# Patient Record
Sex: Female | Born: 1935 | Race: White | Hispanic: No | Smoking: Never smoker
Health system: Southern US, Community
[De-identification: ages and names within clinical notes are randomized; demographics above are authoritative.]

## PROBLEM LIST (undated history)

## (undated) DIAGNOSIS — E039 Hypothyroidism, unspecified: Secondary | ICD-10-CM

## (undated) DIAGNOSIS — H409 Unspecified glaucoma: Secondary | ICD-10-CM

## (undated) DIAGNOSIS — R112 Nausea with vomiting, unspecified: Secondary | ICD-10-CM

## (undated) DIAGNOSIS — Z923 Personal history of irradiation: Secondary | ICD-10-CM

## (undated) DIAGNOSIS — N3281 Overactive bladder: Secondary | ICD-10-CM

## (undated) DIAGNOSIS — E785 Hyperlipidemia, unspecified: Secondary | ICD-10-CM

## (undated) DIAGNOSIS — I10 Essential (primary) hypertension: Secondary | ICD-10-CM

## (undated) DIAGNOSIS — J189 Pneumonia, unspecified organism: Secondary | ICD-10-CM

## (undated) DIAGNOSIS — C44320 Squamous cell carcinoma of skin of unspecified parts of face: Secondary | ICD-10-CM

## (undated) DIAGNOSIS — N1831 Chronic kidney disease, stage 3a: Secondary | ICD-10-CM

## (undated) DIAGNOSIS — Z9889 Other specified postprocedural states: Secondary | ICD-10-CM

## (undated) DIAGNOSIS — C50919 Malignant neoplasm of unspecified site of unspecified female breast: Secondary | ICD-10-CM

## (undated) HISTORY — PX: CATARACT EXTRACTION W/ INTRAOCULAR LENS  IMPLANT, BILATERAL: SHX1307

## (undated) HISTORY — PX: BREAST SURGERY: SHX581

## (undated) HISTORY — PX: TOTAL ABDOMINAL HYSTERECTOMY: SHX209

## (undated) HISTORY — PX: ABDOMINAL HYSTERECTOMY: SHX81

## (undated) HISTORY — DX: Hyperlipidemia, unspecified: E78.5

## (undated) HISTORY — DX: Unspecified glaucoma: H40.9

## (undated) HISTORY — PX: SURGICAL EXCISION OF EXCESSIVE SKIN: SHX6542

## (undated) HISTORY — DX: Essential (primary) hypertension: I10

## (undated) HISTORY — DX: Overactive bladder: N32.81

## (undated) HISTORY — DX: Hypothyroidism, unspecified: E03.9

## (undated) HISTORY — DX: Squamous cell carcinoma of skin of unspecified parts of face: C44.320

---

## 1980-03-29 HISTORY — PX: PARTIAL HYSTERECTOMY: SHX80

## 1982-03-29 HISTORY — PX: THYROIDECTOMY, PARTIAL: SHX18

## 2010-03-05 ENCOUNTER — Ambulatory Visit: Payer: Self-pay | Admitting: Family Medicine

## 2011-02-05 ENCOUNTER — Ambulatory Visit: Payer: Self-pay | Admitting: Family Medicine

## 2011-03-08 ENCOUNTER — Ambulatory Visit: Payer: Self-pay | Admitting: Family Medicine

## 2012-03-08 ENCOUNTER — Ambulatory Visit: Payer: Self-pay | Admitting: Family Medicine

## 2013-02-08 ENCOUNTER — Emergency Department: Payer: Self-pay | Admitting: Emergency Medicine

## 2013-02-08 LAB — COMPREHENSIVE METABOLIC PANEL
Albumin: 3.7 g/dL (ref 3.4–5.0)
Alkaline Phosphatase: 75 U/L (ref 50–136)
Anion Gap: 7 (ref 7–16)
BUN: 15 mg/dL (ref 7–18)
Bilirubin,Total: 0.7 mg/dL (ref 0.2–1.0)
Chloride: 98 mmol/L (ref 98–107)
Co2: 27 mmol/L (ref 21–32)
Creatinine: 0.84 mg/dL (ref 0.60–1.30)
EGFR (African American): >60
EGFR (Non-African Amer.): >60
Glucose: 129 mg/dL — ABNORMAL HIGH (ref 65–99)
SGOT(AST): 22 U/L (ref 15–37)
SGPT (ALT): 20 U/L (ref 12–78)
Sodium: 132 mmol/L — ABNORMAL LOW (ref 136–145)

## 2013-02-08 LAB — URINALYSIS, COMPLETE
Bacteria: NONE SEEN
Bilirubin,UR: NEGATIVE
Glucose,UR: NEGATIVE mg/dL (ref 0–75)
Nitrite: NEGATIVE
Ph: 7 (ref 4.5–8.0)
Protein: 30
RBC,UR: 29 /HPF (ref 0–5)
Specific Gravity: 1.017 (ref 1.003–1.030)
Squamous Epithelial: 10

## 2013-02-08 LAB — CBC
HCT: 37.1 % (ref 35.0–47.0)
MCH: 30.2 pg (ref 26.0–34.0)
MCHC: 34.7 g/dL (ref 32.0–36.0)
MCV: 87 fL (ref 80–100)
Platelet: 322 10*3/uL (ref 150–440)
RBC: 4.26 10*6/uL (ref 3.80–5.20)
RDW: 13.4 % (ref 11.5–14.5)

## 2013-03-14 ENCOUNTER — Ambulatory Visit: Payer: Self-pay | Admitting: Family Medicine

## 2013-03-19 ENCOUNTER — Ambulatory Visit: Payer: Self-pay | Admitting: Unknown Physician Specialty

## 2013-10-02 ENCOUNTER — Ambulatory Visit: Payer: Self-pay | Admitting: Family Medicine

## 2014-04-05 ENCOUNTER — Ambulatory Visit: Payer: Self-pay | Admitting: Family Medicine

## 2014-09-06 ENCOUNTER — Other Ambulatory Visit: Payer: Self-pay | Admitting: Family Medicine

## 2014-09-10 ENCOUNTER — Encounter: Payer: Self-pay | Admitting: Obstetrics and Gynecology

## 2014-09-10 ENCOUNTER — Ambulatory Visit (INDEPENDENT_AMBULATORY_CARE_PROVIDER_SITE_OTHER): Payer: Commercial Managed Care - HMO | Admitting: Obstetrics and Gynecology

## 2014-09-10 VITALS — BP 184/79 | HR 66 | Ht 64.5 in | Wt 143.6 lb

## 2014-09-10 DIAGNOSIS — N9089 Other specified noninflammatory disorders of vulva and perineum: Secondary | ICD-10-CM

## 2014-09-10 NOTE — Patient Instructions (Signed)
Lichen Sclerosus Lichen sclerosus is a skin problem. It can happen on any part of the body, but it commonly involves the anal or genital areas. Lichen sclerosus is not an infection or a fungus. Girls and women are more commonly affected than boys and men. CAUSES The cause is not known. It could be the result of an overactive immune system or a lack of certain hormones. Lichen sclerosus is not passed from one person to another (not contagious). SYMPTOMS Your skin may have:  Thin, wrinkled, white areas.  Thickened white areas.  Red and swollen patches.  Tears or cracks.  Bruising.  Blood blisters.  Severe itching. You may also have pain, itching, or burning with urination. Constipation is also common in people with lichen sclerosus. DIAGNOSIS Your caregiver will do a physical exam. Sometimes, a tissue sample (biopsy) may be sent for testing. TREATMENT Treatment may involve putting a thin layer of medicated cream (topical steroid) over the areas with lichen sclerosus. Use the cream only as directed by your caregiver.  HOME CARE INSTRUCTIONS  Only take over-the-counter or prescription medicines as directed by your caregiver.  Keep the vaginal area as clean and dry as possible. SEEK MEDICAL CARE IF: You develop increasing pain, swelling, or redness. Document Released: 08/05/2010 Document Revised: 06/07/2011 Document Reviewed: 08/05/2010 Exit[employer] Patient Information 2015 [employer]. This information is not intended to replace advice given to you by your health care provider. Make sure you discuss any questions you have with your health care provider.  

## 2014-09-11 DIAGNOSIS — N9089 Other specified noninflammatory disorders of vulva and perineum: Secondary | ICD-10-CM | POA: Insufficient documentation

## 2014-09-11 NOTE — Progress Notes (Signed)
GYNECOLOGY PROGRESS NOTE   Subjective:    Patient ID: Cassie Brewer, female    DOB: 1929/03/17, 81 y.o.   MRN: [id number]  HPI Patient is an 81 y.o. Female who presents as a referral from [facility] for vulvar irritiation and labial fusion. 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 Had a full physical and noted labial fusion.  Followed up with patient ~ 1-2 months later with no improvement.  Also noted vulvar irritation at that time and was started on Clobetasol for possible lichen sclerosis (per pt).  Notes that irritation has improved some. Currently using Clobetasol 3 x daily. Denies vaginal dryness or irritation.    Past Medical History  Diagnosis Date  . Hypertension   . Thyroid disease    Past Surgical History  Procedure Laterality Date  . Thyroidectomy, partial Right   . Abdominal hysterectomy      Outpatient Encounter Prescriptions as of 09/10/2014  Medication Sig  . benazepril (LOTENSIN) 40 MG tablet Take 40 mg by mouth daily.  . clobetasol cream (TEMOVATE) 2.54 % Apply 1 application topically 2 (two) times daily.  . hydrochlorothiazide (HYDRODIURIL) 25 MG tablet Take 25 mg by mouth daily.  Marland Kitchen levothyroxine (SYNTHROID, LEVOTHROID) 112 MCG tablet Take 112 mcg by mouth daily before breakfast.   No facility-administered encounter medications on file as of 09/10/2014.   Allergies  Allergen Reactions  . Amoxicillin     All other history reviewed and updated as necessary.   Review of Systems  Negative except what's noted in HPI and GU Genito-Urinary ROS: no dysuria, trouble voiding, or hematuria positive for - incontinence and urinary urgency     Objective:   Physical Exam BP 184/79 mmHg  Pulse 66  Ht 5' 4.5" (1.638 m)  Wt 143 lb 9.6 oz (65.137 kg)  BMI 24.28 kg/m2  LMP 05/13/1980 General appearance: alert, appears stated age and no distress Back: symmetric, no curvature.  ROM normal. No CVA tenderness. Abdomen: soft, non-tender; bowel sounds normal; no masses,  no organomegaly Pelvic: Moderate vaginal and severe vulvar atrophy noted. Cystocele present (mild). Areas of mild erythema noted on labia majora.  rectovaginal septum normal, urethra without abnormality or discharge and uterus surgically absent.  No leakage of urine on Valsalva.  Extremities: extremities normal, atraumatic, no cyanosis or edema Skin: Skin color, texture, turgor normal. No rashes or lesions Lymph nodes: No localized lymphadenopathy Neurologic: Grossly normal     Assessment & Plan:  1. Vaginal irritation - Patient has been using Clobetasol for several months, with moderate improvement in symptoms.  To continue x 1 additional month.  Will f/u at that time.  2. Vaginal and vulvar atrophy - patient denies vaginal irritation, dryness.  Does report vulvar irritation.  May need treatment with local estrogen therapy after completion of Clobetasol.   No areas of fusion noted on today's exam.  3. Urinary incontinence - based on symptoms (occurs occasionally with symptoms of urge, as well as when coughing), appears to be mild mixed incontinence.  Denies nocturia.  Brief discussion had on management of symptoms, including Kegel's, pessary, medications, and surgery.  Given handout.  To discuss next visit.  4. RTC in 4-6 weeks.    Rubie Maid, MD [facility]

## 2014-10-02 DIAGNOSIS — N182 Chronic kidney disease, stage 2 (mild): Secondary | ICD-10-CM

## 2014-10-02 DIAGNOSIS — N183 Chronic kidney disease, stage 3 unspecified: Secondary | ICD-10-CM | POA: Insufficient documentation

## 2014-10-02 DIAGNOSIS — E039 Hypothyroidism, unspecified: Secondary | ICD-10-CM | POA: Insufficient documentation

## 2014-10-02 DIAGNOSIS — I10 Essential (primary) hypertension: Secondary | ICD-10-CM | POA: Insufficient documentation

## 2014-10-02 DIAGNOSIS — E785 Hyperlipidemia, unspecified: Secondary | ICD-10-CM | POA: Insufficient documentation

## 2014-10-02 DIAGNOSIS — C44320 Squamous cell carcinoma of skin of unspecified parts of face: Secondary | ICD-10-CM | POA: Insufficient documentation

## 2014-10-02 DIAGNOSIS — I129 Hypertensive chronic kidney disease with stage 1 through stage 4 chronic kidney disease, or unspecified chronic kidney disease: Secondary | ICD-10-CM | POA: Insufficient documentation

## 2014-10-02 HISTORY — DX: Hypothyroidism, unspecified: E03.9

## 2014-10-07 ENCOUNTER — Ambulatory Visit (INDEPENDENT_AMBULATORY_CARE_PROVIDER_SITE_OTHER): Payer: Commercial Managed Care - HMO | Admitting: Family Medicine

## 2014-10-07 ENCOUNTER — Encounter: Payer: Self-pay | Admitting: Family Medicine

## 2014-10-07 VITALS — BP 138/65 | HR 66 | Temp 98.3°F | Ht 63.2 in | Wt 140.0 lb

## 2014-10-07 DIAGNOSIS — E039 Hypothyroidism, unspecified: Secondary | ICD-10-CM

## 2014-10-07 DIAGNOSIS — I10 Essential (primary) hypertension: Secondary | ICD-10-CM | POA: Diagnosis not present

## 2014-10-07 DIAGNOSIS — E785 Hyperlipidemia, unspecified: Secondary | ICD-10-CM | POA: Diagnosis not present

## 2014-10-07 DIAGNOSIS — Z0000 Encounter for general adult medical examination without abnormal findings: Secondary | ICD-10-CM | POA: Diagnosis not present

## 2014-10-07 LAB — MICROSCOPIC EXAMINATION

## 2014-10-07 LAB — URINALYSIS, ROUTINE W REFLEX MICROSCOPIC
BILIRUBIN UA: NEGATIVE
Glucose, UA: NEGATIVE
Ketones, UA: NEGATIVE
Nitrite, UA: NEGATIVE
PH UA: 8.5 — AB (ref 5.0–7.5)
Protein, UA: NEGATIVE
SPEC GRAV UA: 1.015 (ref 1.005–1.030)
UUROB: 0.2 mg/dL (ref 0.2–1.0)

## 2014-10-07 MED ORDER — HYDROCHLOROTHIAZIDE 25 MG PO TABS: 25.0000 mg | ORAL_TABLET | Freq: Every day | ORAL | Status: DC

## 2014-10-07 MED ORDER — LOVASTATIN 20 MG PO TABS: 20.0000 mg | ORAL_TABLET | Freq: Every day | ORAL | Status: DC

## 2014-10-07 MED ORDER — BENAZEPRIL HCL 40 MG PO TABS: 40.0000 mg | ORAL_TABLET | Freq: Every day | ORAL | Status: DC

## 2014-10-07 MED ORDER — LEVOTHYROXINE SODIUM 112 MCG PO TABS: 112.0000 ug | ORAL_TABLET | Freq: Every day | ORAL | Status: DC

## 2014-10-07 NOTE — Assessment & Plan Note (Signed)
The current medical regimen is effective;  continue present plan and medications.  

## 2014-10-07 NOTE — Assessment & Plan Note (Signed)
The current medical regimen is effective;  continue present plan and medications. Discuss cont checking at home BPand if not staying good need recheck

## 2014-10-07 NOTE — Progress Notes (Signed)
BP 138/65 mmHg  Pulse 66  Temp(Src) 98.3 F (36.8 C)  Ht 5' 3.2" (1.605 m)  Wt 140 lb (63.504 kg)  BMI 24.65 kg/m2  SpO2 96%  LMP 05/13/1980   Subjective:    Patient ID: Cassie Brewer, female    DOB: [birth year], 81 y.o.   MRN: [id number]  HPI: Cassie Brewer is a 81 y.o. female  Chief Complaint  Patient presents with  . Annual Exam  HTN, thyroid, and lipids meds stable No side effects Takes every day BP at GYN also up Checking at home 130-120s No dizzyness  Having GYN w/u for atrophic vaginitis Using a steroid cream  No breast exam   Relevant past medical, surgical, family and social history reviewed and updated as indicated. Interim medical history since our last visit reviewed. Allergies and medications reviewed and updated.  Review of Systems  Constitutional: Negative.   HENT: Negative.   Eyes: Negative.   Respiratory: Negative.   Cardiovascular: Negative.   Gastrointestinal: Negative.   Endocrine: Negative.   Genitourinary: Negative.   Musculoskeletal: Negative.   Skin: Negative.   Allergic/Immunologic: Negative.   Neurological: Negative.   Hematological: Negative.   Psychiatric/Behavioral: Negative.     Per HPI unless specifically indicated above     Objective:    BP 138/65 mmHg  Pulse 66  Temp(Src) 98.3 F (36.8 C)  Ht 5' 3.2" (1.605 m)  Wt 140 lb (63.504 kg)  BMI 24.65 kg/m2  SpO2 96%  LMP 05/13/1980  Wt Readings from Last 3 Encounters:  10/07/14 140 lb (63.504 kg)  03/13/14 138 lb (62.596 kg)  09/10/14 143 lb 9.6 oz (65.137 kg)    Physical Exam  Constitutional: She is oriented to person, place, and time. She appears well-developed and well-nourished.  HENT:  Head: Normocephalic and atraumatic.  Right Ear: External ear normal.  Left Ear: External ear normal.  Nose: Nose normal.  Mouth/Throat: Oropharynx is clear and moist.  Eyes: Conjunctivae and EOM are normal. Pupils are equal, round, and reactive to light.  Neck: Normal  range of motion. Neck supple. Carotid bruit is not present.  Cardiovascular: Normal rate, regular rhythm and normal heart sounds.   No murmur heard. Pulmonary/Chest: Effort normal and breath sounds normal. Right breast exhibits no mass and no tenderness. Left breast exhibits no mass and no tenderness. Breasts are symmetrical.  Abdominal: Soft. Bowel sounds are normal. There is no hepatosplenomegaly.  Musculoskeletal: Normal range of motion.  Neurological: She is alert and oriented to person, place, and time.  Skin: No rash noted.  Psychiatric: She has a normal mood and affect. Her behavior is normal. Judgment and thought content normal.        Assessment & Plan:   Problem List Items Addressed This Visit      Cardiovascular and Mediastinum   Hypertension - Primary    The current medical regimen is effective;  continue present plan and medications. Discuss cont checking at home BPand if not staying good need recheck      Relevant Medications   benazepril (LOTENSIN) 40 MG tablet   hydrochlorothiazide (HYDRODIURIL) 25 MG tablet   lovastatin (MEVACOR) 20 MG tablet   Other Relevant Orders   Comprehensive metabolic panel   CBC with Differential/Platelet   Urinalysis, Routine w reflex microscopic (not at [facility])     Endocrine   Hypothyroidism    The current medical regimen is effective;  continue present plan and medications.       Relevant Medications  levothyroxine (SYNTHROID, LEVOTHROID) 112 MCG tablet   Other Relevant Orders   Comprehensive metabolic panel   CBC with Differential/Platelet   Urinalysis, Routine w reflex microscopic (not at [facility])   TSH     Other   Hyperlipidemia    The current medical regimen is effective;  continue present plan and medications.       Relevant Medications   benazepril (LOTENSIN) 40 MG tablet   hydrochlorothiazide (HYDRODIURIL) 25 MG tablet   lovastatin (MEVACOR) 20 MG tablet   Other Relevant Orders   Comprehensive metabolic panel    CBC with Differential/Platelet   Urinalysis, Routine w reflex microscopic (not at [facility])   Lipid panel    Other Visit Diagnoses    PE (physical exam), annual        Relevant Orders    Comprehensive metabolic panel    CBC with Differential/Platelet    Urinalysis, Routine w reflex microscopic (not at [facility])        Follow up plan: Return in about 6 months (around 04/09/2015), or if symptoms worsen or fail to improve, for BP lipids check  BMP, alt, ast, lipids.

## 2014-10-08 LAB — COMPREHENSIVE METABOLIC PANEL
ALBUMIN: 4.4 g/dL (ref 3.5–4.7)
ALK PHOS: 49 IU/L (ref 39–117)
ALT: 13 IU/L (ref 0–32)
AST: 14 IU/L (ref 0–40)
Albumin/Globulin Ratio: 1.8 (ref 1.1–2.5)
BUN/Creatinine Ratio: 16 (ref 11–26)
BUN: 15 mg/dL (ref 8–27)
Bilirubin Total: 0.5 mg/dL (ref 0.0–1.2)
CALCIUM: 9.6 mg/dL (ref 8.7–10.3)
CO2: 24 mmol/L (ref 18–29)
Chloride: 98 mmol/L (ref 97–108)
Creatinine, Ser: 0.95 mg/dL (ref 0.57–1.00)
GFR calc Af Amer: 64 mL/min/{1.73_m2} (ref >59)
GFR calc non Af Amer: 56 mL/min/{1.73_m2} — ABNORMAL LOW (ref >59)
GLOBULIN, TOTAL: 2.4 g/dL (ref 1.5–4.5)
GLUCOSE: 105 mg/dL — AB (ref 65–99)
POTASSIUM: 4.4 mmol/L (ref 3.5–5.2)
Sodium: 140 mmol/L (ref 134–144)
Total Protein: 6.8 g/dL (ref 6.0–8.5)

## 2014-10-08 LAB — CBC WITH DIFFERENTIAL/PLATELET
BASOS ABS: 0.1 10*3/uL (ref 0.0–0.2)
Basos: 1 %
EOS (ABSOLUTE): 0.1 10*3/uL (ref 0.0–0.4)
Eos: 2 %
Hematocrit: 35.7 % (ref 34.0–46.6)
Hemoglobin: 12.7 g/dL (ref 11.1–15.9)
IMMATURE GRANS (ABS): 0 10*3/uL (ref 0.0–0.1)
Immature Granulocytes: 0 %
LYMPHS: 30 %
Lymphocytes Absolute: 1.4 10*3/uL (ref 0.7–3.1)
MCH: 29.7 pg (ref 26.6–33.0)
MCHC: 35.6 g/dL (ref 31.5–35.7)
MCV: 84 fL (ref 79–97)
MONOS ABS: 0.4 10*3/uL (ref 0.1–0.9)
Monocytes: 10 %
NEUTROS ABS: 2.6 10*3/uL (ref 1.4–7.0)
NEUTROS PCT: 57 %
PLATELETS: 296 10*3/uL (ref 150–379)
RBC: 4.27 x10E6/uL (ref 3.77–5.28)
RDW: 13.7 % (ref 12.3–15.4)
WBC: 4.6 10*3/uL (ref 3.4–10.8)

## 2014-10-08 LAB — LIPID PANEL
CHOL/HDL RATIO: 2.7 ratio (ref 0.0–4.4)
Cholesterol, Total: 211 mg/dL — ABNORMAL HIGH (ref 100–199)
HDL: 79 mg/dL (ref >39)
LDL Calculated: 120 mg/dL — ABNORMAL HIGH (ref 0–99)
Triglycerides: 61 mg/dL (ref 0–149)
VLDL CHOLESTEROL CAL: 12 mg/dL (ref 5–40)

## 2014-10-08 LAB — TSH: TSH: 1.18 u[IU]/mL (ref 0.450–4.500)

## 2014-10-17 ENCOUNTER — Telehealth: Payer: Self-pay

## 2014-10-17 NOTE — Telephone Encounter (Signed)
Patient called, she was concerned about her cholesterol results. She was confused with what HDL and LDL was, I explained it to her, she said that she has been told for years the opposite. She states that she will research it and thanked me for my help.

## 2014-10-17 NOTE — Telephone Encounter (Signed)
Received a message from patient concerning some of her labs results. Called and LVM for her to return my call if she still has questions.

## 2014-10-22 ENCOUNTER — Ambulatory Visit (INDEPENDENT_AMBULATORY_CARE_PROVIDER_SITE_OTHER): Payer: Commercial Managed Care - HMO | Admitting: Obstetrics and Gynecology

## 2014-10-22 ENCOUNTER — Encounter: Payer: Self-pay | Admitting: Obstetrics and Gynecology

## 2014-10-22 VITALS — BP 157/75 | HR 71 | Ht 64.0 in | Wt 141.2 lb

## 2014-10-22 DIAGNOSIS — N9089 Other specified noninflammatory disorders of vulva and perineum: Secondary | ICD-10-CM

## 2014-10-22 DIAGNOSIS — N905 Atrophy of vulva: Secondary | ICD-10-CM

## 2014-10-22 MED ORDER — ESTROGENS, CONJUGATED 0.625 MG/GM VA CREA: 1.0000 | TOPICAL_CREAM | Freq: Every day | VAGINAL | Status: DC

## 2014-10-23 NOTE — Progress Notes (Signed)
GYNECOLOGY PROGRESS NOTE   Subjective:    Patient ID: Cassie Brewer, female    DOB: 08/29/1928, 81 y.o.   MRN: [id number]  HPI Patient is an 81 y.o. G23P2002 female who presents for f/u of vulvar irritiation and labial fusion. Notes that she has continued taking the Clobetasol as prescribed.  Denies symptoms currently.     All other history reviewed and updated as necessary.   Review of Systems  Negative except what's noted in HPI and GU Genito-Urinary ROS: no dysuria, trouble voiding, or hematuria positive for - incontinence and urinary urgency     Objective:   Physical Exam BP 157/75 mmHg  Pulse 71  Ht 5\' 4"  (1.626 m)  Wt 141 lb 3.2 oz (64.048 kg)  BMI 24.23 kg/m2  LMP 05/13/1980 General appearance: alert, appears stated age and no distress Pelvic: Moderate vaginal and severe vulvar atrophy noted. Cystocele present (mild). Areas of mild erythema noted on labia majora.  rectovaginal septum normal, urethra without abnormality or discharge and uterus surgically absent.  No leakage of urine on Valsalva.  Extremities: extremities normal, atraumatic, no cyanosis or edema     Assessment & Plan:  1. Vaginal irritation - Patient has been using Clobetasol for several months, with moderate improvement in symptoms. Has completed 3 months of therapy.  2. Vaginal and vulvar atrophy - patient denies vaginal irritation, dryness.  Does report vulvar irritation.  Will treat with estrogen cream now to prevent any worsening labial fusion. Able to digitalize vaginal canal with 2 fingers today.  3. Urinary incontinence - based on symptoms (occurs occasionally with symptoms of urge, as well as when coughing), appears to be mild mixed incontinence.  Denies nocturia.  Brief discussion had on management of symptoms, including Kegel's, pessary, medications, and surgery at last visit.  Still unsure. To discuss next visit.  To f/u in 1 month.     Rubie Maid, MD [facility]

## 2014-11-19 ENCOUNTER — Encounter: Payer: Self-pay | Admitting: Obstetrics and Gynecology

## 2014-11-19 ENCOUNTER — Ambulatory Visit (INDEPENDENT_AMBULATORY_CARE_PROVIDER_SITE_OTHER): Payer: Commercial Managed Care - HMO | Admitting: Obstetrics and Gynecology

## 2014-11-19 VITALS — BP 139/79 | HR 67 | Resp 16 | Ht 64.0 in | Wt 142.1 lb

## 2014-11-19 DIAGNOSIS — N952 Postmenopausal atrophic vaginitis: Secondary | ICD-10-CM | POA: Diagnosis not present

## 2014-11-19 NOTE — Progress Notes (Signed)
GYNECOLOGY PROGRESS NOTE  Subjective:    Patient ID: Cassie Brewer, female    DOB: [birth year], 81 y.o.   MRN: [id number]  HPI  Patient is a 81 y.o. G25P2002 female who presents for f/u of vaginal atrophy.  Has been using Premarin cream as prescribed, twice weekly.  Denies complaints.   The following portions of the patient's history were reviewed and updated as appropriate: allergies, current medications, past family history, past medical history, past social history, past surgical history and problem list.  Review of Systems A comprehensive review of systems was negative.   Objective:   Blood pressure 139/79, pulse 67, resp. rate 16, height 5\' 4"  (1.626 m), weight 142 lb 1.6 oz (64.456 kg), last menstrual period 05/13/1980. General appearance: alert and no distress Pelvic: external genitalia mildly atrophic (improved since last visit), puffy but nontender.  Vaginal mucosa mildly atrophic (improved since last visit).  Rectocele present (2nd degree).  Uterus and cervix surgically absent.  No adnexal tenderness or masses.  Extremities: extremities normal, atraumatic, no cyanosis or edema Neurologic: Grossly normal   Assessment:   Vaginal atrophy  Plan:   Patient concerned about possible labial agglutination and not being able to urinate, reassurance given that labia were well separated, should not cause obstruction of urine.  Continue using Premarin cream twice weekly.  Needs refill on Premarin.  Will order.   RTC in 6 months.   Rubie Maid, MD [facility]

## 2015-01-21 ENCOUNTER — Ambulatory Visit: Payer: Commercial Managed Care - HMO

## 2015-01-21 DIAGNOSIS — Z23 Encounter for immunization: Secondary | ICD-10-CM

## 2015-02-05 ENCOUNTER — Encounter: Payer: Self-pay | Admitting: Family Medicine

## 2015-02-14 ENCOUNTER — Encounter: Payer: Self-pay | Admitting: Family Medicine

## 2015-04-09 ENCOUNTER — Ambulatory Visit: Payer: Commercial Managed Care - HMO | Admitting: Family Medicine

## 2015-04-22 DIAGNOSIS — H40019 Open angle with borderline findings, low risk, unspecified eye: Secondary | ICD-10-CM | POA: Diagnosis not present

## 2015-04-24 ENCOUNTER — Ambulatory Visit (INDEPENDENT_AMBULATORY_CARE_PROVIDER_SITE_OTHER): Payer: PPO | Admitting: Family Medicine

## 2015-04-24 ENCOUNTER — Encounter: Payer: Self-pay | Admitting: Family Medicine

## 2015-04-24 VITALS — BP 110/69 | HR 72 | Temp 97.9°F | Ht 63.4 in | Wt 144.0 lb

## 2015-04-24 DIAGNOSIS — E785 Hyperlipidemia, unspecified: Secondary | ICD-10-CM

## 2015-04-24 DIAGNOSIS — E038 Other specified hypothyroidism: Secondary | ICD-10-CM | POA: Diagnosis not present

## 2015-04-24 DIAGNOSIS — M129 Arthropathy, unspecified: Secondary | ICD-10-CM | POA: Diagnosis not present

## 2015-04-24 DIAGNOSIS — M19079 Primary osteoarthritis, unspecified ankle and foot: Secondary | ICD-10-CM

## 2015-04-24 DIAGNOSIS — I10 Essential (primary) hypertension: Secondary | ICD-10-CM | POA: Diagnosis not present

## 2015-04-24 LAB — LP+ALT+AST PICCOLO, WAIVED
ALT (SGPT) Piccolo, Waived: 18 U/L (ref 10–47)
AST (SGOT) Piccolo, Waived: 33 U/L (ref 11–38)
CHOLESTEROL PICCOLO, WAIVED: 208 mg/dL — AB (ref <200)
Chol/HDL Ratio Piccolo,Waive: 2.6 mg/dL
HDL Chol Piccolo, Waived: 81 mg/dL (ref >59)
LDL Chol Calc Piccolo Waived: 108 mg/dL — ABNORMAL HIGH (ref <100)
Triglycerides Piccolo,Waived: 90 mg/dL (ref <150)
VLDL CHOL CALC PICCOLO,WAIVE: 18 mg/dL (ref <30)

## 2015-04-24 NOTE — Assessment & Plan Note (Signed)
The current medical regimen is effective;  continue present plan and medications.  

## 2015-04-24 NOTE — Assessment & Plan Note (Signed)
Discuss foot care shoes limited use of Advil and Aleve use more Tylenol

## 2015-04-24 NOTE — Progress Notes (Signed)
BP 110/69 mmHg  Pulse 72  Temp(Src) 97.9 F (36.6 C)  Ht 5' 3.4" (1.61 m)  Wt 144 lb (65.318 kg)  BMI 25.20 kg/m2  SpO2 96%  LMP 05/13/1980   Subjective:    Patient ID: Cassie Brewer, female    DOB: May 18, 1928, 82 y.o.   MRN: [id number]  HPI: Cassie Brewer is a 82 y.o. female  Chief Complaint  Patient presents with  . Hyperlipidemia  . Hypertension  . Foot Pain    bilateral  . Alopecia   patient with bilateral foot pain worse after playing badminton especially bad sometimes at night has changed shoes which is helped some No rednessif the tenderness More painful in the bottoms of her foot at the metatarsal area Patient also concerned about some hair loss reviewed labs as noted below with normal thyroid Taking blood pressure cholesterol medicine thyroid without problems or side effects and with good control  Relevant past medical, surgical, family and social history reviewed and updated as indicated. Interim medical history since our last visit reviewed. Allergies and medications reviewed and updated.  Review of Systems  Constitutional: Negative.   Respiratory: Negative.   Cardiovascular: Negative.     Per HPI unless specifically indicated above     Objective:    BP 110/69 mmHg  Pulse 72  Temp(Src) 97.9 F (36.6 C)  Ht 5' 3.4" (1.61 m)  Wt 144 lb (65.318 kg)  BMI 25.20 kg/m2  SpO2 96%  LMP 05/13/1980  Wt Readings from Last 3 Encounters:  04/24/15 144 lb (65.318 kg)  11/19/14 142 lb 1.6 oz (64.456 kg)  10/22/14 141 lb 3.2 oz (64.048 kg)    Physical Exam  Constitutional: She is oriented to person, place, and time. She appears well-developed and well-nourished. No distress.  HENT:  Head: Normocephalic and atraumatic.  Right Ear: Hearing normal.  Left Ear: Hearing normal.  Nose: Nose normal.  Eyes: Conjunctivae and lids are normal. Right eye exhibits no discharge. Left eye exhibits no discharge. No scleral icterus.  Cardiovascular: Normal rate, regular  rhythm and normal heart sounds.   Pulmonary/Chest: Effort normal and breath sounds normal. No respiratory distress.  Musculoskeletal: Normal range of motion.  Right foot with callus on bunion otherwise normal exam of both feet  Neurological: She is alert and oriented to person, place, and time.  Skin: Skin is intact. No rash noted.  Psychiatric: She has a normal mood and affect. Her speech is normal and behavior is normal. Judgment and thought content normal. Cognition and memory are normal.    Results for orders placed or performed in visit on 10/07/14  Microscopic Examination  Result Value Ref Range   WBC, UA 0-5 0 -  5 /hpf   RBC, UA 0-2 0 -  2 /hpf   Epithelial Cells (non renal) 0-10 0 - 10 /hpf   Bacteria, UA Few None seen/Few  Comprehensive metabolic panel  Result Value Ref Range   Glucose 105 (H) 65 - 99 mg/dL   BUN 15 8 - 27 mg/dL   Creatinine, Ser 0.95 0.57 - 1.00 mg/dL   GFR calc non Af Amer 56 (L) >59 mL/min/1.73   GFR calc Af Amer 64 >59 mL/min/1.73   BUN/Creatinine Ratio 16 11 - 26   Sodium 140 134 - 144 mmol/L   Potassium 4.4 3.5 - 5.2 mmol/L   Chloride 98 97 - 108 mmol/L   CO2 24 18 - 29 mmol/L   Calcium 9.6 8.7 - 10.3 mg/dL  Total Protein 6.8 6.0 - 8.5 g/dL   Albumin 4.4 3.5 - 4.7 g/dL   Globulin, Total 2.4 1.5 - 4.5 g/dL   Albumin/Globulin Ratio 1.8 1.1 - 2.5   Bilirubin Total 0.5 0.0 - 1.2 mg/dL   Alkaline Phosphatase 49 39 - 117 IU/L   AST 14 0 - 40 IU/L   ALT 13 0 - 32 IU/L  CBC with Differential/Platelet  Result Value Ref Range   WBC 4.6 3.4 - 10.8 x10E3/uL   RBC 4.27 3.77 - 5.28 x10E6/uL   Hemoglobin 12.7 11.1 - 15.9 g/dL   Hematocrit 35.7 34.0 - 46.6 %   MCV 84 79 - 97 fL   MCH 29.7 26.6 - 33.0 pg   MCHC 35.6 31.5 - 35.7 g/dL   RDW 13.7 12.3 - 15.4 %   Platelets 296 150 - 379 x10E3/uL   Neutrophils 57 %   Lymphs 30 %   Monocytes 10 %   Eos 2 %   Basos 1 %   Neutrophils Absolute 2.6 1.4 - 7.0 x10E3/uL   Lymphocytes Absolute 1.4 0.7 - 3.1  x10E3/uL   Monocytes Absolute 0.4 0.1 - 0.9 x10E3/uL   EOS (ABSOLUTE) 0.1 0.0 - 0.4 x10E3/uL   Basophils Absolute 0.1 0.0 - 0.2 x10E3/uL   Immature Granulocytes 0 %   Immature Grans (Abs) 0.0 0.0 - 0.1 x10E3/uL  Urinalysis, Routine w reflex microscopic (not at [facility])  Result Value Ref Range   Specific Gravity, UA 1.015 1.005 - 1.030   pH, UA 8.5 (H) 5.0 - 7.5   Color, UA Yellow Yellow   Appearance Ur Clear Clear   Leukocytes, UA 1+ (A) Negative   Protein, UA Negative Negative/Trace   Glucose, UA Negative Negative   Ketones, UA Negative Negative   RBC, UA Trace (A) Negative   Bilirubin, UA Negative Negative   Urobilinogen, Ur 0.2 0.2 - 1.0 mg/dL   Nitrite, UA Negative Negative   Microscopic Examination See below:   TSH  Result Value Ref Range   TSH 1.180 0.450 - 4.500 uIU/mL  Lipid panel  Result Value Ref Range   Cholesterol, Total 211 (H) 100 - 199 mg/dL   Triglycerides 61 0 - 149 mg/dL   HDL 79 >39 mg/dL   VLDL Cholesterol Cal 12 5 - 40 mg/dL   LDL Calculated 120 (H) 0 - 99 mg/dL   Chol/HDL Ratio 2.7 0.0 - 4.4 ratio units      Assessment & Plan:   Problem List Items Addressed This Visit      Cardiovascular and Mediastinum   Hypertension    The current medical regimen is effective;  continue present plan and medications.         Endocrine   Hypothyroidism    The current medical regimen is effective;  continue present plan and medications.         Musculoskeletal and Integument   Arthritis of foot    Discuss foot care shoes limited use of Advil and Aleve use more Tylenol        Other   Hyperlipidemia    The current medical regimen is effective;  continue present plan and medications.        Other Visit Diagnoses    Essential hypertension, benign    -  Primary    Relevant Orders    LP+ALT+AST Piccolo, Waived    Basic metabolic panel    Hyperlipemia        Relevant Orders    LP+ALT+AST Hoffman, Waived  Basic metabolic panel        Follow up  plan: Return in about 6 months (around 10/22/2015), or if symptoms worsen or fail to improve, for Physical Exam.

## 2015-04-25 LAB — BASIC METABOLIC PANEL
BUN/Creatinine Ratio: 21 (ref 11–26)
BUN: 22 mg/dL (ref 8–27)
CHLORIDE: 98 mmol/L (ref 96–106)
CO2: 24 mmol/L (ref 18–29)
CREATININE: 1.04 mg/dL — AB (ref 0.57–1.00)
Calcium: 9.7 mg/dL (ref 8.7–10.3)
GFR, EST AFRICAN AMERICAN: 57 mL/min/{1.73_m2} — AB (ref >59)
GFR, EST NON AFRICAN AMERICAN: 49 mL/min/{1.73_m2} — AB (ref >59)
Glucose: 92 mg/dL (ref 65–99)
Potassium: 4.1 mmol/L (ref 3.5–5.2)
SODIUM: 138 mmol/L (ref 134–144)

## 2015-04-28 ENCOUNTER — Telehealth: Payer: Self-pay | Admitting: Family Medicine

## 2015-04-28 ENCOUNTER — Encounter: Payer: Self-pay | Admitting: Family Medicine

## 2015-04-28 NOTE — Telephone Encounter (Signed)
Phone call Discussed with patient CK D slightly worse will stop aspirin recheck kidney function next office visit.

## 2015-04-28 NOTE — Telephone Encounter (Signed)
-----   Message from [place] sent at 04/28/2015  4:52 PM EST ----- lab

## 2015-04-28 NOTE — Telephone Encounter (Signed)
Phone call Patient doing well had switched his medicine was taking gabapentin instead of Nexium ER placed him on Protonix and is doing real well with full resolution of his symptoms continue medications

## 2015-04-28 NOTE — Telephone Encounter (Signed)
-----   Message from [place] sent at 04/28/2015  4:52 PM EST ----- lab

## 2015-05-06 ENCOUNTER — Telehealth: Payer: Self-pay | Admitting: Family Medicine

## 2015-05-06 DIAGNOSIS — E785 Hyperlipidemia, unspecified: Secondary | ICD-10-CM

## 2015-05-06 DIAGNOSIS — I10 Essential (primary) hypertension: Secondary | ICD-10-CM

## 2015-05-06 DIAGNOSIS — E039 Hypothyroidism, unspecified: Secondary | ICD-10-CM

## 2015-05-06 NOTE — Telephone Encounter (Signed)
Pt called stated she has changed insurance companies. All RX's need to be sent to [facility] in [place] for a 90 day supply. Please specify 90 day supply. Thanks.

## 2015-05-07 MED ORDER — LOVASTATIN 20 MG PO TABS: 20.0000 mg | ORAL_TABLET | Freq: Every day | ORAL | Status: DC

## 2015-05-07 MED ORDER — ESTROGENS, CONJUGATED 0.625 MG/GM VA CREA: 1.0000 | TOPICAL_CREAM | Freq: Every day | VAGINAL | Status: DC

## 2015-05-07 MED ORDER — BENAZEPRIL HCL 40 MG PO TABS: 40.0000 mg | ORAL_TABLET | Freq: Every day | ORAL | Status: DC

## 2015-05-07 MED ORDER — HYDROCHLOROTHIAZIDE 25 MG PO TABS: 25.0000 mg | ORAL_TABLET | Freq: Every day | ORAL | Status: DC

## 2015-05-07 MED ORDER — LEVOTHYROXINE SODIUM 112 MCG PO TABS: 112.0000 ug | ORAL_TABLET | Freq: Every day | ORAL | Status: DC

## 2015-05-15 DIAGNOSIS — M722 Plantar fascial fibromatosis: Secondary | ICD-10-CM | POA: Diagnosis not present

## 2015-05-15 DIAGNOSIS — M2011 Hallux valgus (acquired), right foot: Secondary | ICD-10-CM | POA: Diagnosis not present

## 2015-05-15 DIAGNOSIS — M659 Synovitis and tenosynovitis, unspecified: Secondary | ICD-10-CM | POA: Diagnosis not present

## 2015-05-22 ENCOUNTER — Encounter: Payer: Self-pay | Admitting: Obstetrics and Gynecology

## 2015-05-22 ENCOUNTER — Ambulatory Visit (INDEPENDENT_AMBULATORY_CARE_PROVIDER_SITE_OTHER): Payer: PPO | Admitting: Obstetrics and Gynecology

## 2015-05-22 VITALS — BP 131/81 | HR 78 | Ht 64.0 in | Wt 141.9 lb

## 2015-05-22 DIAGNOSIS — L280 Lichen simplex chronicus: Secondary | ICD-10-CM | POA: Diagnosis not present

## 2015-05-22 DIAGNOSIS — N952 Postmenopausal atrophic vaginitis: Secondary | ICD-10-CM

## 2015-05-22 NOTE — Progress Notes (Signed)
GYNECOLOGY PROGRESS NOTE  Subjective:    Patient ID: Cassie Brewer, female    DOB: 06-23-1928, 82 y.o.   MRN: [id number]  HPI  Patient is a 82 y.o. G88P2002 female who presents for f/u of vaginal atrophy.  Has been using Premarin cream as prescribed, twice weekly.  Denies complaints. Notes being seen by her Dermatologist who was concerned regarding some swelling of the labia and possible obstruction of urine flow. Also noted a generalized white area on labia minora. Patient denies any itching, pain associated.   The following portions of the patient's history were reviewed and updated as appropriate: allergies, current medications, past family history, past medical history, past social history, past surgical history and problem list.  Review of Systems Pertinent items noted in HPI and remainder of comprehensive ROS otherwise negative.   Objective:   Blood pressure 131/81, pulse 78, height 5\' 4"  (1.626 m), weight 141 lb 14.4 oz (64.365 kg), last menstrual period 05/13/1980. General appearance: alert and no distress Pelvic: external genitalia mildly atrophic (improved since last visit), puffy but nontender.  Small area of possible lichenifcation over the left labia minora. Vaginal mucosa mildly atrophic (improved since last visit).  Rectocele present (2nd degree).  Uterus and cervix surgically absent.  No adnexal tenderness or masses.  Extremities: extremities normal, atraumatic, no cyanosis or edema Neurologic: Grossly normal   Assessment:   Vaginal atrophy Possible lichenification  Plan:   Patient concerned about possible labial agglutination and not being able to urinate, reassurance given that labia were well still well separated, should not cause obstruction of urine.  Continue using Premarin cream twice weekly.  Continue to monitor site of possible lichenification.  If no resolution with Premarin by next visit, will take biopsy.  Currently asymptomatic.  RTC in 3 months.    Rubie Maid, MD [facility]

## 2015-06-12 DIAGNOSIS — M76822 Posterior tibial tendinitis, left leg: Secondary | ICD-10-CM | POA: Diagnosis not present

## 2015-06-12 DIAGNOSIS — M722 Plantar fascial fibromatosis: Secondary | ICD-10-CM | POA: Diagnosis not present

## 2015-07-28 ENCOUNTER — Ambulatory Visit (INDEPENDENT_AMBULATORY_CARE_PROVIDER_SITE_OTHER): Payer: PPO | Admitting: Family Medicine

## 2015-07-28 ENCOUNTER — Encounter: Payer: Self-pay | Admitting: Family Medicine

## 2015-07-28 VITALS — BP 133/73 | HR 81 | Temp 97.7°F | Ht 63.6 in | Wt 143.0 lb

## 2015-07-28 DIAGNOSIS — N183 Chronic kidney disease, stage 3 unspecified: Secondary | ICD-10-CM

## 2015-07-28 DIAGNOSIS — M19079 Primary osteoarthritis, unspecified ankle and foot: Secondary | ICD-10-CM

## 2015-07-28 DIAGNOSIS — M129 Arthropathy, unspecified: Secondary | ICD-10-CM | POA: Diagnosis not present

## 2015-07-28 NOTE — Assessment & Plan Note (Signed)
Discuss use of nonsteroidals and caution

## 2015-07-28 NOTE — Assessment & Plan Note (Signed)
Discuss arthritis care and treatment patient will go back to Dr. Caryl Comes to review of her treatments consider x-ray. Will consider lidocaine over-the-counter patches for nighttime use may use an occasional rare Advil or Aleve after playing badminton if needed

## 2015-07-28 NOTE — Progress Notes (Signed)
BP 133/73 mmHg  Pulse 81  Temp(Src) 97.7 F (36.5 C)  Ht 5' 3.6" (1.615 m)  Wt 143 lb (64.864 kg)  BMI 24.87 kg/m2  SpO2 98%  LMP 05/13/1980   Subjective:    Patient ID: Cassie Brewer, female    DOB: 1928/04/19, 82 y.o.   MRN: [id number]  HPI: Cassie Brewer is a 82 y.o. female  Chief Complaint  Patient presents with  . Foot Pain    bilateral  pt with continued arthritis foot pains been to Dr. Caryl Comes with his braces which haven't helped much patient's tried different shoes. Has really bothered her twice a week badminton playing pains more at night not with first step Feet stiff when she first moves after sitting a while but no heel pain of plantar fascia.  Not using Advil and Aleve or other nonsteroidal meds due to CK D stage III Takes occasional Tylenol which seems to help some. Braces seem to make feet hurt worse after playing badminton   Relevant past medical, surgical, family and social history reviewed and updated as indicated. Interim medical history since our last visit reviewed. Allergies and medications reviewed and updated.  Review of Systems  Constitutional: Negative.   Respiratory: Negative.   Cardiovascular: Negative.     Per HPI unless specifically indicated above     Objective:    BP 133/73 mmHg  Pulse 81  Temp(Src) 97.7 F (36.5 C)  Ht 5' 3.6" (1.615 m)  Wt 143 lb (64.864 kg)  BMI 24.87 kg/m2  SpO2 98%  LMP 05/13/1980  Wt Readings from Last 3 Encounters:  07/28/15 143 lb (64.864 kg)  05/22/15 141 lb 14.4 oz (64.365 kg)  04/24/15 144 lb (65.318 kg)    Physical Exam  Constitutional: She is oriented to person, place, and time. She appears well-developed and well-nourished. No distress.  HENT:  Head: Normocephalic and atraumatic.  Right Ear: Hearing normal.  Left Ear: Hearing normal.  Nose: Nose normal.  Eyes: Conjunctivae and lids are normal. Right eye exhibits no discharge. Left eye exhibits no discharge. No scleral icterus.   Pulmonary/Chest: Effort normal. No respiratory distress.  Musculoskeletal: Normal range of motion.  Feet with tenderness along the first metatarsal no joint abnormality no redness of gout or tenderness  Neurological: She is alert and oriented to person, place, and time.  Skin: Skin is intact. No rash noted.  Psychiatric: She has a normal mood and affect. Her speech is normal and behavior is normal. Judgment and thought content normal. Cognition and memory are normal.    Results for orders placed or performed in visit on 04/24/15  LP+ALT+AST Piccolo, [employer]  Result Value Ref Range   ALT (SGPT) Piccolo, Waived 18 10 - 47 U/L   AST (SGOT) Piccolo, Waived 33 11 - 38 U/L   Cholesterol Piccolo, Waived 208 (H) <200 mg/dL   HDL Chol Piccolo, Waived 81 >59 mg/dL   Triglycerides Piccolo,Waived 90 <150 mg/dL   Chol/HDL Ratio Piccolo,Waive 2.6 mg/dL   LDL Chol Calc Piccolo Waived 108 (H) <100 mg/dL   VLDL Chol Calc Piccolo,Waive 18 <30 mg/dL  Basic metabolic panel  Result Value Ref Range   Glucose 92 65 - 99 mg/dL   BUN 22 8 - 27 mg/dL   Creatinine, Ser 1.04 (H) 0.57 - 1.00 mg/dL   GFR calc non Af Amer 49 (L) >59 mL/min/1.73   GFR calc Af Amer 57 (L) >59 mL/min/1.73   BUN/Creatinine Ratio 21 11 - 26   Sodium  138 134 - 144 mmol/L   Potassium 4.1 3.5 - 5.2 mmol/L   Chloride 98 96 - 106 mmol/L   CO2 24 18 - 29 mmol/L   Calcium 9.7 8.7 - 10.3 mg/dL      Assessment & Plan:   Problem List Items Addressed This Visit      Musculoskeletal and Integument   Arthritis of foot - Primary    Discuss arthritis care and treatment patient will go back to Dr. Caryl Comes to review of her treatments consider x-ray. Will consider lidocaine over-the-counter patches for nighttime use may use an occasional rare Advil or Aleve after playing badminton if needed        Genitourinary   CKD (chronic kidney disease), stage III    Discuss use of nonsteroidals and caution          Follow up plan: Return for As  scheduled.

## 2015-08-12 ENCOUNTER — Ambulatory Visit (INDEPENDENT_AMBULATORY_CARE_PROVIDER_SITE_OTHER): Payer: PPO | Admitting: Obstetrics and Gynecology

## 2015-08-12 ENCOUNTER — Encounter: Payer: Self-pay | Admitting: Obstetrics and Gynecology

## 2015-08-12 VITALS — BP 140/64 | HR 69 | Ht 64.0 in | Wt 145.1 lb

## 2015-08-12 DIAGNOSIS — N952 Postmenopausal atrophic vaginitis: Secondary | ICD-10-CM

## 2015-08-12 DIAGNOSIS — L280 Lichen simplex chronicus: Secondary | ICD-10-CM

## 2015-08-12 NOTE — Progress Notes (Signed)
GYNECOLOGY PROGRESS NOTE  Subjective:    Patient ID: ROCELYN TURSO, female    DOB: 07/29/1928, 82 y.o.   MRN: [id number]  HPI  Patient is a 82 y.o. G79P2002 female who presents for f/u of vaginal atrophy and suspected lichenification.  Has not been using Premarin cream as prescribed (uses it every now and then).  Has been using steroid cream to vaginal area 3-4 times weekly. .  Denies complaints.  The following portions of the patient's history were reviewed and updated as appropriate: allergies, current medications, past family history, past medical history, past social history, past surgical history and problem list.  Review of Systems Pertinent items noted in HPI and remainder of comprehensive ROS otherwise negative.   Objective:   Blood pressure 140/64, pulse 69, height 5\' 4"  (1.626 m), weight 145 lb 1.6 oz (65.817 kg), last menstrual period 05/13/1980. General appearance: alert and no distress Pelvic: external genitalia mildly atrophic. No lichenification seen. Vaginal mucosa mildly atrophic. Uterus and cervix surgically absent.  No adnexal tenderness or masses.  Extremities: extremities normal, atraumatic, no cyanosis or edema Neurologic: Grossly normal   Assessment:   Vaginal atrophy (improved) Possible lichenification  Plan:   To discontinue use of steroid cream to vagina.  Only to use when symptomatic (i.e. Vaginal itching not relieved by Premarin) To resume using Premarin cream twice weekly.  RTC in 6 months.    Rubie Maid, MD [facility]

## 2015-08-19 ENCOUNTER — Ambulatory Visit: Payer: PPO | Admitting: Obstetrics and Gynecology

## 2015-09-24 DIAGNOSIS — H1045 Other chronic allergic conjunctivitis: Secondary | ICD-10-CM | POA: Diagnosis not present

## 2015-09-24 DIAGNOSIS — H401131 Primary open-angle glaucoma, bilateral, mild stage: Secondary | ICD-10-CM | POA: Diagnosis not present

## 2015-10-29 ENCOUNTER — Ambulatory Visit (INDEPENDENT_AMBULATORY_CARE_PROVIDER_SITE_OTHER): Payer: PPO | Admitting: Family Medicine

## 2015-10-29 ENCOUNTER — Encounter: Payer: Self-pay | Admitting: Family Medicine

## 2015-10-29 VITALS — BP 135/74 | HR 61 | Temp 97.9°F | Ht 63.1 in | Wt 141.0 lb

## 2015-10-29 DIAGNOSIS — E039 Hypothyroidism, unspecified: Secondary | ICD-10-CM

## 2015-10-29 DIAGNOSIS — Z0000 Encounter for general adult medical examination without abnormal findings: Secondary | ICD-10-CM | POA: Diagnosis not present

## 2015-10-29 DIAGNOSIS — N390 Urinary tract infection, site not specified: Secondary | ICD-10-CM | POA: Diagnosis not present

## 2015-10-29 DIAGNOSIS — E785 Hyperlipidemia, unspecified: Secondary | ICD-10-CM | POA: Diagnosis not present

## 2015-10-29 DIAGNOSIS — R8281 Pyuria: Secondary | ICD-10-CM

## 2015-10-29 DIAGNOSIS — I10 Essential (primary) hypertension: Secondary | ICD-10-CM

## 2015-10-29 LAB — URINALYSIS, ROUTINE W REFLEX MICROSCOPIC
Bilirubin, UA: NEGATIVE
GLUCOSE, UA: NEGATIVE
Ketones, UA: NEGATIVE
NITRITE UA: NEGATIVE
Specific Gravity, UA: 1.015 (ref 1.005–1.030)
Urobilinogen, Ur: 1 mg/dL (ref 0.2–1.0)
pH, UA: 8 — ABNORMAL HIGH (ref 5.0–7.5)

## 2015-10-29 LAB — MICROSCOPIC EXAMINATION: Epithelial Cells (non renal): >10 /hpf — AB (ref 0–10)

## 2015-10-29 MED ORDER — HYDROCHLOROTHIAZIDE 25 MG PO TABS: 25.0000 mg | ORAL_TABLET | Freq: Every day | ORAL | 4 refills | Status: DC

## 2015-10-29 MED ORDER — BENAZEPRIL HCL 40 MG PO TABS: 40.0000 mg | ORAL_TABLET | Freq: Every day | ORAL | 4 refills | Status: DC

## 2015-10-29 MED ORDER — LEVOTHYROXINE SODIUM 112 MCG PO TABS: 112.0000 ug | ORAL_TABLET | Freq: Every day | ORAL | 4 refills | Status: DC

## 2015-10-29 NOTE — Assessment & Plan Note (Signed)
The current medical regimen is effective;  continue present plan and medications.  

## 2015-10-29 NOTE — Progress Notes (Addendum)
BP 135/74 (BP Location: Left Arm, Patient Position: Sitting, Cuff Size: Small)   Pulse 61   Temp 97.9 F (36.6 C)   Ht 5' 3.1" (1.603 m)   Wt 141 lb (64 kg)   LMP 05/13/1980   SpO2 98%   BMI 24.90 kg/m    Subjective:    Patient ID: Cassie Brewer, female    DOB: [birth year], 82 y.o.   MRN: [id number]  HPI: Cassie Brewer is a 82 y.o. female  Chief Complaint  Patient presents with  . Annual Exam  Patient follow-up doing well no complaints Annual wellness visit Metrix met for Medicare Blood pressure doing well with good control no issues with medications Thyroid taking medications every day again no issues Takes lovastatin every other day with no complaints in good control Has lesion on her leg adjacent to the area of squamous cell carcinoma Brewer observe patient has appointment with dermatology later this fall.  Patient with no specific UTI symptoms but has had some slight changes that she is attributed to travel.  Relevant past medical, surgical, family and social history reviewed and updated as indicated. Interim medical history since our last visit reviewed. Allergies and medications reviewed and updated.  Review of Systems  Constitutional: Negative.   HENT: Negative.   Eyes: Negative.   Respiratory: Negative.   Cardiovascular: Negative.   Gastrointestinal: Negative.        Occ reflux  Endocrine: Negative.   Genitourinary: Negative.   Musculoskeletal: Negative.   Skin: Negative.   Allergic/Immunologic: Negative.   Neurological: Negative.   Hematological: Negative.   Psychiatric/Behavioral: Negative.     Per HPI unless specifically indicated above     Objective:    BP 135/74 (BP Location: Left Arm, Patient Position: Sitting, Cuff Size: Small)   Pulse 61   Temp 97.9 F (36.6 C)   Ht 5' 3.1" (1.603 m)   Wt 141 lb (64 kg)   LMP 05/13/1980   SpO2 98%   BMI 24.90 kg/m   Wt Readings from Last 3 Encounters:  10/29/15 141 lb (64 kg)  08/12/15 145 lb 1.6 oz  (65.8 kg)  07/28/15 143 lb (64.9 kg)    Physical Exam  Constitutional: She is oriented to person, place, and time. She appears well-developed and well-nourished.  HENT:  Head: Normocephalic and atraumatic.  Right Ear: External ear normal.  Left Ear: External ear normal.  Nose: Nose normal.  Mouth/Throat: Oropharynx is clear and moist.  Eyes: Conjunctivae and EOM are normal. Pupils are equal, round, and reactive to light.  Neck: Normal range of motion. Neck supple. Carotid bruit is not present.  Cardiovascular: Normal rate, regular rhythm and normal heart sounds.   No murmur heard. Pulmonary/Chest: Effort normal and breath sounds normal. She exhibits no mass. Right breast exhibits no mass, no skin change and no tenderness. Left breast exhibits no mass, no skin change and no tenderness. Breasts are symmetrical.  Abdominal: Soft. Bowel sounds are normal. There is no hepatosplenomegaly.  Musculoskeletal: Normal range of motion.  Neurological: She is alert and oriented to person, place, and time.  Skin: No rash noted.  Psychiatric: She has a normal mood and affect. Her behavior is normal. Judgment and thought content normal.    Results for orders placed or performed in visit on 04/24/15  LP+ALT+AST Piccolo, [employer]  Result Value Ref Range   ALT (SGPT) Piccolo, Waived 18 10 - 47 U/L   AST (SGOT) Piccolo, Waived 33 11 - 38 U/L  Cholesterol Piccolo, Waived 208 (H) <200 mg/dL   HDL Chol Piccolo, Waived 81 >59 mg/dL   Triglycerides Piccolo,Waived 90 <150 mg/dL   Chol/HDL Ratio Piccolo,Waive 2.6 mg/dL   LDL Chol Calc Piccolo Waived 108 (H) <100 mg/dL   VLDL Chol Calc Piccolo,Waive 18 <30 mg/dL  Basic metabolic panel  Result Value Ref Range   Glucose 92 65 - 99 mg/dL   BUN 22 8 - 27 mg/dL   Creatinine, Ser 1.04 (H) 0.57 - 1.00 mg/dL   GFR calc non Af Amer 49 (L) >59 mL/min/1.73   GFR calc Af Amer 57 (L) >59 mL/min/1.73   BUN/Creatinine Ratio 21 11 - 26   Sodium 138 134 - 144 mmol/L    Potassium 4.1 3.5 - 5.2 mmol/L   Chloride 98 96 - 106 mmol/L   CO2 24 18 - 29 mmol/L   Calcium 9.7 8.7 - 10.3 mg/dL      Assessment & Plan:   Problem List Items Addressed This Visit      Cardiovascular and Mediastinum   Hypertension    The current medical regimen is effective;  continue present plan and medications.       Relevant Medications   hydrochlorothiazide (HYDRODIURIL) 25 MG tablet   benazepril (LOTENSIN) 40 MG tablet     Endocrine   Hypothyroidism    The current medical regimen is effective;  continue present plan and medications.       Relevant Medications   levothyroxine (SYNTHROID, LEVOTHROID) 112 MCG tablet     Other   Hyperlipidemia    The current medical regimen is effective;  continue present plan and medications.       Relevant Medications   hydrochlorothiazide (HYDRODIURIL) 25 MG tablet   benazepril (LOTENSIN) 40 MG tablet    Other Visit Diagnoses    Annual physical exam    -  Primary   Relevant Orders   Urinalysis, Routine w reflex microscopic (not at [facility])   CBC with Differential/Platelet   Comprehensive metabolic panel   Lipid Panel w/o Chol/HDL Ratio   TSH   Pyuria       Relevant Orders   Urine culture       Follow up plan: Return in about 6 months (around 04/30/2016), or if symptoms worsen or fail to improve, for med check BMP lipids, alt, ast.

## 2015-10-30 ENCOUNTER — Encounter: Payer: Self-pay | Admitting: Family Medicine

## 2015-10-30 LAB — CBC WITH DIFFERENTIAL/PLATELET
BASOS ABS: 0 10*3/uL (ref 0.0–0.2)
Basos: 1 %
EOS (ABSOLUTE): 0.1 10*3/uL (ref 0.0–0.4)
EOS: 2 %
HEMATOCRIT: 36.4 % (ref 34.0–46.6)
Hemoglobin: 12.6 g/dL (ref 11.1–15.9)
IMMATURE GRANULOCYTES: 0 %
Immature Grans (Abs): 0 10*3/uL (ref 0.0–0.1)
LYMPHS ABS: 1.3 10*3/uL (ref 0.7–3.1)
Lymphs: 31 %
MCH: 29.8 pg (ref 26.6–33.0)
MCHC: 34.6 g/dL (ref 31.5–35.7)
MCV: 86 fL (ref 79–97)
MONOCYTES: 11 %
MONOS ABS: 0.4 10*3/uL (ref 0.1–0.9)
NEUTROS PCT: 55 %
Neutrophils Absolute: 2.3 10*3/uL (ref 1.4–7.0)
Platelets: 273 10*3/uL (ref 150–379)
RBC: 4.23 x10E6/uL (ref 3.77–5.28)
RDW: 14.4 % (ref 12.3–15.4)
WBC: 4.1 10*3/uL (ref 3.4–10.8)

## 2015-10-30 LAB — TSH: TSH: 1.21 u[IU]/mL (ref 0.450–4.500)

## 2015-10-30 LAB — LIPID PANEL W/O CHOL/HDL RATIO
CHOLESTEROL TOTAL: 222 mg/dL — AB (ref 100–199)
HDL: 73 mg/dL (ref >39)
LDL CALC: 137 mg/dL — AB (ref 0–99)
Triglycerides: 58 mg/dL (ref 0–149)
VLDL CHOLESTEROL CAL: 12 mg/dL (ref 5–40)

## 2015-10-30 LAB — COMPREHENSIVE METABOLIC PANEL
ALK PHOS: 48 IU/L (ref 39–117)
ALT: 14 IU/L (ref 0–32)
AST: 16 IU/L (ref 0–40)
Albumin/Globulin Ratio: 1.6 (ref 1.2–2.2)
Albumin: 4.3 g/dL (ref 3.5–4.7)
BUN/Creatinine Ratio: 18 (ref 12–28)
BUN: 17 mg/dL (ref 8–27)
Bilirubin Total: 0.6 mg/dL (ref 0.0–1.2)
CALCIUM: 9.5 mg/dL (ref 8.7–10.3)
CO2: 25 mmol/L (ref 18–29)
CREATININE: 0.95 mg/dL (ref 0.57–1.00)
Chloride: 102 mmol/L (ref 96–106)
GFR calc Af Amer: 63 mL/min/{1.73_m2} (ref >59)
GFR, EST NON AFRICAN AMERICAN: 55 mL/min/{1.73_m2} — AB (ref >59)
GLOBULIN, TOTAL: 2.7 g/dL (ref 1.5–4.5)
GLUCOSE: 99 mg/dL (ref 65–99)
Potassium: 4.2 mmol/L (ref 3.5–5.2)
Sodium: 140 mmol/L (ref 134–144)
Total Protein: 7 g/dL (ref 6.0–8.5)

## 2015-10-31 LAB — URINE CULTURE

## 2015-11-03 ENCOUNTER — Telehealth: Payer: Self-pay | Admitting: Family Medicine

## 2015-11-03 MED ORDER — CIPROFLOXACIN HCL 250 MG PO TABS: 250.0000 mg | ORAL_TABLET | Freq: Two times a day (BID) | ORAL | 0 refills | Status: DC

## 2015-11-03 NOTE — Telephone Encounter (Signed)
Phone call Discussed with patient UTI will treat with Cipro as allergic to penicillin and erythromycin.

## 2015-12-31 DIAGNOSIS — Z85828 Personal history of other malignant neoplasm of skin: Secondary | ICD-10-CM | POA: Diagnosis not present

## 2015-12-31 DIAGNOSIS — L900 Lichen sclerosus et atrophicus: Secondary | ICD-10-CM | POA: Diagnosis not present

## 2015-12-31 DIAGNOSIS — C44722 Squamous cell carcinoma of skin of right lower limb, including hip: Secondary | ICD-10-CM | POA: Diagnosis not present

## 2015-12-31 DIAGNOSIS — Z08 Encounter for follow-up examination after completed treatment for malignant neoplasm: Secondary | ICD-10-CM | POA: Diagnosis not present

## 2015-12-31 DIAGNOSIS — D485 Neoplasm of uncertain behavior of skin: Secondary | ICD-10-CM | POA: Diagnosis not present

## 2016-01-30 ENCOUNTER — Ambulatory Visit (INDEPENDENT_AMBULATORY_CARE_PROVIDER_SITE_OTHER): Payer: PPO

## 2016-01-30 DIAGNOSIS — Z23 Encounter for immunization: Secondary | ICD-10-CM | POA: Diagnosis not present

## 2016-02-04 DIAGNOSIS — C44722 Squamous cell carcinoma of skin of right lower limb, including hip: Secondary | ICD-10-CM | POA: Diagnosis not present

## 2016-02-04 DIAGNOSIS — L905 Scar conditions and fibrosis of skin: Secondary | ICD-10-CM | POA: Diagnosis not present

## 2016-02-10 ENCOUNTER — Ambulatory Visit: Payer: PPO | Admitting: Obstetrics and Gynecology

## 2016-03-10 ENCOUNTER — Ambulatory Visit (INDEPENDENT_AMBULATORY_CARE_PROVIDER_SITE_OTHER): Payer: PPO | Admitting: Family Medicine

## 2016-03-10 ENCOUNTER — Encounter: Payer: Self-pay | Admitting: Family Medicine

## 2016-03-10 VITALS — BP 123/71 | HR 73 | Temp 98.4°F | Ht 63.0 in | Wt 143.8 lb

## 2016-03-10 DIAGNOSIS — I10 Essential (primary) hypertension: Secondary | ICD-10-CM

## 2016-03-10 DIAGNOSIS — J019 Acute sinusitis, unspecified: Secondary | ICD-10-CM

## 2016-03-10 MED ORDER — SULFAMETHOXAZOLE-TRIMETHOPRIM 800-160 MG PO TABS: 1.0000 | ORAL_TABLET | Freq: Two times a day (BID) | ORAL | 0 refills | Status: DC

## 2016-03-10 NOTE — Progress Notes (Signed)
BP 123/71 (BP Location: Left Arm, Patient Position: Sitting, Cuff Size: Normal)   Pulse 73   Temp 98.4 F (36.9 C)   Ht 5\' 3"  (1.6 m)   Wt 143 lb 12.8 oz (65.2 kg)   LMP 05/13/1980   SpO2 (!) 84%   BMI 25.47 kg/m    Subjective:    Patient ID: Cassie Brewer, female    DOB: Jul 11, 1928, 82 y.o.   MRN: [id number]  HPI: Cassie Brewer is a 82 y.o. female  Chief Complaint  Patient presents with  . Cough    Productive. Yellow in color.   . Nasal Congestion  . Headache  . Side Pain    Left Side   Patient with over a week of marked head congestion and facial pressure and sinus discomfort going down into her upper teeth. Has tried Coricidin but was unable to take that due to just feeling bad. Blood pressures been doing good taking blood pressure medicines without problems Has also been running some fever and generalized aches  Relevant past medical, surgical, family and social history reviewed and updated as indicated. Interim medical history since our last visit reviewed. Allergies and medications reviewed and updated.  Review of Systems  Constitutional: Positive for chills, diaphoresis, fatigue and fever.  HENT: Positive for congestion, facial swelling, mouth sores, nosebleeds, postnasal drip, rhinorrhea, sinus pressure, sneezing and sore throat.   Respiratory: Positive for cough. Negative for shortness of breath.   Cardiovascular: Negative.     Per HPI unless specifically indicated above     Objective:    BP 123/71 (BP Location: Left Arm, Patient Position: Sitting, Cuff Size: Normal)   Pulse 73   Temp 98.4 F (36.9 C)   Ht 5\' 3"  (1.6 m)   Wt 143 lb 12.8 oz (65.2 kg)   LMP 05/13/1980   SpO2 (!) 84%   BMI 25.47 kg/m   Wt Readings from Last 3 Encounters:  03/10/16 143 lb 12.8 oz (65.2 kg)  10/29/15 141 lb (64 kg)  08/12/15 145 lb 1.6 oz (65.8 kg)    Physical Exam  Constitutional: She is oriented to person, place, and time. She appears well-developed and  well-nourished. No distress.  HENT:  Head: Normocephalic and atraumatic.  Right Ear: Hearing and external ear normal.  Left Ear: Hearing and external ear normal.  Nose: Nose normal.  Mouth/Throat: Oropharyngeal exudate present.  Eyes: Conjunctivae and lids are normal. Right eye exhibits no discharge. Left eye exhibits no discharge. No scleral icterus.  Neck: No thyromegaly present.  Cardiovascular: Normal rate, regular rhythm and normal heart sounds.   Pulmonary/Chest: Effort normal and breath sounds normal. No respiratory distress.  Musculoskeletal: Normal range of motion.  Lymphadenopathy:    She has no cervical adenopathy.  Neurological: She is alert and oriented to person, place, and time.  Skin: Skin is intact. No rash noted.  Psychiatric: She has a normal mood and affect. Her speech is normal and behavior is normal. Judgment and thought content normal. Cognition and memory are normal.    Results for orders placed or performed in visit on 10/29/15  Urine culture  Result Value Ref Range   Urine Culture, Routine Final report (A)    Urine Culture result 1 Comment (A)   Microscopic Examination  Result Value Ref Range   WBC, UA 6-10 (A) 0 - 5 /hpf   RBC, UA 0-2 0 - 2 /hpf   Epithelial Cells (non renal) >10 (A) 0 - 10 /hpf  Bacteria, UA Few None seen/Few  Urinalysis, Routine w reflex microscopic (not at [facility])  Result Value Ref Range   Specific Gravity, UA 1.015 1.005 - 1.030   pH, UA 8.0 (H) 5.0 - 7.5   Color, UA Yellow Yellow   Appearance Ur Cloudy (A) Clear   Leukocytes, UA 1+ (A) Negative   Protein, UA Trace Negative/Trace   Glucose, UA Negative Negative   Ketones, UA Negative Negative   RBC, UA Trace (A) Negative   Bilirubin, UA Negative Negative   Urobilinogen, Ur 1.0 0.2 - 1.0 mg/dL   Nitrite, UA Negative Negative   Microscopic Examination See below:   CBC with Differential/Platelet  Result Value Ref Range   WBC 4.1 3.4 - 10.8 x10E3/uL   RBC 4.23 3.77 - 5.28  x10E6/uL   Hemoglobin 12.6 11.1 - 15.9 g/dL   Hematocrit 36.4 34.0 - 46.6 %   MCV 86 79 - 97 fL   MCH 29.8 26.6 - 33.0 pg   MCHC 34.6 31.5 - 35.7 g/dL   RDW 14.4 12.3 - 15.4 %   Platelets 273 150 - 379 x10E3/uL   Neutrophils 55 %   Lymphs 31 %   Monocytes 11 %   Eos 2 %   Basos 1 %   Neutrophils Absolute 2.3 1.4 - 7.0 x10E3/uL   Lymphocytes Absolute 1.3 0.7 - 3.1 x10E3/uL   Monocytes Absolute 0.4 0.1 - 0.9 x10E3/uL   EOS (ABSOLUTE) 0.1 0.0 - 0.4 x10E3/uL   Basophils Absolute 0.0 0.0 - 0.2 x10E3/uL   Immature Granulocytes 0 %   Immature Grans (Abs) 0.0 0.0 - 0.1 x10E3/uL  Comprehensive metabolic panel  Result Value Ref Range   Glucose 99 65 - 99 mg/dL   BUN 17 8 - 27 mg/dL   Creatinine, Ser 0.95 0.57 - 1.00 mg/dL   GFR calc non Af Amer 55 (L) >59 mL/min/1.73   GFR calc Af Amer 63 >59 mL/min/1.73   BUN/Creatinine Ratio 18 12 - 28   Sodium 140 134 - 144 mmol/L   Potassium 4.2 3.5 - 5.2 mmol/L   Chloride 102 96 - 106 mmol/L   CO2 25 18 - 29 mmol/L   Calcium 9.5 8.7 - 10.3 mg/dL   Total Protein 7.0 6.0 - 8.5 g/dL   Albumin 4.3 3.5 - 4.7 g/dL   Globulin, Total 2.7 1.5 - 4.5 g/dL   Albumin/Globulin Ratio 1.6 1.2 - 2.2   Bilirubin Total 0.6 0.0 - 1.2 mg/dL   Alkaline Phosphatase 48 39 - 117 IU/L   AST 16 0 - 40 IU/L   ALT 14 0 - 32 IU/L  Lipid Panel w/o Chol/HDL Ratio  Result Value Ref Range   Cholesterol, Total 222 (H) 100 - 199 mg/dL   Triglycerides 58 0 - 149 mg/dL   HDL 73 >39 mg/dL   VLDL Cholesterol Cal 12 5 - 40 mg/dL   LDL Calculated 137 (H) 0 - 99 mg/dL  TSH  Result Value Ref Range   TSH 1.210 0.450 - 4.500 uIU/mL      Assessment & Plan:   Problem List Items Addressed This Visit      Cardiovascular and Mediastinum   Hypertension    The current medical regimen is effective;  continue present plan and medications.        Other Visit Diagnoses    Acute sinusitis, recurrence not specified, unspecified location    -  Primary   Discussed sinusitis care and  treatment Mucinex Tylenol nasal rinse will do  Septra because of drug allergies.   Relevant Medications   sulfamethoxazole-trimethoprim (BACTRIM DS,SEPTRA DS) 800-160 MG tablet       Follow up plan: Return for As scheduled.

## 2016-03-10 NOTE — Assessment & Plan Note (Signed)
The current medical regimen is effective;  continue present plan and medications.  

## 2016-03-31 DIAGNOSIS — H1045 Other chronic allergic conjunctivitis: Secondary | ICD-10-CM | POA: Diagnosis not present

## 2016-03-31 DIAGNOSIS — H401131 Primary open-angle glaucoma, bilateral, mild stage: Secondary | ICD-10-CM | POA: Diagnosis not present

## 2016-05-05 ENCOUNTER — Encounter: Payer: Self-pay | Admitting: Family Medicine

## 2016-05-05 ENCOUNTER — Ambulatory Visit (INDEPENDENT_AMBULATORY_CARE_PROVIDER_SITE_OTHER): Payer: PPO | Admitting: Family Medicine

## 2016-05-05 VITALS — BP 132/64 | HR 66 | Ht 63.39 in | Wt 143.0 lb

## 2016-05-05 DIAGNOSIS — I10 Essential (primary) hypertension: Secondary | ICD-10-CM

## 2016-05-05 DIAGNOSIS — E785 Hyperlipidemia, unspecified: Secondary | ICD-10-CM | POA: Diagnosis not present

## 2016-05-05 LAB — LP+ALT+AST PICCOLO, WAIVED
ALT (SGPT) Piccolo, Waived: 15 U/L (ref 10–47)
AST (SGOT) Piccolo, Waived: 25 U/L (ref 11–38)
CHOL/HDL RATIO PICCOLO,WAIVE: 2.6 mg/dL
Cholesterol Piccolo, Waived: 211 mg/dL — ABNORMAL HIGH (ref <200)
HDL Chol Piccolo, Waived: 82 mg/dL (ref >59)
LDL CHOL CALC PICCOLO WAIVED: 113 mg/dL — AB (ref <100)
Triglycerides Piccolo,Waived: 77 mg/dL (ref <150)
VLDL CHOL CALC PICCOLO,WAIVE: 15 mg/dL (ref <30)

## 2016-05-05 NOTE — Assessment & Plan Note (Signed)
The current medical regimen is effective;  continue present plan and medications.  

## 2016-05-05 NOTE — Progress Notes (Signed)
BP 132/64 (BP Location: Left Arm)   Pulse 66   Ht 5' 3.39" (1.61 m)   Wt 143 lb (64.9 kg)   LMP 05/13/1980   SpO2 98%   BMI 25.02 kg/m    Subjective:    Patient ID: Cassie Brewer, female    DOB: 1928/07/22, 83 y.o.   MRN: [id number]  HPI: Cassie Brewer is a 83 y.o. female  Chief Complaint  Patient presents with  . Follow-up  . Hypertension  . Alopecia    Started 2 years ago  . Pruritis    Upper arms  . Skin Discoloration    Right lower leg x 2 years   Patient follow-up hypertension doing well no complaints from medications taken faithfully without problems Patient has some itching of her arms with dry skin she is lotion occasionally which seems to help but not much use of lotion which has helped. Has some red patches on her lower legs has had previous actinic keratosis problems. Relevant past medical, surgical, family and social history reviewed and updated as indicated. Interim medical history since our last visit reviewed. Allergies and medications reviewed and updated.  Review of Systems  Constitutional: Negative.   Respiratory: Negative.   Cardiovascular: Negative.     Per HPI unless specifically indicated above     Objective:    BP 132/64 (BP Location: Left Arm)   Pulse 66   Ht 5' 3.39" (1.61 m)   Wt 143 lb (64.9 kg)   LMP 05/13/1980   SpO2 98%   BMI 25.02 kg/m   Wt Readings from Last 3 Encounters:  05/05/16 143 lb (64.9 kg)  03/10/16 143 lb 12.8 oz (65.2 kg)  10/29/15 141 lb (64 kg)    Physical Exam  Constitutional: She is oriented to person, place, and time. She appears well-developed and well-nourished. No distress.  HENT:  Head: Normocephalic and atraumatic.  Right Ear: Hearing normal.  Left Ear: Hearing normal.  Nose: Nose normal.  Eyes: Conjunctivae and lids are normal. Right eye exhibits no discharge. Left eye exhibits no discharge. No scleral icterus.  Cardiovascular: Normal rate, regular rhythm and normal heart sounds.     Pulmonary/Chest: Effort normal and breath sounds normal. No respiratory distress.  Musculoskeletal: Normal range of motion.  Neurological: She is alert and oriented to person, place, and time.  Skin: Skin is intact. No rash noted.  Psychiatric: She has a normal mood and affect. Her speech is normal and behavior is normal. Judgment and thought content normal. Cognition and memory are normal.    Results for orders placed or performed in visit on 10/29/15  Urine culture  Result Value Ref Range   Urine Culture, Routine Final report (A)    Urine Culture result 1 Comment (A)   Microscopic Examination  Result Value Ref Range   WBC, UA 6-10 (A) 0 - 5 /hpf   RBC, UA 0-2 0 - 2 /hpf   Epithelial Cells (non renal) >10 (A) 0 - 10 /hpf   Bacteria, UA Few None seen/Few  Urinalysis, Routine w reflex microscopic (not at [facility])  Result Value Ref Range   Specific Gravity, UA 1.015 1.005 - 1.030   pH, UA 8.0 (H) 5.0 - 7.5   Color, UA Yellow Yellow   Appearance Ur Cloudy (A) Clear   Leukocytes, UA 1+ (A) Negative   Protein, UA Trace Negative/Trace   Glucose, UA Negative Negative   Ketones, UA Negative Negative   RBC, UA Trace (A) Negative  Bilirubin, UA Negative Negative   Urobilinogen, Ur 1.0 0.2 - 1.0 mg/dL   Nitrite, UA Negative Negative   Microscopic Examination See below:   CBC with Differential/Platelet  Result Value Ref Range   WBC 4.1 3.4 - 10.8 x10E3/uL   RBC 4.23 3.77 - 5.28 x10E6/uL   Hemoglobin 12.6 11.1 - 15.9 g/dL   Hematocrit 36.4 34.0 - 46.6 %   MCV 86 79 - 97 fL   MCH 29.8 26.6 - 33.0 pg   MCHC 34.6 31.5 - 35.7 g/dL   RDW 14.4 12.3 - 15.4 %   Platelets 273 150 - 379 x10E3/uL   Neutrophils 55 %   Lymphs 31 %   Monocytes 11 %   Eos 2 %   Basos 1 %   Neutrophils Absolute 2.3 1.4 - 7.0 x10E3/uL   Lymphocytes Absolute 1.3 0.7 - 3.1 x10E3/uL   Monocytes Absolute 0.4 0.1 - 0.9 x10E3/uL   EOS (ABSOLUTE) 0.1 0.0 - 0.4 x10E3/uL   Basophils Absolute 0.0 0.0 - 0.2 x10E3/uL    Immature Granulocytes 0 %   Immature Grans (Abs) 0.0 0.0 - 0.1 x10E3/uL  Comprehensive metabolic panel  Result Value Ref Range   Glucose 99 65 - 99 mg/dL   BUN 17 8 - 27 mg/dL   Creatinine, Ser 0.95 0.57 - 1.00 mg/dL   GFR calc non Af Amer 55 (L) >59 mL/min/1.73   GFR calc Af Amer 63 >59 mL/min/1.73   BUN/Creatinine Ratio 18 12 - 28   Sodium 140 134 - 144 mmol/L   Potassium 4.2 3.5 - 5.2 mmol/L   Chloride 102 96 - 106 mmol/L   CO2 25 18 - 29 mmol/L   Calcium 9.5 8.7 - 10.3 mg/dL   Total Protein 7.0 6.0 - 8.5 g/dL   Albumin 4.3 3.5 - 4.7 g/dL   Globulin, Total 2.7 1.5 - 4.5 g/dL   Albumin/Globulin Ratio 1.6 1.2 - 2.2   Bilirubin Total 0.6 0.0 - 1.2 mg/dL   Alkaline Phosphatase 48 39 - 117 IU/L   AST 16 0 - 40 IU/L   ALT 14 0 - 32 IU/L  Lipid Panel w/o Chol/HDL Ratio  Result Value Ref Range   Cholesterol, Total 222 (H) 100 - 199 mg/dL   Triglycerides 58 0 - 149 mg/dL   HDL 73 >39 mg/dL   VLDL Cholesterol Cal 12 5 - 40 mg/dL   LDL Calculated 137 (H) 0 - 99 mg/dL  TSH  Result Value Ref Range   TSH 1.210 0.450 - 4.500 uIU/mL      Assessment & Plan:   Problem List Items Addressed This Visit      Cardiovascular and Mediastinum   Hypertension - Primary    The current medical regimen is effective;  continue present plan and medications.       Relevant Orders   Basic metabolic panel   LP+ALT+AST Piccolo, Waived     Other   Hyperlipidemia    The current medical regimen is effective;  continue present plan and medications.       Relevant Orders   Basic metabolic panel   LP+ALT+AST Piccolo, Waived     Legs with some actinic keratosis skin on shoulders and dry recommend lotion.   Follow up plan: Return in about 6 months (around 11/02/2016) for Physical Exam.

## 2016-05-06 ENCOUNTER — Encounter: Payer: Self-pay | Admitting: Family Medicine

## 2016-05-06 DIAGNOSIS — R208 Other disturbances of skin sensation: Secondary | ICD-10-CM | POA: Diagnosis not present

## 2016-05-06 DIAGNOSIS — Z08 Encounter for follow-up examination after completed treatment for malignant neoplasm: Secondary | ICD-10-CM | POA: Diagnosis not present

## 2016-05-06 DIAGNOSIS — L821 Other seborrheic keratosis: Secondary | ICD-10-CM | POA: Diagnosis not present

## 2016-05-06 DIAGNOSIS — D485 Neoplasm of uncertain behavior of skin: Secondary | ICD-10-CM | POA: Diagnosis not present

## 2016-05-06 DIAGNOSIS — Z85828 Personal history of other malignant neoplasm of skin: Secondary | ICD-10-CM | POA: Diagnosis not present

## 2016-05-06 DIAGNOSIS — L820 Inflamed seborrheic keratosis: Secondary | ICD-10-CM | POA: Diagnosis not present

## 2016-05-06 LAB — BASIC METABOLIC PANEL
BUN/Creatinine Ratio: 22 (ref 12–28)
BUN: 19 mg/dL (ref 8–27)
CALCIUM: 9.4 mg/dL (ref 8.7–10.3)
CO2: 27 mmol/L (ref 18–29)
Chloride: 99 mmol/L (ref 96–106)
Creatinine, Ser: 0.88 mg/dL (ref 0.57–1.00)
GFR, EST AFRICAN AMERICAN: 69 mL/min/{1.73_m2} (ref >59)
GFR, EST NON AFRICAN AMERICAN: 60 mL/min/{1.73_m2} (ref >59)
Glucose: 101 mg/dL — ABNORMAL HIGH (ref 65–99)
POTASSIUM: 4.1 mmol/L (ref 3.5–5.2)
SODIUM: 139 mmol/L (ref 134–144)

## 2016-07-14 DIAGNOSIS — H401131 Primary open-angle glaucoma, bilateral, mild stage: Secondary | ICD-10-CM | POA: Diagnosis not present

## 2016-09-10 ENCOUNTER — Telehealth: Payer: Self-pay | Admitting: Family Medicine

## 2016-09-10 NOTE — Telephone Encounter (Signed)
Called pt to schedule Annual Wellness Visit with [facility]  - knb  °

## 2016-09-20 NOTE — Telephone Encounter (Signed)
Pt returned call, I called back and left msg with date and time that may work on July 11th. knb

## 2016-09-21 ENCOUNTER — Ambulatory Visit (INDEPENDENT_AMBULATORY_CARE_PROVIDER_SITE_OTHER): Payer: PPO | Admitting: Family Medicine

## 2016-09-21 ENCOUNTER — Encounter: Payer: Self-pay | Admitting: Family Medicine

## 2016-09-21 VITALS — BP 122/60 | HR 75 | Temp 98.1°F | Wt 145.7 lb

## 2016-09-21 DIAGNOSIS — B351 Tinea unguium: Secondary | ICD-10-CM

## 2016-09-21 DIAGNOSIS — R21 Rash and other nonspecific skin eruption: Secondary | ICD-10-CM

## 2016-09-21 MED ORDER — TRIAMCINOLONE ACETONIDE 0.5 % EX OINT: 1.0000 "application " | TOPICAL_OINTMENT | Freq: Two times a day (BID) | CUTANEOUS | 0 refills | Status: DC

## 2016-09-21 NOTE — Patient Instructions (Signed)
Appointment with Dr. Vickki Muff July 12, 9:30AM [place], [place], [place] [address] [phone]

## 2016-09-21 NOTE — Progress Notes (Signed)
BP 122/60   Pulse 75   Temp 98.1 F (36.7 C)   Wt 145 lb 11.2 oz (66.1 kg)   LMP 05/13/1980   SpO2 96%   BMI 25.50 kg/m    Subjective:    Patient ID: Cassie Brewer, female    DOB: 1928/06/20, 83 y.o.   MRN: [id number]  HPI: Cassie Brewer is a 83 y.o. female  Chief Complaint  Patient presents with  . Nail Problem    Right big toe, smashed it last year and it has not gotten better  . Rash    purple circles on right lower leg   TOE PAIN- hurt her toe about a year ago, and had a black spot on her nail, thought it was going to come off, but it didn't. Seemed to get thick and yellow. She notes that it is occasionally painful Duration: chronic Involved toe: rightbig toe  Mechanism of injury: trauma- about a year ago Onset: gradual Severity: mild  Quality: aching Frequency: occasional Radiation: no Aggravating factors: weight bearing  Alleviating factors:  nothing Status: worse Treatments attempted: rest  Relief with NSAIDs?: No NSAIDs Taken Morning stiffness: no Redness: no  Bruising: no Swelling: no Paresthesias / decreased sensation: no Fevers: no   RASH- spoke to the dermatologist, they were not concerned Duration:  months  Location: legs  Itching: yes Burning: yes Redness: yes Oozing: yes Scaling: no Blisters: yes Painful: yes Fevers: no Change in detergents/soaps/personal care products: no Recent illness: no Recent travel:no History of same: yes Context: stable Alleviating factors: nothing Treatments attempted:nothing Shortness of breath: no  Throat/tongue swelling: no Myalgias/arthralgias: no   Relevant past medical, surgical, family and social history reviewed and updated as indicated. Interim medical history since our last visit reviewed. Allergies and medications reviewed and updated.  Review of Systems  Constitutional: Negative.   Respiratory: Negative.   Cardiovascular: Negative.   Skin: Positive for rash. Negative for color change,  pallor and wound.       Thick, painful nail  Psychiatric/Behavioral: Negative.     Per HPI unless specifically indicated above     Objective:    BP 122/60   Pulse 75   Temp 98.1 F (36.7 C)   Wt 145 lb 11.2 oz (66.1 kg)   LMP 05/13/1980   SpO2 96%   BMI 25.50 kg/m   Wt Readings from Last 3 Encounters:  09/21/16 145 lb 11.2 oz (66.1 kg)  05/05/16 143 lb (64.9 kg)  03/10/16 143 lb 12.8 oz (65.2 kg)    Physical Exam  Constitutional: She is oriented to person, place, and time. She appears well-developed and well-nourished. No distress.  HENT:  Head: Normocephalic and atraumatic.  Right Ear: Hearing normal.  Left Ear: Hearing normal.  Nose: Nose normal.  Eyes: Conjunctivae and lids are normal. Right eye exhibits no discharge. Left eye exhibits no discharge. No scleral icterus.  Cardiovascular: Normal rate, regular rhythm, normal heart sounds and intact distal pulses.  Exam reveals no gallop and no friction rub.   No murmur heard. Pulmonary/Chest: Effort normal and breath sounds normal. No respiratory distress. She has no wheezes. She has no rales. She exhibits no tenderness.  Musculoskeletal: Normal range of motion.  Neurological: She is alert and oriented to person, place, and time.  Skin: Skin is warm, dry and intact. Rash noted. No erythema. No pallor.  Thick, discolored nail on R great toe  Pustular erythematous rash on legs bilaterally  Psychiatric: She has a normal mood  and affect. Her speech is normal and behavior is normal. Judgment and thought content normal. Cognition and memory are normal.  Nursing note and vitals reviewed.   Results for orders placed or performed in visit on 68/12/75  Basic metabolic panel  Result Value Ref Range   Glucose 101 (H) 65 - 99 mg/dL   BUN 19 8 - 27 mg/dL   Creatinine, Ser 0.88 0.57 - 1.00 mg/dL   GFR calc non Af Amer 60 >59 mL/min/1.73   GFR calc Af Amer 69 >59 mL/min/1.73   BUN/Creatinine Ratio 22 12 - 28   Sodium 139 134 -  144 mmol/L   Potassium 4.1 3.5 - 5.2 mmol/L   Chloride 99 96 - 106 mmol/L   CO2 27 18 - 29 mmol/L   Calcium 9.4 8.7 - 10.3 mg/dL  LP+ALT+AST Piccolo, Waived  Result Value Ref Range   ALT (SGPT) Piccolo, Waived 15 10 - 47 U/L   AST (SGOT) Piccolo, Waived 25 11 - 38 U/L   Cholesterol Piccolo, Waived 211 (H) <200 mg/dL   HDL Chol Piccolo, Waived 82 >59 mg/dL   Triglycerides Piccolo,Waived 77 <150 mg/dL   Chol/HDL Ratio Piccolo,Waive 2.6 mg/dL   LDL Chol Calc Piccolo Waived 113 (H) <100 mg/dL   VLDL Chol Calc Piccolo,Waive 15 <30 mg/dL      Assessment & Plan:   Problem List Items Addressed This Visit    None    Visit Diagnoses    Nail fungus    -  Primary   Will get her into see Dr. Cleda Mccreedy for her fungus. Call with any concerns.    Rash       Will treat with triamcinalone. Call if not getting better or getting worse.        Follow up plan: Return As scheduled, for Please change her appointment from [facility] to me- [facility] (does not want to see nurse).

## 2016-09-24 ENCOUNTER — Ambulatory Visit: Payer: PPO | Admitting: Family Medicine

## 2016-10-07 DIAGNOSIS — M79674 Pain in right toe(s): Secondary | ICD-10-CM | POA: Diagnosis not present

## 2016-10-07 DIAGNOSIS — B351 Tinea unguium: Secondary | ICD-10-CM | POA: Diagnosis not present

## 2016-10-08 ENCOUNTER — Encounter: Payer: Self-pay | Admitting: Family Medicine

## 2016-11-03 ENCOUNTER — Ambulatory Visit (INDEPENDENT_AMBULATORY_CARE_PROVIDER_SITE_OTHER): Payer: PPO

## 2016-11-03 ENCOUNTER — Ambulatory Visit: Payer: PPO | Admitting: Family Medicine

## 2016-11-03 VITALS — BP 142/84 | HR 61 | Temp 98.0°F | Resp 16 | Ht 63.0 in | Wt 145.1 lb

## 2016-11-03 DIAGNOSIS — Z0000 Encounter for general adult medical examination without abnormal findings: Secondary | ICD-10-CM

## 2016-11-03 NOTE — Progress Notes (Signed)
Subjective:   Cassie Brewer is a 83 y.o. female who presents for Medicare Annual (Subsequent) preventive examination.  Review of Systems:  Cardiac Risk Factors include: dyslipidemia;hypertension;advanced age (>14men, >66 women)     Objective:     Vitals: BP (!) 142/84 (BP Location: Left Arm, Patient Position: Sitting)   Pulse 61   Temp 98 F (36.7 C)   Resp 16   Ht 5\' 3"  (1.6 m)   Wt 145 lb 1.6 oz (65.8 kg)   LMP 05/13/1980   BMI 25.70 kg/m   Body mass index is 25.7 kg/m.   Tobacco History  Smoking Status  . Never Smoker  Smokeless Tobacco  . Never Used     Counseling given: Not Answered   Past Medical History:  Diagnosis Date  . Glaucoma   . Hyperlipidemia   . Hypertension   . Overactive bladder   . Squamous cell skin cancer, face    Past Surgical History:  Procedure Laterality Date  . ABDOMINAL HYSTERECTOMY    . BREAST SURGERY    . THYROIDECTOMY, PARTIAL Right    Family History  Problem Relation Age of Onset  . Hypertension Mother   . Diabetes Father   . Cancer Father        colon   History  Sexual Activity  . Sexual activity: Not Currently  . Birth control/ protection: Surgical    Outpatient Encounter Prescriptions as of 11/03/2016  Medication Sig  . benazepril (LOTENSIN) 40 MG tablet Take 1 tablet (40 mg total) by mouth daily.  . Calcium Carbonate-Vit D-Min (CALCIUM 600+D PLUS MINERALS) 600-400 MG-UNIT TABS   . conjugated estrogens (PREMARIN) vaginal cream Place 1 Applicatorful vaginally daily. 1/2 gram nightly x 14 days, then decrease to twice weekly.  . hydrochlorothiazide (HYDRODIURIL) 25 MG tablet Take 1 tablet (25 mg total) by mouth daily.  Marland Kitchen latanoprost (XALATAN) 0.005 % ophthalmic solution Place 1 drop into both eyes at bedtime.  Marland Kitchen levothyroxine (SYNTHROID, LEVOTHROID) 112 MCG tablet Take 1 tablet (112 mcg total) by mouth daily before breakfast.  . lovastatin (MEVACOR) 20 MG tablet Take 1 tablet (20 mg total) by mouth at bedtime.  .  Multiple Vitamins-Minerals (CENTRUM SILVER ULTRA WOMENS PO) Take by mouth.  . Omega-3 Fatty Acids (FISH OIL) 1000 MG CAPS Take by mouth daily.  . timolol (TIMOPTIC) 0.5 % ophthalmic solution   . triamcinolone ointment (KENALOG) 0.5 % Apply 1 application topically 2 (two) times daily.  . vitamin C (ASCORBIC ACID) 500 MG tablet Take 500 mg by mouth daily.   No facility-administered encounter medications on file as of 11/03/2016.     Activities of Daily Living In your present state of health, do you have any difficulty performing the following activities: 11/03/2016  Hearing? Y  Vision? N  Difficulty concentrating or making decisions? Y  Walking or climbing stairs? N  Dressing or bathing? N  Doing errands, shopping? N  Preparing Food and eating ? N  Using the Toilet? N  In the past six months, have you accidently leaked urine? N  Do you have problems with loss of bowel control? N  Managing your Medications? N  Managing your Finances? N  Housekeeping or managing your Housekeeping? N  Some recent data might be hidden    Patient Care Team: Valerie Roys, DO as PCP - General (Family Medicine) Oneta Rack, MD (Dermatology) Sharlotte Alamo, DPM (Podiatry)    Assessment:     Exercise Activities and Dietary recommendations Current Exercise Habits:  Home exercise routine, Type of exercise: Other - see comments (plays senior games), Time (Minutes): 30, Frequency (Times/Week): 3, Weekly Exercise (Minutes/Week): 90, Intensity: Mild, Exercise limited by: None identified  Goals    . Increase water intake          Recommend drinking at least 6-7 glasses of water a day     . Prevent Falls      Fall Risk Fall Risk  11/03/2016 05/05/2016 10/29/2015  Falls in the past year? Yes No No  Number falls in past yr: 2 or more - -  Injury with Fall? No - -  Follow up Falls prevention discussed - -   Depression Screen PHQ 2/9 Scores 11/03/2016 10/29/2015  PHQ - 2 Score 0 0     Cognitive Function       6CIT Screen 11/03/2016  What Year? 0 points  What month? 0 points  What time? 0 points  Count back from 20 0 points  Months in reverse 0 points  Repeat phrase 0 points  Total Score 0    Immunization History  Administered Date(s) Administered  . Influenza, High Dose Seasonal PF 01/30/2016  . Influenza,inj,Quad PF,36+ Mos 01/21/2015  . Pneumococcal Conjugate-13 10/01/2013  . Pneumococcal Polysaccharide-23 09/28/2006  . Td 09/28/2006   Screening Tests Health Maintenance  Topic Date Due  . TETANUS/TDAP  09/27/2016  . INFLUENZA VACCINE  10/27/2016  . DEXA SCAN  Completed  . PNA vac Low Risk Adult  Completed      Plan:     I have personally reviewed and addressed the Medicare Annual Wellness questionnaire and have noted the following in the patient's chart:  A. Medical and social history B. Use of alcohol, tobacco or illicit drugs  C. Current medications and supplements D. Functional ability and status E.  Nutritional status F.  Physical activity G. Advance directives H. List of other physicians I.  Hospitalizations, surgeries, and ER visits in previous 12 months J.  [place] such as hearing and vision if needed, cognitive and depression L. Referrals and appointments  In addition, I have reviewed and discussed with patient certain preventive protocols, quality metrics, and best practice recommendations. A written personalized care plan for preventive services as well as general preventive health recommendations were provided to patient.   Signed,  Tyler Aas, LPN Nurse Health Advisor   MD Recommendations: none

## 2016-11-03 NOTE — Patient Instructions (Addendum)
[name] , Thank you for taking time to come for your Medicare Wellness Visit. I appreciate your ongoing commitment to your health goals. Please review the following plan we discussed and let me know if I can assist you in the future.   Screening recommendations/referrals: Colonoscopy: no longer required Mammogram: no longer required Bone Density: up to date Recommended yearly ophthalmology/optometry visit for glaucoma screening and checkup Recommended yearly dental visit for hygiene and checkup  Vaccinations: Influenza vaccine: up to date, due 11/2016 Pneumococcal vaccine: up to date Tdap vaccine: due, check with your insurance company for coverage Shingles vaccine: due, check with your insurance company for coverage  Advanced directives: Advance directive discussed with you today. I have provided a copy for you to complete at home and have notarized. Once this is complete please bring a copy in to our office so we can scan it into your chart.  Conditions/risks identified: Falls preventions discussed  Recommend drinking at least 6-7 glasses of water a day   Next appointment: Follow up on 11/10/2016 at 11:00am with Dr.Johnson. Follow up in one year for your annual wellness exam.    Preventive Care 65 Years and Older, Female Preventive care refers to lifestyle choices and visits with your health care provider that can promote health and wellness. What does preventive care include?  A yearly physical exam. This is also called an annual well check.  Dental exams once or twice a year.  Routine eye exams. Ask your health care provider how often you should have your eyes checked.  Personal lifestyle choices, including:  Daily care of your teeth and gums.  Regular physical activity.  Eating a healthy diet.  Avoiding tobacco and drug use.  Limiting alcohol use.  Practicing safe sex.  Taking low-dose aspirin every day.  Taking vitamin and mineral supplements as recommended  by your health care provider. What happens during an annual well check? The services and screenings done by your health care provider during your annual well check will depend on your age, overall health, lifestyle risk factors, and family history of disease. Counseling  Your health care provider may ask you questions about your:  Alcohol use.  Tobacco use.  Drug use.  Emotional well-being.  Home and relationship well-being.  Sexual activity.  Eating habits.  History of falls.  Memory and ability to understand (cognition).  Work and work Statistician.  Reproductive health. Screening  You may have the following tests or measurements:  Height, weight, and BMI.  Blood pressure.  Lipid and cholesterol levels. These may be checked every 5 years, or more frequently if you are over 28 years old.  Skin check.  Lung cancer screening. You may have this screening every year starting at age 22 if you have a 30-pack-year history of smoking and currently smoke or have quit within the past 15 years.  Fecal occult blood test (FOBT) of the stool. You may have this test every year starting at age 22.  Flexible sigmoidoscopy or colonoscopy. You may have a sigmoidoscopy every 5 years or a colonoscopy every 10 years starting at age 70.  Hepatitis C blood test.  Hepatitis B blood test.  Sexually transmitted disease (STD) testing.  Diabetes screening. This is done by checking your blood sugar (glucose) after you have not eaten for a while (fasting). You may have this done every 1-3 years.  Bone density scan. This is done to screen for osteoporosis. You may have this done starting at age 26.  Mammogram. This  may be done every 1-2 years. Talk to your health care provider about how often you should have regular mammograms. Talk with your health care provider about your test results, treatment options, and if necessary, the need for more tests. Vaccines  Your health care provider may  recommend certain vaccines, such as:  Influenza vaccine. This is recommended every year.  Tetanus, diphtheria, and acellular pertussis (Tdap, Td) vaccine. You may need a Td booster every 10 years.  Zoster vaccine. You may need this after age 34.  Pneumococcal 13-valent conjugate (PCV13) vaccine. One dose is recommended after age 24.  Pneumococcal polysaccharide (PPSV23) vaccine. One dose is recommended after age 61. Talk to your health care provider about which screenings and vaccines you need and how often you need them. This information is not intended to replace advice given to you by your health care provider. Make sure you discuss any questions you have with your health care provider. Document Released: 04/11/2015 Document Revised: 12/03/2015 Document Reviewed: 01/14/2015 Elsevier Interactive Patient Education  2017 [place] Prevention in the Home Falls can cause injuries. They can happen to people of all ages. There are many things you can do to make your home safe and to help prevent falls. What can I do on the outside of my home?  Regularly fix the edges of walkways and driveways and fix any cracks.  Remove anything that might make you trip as you walk through a door, such as a raised step or threshold.  Trim any bushes or trees on the path to your home.  Use bright outdoor lighting.  Clear any walking paths of anything that might make someone trip, such as rocks or tools.  Regularly check to see if handrails are loose or broken. Make sure that both sides of any steps have handrails.  Any raised decks and porches should have guardrails on the edges.  Have any leaves, snow, or ice cleared regularly.  Use sand or salt on walking paths during winter.  Clean up any spills in your garage right away. This includes oil or grease spills. What can I do in the bathroom?  Use night lights.  Install grab bars by the toilet and in the tub and shower. Do not use towel  bars as grab bars.  Use non-skid mats or decals in the tub or shower.  If you need to sit down in the shower, use a plastic, non-slip stool.  Keep the floor dry. Clean up any water that spills on the floor as soon as it happens.  Remove soap buildup in the tub or shower regularly.  Attach bath mats securely with double-sided non-slip rug tape.  Do not have throw rugs and other things on the floor that can make you trip. What can I do in the bedroom?  Use night lights.  Make sure that you have a light by your bed that is easy to reach.  Do not use any sheets or blankets that are too big for your bed. They should not hang down onto the floor.  Have a firm chair that has side arms. You can use this for support while you get dressed.  Do not have throw rugs and other things on the floor that can make you trip. What can I do in the kitchen?  Clean up any spills right away.  Avoid walking on wet floors.  Keep items that you use a lot in easy-to-reach places.  If you need to reach something above  you, use a strong step stool that has a grab bar.  Keep electrical cords out of the way.  Do not use floor polish or wax that makes floors slippery. If you must use wax, use non-skid floor wax.  Do not have throw rugs and other things on the floor that can make you trip. What can I do with my stairs?  Do not leave any items on the stairs.  Make sure that there are handrails on both sides of the stairs and use them. Fix handrails that are broken or loose. Make sure that handrails are as long as the stairways.  Check any carpeting to make sure that it is firmly attached to the stairs. Fix any carpet that is loose or worn.  Avoid having throw rugs at the top or bottom of the stairs. If you do have throw rugs, attach them to the floor with carpet tape.  Make sure that you have a light switch at the top of the stairs and the bottom of the stairs. If you do not have them, ask someone to  add them for you. What else can I do to help prevent falls?  Wear shoes that:  Do not have high heels.  Have rubber bottoms.  Are comfortable and fit you well.  Are closed at the toe. Do not wear sandals.  If you use a stepladder:  Make sure that it is fully opened. Do not climb a closed stepladder.  Make sure that both sides of the stepladder are locked into place.  Ask someone to hold it for you, if possible.  Clearly mark and make sure that you can see:  Any grab bars or handrails.  First and last steps.  Where the edge of each step is.  Use tools that help you move around (mobility aids) if they are needed. These include:  Canes.  Walkers.  Scooters.  Crutches.  Turn on the lights when you go into a dark area. Replace any light bulbs as soon as they burn out.  Set up your furniture so you have a clear path. Avoid moving your furniture around.  If any of your floors are uneven, fix them.  If there are any pets around you, be aware of where they are.  Review your medicines with your doctor. Some medicines can make you feel dizzy. This can increase your chance of falling. Ask your doctor what other things that you can do to help prevent falls. This information is not intended to replace advice given to you by your health care provider. Make sure you discuss any questions you have with your health care provider. Document Released: 01/09/2009 Document Revised: 08/21/2015 Document Reviewed: 04/19/2014 Elsevier Interactive Patient Education  2017 [employer].

## 2016-11-10 ENCOUNTER — Ambulatory Visit (INDEPENDENT_AMBULATORY_CARE_PROVIDER_SITE_OTHER): Payer: PPO | Admitting: Family Medicine

## 2016-11-10 ENCOUNTER — Encounter: Payer: Self-pay | Admitting: Family Medicine

## 2016-11-10 VITALS — BP 137/75 | HR 61 | Temp 97.6°F | Wt 144.1 lb

## 2016-11-10 DIAGNOSIS — H539 Unspecified visual disturbance: Secondary | ICD-10-CM | POA: Diagnosis not present

## 2016-11-10 DIAGNOSIS — E785 Hyperlipidemia, unspecified: Secondary | ICD-10-CM

## 2016-11-10 DIAGNOSIS — S81801A Unspecified open wound, right lower leg, initial encounter: Secondary | ICD-10-CM

## 2016-11-10 DIAGNOSIS — N183 Chronic kidney disease, stage 3 unspecified: Secondary | ICD-10-CM

## 2016-11-10 DIAGNOSIS — E782 Mixed hyperlipidemia: Secondary | ICD-10-CM

## 2016-11-10 DIAGNOSIS — I10 Essential (primary) hypertension: Secondary | ICD-10-CM | POA: Diagnosis not present

## 2016-11-10 DIAGNOSIS — E039 Hypothyroidism, unspecified: Secondary | ICD-10-CM

## 2016-11-10 MED ORDER — BENAZEPRIL HCL 40 MG PO TABS: 40.0000 mg | ORAL_TABLET | Freq: Every day | ORAL | 4 refills | Status: DC

## 2016-11-10 MED ORDER — LOVASTATIN 20 MG PO TABS: 20.0000 mg | ORAL_TABLET | Freq: Every day | ORAL | 4 refills | Status: DC

## 2016-11-10 MED ORDER — HYDROCHLOROTHIAZIDE 25 MG PO TABS: 25.0000 mg | ORAL_TABLET | Freq: Every day | ORAL | 4 refills | Status: DC

## 2016-11-10 MED ORDER — TRIAMCINOLONE ACETONIDE 0.5 % EX OINT: 1.0000 "application " | TOPICAL_OINTMENT | Freq: Two times a day (BID) | CUTANEOUS | 0 refills | Status: DC

## 2016-11-10 NOTE — Assessment & Plan Note (Signed)
Under good control.  Rechecking labs today. Continue current regimen. Continue to monitor.

## 2016-11-10 NOTE — Progress Notes (Signed)
BP 137/75 (BP Location: Left Arm, Patient Position: Sitting, Cuff Size: Normal)   Pulse 61   Temp 97.6 F (36.4 C)   Wt 144 lb 2 oz (65.4 kg)   LMP 05/13/1980   SpO2 97%   BMI 25.53 kg/m    Subjective:    Patient ID: Cassie Brewer, female    DOB: 04-08-1928, 83 y.o.   MRN: [id number]  HPI: Cassie Brewer is a 83 y.o. female  Chief Complaint  Patient presents with  . Hypertension  . Hypothyroidism  . Hyperlipidemia   HYPERTENSION / HYPERLIPIDEMIA Satisfied with current treatment? yes Duration of hypertension: chronic BP monitoring frequency: not checking BP range:  BP medication side effects: no Past BP meds: benazepril, HCTZ Duration of hyperlipidemia: chronic Cholesterol medication side effects: no Cholesterol supplements: none Past cholesterol medications: lovastatin Medication compliance: excellent compliance Aspirin: no Recent stressors: no Recurrent headaches: no Visual changes: no Palpitations: no Dyspnea: no Chest pain: no Lower extremity edema: no Dizzy/lightheaded: no  HYPOTHYROIDISM Thyroid control status:stable Satisfied with current treatment? yes Medication side effects: no Medication compliance: excellent compliance Etiology of hypothyroidism: hashimotos Recent dose adjustment:no Fatigue: no Cold intolerance: no Heat intolerance: no Weight gain: no Weight loss: no Constipation: no Diarrhea/loose stools: no Palpitations: no Lower extremity edema: no Anxiety/depressed mood: no  Relevant past medical, surgical, family and social history reviewed and updated as indicated. Interim medical history since our last visit reviewed. Allergies and medications reviewed and updated.  Review of Systems  Constitutional: Negative.   Respiratory: Negative.   Cardiovascular: Negative.   Skin: Positive for rash and wound (well healing wound of R shin). Negative for color change and pallor.  Psychiatric/Behavioral: Negative.     Per HPI unless  specifically indicated above     Objective:    BP 137/75 (BP Location: Left Arm, Patient Position: Sitting, Cuff Size: Normal)   Pulse 61   Temp 97.6 F (36.4 C)   Wt 144 lb 2 oz (65.4 kg)   LMP 05/13/1980   SpO2 97%   BMI 25.53 kg/m   Wt Readings from Last 3 Encounters:  11/10/16 144 lb 2 oz (65.4 kg)  11/03/16 145 lb 1.6 oz (65.8 kg)  09/21/16 145 lb 11.2 oz (66.1 kg)    Physical Exam  Constitutional: She is oriented to person, place, and time. She appears well-developed and well-nourished. No distress.  HENT:  Head: Normocephalic and atraumatic.  Right Ear: Hearing normal.  Left Ear: Hearing normal.  Nose: Nose normal.  Eyes: Conjunctivae and lids are normal. Right eye exhibits no discharge. Left eye exhibits no discharge. No scleral icterus.  Cardiovascular: Normal rate, regular rhythm, normal heart sounds and intact distal pulses.  Exam reveals no gallop and no friction rub.   No murmur heard. Pulmonary/Chest: Effort normal and breath sounds normal. No respiratory distress. She has no wheezes. She has no rales. She exhibits no tenderness.  Abdominal: Soft. Bowel sounds are normal. She exhibits no distension and no mass. There is no tenderness. There is no rebound and no guarding.  Musculoskeletal: Normal range of motion.  Neurological: She is alert and oriented to person, place, and time.  Skin: Skin is warm, dry and intact. No rash noted. No erythema. No pallor.  Open wound of R shin- healing well  Psychiatric: She has a normal mood and affect. Her speech is normal and behavior is normal. Judgment and thought content normal. Cognition and memory are normal.  Nursing note and vitals reviewed.  Results for orders placed or performed in visit on 94/49/67  Basic metabolic panel  Result Value Ref Range   Glucose 101 (H) 65 - 99 mg/dL   BUN 19 8 - 27 mg/dL   Creatinine, Ser 0.88 0.57 - 1.00 mg/dL   GFR calc non Af Amer 60 >59 mL/min/1.73   GFR calc Af Amer 69 >59  mL/min/1.73   BUN/Creatinine Ratio 22 12 - 28   Sodium 139 134 - 144 mmol/L   Potassium 4.1 3.5 - 5.2 mmol/L   Chloride 99 96 - 106 mmol/L   CO2 27 18 - 29 mmol/L   Calcium 9.4 8.7 - 10.3 mg/dL  LP+ALT+AST Piccolo, Waived  Result Value Ref Range   ALT (SGPT) Piccolo, Waived 15 10 - 47 U/L   AST (SGOT) Piccolo, Waived 25 11 - 38 U/L   Cholesterol Piccolo, Waived 211 (H) <200 mg/dL   HDL Chol Piccolo, Waived 82 >59 mg/dL   Triglycerides Piccolo,Waived 77 <150 mg/dL   Chol/HDL Ratio Piccolo,Waive 2.6 mg/dL   LDL Chol Calc Piccolo Waived 113 (H) <100 mg/dL   VLDL Chol Calc Piccolo,Waive 15 <30 mg/dL      Assessment & Plan:   Problem List Items Addressed This Visit      Cardiovascular and Mediastinum   Hypertension - Primary    Under good control. Continue current regimen. Continue to monitor.       Relevant Medications   lovastatin (MEVACOR) 20 MG tablet   hydrochlorothiazide (HYDRODIURIL) 25 MG tablet   benazepril (LOTENSIN) 40 MG tablet   Other Relevant Orders   CBC with Differential/Platelet   Comprehensive metabolic panel   Microalbumin, Urine Waived   UA/M w/rflx Culture, Routine     Endocrine   Hypothyroidism    Under good control.  Rechecking labs today. Continue current regimen. Continue to monitor.       Relevant Orders   CBC with Differential/Platelet   Comprehensive metabolic panel   TSH   UA/M w/rflx Culture, Routine     Genitourinary   CKD (chronic kidney disease), stage III    Under good control.  Rechecking labs today. Continue current regimen. Continue to monitor.       Relevant Orders   CBC with Differential/Platelet   Comprehensive metabolic panel   Microalbumin, Urine Waived   UA/M w/rflx Culture, Routine     Other   Hyperlipidemia    Under good control.  Rechecking labs today. Continue current regimen. Continue to monitor.       Relevant Medications   lovastatin (MEVACOR) 20 MG tablet   hydrochlorothiazide (HYDRODIURIL) 25 MG tablet     benazepril (LOTENSIN) 40 MG tablet   Other Relevant Orders   CBC with Differential/Platelet   Comprehensive metabolic panel   Lipid Panel w/o Chol/HDL Ratio   UA/M w/rflx Culture, Routine    Other Visit Diagnoses    Open wound of right lower extremity, initial encounter       Due for Td. Given today.   Relevant Orders   Td : Tetanus/diphtheria >7yo Preservative  free (Completed)   Visual changes       ? migraine aura. Advised follow up with her eye doctor. Call with any concerns.        Follow up plan: Return in about 6 months (around 05/13/2017) for Follow up BP/Cholesterol.

## 2016-11-10 NOTE — Patient Instructions (Addendum)
Health Maintenance, Female Adopting a healthy lifestyle and getting preventive care can go a long way to promote health and wellness. Talk with your health care provider about what schedule of regular examinations is right for you. This is a good chance for you to check in with your provider about disease prevention and staying healthy. In between checkups, there are plenty of things you can do on your own. Experts have done a lot of research about which lifestyle changes and preventive measures are most likely to keep you healthy. Ask your health care provider for more information. Weight and diet Eat a healthy diet  Be sure to include plenty of vegetables, fruits, low-fat dairy products, and lean protein.  Do not eat a lot of foods high in solid fats, added sugars, or salt.  Get regular exercise. This is one of the most important things you can do for your health. ? Most adults should exercise for at least 150 minutes each week. The exercise should increase your heart rate and make you sweat (moderate-intensity exercise). ? Most adults should also do strengthening exercises at least twice a week. This is in addition to the moderate-intensity exercise.  Maintain a healthy weight  Body mass index (BMI) is a measurement that can be used to identify possible weight problems. It estimates body fat based on height and weight. Your health care provider can help determine your BMI and help you achieve or maintain a healthy weight.  For females 20 years of age and older: ? A BMI below 18.5 is considered underweight. ? A BMI of 18.5 to 24.9 is normal. ? A BMI of 25 to 29.9 is considered overweight. ? A BMI of 30 and above is considered obese.  Watch levels of cholesterol and blood lipids  You should start having your blood tested for lipids and cholesterol at 81 years of age, then have this test every 5 years.  You may need to have your cholesterol levels checked more often if: ? Your lipid or  cholesterol levels are high. ? You are older than 81 years of age. ? You are at high risk for heart disease.  Cancer screening Lung Cancer  Lung cancer screening is recommended for adults 55-80 years old who are at high risk for lung cancer because of a history of smoking.  A yearly low-dose CT scan of the lungs is recommended for people who: ? Currently smoke. ? Have quit within the past 15 years. ? Have at least a 30-pack-year history of smoking. A pack year is smoking an average of one pack of cigarettes a day for 1 year.  Yearly screening should continue until it has been 15 years since you quit.  Yearly screening should stop if you develop a health problem that would prevent you from having lung cancer treatment.  Breast Cancer  Practice breast self-awareness. This means understanding how your breasts normally appear and feel.  It also means doing regular breast self-exams. Let your health care provider know about any changes, no matter how small.  If you are in your 20s or 30s, you should have a clinical breast exam (CBE) by a health care provider every 1-3 years as part of a regular health exam.  If you are 40 or older, have a CBE every year. Also consider having a breast X-ray (mammogram) every year.  If you have a family history of breast cancer, talk to your health care provider about genetic screening.  If you are at high risk   for breast cancer, talk to your health care provider about having an MRI and a mammogram every year.  Breast cancer gene (BRCA) assessment is recommended for women who have family members with BRCA-related cancers. BRCA-related cancers include: ? Breast. ? Ovarian. ? Tubal. ? Peritoneal cancers.  Results of the assessment will determine the need for genetic counseling and BRCA1 and BRCA2 testing.  Cervical Cancer Your health care provider may recommend that you be screened regularly for cancer of the pelvic organs (ovaries, uterus, and  vagina). This screening involves a pelvic examination, including checking for microscopic changes to the surface of your cervix (Pap test). You may be encouraged to have this screening done every 3 years, beginning at age 22.  For women ages 56-65, health care providers may recommend pelvic exams and Pap testing every 3 years, or they may recommend the Pap and pelvic exam, combined with testing for human papilloma virus (HPV), every 5 years. Some types of HPV increase your risk of cervical cancer. Testing for HPV may also be done on women of any age with unclear Pap test results.  Other health care providers may not recommend any screening for nonpregnant women who are considered low risk for pelvic cancer and who do not have symptoms. Ask your health care provider if a screening pelvic exam is right for you.  If you have had past treatment for cervical cancer or a condition that could lead to cancer, you need Pap tests and screening for cancer for at least 20 years after your treatment. If Pap tests have been discontinued, your risk factors (such as having a new sexual partner) need to be reassessed to determine if screening should resume. Some women have medical problems that increase the chance of getting cervical cancer. In these cases, your health care provider may recommend more frequent screening and Pap tests.  Colorectal Cancer  This type of cancer can be detected and often prevented.  Routine colorectal cancer screening usually begins at 81 years of age and continues through 81 years of age.  Your health care provider may recommend screening at an earlier age if you have risk factors for colon cancer.  Your health care provider may also recommend using home test kits to check for hidden blood in the stool.  A small camera at the end of a tube can be used to examine your colon directly (sigmoidoscopy or colonoscopy). This is done to check for the earliest forms of colorectal  cancer.  Routine screening usually begins at age 33.  Direct examination of the colon should be repeated every 5-10 years through 81 years of age. However, you may need to be screened more often if early forms of precancerous polyps or small growths are found.  Skin Cancer  Check your skin from head to toe regularly.  Tell your health care provider about any new moles or changes in moles, especially if there is a change in a mole's shape or color.  Also tell your health care provider if you have a mole that is larger than the size of a pencil eraser.  Always use sunscreen. Apply sunscreen liberally and repeatedly throughout the day.  Protect yourself by wearing long sleeves, pants, a wide-brimmed hat, and sunglasses whenever you are outside.  Heart disease, diabetes, and high blood pressure  High blood pressure causes heart disease and increases the risk of stroke. High blood pressure is more likely to develop in: ? People who have blood pressure in the high end of  the normal range (130-139/85-89 mm Hg). ? People who are overweight or obese. ? People who are African American.  If you are 21-29 years of age, have your blood pressure checked every 3-5 years. If you are 3 years of age or older, have your blood pressure checked every year. You should have your blood pressure measured twice-once when you are at a hospital or clinic, and once when you are not at a hospital or clinic. Record the average of the two measurements. To check your blood pressure when you are not at a hospital or clinic, you can use: ? An automated blood pressure machine at a pharmacy. ? A home blood pressure monitor.  If you are between 17 years and 37 years old, ask your health care provider if you should take aspirin to prevent strokes.  Have regular diabetes screenings. This involves taking a blood sample to check your fasting blood sugar level. ? If you are at a normal weight and have a low risk for diabetes,  have this test once every three years after 81 years of age. ? If you are overweight and have a high risk for diabetes, consider being tested at a younger age or more often. Preventing infection Hepatitis B  If you have a higher risk for hepatitis B, you should be screened for this virus. You are considered at high risk for hepatitis B if: ? You were born in a country where hepatitis B is common. Ask your health care provider which countries are considered high risk. ? Your parents were born in a high-risk country, and you have not been immunized against hepatitis B (hepatitis B vaccine). ? You have HIV or AIDS. ? You use needles to inject street drugs. ? You live with someone who has hepatitis B. ? You have had sex with someone who has hepatitis B. ? You get hemodialysis treatment. ? You take certain medicines for conditions, including cancer, organ transplantation, and autoimmune conditions.  Hepatitis C  Blood testing is recommended for: ? Everyone born from [birth year] through 1963. ? Anyone with known risk factors for hepatitis C.  Sexually transmitted infections (STIs)  You should be screened for sexually transmitted infections (STIs) including gonorrhea and chlamydia if: ? You are sexually active and are younger than 81 years of age. ? You are older than 81 years of age and your health care provider tells you that you are at risk for this type of infection. ? Your sexual activity has changed since you were last screened and you are at an increased risk for chlamydia or gonorrhea. Ask your health care provider if you are at risk.  If you do not have HIV, but are at risk, it may be recommended that you take a prescription medicine daily to prevent HIV infection. This is called pre-exposure prophylaxis (PrEP). You are considered at risk if: ? You are sexually active and do not regularly use condoms or know the HIV status of your partner(s). ? You take drugs by injection. ? You are  sexually active with a partner who has HIV.  Talk with your health care provider about whether you are at high risk of being infected with HIV. If you choose to begin PrEP, you should first be tested for HIV. You should then be tested every 3 months for as long as you are taking PrEP. Pregnancy  If you are premenopausal and you may become pregnant, ask your health care provider about preconception counseling.  If you may become  pregnant, take 400 to 800 micrograms (mcg) of folic acid every day.  If you want to prevent pregnancy, talk to your health care provider about birth control (contraception). Osteoporosis and menopause  Osteoporosis is a disease in which the bones lose minerals and strength with aging. This can result in serious bone fractures. Your risk for osteoporosis can be identified using a bone density scan.  If you are 28 years of age or older, or if you are at risk for osteoporosis and fractures, ask your health care provider if you should be screened.  Ask your health care provider whether you should take a calcium or vitamin D supplement to lower your risk for osteoporosis.  Menopause may have certain physical symptoms and risks.  Hormone replacement therapy may reduce some of these symptoms and risks. Talk to your health care provider about whether hormone replacement therapy is right for you. Follow these instructions at home:  Schedule regular health, dental, and eye exams.  Stay current with your immunizations.  Do not use any tobacco products including cigarettes, chewing tobacco, or electronic cigarettes.  If you are pregnant, do not drink alcohol.  If you are breastfeeding, limit how much and how often you drink alcohol.  Limit alcohol intake to no more than 1 drink per day for nonpregnant women. One drink equals 12 ounces of beer, 5 ounces of wine, or 1 ounces of hard liquor.  Do not use street drugs.  Do not share needles.  Ask your health care  provider for help if you need support or information about quitting drugs.  Tell your health care provider if you often feel depressed.  Tell your health care provider if you have ever been abused or do not feel safe at home. This information is not intended to replace advice given to you by your health care provider. Make sure you discuss any questions you have with your health care provider. Document Released: 09/28/2010 Document Revised: 08/21/2015 Document Reviewed: 12/17/2014 Elsevier Interactive Patient Education  2018 [place] Maintenance for Postmenopausal Women Menopause is a normal process in which your reproductive ability comes to an end. This process happens gradually over a span of months to years, usually between the ages of 96 and 42. Menopause is complete when you have missed 12 consecutive menstrual periods. It is important to talk with your health care provider about some of the most common conditions that affect postmenopausal women, such as heart disease, cancer, and bone loss (osteoporosis). Adopting a healthy lifestyle and getting preventive care can help to promote your health and wellness. Those actions can also lower your chances of developing some of these common conditions. What should I know about menopause? During menopause, you may experience a number of symptoms, such as:  Moderate-to-severe hot flashes.  Night sweats.  Decrease in sex drive.  Mood swings.  Headaches.  Tiredness.  Irritability.  Memory problems.  Insomnia.  Choosing to treat or not to treat menopausal changes is an individual decision that you make with your health care provider. What should I know about hormone replacement therapy and supplements? Hormone therapy products are effective for treating symptoms that are associated with menopause, such as hot flashes and night sweats. Hormone replacement carries certain risks, especially as you become older. If you are  thinking about using estrogen or estrogen with progestin treatments, discuss the benefits and risks with your health care provider. What should I know about heart disease and stroke? Heart disease, heart attack, and stroke  become more likely as you age. This may be due, in part, to the hormonal changes that your body experiences during menopause. These can affect how your body processes dietary fats, triglycerides, and cholesterol. Heart attack and stroke are both medical emergencies. There are many things that you can do to help prevent heart disease and stroke:  Have your blood pressure checked at least every 1-2 years. High blood pressure causes heart disease and increases the risk of stroke.  If you are 55-79 years old, ask your health care provider if you should take aspirin to prevent a heart attack or a stroke.  Do not use any tobacco products, including cigarettes, chewing tobacco, or electronic cigarettes. If you need help quitting, ask your health care provider.  It is important to eat a healthy diet and maintain a healthy weight. ? Be sure to include plenty of vegetables, fruits, low-fat dairy products, and lean protein. ? Avoid eating foods that are high in solid fats, added sugars, or salt (sodium).  Get regular exercise. This is one of the most important things that you can do for your health. ? Try to exercise for at least 150 minutes each week. The type of exercise that you do should increase your heart rate and make you sweat. This is known as moderate-intensity exercise. ? Try to do strengthening exercises at least twice each week. Do these in addition to the moderate-intensity exercise.  Know your numbers.Ask your health care provider to check your cholesterol and your blood glucose. Continue to have your blood tested as directed by your health care provider.  What should I know about cancer screening? There are several types of cancer. Take the following steps to reduce  your risk and to catch any cancer development as early as possible. Breast Cancer  Practice breast self-awareness. ? This means understanding how your breasts normally appear and feel. ? It also means doing regular breast self-exams. Let your health care provider know about any changes, no matter how small.  If you are 40 or older, have a clinician do a breast exam (clinical breast exam or CBE) every year. Depending on your age, family history, and medical history, it may be recommended that you also have a yearly breast X-ray (mammogram).  If you have a family history of breast cancer, talk with your health care provider about genetic screening.  If you are at high risk for breast cancer, talk with your health care provider about having an MRI and a mammogram every year.  Breast cancer (BRCA) gene test is recommended for women who have family members with BRCA-related cancers. Results of the assessment will determine the need for genetic counseling and BRCA1 and for BRCA2 testing. BRCA-related cancers include these types: ? Breast. This occurs in males or females. ? Ovarian. ? Tubal. This may also be called fallopian tube cancer. ? Cancer of the abdominal or pelvic lining (peritoneal cancer). ? Prostate. ? Pancreatic.  Cervical, Uterine, and Ovarian Cancer Your health care provider may recommend that you be screened regularly for cancer of the pelvic organs. These include your ovaries, uterus, and vagina. This screening involves a pelvic exam, which includes checking for microscopic changes to the surface of your cervix (Pap test).  For women ages 21-65, health care providers may recommend a pelvic exam and a Pap test every three years. For women ages 30-65, they may recommend the Pap test and pelvic exam, combined with testing for human papilloma virus (HPV), every five years. Some types   of HPV increase your risk of cervical cancer. Testing for HPV may also be done on women of any age who  have unclear Pap test results.  Other health care providers may not recommend any screening for nonpregnant women who are considered low risk for pelvic cancer and have no symptoms. Ask your health care provider if a screening pelvic exam is right for you.  If you have had past treatment for cervical cancer or a condition that could lead to cancer, you need Pap tests and screening for cancer for at least 20 years after your treatment. If Pap tests have been discontinued for you, your risk factors (such as having a new sexual partner) need to be reassessed to determine if you should start having screenings again. Some women have medical problems that increase the chance of getting cervical cancer. In these cases, your health care provider may recommend that you have screening and Pap tests more often.  If you have a family history of uterine cancer or ovarian cancer, talk with your health care provider about genetic screening.  If you have vaginal bleeding after reaching menopause, tell your health care provider.  There are currently no reliable tests available to screen for ovarian cancer.  Lung Cancer Lung cancer screening is recommended for adults 20-34 years old who are at high risk for lung cancer because of a history of smoking. A yearly low-dose CT scan of the lungs is recommended if you:  Currently smoke.  Have a history of at least 30 pack-years of smoking and you currently smoke or have quit within the past 15 years. A pack-year is smoking an average of one pack of cigarettes per day for one year.  Yearly screening should:  Continue until it has been 15 years since you quit.  Stop if you develop a health problem that would prevent you from having lung cancer treatment.  Colorectal Cancer  This type of cancer can be detected and can often be prevented.  Routine colorectal cancer screening usually begins at age 91 and continues through age 40.  If you have risk factors for colon  cancer, your health care provider may recommend that you be screened at an earlier age.  If you have a family history of colorectal cancer, talk with your health care provider about genetic screening.  Your health care provider may also recommend using home test kits to check for hidden blood in your stool.  A small camera at the end of a tube can be used to examine your colon directly (sigmoidoscopy or colonoscopy). This is done to check for the earliest forms of colorectal cancer.  Direct examination of the colon should be repeated every 5-10 years until age 59. However, if early forms of precancerous polyps or small growths are found or if you have a family history or genetic risk for colorectal cancer, you may need to be screened more often.  Skin Cancer  Check your skin from head to toe regularly.  Monitor any moles. Be sure to tell your health care provider: ? About any new moles or changes in moles, especially if there is a change in a mole's shape or color. ? If you have a mole that is larger than the size of a pencil eraser.  If any of your family members has a history of skin cancer, especially at a young age, talk with your health care provider about genetic screening.  Always use sunscreen. Apply sunscreen liberally and repeatedly throughout the day.  Whenever you are outside, protect yourself by wearing long sleeves, pants, a wide-brimmed hat, and sunglasses.  What should I know about osteoporosis? Osteoporosis is a condition in which bone destruction happens more quickly than new bone creation. After menopause, you may be at an increased risk for osteoporosis. To help prevent osteoporosis or the bone fractures that can happen because of osteoporosis, the following is recommended:  If you are 59-53 years old, get at least 1,000 mg of calcium and at least 600 mg of vitamin D per day.  If you are older than age 66 but younger than age 4, get at least 1,200 mg of calcium and  at least 600 mg of vitamin D per day.  If you are older than age 57, get at least 1,200 mg of calcium and at least 800 mg of vitamin D per day.  Smoking and excessive alcohol intake increase the risk of osteoporosis. Eat foods that are rich in calcium and vitamin D, and do weight-bearing exercises several times each week as directed by your health care provider. What should I know about how menopause affects my mental health? Depression may occur at any age, but it is more common as you become older. Common symptoms of depression include:  Low or sad mood.  Changes in sleep patterns.  Changes in appetite or eating patterns.  Feeling an overall lack of motivation or enjoyment of activities that you previously enjoyed.  Frequent crying spells.  Talk with your health care provider if you think that you are experiencing depression. What should I know about immunizations? It is important that you get and maintain your immunizations. These include:  Tetanus, diphtheria, and pertussis (Tdap) booster vaccine.  Influenza every year before the flu season begins.  Pneumonia vaccine.  Shingles vaccine.  Your health care provider may also recommend other immunizations. This information is not intended to replace advice given to you by your health care provider. Make sure you discuss any questions you have with your health care provider. Document Released: 05/07/2005 Document Revised: 10/03/2015 Document Reviewed: 12/17/2014 Elsevier Interactive Patient Education  2018 [place] (Tetanus and Diphtheria): What You Need to Know 1. Why get vaccinated? Tetanus  and diphtheria are very serious diseases. They are rare in the [place] today, but people who do become infected often have severe complications. Td vaccine is used to protect adolescents and adults from both of these diseases. Both tetanus and diphtheria are infections caused by bacteria. Diphtheria spreads from person  to person through coughing or sneezing. Tetanus-causing bacteria enter the body through cuts, scratches, or wounds. TETANUS (lockjaw) causes painful muscle tightening and stiffness, usually all over the body.  It can lead to tightening of muscles in the head and neck so you can't open your mouth, swallow, or sometimes even breathe. Tetanus kills about 1 out of every 10 people who are infected even after receiving the best medical care.  DIPHTHERIA can cause a thick coating to form in the back of the throat.  It can lead to breathing problems, paralysis, heart failure, and death.  Before vaccines, as many as 200,000 cases of diphtheria and hundreds of cases of tetanus were reported in the [place] each year. Since vaccination began, reports of cases for both diseases have dropped by about 99%. 2. Td vaccine Td vaccine can protect adolescents and adults from tetanus and diphtheria. Td is usually given as a booster dose every 10 years but it can also be given earlier after  a severe and dirty wound or burn. Another vaccine, called Tdap, which protects against pertussis in addition to tetanus and diphtheria, is sometimes recommended instead of Td vaccine. Your doctor or the person giving you the vaccine can give you more information. Td may safely be given at the same time as other vaccines. 3. Some people should not get this vaccine  A person who has ever had a life-threatening allergic reaction after a previous dose of any tetanus or diphtheria containing vaccine, OR has a severe allergy to any part of this vaccine, should not get Td vaccine. Tell the person giving the vaccine about any severe allergies.  Talk to your doctor if you: ? had severe pain or swelling after any vaccine containing diphtheria or tetanus, ? ever had a condition called Guillain Barre Syndrome (GBS), ? aren't feeling well on the day the shot is scheduled. 4. What are the risks from Td vaccine? With any medicine,  including vaccines, there is a chance of side effects. These are usually mild and go away on their own. Serious reactions are also possible but are rare. Most people who get Td vaccine do not have any problems with it. Mild problems following Td vaccine: (Did not interfere with activities)  Pain where the shot was given (about 8 people in 10)  Redness or swelling where the shot was given (about 1 person in 4)  Mild fever (rare)  Headache (about 1 person in 4)  Tiredness (about 1 person in 4)  Moderate problems following Td vaccine: (Interfered with activities, but did not require medical attention)  Fever over 102F (rare)  Severe problems following Td vaccine: (Unable to perform usual activities; required medical attention)  Swelling, severe pain, bleeding and/or redness in the arm where the shot was given (rare).  Problems that could happen after any vaccine:  People sometimes faint after a medical procedure, including vaccination. Sitting or lying down for about 15 minutes can help prevent fainting, and injuries caused by a fall. Tell your doctor if you feel dizzy, or have vision changes or ringing in the ears.  Some people get severe pain in the shoulder and have difficulty moving the arm where a shot was given. This happens very rarely.  Any medication can cause a severe allergic reaction. Such reactions from a vaccine are very rare, estimated at fewer than 1 in a million doses, and would happen within a few minutes to a few hours after the vaccination. As with any medicine, there is a very remote chance of a vaccine causing a serious injury or death. The safety of vaccines is always being monitored. For more information, visit: http://www.aguilar.org/ 5. What if there is a serious reaction? What should I look for? Look for anything that concerns you, such as signs of a severe allergic reaction, very high fever, or unusual behavior. Signs of a severe allergic reaction can  include hives, swelling of the face and throat, difficulty breathing, a fast heartbeat, dizziness, and weakness. These would usually start a few minutes to a few hours after the vaccination. What should I do?  If you think it is a severe allergic reaction or other emergency that can't wait, call 9-1-1 or get the person to the nearest hospital. Otherwise, call your doctor.  Afterward, the reaction should be reported to the Vaccine Adverse Event Reporting System (VAERS). Your doctor might file this report, or you can do it yourself through the VAERS web site at www.vaers.SamedayNews.es, or by calling 859 696 0713. ? VAERS  does not give medical advice. 6. The National Vaccine Injury Compensation Program The [employer] Vaccine Injury Compensation Program (VICP) is a federal program that was created to compensate people who may have been injured by certain vaccines. Persons who believe they may have been injured by a vaccine can learn about the program and about filing a claim by calling 2068298705 or visiting the [place] website at GoldCloset.com.ee. There is a time limit to file a claim for compensation. 7. How can I learn more?  Ask your doctor. He or she can give you the vaccine package insert or suggest other sources of information.  Call your local or state health department.  Contact the Centers for Disease Control and Prevention (CDC): ? Call (763) 318-0350 (1-800-CDC-INFO) ? Visit CDC's website at http://hunter.com/ CDC Td Vaccine VIS (07/08/15) This information is not intended to replace advice given to you by your health care provider. Make sure you discuss any questions you have with your health care provider. Document Released: 01/10/2006 Document Revised: 12/04/2015 Document Reviewed: 12/04/2015 Elsevier Interactive Patient Education  2017 [employer].

## 2016-11-10 NOTE — Assessment & Plan Note (Signed)
Under good control. Continue current regimen. Continue to monitor.  

## 2016-11-11 ENCOUNTER — Encounter: Payer: Self-pay | Admitting: Family Medicine

## 2016-11-11 LAB — UA/M W/RFLX CULTURE, ROUTINE
Bilirubin, UA: NEGATIVE
GLUCOSE, UA: NEGATIVE
KETONES UA: NEGATIVE
LEUKOCYTES UA: NEGATIVE
Nitrite, UA: NEGATIVE
Protein, UA: NEGATIVE
RBC, UA: NEGATIVE
SPEC GRAV UA: 1.015 (ref 1.005–1.030)
Urobilinogen, Ur: 0.2 mg/dL (ref 0.2–1.0)
pH, UA: 6.5 (ref 5.0–7.5)

## 2016-11-11 LAB — LIPID PANEL W/O CHOL/HDL RATIO
CHOLESTEROL TOTAL: 196 mg/dL (ref 100–199)
HDL: 68 mg/dL (ref >39)
LDL Calculated: 113 mg/dL — ABNORMAL HIGH (ref 0–99)
TRIGLYCERIDES: 74 mg/dL (ref 0–149)
VLDL Cholesterol Cal: 15 mg/dL (ref 5–40)

## 2016-11-11 LAB — COMPREHENSIVE METABOLIC PANEL
A/G RATIO: 1.6 (ref 1.2–2.2)
ALK PHOS: 56 IU/L (ref 39–117)
ALT: 12 IU/L (ref 0–32)
AST: 16 IU/L (ref 0–40)
Albumin: 4.2 g/dL (ref 3.5–4.7)
BUN/Creatinine Ratio: 21 (ref 12–28)
BUN: 20 mg/dL (ref 8–27)
Bilirubin Total: 0.5 mg/dL (ref 0.0–1.2)
CO2: 23 mmol/L (ref 20–29)
CREATININE: 0.97 mg/dL (ref 0.57–1.00)
Calcium: 9.5 mg/dL (ref 8.7–10.3)
Chloride: 102 mmol/L (ref 96–106)
GFR calc Af Amer: 61 mL/min/{1.73_m2} (ref >59)
GFR calc non Af Amer: 53 mL/min/{1.73_m2} — ABNORMAL LOW (ref >59)
GLOBULIN, TOTAL: 2.7 g/dL (ref 1.5–4.5)
Glucose: 91 mg/dL (ref 65–99)
POTASSIUM: 4.3 mmol/L (ref 3.5–5.2)
SODIUM: 140 mmol/L (ref 134–144)
Total Protein: 6.9 g/dL (ref 6.0–8.5)

## 2016-11-11 LAB — CBC WITH DIFFERENTIAL/PLATELET
Basophils Absolute: 0 10*3/uL (ref 0.0–0.2)
Basos: 1 %
EOS (ABSOLUTE): 0.1 10*3/uL (ref 0.0–0.4)
EOS: 2 %
HEMATOCRIT: 35.7 % (ref 34.0–46.6)
Hemoglobin: 12.3 g/dL (ref 11.1–15.9)
Immature Grans (Abs): 0 10*3/uL (ref 0.0–0.1)
Immature Granulocytes: 0 %
LYMPHS ABS: 1.5 10*3/uL (ref 0.7–3.1)
Lymphs: 28 %
MCH: 29 pg (ref 26.6–33.0)
MCHC: 34.5 g/dL (ref 31.5–35.7)
MCV: 84 fL (ref 79–97)
MONOS ABS: 0.5 10*3/uL (ref 0.1–0.9)
Monocytes: 9 %
Neutrophils Absolute: 3.3 10*3/uL (ref 1.4–7.0)
Neutrophils: 60 %
Platelets: 280 10*3/uL (ref 150–379)
RBC: 4.24 x10E6/uL (ref 3.77–5.28)
RDW: 14.4 % (ref 12.3–15.4)
WBC: 5.5 10*3/uL (ref 3.4–10.8)

## 2016-11-11 LAB — MICROSCOPIC EXAMINATION
Bacteria, UA: NONE SEEN
WBC, UA: NONE SEEN /hpf (ref 0–?)

## 2016-11-11 LAB — MICROALBUMIN, URINE WAIVED
CREATININE, URINE WAIVED: 100 mg/dL (ref 10–300)
MICROALB, UR WAIVED: 10 mg/L (ref 0–19)

## 2016-11-11 LAB — TSH: TSH: 1.53 u[IU]/mL (ref 0.450–4.500)

## 2017-01-24 ENCOUNTER — Other Ambulatory Visit: Payer: Self-pay | Admitting: Family Medicine

## 2017-01-24 DIAGNOSIS — E039 Hypothyroidism, unspecified: Secondary | ICD-10-CM

## 2017-01-28 DIAGNOSIS — H401131 Primary open-angle glaucoma, bilateral, mild stage: Secondary | ICD-10-CM | POA: Diagnosis not present

## 2017-05-05 DIAGNOSIS — L821 Other seborrheic keratosis: Secondary | ICD-10-CM | POA: Diagnosis not present

## 2017-05-05 DIAGNOSIS — Z08 Encounter for follow-up examination after completed treatment for malignant neoplasm: Secondary | ICD-10-CM | POA: Diagnosis not present

## 2017-05-05 DIAGNOSIS — L570 Actinic keratosis: Secondary | ICD-10-CM | POA: Diagnosis not present

## 2017-05-05 DIAGNOSIS — Z85828 Personal history of other malignant neoplasm of skin: Secondary | ICD-10-CM | POA: Diagnosis not present

## 2017-05-05 DIAGNOSIS — X32XXXA Exposure to sunlight, initial encounter: Secondary | ICD-10-CM | POA: Diagnosis not present

## 2017-05-05 DIAGNOSIS — L298 Other pruritus: Secondary | ICD-10-CM | POA: Diagnosis not present

## 2017-05-13 ENCOUNTER — Ambulatory Visit (INDEPENDENT_AMBULATORY_CARE_PROVIDER_SITE_OTHER): Payer: PPO | Admitting: Family Medicine

## 2017-05-13 ENCOUNTER — Encounter: Payer: Self-pay | Admitting: Family Medicine

## 2017-05-13 VITALS — BP 126/62 | HR 65 | Temp 98.5°F | Wt 149.1 lb

## 2017-05-13 DIAGNOSIS — N183 Chronic kidney disease, stage 3 unspecified: Secondary | ICD-10-CM

## 2017-05-13 DIAGNOSIS — Z23 Encounter for immunization: Secondary | ICD-10-CM | POA: Diagnosis not present

## 2017-05-13 DIAGNOSIS — E785 Hyperlipidemia, unspecified: Secondary | ICD-10-CM

## 2017-05-13 DIAGNOSIS — E063 Autoimmune thyroiditis: Secondary | ICD-10-CM

## 2017-05-13 DIAGNOSIS — I10 Essential (primary) hypertension: Secondary | ICD-10-CM

## 2017-05-13 DIAGNOSIS — I129 Hypertensive chronic kidney disease with stage 1 through stage 4 chronic kidney disease, or unspecified chronic kidney disease: Secondary | ICD-10-CM

## 2017-05-13 DIAGNOSIS — E038 Other specified hypothyroidism: Secondary | ICD-10-CM | POA: Diagnosis not present

## 2017-05-13 MED ORDER — HYDROCHLOROTHIAZIDE 25 MG PO TABS: 25.0000 mg | ORAL_TABLET | Freq: Every day | ORAL | 1 refills | Status: DC

## 2017-05-13 MED ORDER — BENAZEPRIL HCL 40 MG PO TABS: 40.0000 mg | ORAL_TABLET | Freq: Every day | ORAL | 1 refills | Status: DC

## 2017-05-13 NOTE — Patient Instructions (Addendum)

## 2017-05-13 NOTE — Assessment & Plan Note (Signed)
Rechecking levels today. Stable. Continue to monitor.

## 2017-05-13 NOTE — Assessment & Plan Note (Signed)
Has stopped her lovastatin. Rechecking levels today. Await results. Call with any concerns.

## 2017-05-13 NOTE — Progress Notes (Signed)
BP 126/62 (BP Location: Left Arm, Cuff Size: Normal)   Pulse 65   Temp 98.5 F (36.9 C)   Wt 149 lb 2 oz (67.6 kg)   LMP 05/13/1980   SpO2 99%   BMI 26.42 kg/m    Subjective:    Patient ID: Cassie Brewer, female    DOB: 1929/02/12, 84 y.o.   MRN: [id number]  HPI: Cassie Brewer is a 84 y.o. female  Chief Complaint  Patient presents with  . Hypertension  . Hyperlipidemia   HYPERTENSION / HYPERLIPIDEMIA Satisfied with current treatment? yes Duration of hypertension: chronic BP monitoring frequency: not checking BP medication side effects: no Past BP meds: benazepril-HCTZ Duration of hyperlipidemia: chronic Cholesterol medication side effects: no Cholesterol supplements: fish oil Past cholesterol medications: lovastatin Medication compliance: excellent compliance Aspirin: no Recent stressors: no Recurrent headaches: no Visual changes: no Palpitations: no Dyspnea: no Chest pain: no Lower extremity edema: no Dizzy/lightheaded: no  HYPOTHYROIDISM Thyroid control status:controlled Satisfied with current treatment? yes Medication side effects: no Medication compliance: excellent compliance Etiology of hypothyroidism:  Recent dose adjustment:no Fatigue: no Cold intolerance: no Heat intolerance: no Weight gain: no Weight loss: no Constipation: no Diarrhea/loose stools: no Palpitations: no Lower extremity edema: no Anxiety/depressed mood: no  Relevant past medical, surgical, family and social history reviewed and updated as indicated. Interim medical history since our last visit reviewed. Allergies and medications reviewed and updated.  Review of Systems  Constitutional: Negative.   Respiratory: Negative.   Cardiovascular: Negative.   Skin:       itching  Psychiatric/Behavioral: Negative.     Per HPI unless specifically indicated above     Objective:    BP 126/62 (BP Location: Left Arm, Cuff Size: Normal)   Pulse 65   Temp 98.5 F (36.9 C)    Wt 149 lb 2 oz (67.6 kg)   LMP 05/13/1980   SpO2 99%   BMI 26.42 kg/m   Wt Readings from Last 3 Encounters:  05/13/17 149 lb 2 oz (67.6 kg)  11/10/16 144 lb 2 oz (65.4 kg)  11/03/16 145 lb 1.6 oz (65.8 kg)    Physical Exam  Constitutional: She is oriented to person, place, and time. She appears well-developed and well-nourished. No distress.  HENT:  Head: Normocephalic and atraumatic.  Right Ear: Hearing normal.  Left Ear: Hearing normal.  Nose: Nose normal.  Eyes: Conjunctivae and lids are normal. Right eye exhibits no discharge. Left eye exhibits no discharge. No scleral icterus.  Cardiovascular: Normal rate, regular rhythm, normal heart sounds and intact distal pulses. Exam reveals no gallop and no friction rub.  No murmur heard. Pulmonary/Chest: Effort normal and breath sounds normal. No respiratory distress. She has no wheezes. She has no rales. She exhibits no tenderness.  Musculoskeletal: Normal range of motion.  Neurological: She is alert and oriented to person, place, and time.  Skin: Skin is warm, dry and intact. No rash noted. She is not diaphoretic. No erythema. No pallor.  Psychiatric: She has a normal mood and affect. Her speech is normal and behavior is normal. Judgment and thought content normal. Cognition and memory are normal.  Nursing note and vitals reviewed.   Results for orders placed or performed in visit on 11/10/16  Microscopic Examination  Result Value Ref Range   WBC, UA None seen 0 - 5 /hpf   RBC, UA 0-2 0 - 2 /hpf   Epithelial Cells (non renal) 0-10 0 - 10 /hpf   Bacteria, UA None  seen None seen/Few  CBC with Differential/Platelet  Result Value Ref Range   WBC 5.5 3.4 - 10.8 x10E3/uL   RBC 4.24 3.77 - 5.28 x10E6/uL   Hemoglobin 12.3 11.1 - 15.9 g/dL   Hematocrit 35.7 34.0 - 46.6 %   MCV 84 79 - 97 fL   MCH 29.0 26.6 - 33.0 pg   MCHC 34.5 31.5 - 35.7 g/dL   RDW 14.4 12.3 - 15.4 %   Platelets 280 150 - 379 x10E3/uL   Neutrophils 60 Not Estab. %    Lymphs 28 Not Estab. %   Monocytes 9 Not Estab. %   Eos 2 Not Estab. %   Basos 1 Not Estab. %   Neutrophils Absolute 3.3 1.4 - 7.0 x10E3/uL   Lymphocytes Absolute 1.5 0.7 - 3.1 x10E3/uL   Monocytes Absolute 0.5 0.1 - 0.9 x10E3/uL   EOS (ABSOLUTE) 0.1 0.0 - 0.4 x10E3/uL   Basophils Absolute 0.0 0.0 - 0.2 x10E3/uL   Immature Granulocytes 0 Not Estab. %   Immature Grans (Abs) 0.0 0.0 - 0.1 x10E3/uL  Comprehensive metabolic panel  Result Value Ref Range   Glucose 91 65 - 99 mg/dL   BUN 20 8 - 27 mg/dL   Creatinine, Ser 0.97 0.57 - 1.00 mg/dL   GFR calc non Af Amer 53 (L) >59 mL/min/1.73   GFR calc Af Amer 61 >59 mL/min/1.73   BUN/Creatinine Ratio 21 12 - 28   Sodium 140 134 - 144 mmol/L   Potassium 4.3 3.5 - 5.2 mmol/L   Chloride 102 96 - 106 mmol/L   CO2 23 20 - 29 mmol/L   Calcium 9.5 8.7 - 10.3 mg/dL   Total Protein 6.9 6.0 - 8.5 g/dL   Albumin 4.2 3.5 - 4.7 g/dL   Globulin, Total 2.7 1.5 - 4.5 g/dL   Albumin/Globulin Ratio 1.6 1.2 - 2.2   Bilirubin Total 0.5 0.0 - 1.2 mg/dL   Alkaline Phosphatase 56 39 - 117 IU/L   AST 16 0 - 40 IU/L   ALT 12 0 - 32 IU/L  Lipid Panel w/o Chol/HDL Ratio  Result Value Ref Range   Cholesterol, Total 196 100 - 199 mg/dL   Triglycerides 74 0 - 149 mg/dL   HDL 68 >39 mg/dL   VLDL Cholesterol Cal 15 5 - 40 mg/dL   LDL Calculated 113 (H) 0 - 99 mg/dL  Microalbumin, Urine Waived  Result Value Ref Range   Microalb, Ur Waived 10 0 - 19 mg/L   Creatinine, Urine Waived 100 10 - 300 mg/dL   Microalb/Creat Ratio <30 <30 mg/g  TSH  Result Value Ref Range   TSH 1.530 0.450 - 4.500 uIU/mL  UA/M w/rflx Culture, Routine  Result Value Ref Range   Specific Gravity, UA 1.015 1.005 - 1.030   pH, UA 6.5 5.0 - 7.5   Color, UA Yellow Yellow   Appearance Ur Clear Clear   Leukocytes, UA Negative Negative   Protein, UA Negative Negative/Trace   Glucose, UA Negative Negative   Ketones, UA Negative Negative   RBC, UA Negative Negative   Bilirubin, UA  Negative Negative   Urobilinogen, Ur 0.2 0.2 - 1.0 mg/dL   Nitrite, UA Negative Negative   Microscopic Examination See below:       Assessment & Plan:   Problem List Items Addressed This Visit      Endocrine   Hypothyroidism    Has been feeling really cold. Labs checked today. Continue to monitor. Call with any concerns.  Relevant Orders   TSH     Genitourinary   Benign hypertensive renal disease - Primary    Under good control. Continue current regimen. Continue to monitor. Call with any concerns.       Relevant Medications   benazepril (LOTENSIN) 40 MG tablet   hydrochlorothiazide (HYDRODIURIL) 25 MG tablet   Other Relevant Orders   Comprehensive metabolic panel   CBC with Differential/Platelet   CKD (chronic kidney disease), stage III (HCC)    Rechecking levels today. Stable. Continue to monitor.         Other   Hyperlipidemia    Has stopped her lovastatin. Rechecking levels today. Await results. Call with any concerns.       Relevant Medications   benazepril (LOTENSIN) 40 MG tablet   hydrochlorothiazide (HYDRODIURIL) 25 MG tablet   Other Relevant Orders   Comprehensive metabolic panel   Lipid Panel w/o Chol/HDL Ratio    Other Visit Diagnoses    Immunization due       Flu shot given today.   Relevant Orders   Flu vaccine HIGH DOSE PF (Fluzone High dose) (Completed)   Essential hypertension       Relevant Medications   benazepril (LOTENSIN) 40 MG tablet   hydrochlorothiazide (HYDRODIURIL) 25 MG tablet       Follow up plan: Return in about 6 months (around 11/10/2017) for Wellness/physical.

## 2017-05-13 NOTE — Assessment & Plan Note (Signed)
Has been feeling really cold. Labs checked today. Continue to monitor. Call with any concerns.

## 2017-05-13 NOTE — Assessment & Plan Note (Signed)
Under good control. Continue current regimen. Continue to monitor. Call with any concerns. 

## 2017-05-14 LAB — COMPREHENSIVE METABOLIC PANEL
ALBUMIN: 4.3 g/dL (ref 3.5–4.7)
ALK PHOS: 58 IU/L (ref 39–117)
ALT: 17 IU/L (ref 0–32)
AST: 18 IU/L (ref 0–40)
Albumin/Globulin Ratio: 1.5 (ref 1.2–2.2)
BUN / CREAT RATIO: 18 (ref 12–28)
BUN: 17 mg/dL (ref 8–27)
Bilirubin Total: 0.4 mg/dL (ref 0.0–1.2)
CALCIUM: 9.6 mg/dL (ref 8.7–10.3)
CO2: 24 mmol/L (ref 20–29)
CREATININE: 0.97 mg/dL (ref 0.57–1.00)
Chloride: 103 mmol/L (ref 96–106)
GFR calc Af Amer: 61 mL/min/{1.73_m2} (ref >59)
GFR, EST NON AFRICAN AMERICAN: 53 mL/min/{1.73_m2} — AB (ref >59)
GLOBULIN, TOTAL: 2.9 g/dL (ref 1.5–4.5)
GLUCOSE: 102 mg/dL — AB (ref 65–99)
Potassium: 4.1 mmol/L (ref 3.5–5.2)
SODIUM: 142 mmol/L (ref 134–144)
Total Protein: 7.2 g/dL (ref 6.0–8.5)

## 2017-05-14 LAB — CBC WITH DIFFERENTIAL/PLATELET
BASOS ABS: 0.1 10*3/uL (ref 0.0–0.2)
BASOS: 1 %
EOS (ABSOLUTE): 0.2 10*3/uL (ref 0.0–0.4)
Eos: 4 %
Hematocrit: 38 % (ref 34.0–46.6)
Hemoglobin: 12.5 g/dL (ref 11.1–15.9)
Immature Grans (Abs): 0 10*3/uL (ref 0.0–0.1)
Immature Granulocytes: 0 %
LYMPHS: 26 %
Lymphocytes Absolute: 1.2 10*3/uL (ref 0.7–3.1)
MCH: 29.8 pg (ref 26.6–33.0)
MCHC: 32.9 g/dL (ref 31.5–35.7)
MCV: 91 fL (ref 79–97)
MONOS ABS: 0.4 10*3/uL (ref 0.1–0.9)
Monocytes: 10 %
NEUTROS ABS: 2.7 10*3/uL (ref 1.4–7.0)
NEUTROS PCT: 59 %
PLATELETS: 299 10*3/uL (ref 150–379)
RBC: 4.19 x10E6/uL (ref 3.77–5.28)
RDW: 14.2 % (ref 12.3–15.4)
WBC: 4.5 10*3/uL (ref 3.4–10.8)

## 2017-05-14 LAB — LIPID PANEL W/O CHOL/HDL RATIO
CHOLESTEROL TOTAL: 251 mg/dL — AB (ref 100–199)
HDL: 80 mg/dL (ref >39)
LDL CALC: 156 mg/dL — AB (ref 0–99)
TRIGLYCERIDES: 76 mg/dL (ref 0–149)
VLDL CHOLESTEROL CAL: 15 mg/dL (ref 5–40)

## 2017-05-14 LAB — TSH: TSH: 3.14 u[IU]/mL (ref 0.450–4.500)

## 2017-05-16 ENCOUNTER — Encounter: Payer: Self-pay | Admitting: Family Medicine

## 2017-07-27 DIAGNOSIS — L538 Other specified erythematous conditions: Secondary | ICD-10-CM | POA: Diagnosis not present

## 2017-07-27 DIAGNOSIS — B078 Other viral warts: Secondary | ICD-10-CM | POA: Diagnosis not present

## 2017-07-27 DIAGNOSIS — R208 Other disturbances of skin sensation: Secondary | ICD-10-CM | POA: Diagnosis not present

## 2017-07-27 DIAGNOSIS — L298 Other pruritus: Secondary | ICD-10-CM | POA: Diagnosis not present

## 2017-08-05 DIAGNOSIS — H401131 Primary open-angle glaucoma, bilateral, mild stage: Secondary | ICD-10-CM | POA: Diagnosis not present

## 2017-08-31 ENCOUNTER — Encounter: Payer: Self-pay | Admitting: Family Medicine

## 2017-11-05 NOTE — Telephone Encounter (Signed)
sched for 11/14/17 °

## 2017-11-10 ENCOUNTER — Encounter: Payer: PPO | Admitting: Family Medicine

## 2017-11-14 ENCOUNTER — Ambulatory Visit (INDEPENDENT_AMBULATORY_CARE_PROVIDER_SITE_OTHER): Payer: PPO

## 2017-11-14 VITALS — BP 122/78 | HR 73 | Temp 98.1°F | Resp 17 | Ht 64.0 in | Wt 143.8 lb

## 2017-11-14 DIAGNOSIS — Z0000 Encounter for general adult medical examination without abnormal findings: Secondary | ICD-10-CM | POA: Diagnosis not present

## 2017-11-14 NOTE — Progress Notes (Signed)
Subjective:   Cassie Brewer is a 84 y.o. female who presents for Medicare Annual (Subsequent) preventive examination.  Review of Systems:   Cardiac Risk Factors include: advanced age (>44men, >32 women);hypertension;dyslipidemia     Objective:     Vitals: BP 122/78 (BP Location: Left Arm, Patient Position: Sitting)   Pulse 73   Temp 98.1 F (36.7 C) (Temporal)   Resp 17   Ht 5\' 4"  (1.626 m)   Wt 143 lb 12.8 oz (65.2 kg)   LMP 05/13/1980   BMI 24.68 kg/m   Body mass index is 24.68 kg/m.  Advanced Directives 11/14/2017 11/03/2016  Does Patient Have a Medical Advance Directive? Yes Yes  Type of Paramedic of [place];Living will [place];Living will  Copy of [place] in Chart? No - copy requested No - copy requested    Tobacco Social History   Tobacco Use  Smoking Status Never Smoker  Smokeless Tobacco Never Used     Counseling given: Not Answered   Clinical Intake:  Pre-visit preparation completed: Yes  Pain : No/denies pain     Nutritional Status: BMI of 19-24  Normal Nutritional Risks: None Diabetes: No  How often do you need to have someone help you when you read instructions, pamphlets, or other written materials from your doctor or pharmacy?: 1 - Never What is the last grade level you completed in school?: some college   Interpreter Needed?: No  Information entered by :: Mirza Kidney,LPN   Past Medical History:  Diagnosis Date  . Glaucoma   . Hyperlipidemia   . Hypertension   . Overactive bladder   . Squamous cell skin cancer, face    Past Surgical History:  Procedure Laterality Date  . ABDOMINAL HYSTERECTOMY    . BREAST SURGERY    . THYROIDECTOMY, PARTIAL Right    Family History  Problem Relation Age of Onset  . Hypertension Mother   . Diabetes Father   . Colon cancer Father   . Breast cancer Grandchild    Social History   Socioeconomic History  . Marital status:  Widowed    Spouse name: Not on file  . Number of children: Not on file  . Years of education: Not on file  . Highest education level: Some college, no degree  Occupational History  . Not on file  Social Needs  . Financial resource strain: Not hard at all  . Food insecurity:    Worry: Never true    Inability: Never true  . Transportation needs:    Medical: No    Non-medical: No  Tobacco Use  . Smoking status: Never Smoker  . Smokeless tobacco: Never Used  Substance and Sexual Activity  . Alcohol use: No  . Drug use: No  . Sexual activity: Not Currently    Birth control/protection: Surgical  Lifestyle  . Physical activity:    Days per week: 0 days    Minutes per session: 0 min  . Stress: Not at all  Relationships  . Social connections:    Talks on phone: More than three times a week    Gets together: More than three times a week    Attends religious service: More than 4 times per year    Active member of club or organization: Yes    Attends meetings of clubs or organizations: More than 4 times per year    Relationship status: Widowed  Other Topics Concern  . Not on file  Social History Narrative  . Not on file    Outpatient Encounter Medications as of 11/14/2017  Medication Sig  . benazepril (LOTENSIN) 40 MG tablet Take 1 tablet (40 mg total) by mouth daily.  . Calcium Carbonate-Vit D-Min (CALCIUM 600+D PLUS MINERALS) 600-400 MG-UNIT TABS   . hydrochlorothiazide (HYDRODIURIL) 25 MG tablet Take 1 tablet (25 mg total) by mouth daily.  Marland Kitchen latanoprost (XALATAN) 0.005 % ophthalmic solution Place 1 drop into both eyes at bedtime.  Marland Kitchen levothyroxine (SYNTHROID, LEVOTHROID) 112 MCG tablet TAKE 1 TABLET (112 MCG TOTAL) BY MOUTH DAILY BEFORE BREAKFAST.  . Multiple Vitamins-Minerals (CENTRUM SILVER ULTRA WOMENS PO) Take by mouth.  . Omega-3 Fatty Acids (FISH OIL) 1000 MG CAPS Take by mouth daily.  . timolol (TIMOPTIC) 0.5 % ophthalmic solution   . triamcinolone (KENALOG) 0.025 %  cream Apply 1 application topically 2 (two) times daily.  . vitamin C (ASCORBIC ACID) 500 MG tablet Take 500 mg by mouth daily.  . Cetirizine HCl (ZYRTEC PO) Take by mouth.  . conjugated estrogens (PREMARIN) vaginal cream Place 1 Applicatorful vaginally daily. 1/2 gram nightly x 14 days, then decrease to twice weekly. (Patient not taking: Reported on 11/14/2017)  . lovastatin (MEVACOR) 20 MG tablet Take 1 tablet (20 mg total) by mouth at bedtime. (Patient not taking: Reported on 05/13/2017)  . triamcinolone ointment (KENALOG) 0.5 % Apply 1 application topically 2 (two) times daily.   No facility-administered encounter medications on file as of 11/14/2017.     Activities of Daily Living In your present state of health, do you have any difficulty performing the following activities: 11/14/2017  Hearing? 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 N  Walking or climbing stairs? N  Dressing or bathing? N  Doing errands, shopping? N  Preparing Food and eating ? N  Using the Toilet? N  In the past six months, have you accidently leaked urine? Y  Comment wears protection   Do you have problems with loss of bowel control? N  Managing your Medications? N  Managing your Finances? N  Housekeeping or managing your Housekeeping? N  Some recent data might be hidden    Patient Care Team: Valerie Roys, DO as PCP - General (Family Medicine) Oneta Rack, MD (Dermatology) Sharlotte Al[place], [place] (Podiatry) Samara Deist, DPM as Referring Physician (Podiatry)    Assessment:   This is a routine wellness examination for Cassie Brewer.  Exercise Activities and Dietary recommendations Current Exercise Habits: The patient does not participate in regular exercise at present, Exercise limited by: None identified  Goals    . Increase water intake     Recommend drinking at least 6-7 glasses of water a day     . Prevent Falls        Fall Risk Fall Risk  11/14/2017 11/03/2016 05/05/2016 10/29/2015  Falls in the past year? No Yes No No  Number falls in past yr: - 2 or more - -  Injury with Fall? - No - -  Follow up - Falls prevention discussed - -   Is the patient's home free of loose throw rugs in walkways, pet beds, electrical cords, etc?   yes      Grab bars in the bathroom? yes      Handrails on the stairs?   yes      Adequate lighting?   yes  Timed Get Up and Go performed: Completed in 8 seconds  with no use of assistive devices, steady gait. No intervention needed at this time.   Depression Screen PHQ 2/9 Scores 11/14/2017 11/03/2016 10/29/2015  PHQ - 2 Score 0 0 0     Cognitive Function     6CIT Screen 11/14/2017 11/03/2016  What Year? 0 points 0 points  What month? 0 points 0 points  What time? 0 points 0 points  Count back from 20 0 points 0 points  Months in reverse 0 points 0 points  Repeat phrase 0 points 0 points  Total Score 0 0    Immunization History  Administered Date(s) Administered  . Influenza, High Dose Seasonal PF 01/30/2016, 05/13/2017  . Influenza,inj,Quad PF,6+ Mos 01/21/2015  . Pneumococcal Conjugate-13 10/01/2013  . Pneumococcal Polysaccharide-23 06/06/2006, 09/28/2006  . Td 09/28/2006, 11/10/2016    Qualifies for Shingles Vaccine? Yes, discussed shingrix vaccine   Screening Tests Health Maintenance  Topic Date Due  . INFLUENZA VACCINE  10/27/2017  . TETANUS/TDAP  11/11/2026  . DEXA SCAN  Completed  . PNA vac Low Risk Adult  Completed    Cancer Screenings: Lung: Low Dose CT Chest recommended if Age 58-80 years, 30 pack-year currently smoking OR have quit w/in 15years. Patient does not qualify. Breast:  Up to date on Mammogram? Yes  No longer required Up to date of Bone Density/Dexa? Yes completed 10/02/2013 Colorectal: no longer required  Additional Screenings: Hepatitis C Screening: not indicated      Plan:    I have personally reviewed and addressed the Medicare  Annual Wellness questionnaire and have noted the following in the patient's chart:  A. Medical and social history B. Use of alcohol, tobacco or illicit drugs  C. Current medications and supplements D. Functional ability and status E.  Nutritional status F.  Physical activity G. Advance directives H. List of other physicians I.  Hospitalizations, surgeries, and ER visits in previous 12 months J.  [place] such as hearing and vision if needed, cognitive and depression L. Referrals and appointments   In addition, I have reviewed and discussed with patient certain preventive protocols, quality metrics, and best practice recommendations. A written personalized care plan for preventive services as well as general preventive health recommendations were provided to patient.   Signed,  Tyler Aas, LPN Nurse Health Advisor   Nurse Notes:  Patient complaining of bottom of feet feeling like leather, states that started about 3 years ago. States they now are itching and are cracked. She uses a cream regularly for this issue but feels she might need something stronger for the itching. Has a podiatrist for her toe nails.   Patient also complains of dry mouth upon waking up in the morning. Discussed hydrating during the day but she is concerned with any other underlying issues it could also come from.

## 2017-11-14 NOTE — Patient Instructions (Signed)
Cassie Brewer , Thank you for taking time to come for your Medicare Wellness Visit. I appreciate your ongoing commitment to your health goals. Please review the following plan we discussed and let me know if I can assist you in the future.   Screening recommendations/referrals: Colonoscopy: not indicated   Mammogram: not indicated  Bone Density: completed  Recommended yearly ophthalmology/optometry visit for glaucoma screening and checkup Recommended yearly dental visit for hygiene and checkup  Vaccinations: Influenza vaccine: due 11/2017 Pneumococcal vaccine: completed series Tdap vaccine: up to date Shingles vaccine: shingrix eligible, check with your insurance company  For coverage     Advanced directives: Please bring a copy of your health care power of attorney and living will to the office at your convenience.  Conditions/risks identified: recommend drinking at least 6-8 glasses of water a day   Next appointment: Follow up on 11/23/2017 at 9:00am with Dr.Johnson. Follow up in one year for your annual wellness exam.   Preventive Care 65 Years and Older, Female Preventive care refers to lifestyle choices and visits with your health care provider that can promote health and wellness. What does preventive care include?  A yearly physical exam. This is also called an annual well check.  Dental exams once or twice a year.  Routine eye exams. Ask your health care provider how often you should have your eyes checked.  Personal lifestyle choices, including:  Daily care of your teeth and gums.  Regular physical activity.  Eating a healthy diet.  Avoiding tobacco and drug use.  Limiting alcohol use.  Practicing safe sex.  Taking low-dose aspirin every day.  Taking vitamin and mineral supplements as recommended by your health care provider. What happens during an annual well check? The services and screenings done by your health care provider during your annual well check will  depend on your age, overall health, lifestyle risk factors, and family history of disease. Counseling  Your health care provider may ask you questions about your:  Alcohol use.  Tobacco use.  Drug use.  Emotional well-being.  Home and relationship well-being.  Sexual activity.  Eating habits.  History of falls.  Memory and ability to understand (cognition).  Work and work Statistician.  Reproductive health. Screening  You may have the following tests or measurements:  Height, weight, and BMI.  Blood pressure.  Lipid and cholesterol levels. These may be checked every 5 years, or more frequently if you are over 27 years old.  Skin check.  Lung cancer screening. You may have this screening every year starting at age 61 if you have a 30-pack-year history of smoking and currently smoke or have quit within the past 15 years.  Fecal occult blood test (FOBT) of the stool. You may have this test every year starting at age 82.  Flexible sigmoidoscopy or colonoscopy. You may have a sigmoidoscopy every 5 years or a colonoscopy every 10 years starting at age 82.  Hepatitis C blood test.  Hepatitis B blood test.  Sexually transmitted disease (STD) testing.  Diabetes screening. This is done by checking your blood sugar (glucose) after you have not eaten for a while (fasting). You may have this done every 1-3 years.  Bone density scan. This is done to screen for osteoporosis. You may have this done starting at age 77.  Mammogram. This may be done every 1-2 years. Talk to your health care provider about how often you should have regular mammograms. Talk with your health care provider about your test  results, treatment options, and if necessary, the need for more tests. Vaccines  Your health care provider may recommend certain vaccines, such as:  Influenza vaccine. This is recommended every year.  Tetanus, diphtheria, and acellular pertussis (Tdap, Td) vaccine. You may need a  Td booster every 10 years.  Zoster vaccine. You may need this after age 60.  Pneumococcal 13-valent conjugate (PCV13) vaccine. One dose is recommended after age 90.  Pneumococcal polysaccharide (PPSV23) vaccine. One dose is recommended after age 56. Talk to your health care provider about which screenings and vaccines you need and how often you need them. This information is not intended to replace advice given to you by your health care provider. Make sure you discuss any questions you have with your health care provider. Document Released: 04/11/2015 Document Revised: 12/03/2015 Document Reviewed: 01/14/2015 Elsevier Interactive Patient Education  2017 [place] Prevention in the Home Falls can cause injuries. They can happen to people of all ages. There are many things you can do to make your home safe and to help prevent falls. What can I do on the outside of my home?  Regularly fix the edges of walkways and driveways and fix any cracks.  Remove anything that might make you trip as you walk through a door, such as a raised step or threshold.  Trim any bushes or trees on the path to your home.  Use bright outdoor lighting.  Clear any walking paths of anything that might make someone trip, such as rocks or tools.  Regularly check to see if handrails are loose or broken. Make sure that both sides of any steps have handrails.  Any raised decks and porches should have guardrails on the edges.  Have any leaves, snow, or ice cleared regularly.  Use sand or salt on walking paths during winter.  Clean up any spills in your garage right away. This includes oil or grease spills. What can I do in the bathroom?  Use night lights.  Install grab bars by the toilet and in the tub and shower. Do not use towel bars as grab bars.  Use non-skid mats or decals in the tub or shower.  If you need to sit down in the shower, use a plastic, non-slip stool.  Keep the floor dry. Clean  up any water that spills on the floor as soon as it happens.  Remove soap buildup in the tub or shower regularly.  Attach bath mats securely with double-sided non-slip rug tape.  Do not have throw rugs and other things on the floor that can make you trip. What can I do in the bedroom?  Use night lights.  Make sure that you have a light by your bed that is easy to reach.  Do not use any sheets or blankets that are too big for your bed. They should not hang down onto the floor.  Have a firm chair that has side arms. You can use this for support while you get dressed.  Do not have throw rugs and other things on the floor that can make you trip. What can I do in the kitchen?  Clean up any spills right away.  Avoid walking on wet floors.  Keep items that you use a lot in easy-to-reach places.  If you need to reach something above you, use a strong step stool that has a grab bar.  Keep electrical cords out of the way.  Do not use floor polish or wax that makes  floors slippery. If you must use wax, use non-skid floor wax.  Do not have throw rugs and other things on the floor that can make you trip. What can I do with my stairs?  Do not leave any items on the stairs.  Make sure that there are handrails on both sides of the stairs and use them. Fix handrails that are broken or loose. Make sure that handrails are as long as the stairways.  Check any carpeting to make sure that it is firmly attached to the stairs. Fix any carpet that is loose or worn.  Avoid having throw rugs at the top or bottom of the stairs. If you do have throw rugs, attach them to the floor with carpet tape.  Make sure that you have a light switch at the top of the stairs and the bottom of the stairs. If you do not have them, ask someone to add them for you. What else can I do to help prevent falls?  Wear shoes that:  Do not have high heels.  Have rubber bottoms.  Are comfortable and fit you well.  Are  closed at the toe. Do not wear sandals.  If you use a stepladder:  Make sure that it is fully opened. Do not climb a closed stepladder.  Make sure that both sides of the stepladder are locked into place.  Ask someone to hold it for you, if possible.  Clearly mark and make sure that you can see:  Any grab bars or handrails.  First and last steps.  Where the edge of each step is.  Use tools that help you move around (mobility aids) if they are needed. These include:  Canes.  Walkers.  Scooters.  Crutches.  Turn on the lights when you go into a dark area. Replace any light bulbs as soon as they burn out.  Set up your furniture so you have a clear path. Avoid moving your furniture around.  If any of your floors are uneven, fix them.  If there are any pets around you, be aware of where they are.  Review your medicines with your doctor. Some medicines can make you feel dizzy. This can increase your chance of falling. Ask your doctor what other things that you can do to help prevent falls. This information is not intended to replace advice given to you by your health care provider. Make sure you discuss any questions you have with your health care provider. Document Released: 01/09/2009 Document Revised: 08/21/2015 Document Reviewed: 04/19/2014 Elsevier Interactive Patient Education  2017 [employer].

## 2017-11-23 ENCOUNTER — Ambulatory Visit (INDEPENDENT_AMBULATORY_CARE_PROVIDER_SITE_OTHER): Payer: PPO | Admitting: Family Medicine

## 2017-11-23 ENCOUNTER — Encounter: Payer: Self-pay | Admitting: Family Medicine

## 2017-11-23 VITALS — BP 132/76 | HR 70 | Temp 98.3°F | Wt 142.6 lb

## 2017-11-23 DIAGNOSIS — R21 Rash and other nonspecific skin eruption: Secondary | ICD-10-CM | POA: Diagnosis not present

## 2017-11-23 DIAGNOSIS — N183 Chronic kidney disease, stage 3 unspecified: Secondary | ICD-10-CM

## 2017-11-23 DIAGNOSIS — I10 Essential (primary) hypertension: Secondary | ICD-10-CM

## 2017-11-23 DIAGNOSIS — E038 Other specified hypothyroidism: Secondary | ICD-10-CM

## 2017-11-23 DIAGNOSIS — Z1231 Encounter for screening mammogram for malignant neoplasm of breast: Secondary | ICD-10-CM

## 2017-11-23 DIAGNOSIS — J392 Other diseases of pharynx: Secondary | ICD-10-CM

## 2017-11-23 DIAGNOSIS — Z0000 Encounter for general adult medical examination without abnormal findings: Secondary | ICD-10-CM | POA: Diagnosis not present

## 2017-11-23 DIAGNOSIS — E785 Hyperlipidemia, unspecified: Secondary | ICD-10-CM | POA: Diagnosis not present

## 2017-11-23 DIAGNOSIS — E039 Hypothyroidism, unspecified: Secondary | ICD-10-CM | POA: Diagnosis not present

## 2017-11-23 DIAGNOSIS — Z1239 Encounter for other screening for malignant neoplasm of breast: Secondary | ICD-10-CM

## 2017-11-23 DIAGNOSIS — I129 Hypertensive chronic kidney disease with stage 1 through stage 4 chronic kidney disease, or unspecified chronic kidney disease: Secondary | ICD-10-CM | POA: Diagnosis not present

## 2017-11-23 DIAGNOSIS — E063 Autoimmune thyroiditis: Secondary | ICD-10-CM | POA: Diagnosis not present

## 2017-11-23 LAB — UA/M W/RFLX CULTURE, ROUTINE
BILIRUBIN UA: NEGATIVE
Glucose, UA: NEGATIVE
Ketones, UA: NEGATIVE
Leukocytes, UA: NEGATIVE
NITRITE UA: NEGATIVE
PH UA: 7 (ref 5.0–7.5)
PROTEIN UA: NEGATIVE
RBC UA: NEGATIVE
SPEC GRAV UA: 1.015 (ref 1.005–1.030)
Urobilinogen, Ur: 0.2 mg/dL (ref 0.2–1.0)

## 2017-11-23 LAB — MICROALBUMIN, URINE WAIVED
Creatinine, Urine Waived: 50 mg/dL (ref 10–300)
Microalb, Ur Waived: 10 mg/L (ref 0–19)

## 2017-11-23 MED ORDER — CLOTRIMAZOLE-BETAMETHASONE 1-0.05 % EX CREA: 1.0000 "application " | TOPICAL_CREAM | Freq: Two times a day (BID) | CUTANEOUS | 3 refills | Status: DC

## 2017-11-23 MED ORDER — BENAZEPRIL HCL 40 MG PO TABS: 40.0000 mg | ORAL_TABLET | Freq: Every day | ORAL | 1 refills | Status: DC

## 2017-11-23 MED ORDER — HYDROCHLOROTHIAZIDE 25 MG PO TABS: 25.0000 mg | ORAL_TABLET | Freq: Every day | ORAL | 1 refills | Status: DC

## 2017-11-23 NOTE — Assessment & Plan Note (Signed)
Off medicine. Rechecking levels today. Continue to monitor. Call with any concerns.

## 2017-11-23 NOTE — Progress Notes (Signed)
BP 132/76 (BP Location: Left Arm, Patient Position: Sitting, Cuff Size: Normal)   Pulse 70   Temp 98.3 F (36.8 C)   Wt 142 lb 9 oz (64.7 kg)   LMP 05/13/1980   SpO2 97%   BMI 24.47 kg/m    Subjective:    Patient ID: Cassie Brewer, female    DOB: 03/30/1928, 84 y.o.   MRN: [id number]  HPI: ACELYN Brewer is a 84 y.o. female presenting on 11/23/2017 for comprehensive medical examination. Current medical complaints include:  RASH Duration:  weeks  Location: bottoms of her feet and legs  Itching: yes Burning: yes Redness: yes Oozing: no Scaling: yes Blisters: no Painful: no Fevers: no Change in detergents/soaps/personal care products: no Recent illness: no Recent travel:no History of same: yes Context: stable Alleviating factors: nothing Treatments attempted:lotion/moisturizer Shortness of breath: no  Throat/tongue swelling: no Myalgias/arthralgias: no   HYPERTENSION / HYPERLIPIDEMIA Satisfied with current treatment? yes Duration of hypertension: chronic BP monitoring frequency: not checking BP medication side effects: no Past BP meds: benazepril, hctz Duration of hyperlipidemia: chronic Cholesterol medication side effects: no Cholesterol supplements: fish oil Past cholesterol medications: lovastatin- stopped it Medication compliance: excellent compliance Aspirin: no Recent stressors: no Recurrent headaches: no Visual changes: no Palpitations: no Dyspnea: no Chest pain: no Lower extremity edema: no Dizzy/lightheaded: no  HYPOTHYROIDISM Thyroid control status:controlled Satisfied with current treatment? yes Medication side effects: no Medication compliance: excellent compliance Recent dose adjustment:no Fatigue: no Cold intolerance: no Heat intolerance: no Weight gain: no Weight loss: no Constipation: no Diarrhea/loose stools: no Palpitations: no Lower extremity edema: no Anxiety/depressed mood: no  Menopausal Symptoms: no  Depression  Screen done today and results listed below:  Depression screen [facility] 2/9 11/14/2017 11/03/2016 10/29/2015  Decreased Interest 0 0 0  Down, Depressed, Hopeless 0 0 0  PHQ - 2 Score 0 0 0    Past Medical History:  Past Medical History:  Diagnosis Date  . Glaucoma   . Hyperlipidemia   . Hypertension   . Overactive bladder   . Squamous cell skin cancer, face     Surgical History:  Past Surgical History:  Procedure Laterality Date  . ABDOMINAL HYSTERECTOMY    . BREAST SURGERY    . THYROIDECTOMY, PARTIAL Right     Medications:  Current Outpatient Medications on File Prior to Visit  Medication Sig  . Calcium Carbonate-Vit D-Min (CALCIUM 600+D PLUS MINERALS) 600-400 MG-UNIT TABS   . Cetirizine HCl (ZYRTEC PO) Take by mouth.  . latanoprost (XALATAN) 0.005 % ophthalmic solution Place 1 drop into both eyes at bedtime.  Marland Kitchen levothyroxine (SYNTHROID, LEVOTHROID) 112 MCG tablet TAKE 1 TABLET (112 MCG TOTAL) BY MOUTH DAILY BEFORE BREAKFAST.  . Multiple Vitamins-Minerals (CENTRUM SILVER ULTRA WOMENS PO) Take by mouth.  . Omega-3 Fatty Acids (FISH OIL) 1000 MG CAPS Take by mouth daily.  . timolol (TIMOPTIC) 0.5 % ophthalmic solution   . triamcinolone (KENALOG) 0.025 % cream Apply 1 application topically 2 (two) times daily.  Marland Kitchen triamcinolone ointment (KENALOG) 0.5 % Apply 1 application topically 2 (two) times daily.  . vitamin C (ASCORBIC ACID) 500 MG tablet Take 500 mg by mouth daily.   No current facility-administered medications on file prior to visit.     Allergies:  Allergies  Allergen Reactions  . Amoxicillin     Other reaction(s): Other (See Comments) "blood pressure very high - almost passed out"  . Eryc [Erythromycin] Other (See Comments)  . Sulfa Antibiotics Nausea And Vomiting  Nausea with bactrim in 2017     Social History:  Social History   Socioeconomic History  . Marital status: Widowed    Spouse name: Not on file  . Number of children: Not on file  . Years of  education: Not on file  . Highest education level: Some college, no degree  Occupational History  . Not on file  Social Needs  . Financial resource strain: Not hard at all  . Food insecurity:    Worry: Never true    Inability: Never true  . Transportation needs:    Medical: No    Non-medical: No  Tobacco Use  . Smoking status: Never Smoker  . Smokeless tobacco: Never Used  Substance and Sexual Activity  . Alcohol use: No  . Drug use: No  . Sexual activity: Not Currently    Birth control/protection: Surgical  Lifestyle  . Physical activity:    Days per week: 0 days    Minutes per session: 0 min  . Stress: Not at all  Relationships  . Social connections:    Talks on phone: More than three times a week    Gets together: More than three times a week    Attends religious service: More than 4 times per year    Active member of club or organization: Yes    Attends meetings of clubs or organizations: More than 4 times per year    Relationship status: Widowed  . Intimate partner violence:    Fear of current or ex partner: No    Emotionally abused: No    Physically abused: No    Forced sexual activity: No  Other Topics Concern  . Not on file  Social History Narrative  . Not on file   Social History   Tobacco Use  Smoking Status Never Smoker  Smokeless Tobacco Never Used   Social History   Substance and Sexual Activity  Alcohol Use No    Family History:  Family History  Problem Relation Age of Onset  . Hypertension Mother   . Diabetes Father   . Colon cancer Father   . Breast cancer Grandchild     Past medical history, surgical history, medications, allergies, family history and social history reviewed with patient today and changes made to appropriate areas of the chart.   Review of Systems  Constitutional: Negative.   HENT: Positive for sore throat. Negative for congestion, ear discharge, ear pain, hearing loss, nosebleeds, sinus pain and tinnitus.         Dry mouth  Eyes: Positive for blurred vision. Negative for double vision, photophobia, pain, discharge and redness.  Respiratory: Positive for cough. Negative for hemoptysis, sputum production, shortness of breath, wheezing and stridor.   Cardiovascular: Positive for leg swelling. Negative for chest pain, palpitations, orthopnea, claudication and PND.  Gastrointestinal: Positive for heartburn (resolves with tums). Negative for abdominal pain, blood in stool, constipation, diarrhea, melena, nausea and vomiting.  Genitourinary: Negative.   Musculoskeletal: Positive for back pain. Negative for falls, joint pain, myalgias and neck pain.  Skin: Positive for itching and rash.  Neurological: Negative.   Endo/Heme/Allergies: Negative.   Psychiatric/Behavioral: Negative.     All other ROS negative except what is listed above and in the HPI.      Objective:    BP 132/76 (BP Location: Left Arm, Patient Position: Sitting, Cuff Size: Normal)   Pulse 70   Temp 98.3 F (36.8 C)   Wt 142 lb 9 oz (64.7 kg)  LMP 05/13/1980   SpO2 97%   BMI 24.47 kg/m   Wt Readings from Last 3 Encounters:  11/23/17 142 lb 9 oz (64.7 kg)  11/14/17 143 lb 12.8 oz (65.2 kg)  05/13/17 149 lb 2 oz (67.6 kg)    Physical Exam  Constitutional: She is oriented to person, place, and time. She appears well-developed and well-nourished. No distress.  HENT:  Head: Normocephalic and atraumatic.  Right Ear: Hearing, tympanic membrane, external ear and ear canal normal.  Left Ear: Hearing, tympanic membrane, external ear and ear canal normal.  Nose: Nose normal.  Mouth/Throat: Uvula is midline, oropharynx is clear and moist and mucous membranes are normal. No oropharyngeal exudate.  Small 0.5cm nodule on L posterior pharynx   Eyes: Pupils are equal, round, and reactive to light. Conjunctivae, EOM and lids are normal. Right eye exhibits no discharge. Left eye exhibits no discharge. No scleral icterus.  Neck: Normal range of  motion. Neck supple. No JVD present. No tracheal deviation present. No thyromegaly present.  Cardiovascular: Normal rate, regular rhythm, normal heart sounds and intact distal pulses. Exam reveals no gallop and no friction rub.  No murmur heard. Pulmonary/Chest: Effort normal and breath sounds normal. No stridor. No respiratory distress. She has no wheezes. She has no rales. She exhibits no tenderness.  Abdominal: Soft. Bowel sounds are normal. She exhibits no distension and no mass. There is no tenderness. There is no rebound and no guarding. No hernia.  Genitourinary:  Genitourinary Comments: Breast and pelvic exams deferred with shared decision making  Musculoskeletal: Normal range of motion. She exhibits no edema, tenderness or deformity.  Lymphadenopathy:    She has no cervical adenopathy.  Neurological: She is alert and oriented to person, place, and time. She displays normal reflexes. No cranial nerve deficit or sensory deficit. She exhibits normal muscle tone. Coordination normal.  Skin: Skin is warm, dry and intact. Capillary refill takes less than 2 seconds. No rash noted. She is not diaphoretic. No erythema. No pallor.  Psychiatric: She has a normal mood and affect. Her speech is normal and behavior is normal. Judgment and thought content normal. Cognition and memory are normal.  Nursing note and vitals reviewed.   Results for orders placed or performed in visit on 05/13/17  Comprehensive metabolic panel  Result Value Ref Range   Glucose 102 (H) 65 - 99 mg/dL   BUN 17 8 - 27 mg/dL   Creatinine, Ser 0.97 0.57 - 1.00 mg/dL   GFR calc non Af Amer 53 (L) >59 mL/min/1.73   GFR calc Af Amer 61 >59 mL/min/1.73   BUN/Creatinine Ratio 18 12 - 28   Sodium 142 134 - 144 mmol/L   Potassium 4.1 3.5 - 5.2 mmol/L   Chloride 103 96 - 106 mmol/L   CO2 24 20 - 29 mmol/L   Calcium 9.6 8.7 - 10.3 mg/dL   Total Protein 7.2 6.0 - 8.5 g/dL   Albumin 4.3 3.5 - 4.7 g/dL   Globulin, Total 2.9 1.5 -  4.5 g/dL   Albumin/Globulin Ratio 1.5 1.2 - 2.2   Bilirubin Total 0.4 0.0 - 1.2 mg/dL   Alkaline Phosphatase 58 39 - 117 IU/L   AST 18 0 - 40 IU/L   ALT 17 0 - 32 IU/L  Lipid Panel w/o Chol/HDL Ratio  Result Value Ref Range   Cholesterol, Total 251 (H) 100 - 199 mg/dL   Triglycerides 76 0 - 149 mg/dL   HDL 80 >39 mg/dL   VLDL Cholesterol  Cal 15 5 - 40 mg/dL   LDL Calculated 156 (H) 0 - 99 mg/dL  TSH  Result Value Ref Range   TSH 3.140 0.450 - 4.500 uIU/mL  CBC with Differential/Platelet  Result Value Ref Range   WBC 4.5 3.4 - 10.8 x10E3/uL   RBC 4.19 3.77 - 5.28 x10E6/uL   Hemoglobin 12.5 11.1 - 15.9 g/dL   Hematocrit 38.0 34.0 - 46.6 %   MCV 91 79 - 97 fL   MCH 29.8 26.6 - 33.0 pg   MCHC 32.9 31.5 - 35.7 g/dL   RDW 14.2 12.3 - 15.4 %   Platelets 299 150 - 379 x10E3/uL   Neutrophils 59 Not Estab. %   Lymphs 26 Not Estab. %   Monocytes 10 Not Estab. %   Eos 4 Not Estab. %   Basos 1 Not Estab. %   Neutrophils Absolute 2.7 1.4 - 7.0 x10E3/uL   Lymphocytes Absolute 1.2 0.7 - 3.1 x10E3/uL   Monocytes Absolute 0.4 0.1 - 0.9 x10E3/uL   EOS (ABSOLUTE) 0.2 0.0 - 0.4 x10E3/uL   Basophils Absolute 0.1 0.0 - 0.2 x10E3/uL   Immature Granulocytes 0 Not Estab. %   Immature Grans (Abs) 0.0 0.0 - 0.1 x10E3/uL      Assessment & Plan:   Problem List Items Addressed This Visit      Endocrine   Hypothyroidism    Rechecking levels today. Await results. Will adjust dose as needed. Call with any concerns.       Relevant Orders   CBC with Differential/Platelet   Comprehensive metabolic panel   TSH   UA/M w/rflx Culture, Routine     Genitourinary   Benign hypertensive renal disease    Under good control on current regimen. Continue current regimen. Continue to monitor. Call with any concerns. Refills given.        Relevant Medications   benazepril (LOTENSIN) 40 MG tablet   hydrochlorothiazide (HYDRODIURIL) 25 MG tablet   Other Relevant Orders   CBC with Differential/Platelet     Comprehensive metabolic panel   Microalbumin, Urine Waived   UA/M w/rflx Culture, Routine   CKD (chronic kidney disease), stage III (HCC)    Rechecking levels today. Await results. Call with any concerns.       Relevant Orders   CBC with Differential/Platelet   Comprehensive metabolic panel   UA/M w/rflx Culture, Routine     Other   Hyperlipidemia    Off medicine. Rechecking levels today. Continue to monitor. Call with any concerns.       Relevant Medications   benazepril (LOTENSIN) 40 MG tablet   hydrochlorothiazide (HYDRODIURIL) 25 MG tablet   Other Relevant Orders   CBC with Differential/Platelet   Comprehensive metabolic panel   Lipid Panel w/o Chol/HDL Ratio   UA/M w/rflx Culture, Routine    Other Visit Diagnoses    Routine general medical examination at a health care facility    -  Primary   Vaccines up to date. Screening labs checked today. Pap, Colonoscopy, Mammogram N/A. Continue diet and exercise. Call with any concerns.    Essential hypertension       Relevant Medications   benazepril (LOTENSIN) 40 MG tablet   hydrochlorothiazide (HYDRODIURIL) 25 MG tablet   Screening for breast cancer       Would like to get mammogram. Ordered today.   Relevant Orders   MM DIGITAL SCREENING BILATERAL   Pharyngeal mass       Small nodule on L pharynx. Continues with sore  throats. Will refer to ENT. Referral generated today.   Relevant Orders   Ambulatory referral to ENT   Rash       Will treat with lotrisone. If not getting better with that, consider stopping HCTZ. Follow up with dermatology as scheduled.        Follow up plan: Return in about 6 months (around 05/26/2018).   LABORATORY TESTING:  - Pap smear: not applicable  IMMUNIZATIONS:   - Tdap: Tetanus vaccination status reviewed: last tetanus booster within 10 years. - Influenza: Postponed to flu season - Pneumovax: Up to date - Prevnar: Up to date  SCREENING: -Mammogram: Ordered today  - Colonoscopy: Not  applicable  - Bone Density: Not applicable   PATIENT COUNSELING:   Advised to take 1 mg of folate supplement per day if capable of pregnancy.   Sexuality: Discussed sexually transmitted diseases, partner selection, use of condoms, avoidance of unintended pregnancy  and contraceptive alternatives.   Advised to avoid cigarette smoking.  I discussed with the patient that most people either abstain from alcohol or drink within safe limits (<=14/week and <=4 drinks/occasion for males, <=7/weeks and <= 3 drinks/occasion for females) and that the risk for alcohol disorders and other health effects rises proportionally with the number of drinks per week and how often a drinker exceeds daily limits.  Discussed cessation/primary prevention of drug use and availability of treatment for abuse.   Diet: Encouraged to adjust caloric intake to maintain  or achieve ideal body weight, to reduce intake of dietary saturated fat and total fat, to limit sodium intake by avoiding high sodium foods and not adding table salt, and to maintain adequate dietary potassium and calcium preferably from fresh fruits, vegetables, and low-fat dairy products.    stressed the importance of regular exercise  Injury prevention: Discussed safety belts, safety helmets, smoke detector, smoking near bedding or upholstery.   Dental health: Discussed importance of regular tooth brushing, flossing, and dental visits.    NEXT PREVENTATIVE PHYSICAL DUE IN 1 YEAR. Return in about 6 months (around 05/26/2018).

## 2017-11-23 NOTE — Assessment & Plan Note (Signed)
Under good control on current regimen. Continue current regimen. Continue to monitor. Call with any concerns. Refills given.   

## 2017-11-23 NOTE — Assessment & Plan Note (Signed)
Rechecking levels today. Await results. Call with any concerns.  

## 2017-11-23 NOTE — Patient Instructions (Addendum)
Call In October about the High Dose Flu shot  [facility]  Address: [address], [place], [place] [address]  Phone: [phone]    Health Maintenance for Postmenopausal Women Menopause is a normal process in which your reproductive ability comes to an end. This process happens gradually over a span of months to years, usually between the ages of 31 and 68. Menopause is complete when you have missed 12 consecutive menstrual periods. It is important to talk with your health care provider about some of the most common conditions that affect postmenopausal women, such as heart disease, cancer, and bone loss (osteoporosis). Adopting a healthy lifestyle and getting preventive care can help to promote your health and wellness. Those actions can also lower your chances of developing some of these common conditions. What should I know about menopause? During menopause, you may experience a number of symptoms, such as:  Moderate-to-severe hot flashes.  Night sweats.  Decrease in sex drive.  Mood swings.  Headaches.  Tiredness.  Irritability.  Memory problems.  Insomnia.  Choosing to treat or not to treat menopausal changes is an individual decision that you make with your health care provider. What should I know about hormone replacement therapy and supplements? Hormone therapy products are effective for treating symptoms that are associated with menopause, such as hot flashes and night sweats. Hormone replacement carries certain risks, especially as you become older. If you are thinking about using estrogen or estrogen with progestin treatments, discuss the benefits and risks with your health care provider. What should I know about heart disease and stroke? Heart disease, heart attack, and stroke become more likely as you age. This may be due, in part, to the hormonal changes that your body experiences during menopause. These can affect how your  body processes dietary fats, triglycerides, and cholesterol. Heart attack and stroke are both medical emergencies. There are many things that you can do to help prevent heart disease and stroke:  Have your blood pressure checked at least every 1-2 years. High blood pressure causes heart disease and increases the risk of stroke.  If you are 11-18 years old, ask your health care provider if you should take aspirin to prevent a heart attack or a stroke.  Do not use any tobacco products, including cigarettes, chewing tobacco, or electronic cigarettes. If you need help quitting, ask your health care provider.  It is important to eat a healthy diet and maintain a healthy weight. ? Be sure to include plenty of vegetables, fruits, low-fat dairy products, and lean protein. ? Avoid eating foods that are high in solid fats, added sugars, or salt (sodium).  Get regular exercise. This is one of the most important things that you can do for your health. ? Try to exercise for at least 150 minutes each week. The type of exercise that you do should increase your heart rate and make you sweat. This is known as moderate-intensity exercise. ? Try to do strengthening exercises at least twice each week. Do these in addition to the moderate-intensity exercise.  Know your numbers.Ask your health care provider to check your cholesterol and your blood glucose. Continue to have your blood tested as directed by your health care provider.  What should I know about cancer screening? There are several types of cancer. Take the following steps to reduce your risk and to catch any cancer development as early as possible. Breast Cancer  Practice breast self-awareness. ? This means understanding  your breasts normally appear and feel. ? It also means doing regular breast self-exams. Let your health care provider know about any changes, no matter how small.  If you are 40 or older, have a clinician do a breast exam  (clinical breast exam or CBE) every year. Depending on your age, family history, and medical history, it may be recommended that you also have a yearly breast X-ray (mammogram).  If you have a family history of breast cancer, talk with your health care provider about genetic screening.  If you are at high risk for breast cancer, talk with your health care provider about having an MRI and a mammogram every year.  Breast cancer (BRCA) gene test is recommended for women who have family members with BRCA-related cancers. Results of the assessment will determine the need for genetic counseling and BRCA1 and for BRCA2 testing. BRCA-related cancers include these types: ? Breast. This occurs in males or females. ? Ovarian. ? Tubal. This may also be called fallopian tube cancer. ? Cancer of the abdominal or pelvic lining (peritoneal cancer). ? Prostate. ? Pancreatic.  Cervical, Uterine, and Ovarian Cancer Your health care provider may recommend that you be screened regularly for cancer of the pelvic organs. These include your ovaries, uterus, and vagina. This screening involves a pelvic exam, which includes checking for microscopic changes to the surface of your cervix (Pap test).  For women ages 21-65, health care providers may recommend a pelvic exam and a Pap test every three years. For women ages 30-65, they may recommend the Pap test and pelvic exam, combined with testing for human papilloma virus (HPV), every five years. Some types of HPV increase your risk of cervical cancer. Testing for HPV may also be done on women of any age who have unclear Pap test results.  Other health care providers may not recommend any screening for nonpregnant women who are considered low risk for pelvic cancer and have no symptoms. Ask your health care provider if a screening pelvic exam is right for you.  If you have had past treatment for cervical cancer or a condition that could lead to cancer, you need Pap tests  and screening for cancer for at least 20 years after your treatment. If Pap tests have been discontinued for you, your risk factors (such as having a new sexual partner) need to be reassessed to determine if you should start having screenings again. Some women have medical problems that increase the chance of getting cervical cancer. In these cases, your health care provider may recommend that you have screening and Pap tests more often.  If you have a family history of uterine cancer or ovarian cancer, talk with your health care provider about genetic screening.  If you have vaginal bleeding after reaching menopause, tell your health care provider.  There are currently no reliable tests available to screen for ovarian cancer.  Lung Cancer Lung cancer screening is recommended for adults 55-80 years old who are at high risk for lung cancer because of a history of smoking. A yearly low-dose CT scan of the lungs is recommended if you:  Currently smoke.  Have a history of at least 30 pack-years of smoking and you currently smoke or have quit within the past 15 years. A pack-year is smoking an average of one pack of cigarettes per day for one year.  Yearly screening should:  Continue until it has been 15 years since you quit.  Stop if you develop a health problem that   that would prevent you from having lung cancer treatment.  Colorectal Cancer  This type of cancer can be detected and can often be prevented.  Routine colorectal cancer screening usually begins at age 61 and continues through age 27.  If you have risk factors for colon cancer, your health care provider may recommend that you be screened at an earlier age.  If you have a family history of colorectal cancer, talk with your health care provider about genetic screening.  Your health care provider may also recommend using home test kits to check for hidden blood in your stool.  A small camera at the end of a tube can be used to  examine your colon directly (sigmoidoscopy or colonoscopy). This is done to check for the earliest forms of colorectal cancer.  Direct examination of the colon should be repeated every 5-10 years until age 53. However, if early forms of precancerous polyps or small growths are found or if you have a family history or genetic risk for colorectal cancer, you may need to be screened more often.  Skin Cancer  Check your skin from head to toe regularly.  Monitor any moles. Be sure to tell your health care provider: ? About any new moles or changes in moles, especially if there is a change in a mole's shape or color. ? If you have a mole that is larger than the size of a pencil eraser.  If any of your family members has a history of skin cancer, especially at a young age, talk with your health care provider about genetic screening.  Always use sunscreen. Apply sunscreen liberally and repeatedly throughout the day.  Whenever you are outside, protect yourself by wearing long sleeves, pants, a wide-brimmed hat, and sunglasses.  What should I know about osteoporosis? Osteoporosis is a condition in which bone destruction happens more quickly than new bone creation. After menopause, you may be at an increased risk for osteoporosis. To help prevent osteoporosis or the bone fractures that can happen because of osteoporosis, the following is recommended:  If you are 46-50 years old, get at least 1,000 mg of calcium and at least 600 mg of vitamin D per day.  If you are older than age 68 but younger than age 64, get at least 1,200 mg of calcium and at least 600 mg of vitamin D per day.  If you are older than age 25, get at least 1,200 mg of calcium and at least 800 mg of vitamin D per day.  Smoking and excessive alcohol intake increase the risk of osteoporosis. Eat foods that are rich in calcium and vitamin D, and do weight-bearing exercises several times each week as directed by your health care  provider. What should I know about how menopause affects my mental health? Depression may occur at any age, but it is more common as you become older. Common symptoms of depression include:  Low or sad mood.  Changes in sleep patterns.  Changes in appetite or eating patterns.  Feeling an overall lack of motivation or enjoyment of activities that you previously enjoyed.  Frequent crying spells.  Talk with your health care provider if you think that you are experiencing depression. What should I know about immunizations? It is important that you get and maintain your immunizations. These include:  Tetanus, diphtheria, and pertussis (Tdap) booster vaccine.  Influenza every year before the flu season begins.  Pneumonia vaccine.  Shingles vaccine.  Your health care provider may also recommend other  immunizations. This information is not intended to replace advice given to you by your health care provider. Make sure you discuss any questions you have with your health care provider. Document Released: 05/07/2005 Document Revised: 10/03/2015 Document Reviewed: 12/17/2014 Elsevier Interactive Patient Education  2018 [employer].

## 2017-11-23 NOTE — Assessment & Plan Note (Signed)
Rechecking levels today. Await results. Will adjust dose as needed. Call with any concerns.

## 2017-11-24 LAB — COMPREHENSIVE METABOLIC PANEL
A/G RATIO: 1.6 (ref 1.2–2.2)
ALK PHOS: 56 IU/L (ref 39–117)
ALT: 12 IU/L (ref 0–32)
AST: 16 IU/L (ref 0–40)
Albumin: 4.4 g/dL (ref 3.5–4.7)
BILIRUBIN TOTAL: 0.6 mg/dL (ref 0.0–1.2)
BUN/Creatinine Ratio: 13 (ref 12–28)
BUN: 13 mg/dL (ref 8–27)
CHLORIDE: 101 mmol/L (ref 96–106)
CO2: 24 mmol/L (ref 20–29)
Calcium: 9.9 mg/dL (ref 8.7–10.3)
Creatinine, Ser: 1 mg/dL (ref 0.57–1.00)
GFR calc non Af Amer: 51 mL/min/{1.73_m2} — ABNORMAL LOW (ref >59)
GFR, EST AFRICAN AMERICAN: 59 mL/min/{1.73_m2} — AB (ref >59)
GLUCOSE: 105 mg/dL — AB (ref 65–99)
Globulin, Total: 2.7 g/dL (ref 1.5–4.5)
POTASSIUM: 4.1 mmol/L (ref 3.5–5.2)
Sodium: 141 mmol/L (ref 134–144)
TOTAL PROTEIN: 7.1 g/dL (ref 6.0–8.5)

## 2017-11-24 LAB — CBC WITH DIFFERENTIAL/PLATELET
BASOS ABS: 0.1 10*3/uL (ref 0.0–0.2)
BASOS: 1 %
EOS (ABSOLUTE): 0.2 10*3/uL (ref 0.0–0.4)
Eos: 3 %
Hematocrit: 37.4 % (ref 34.0–46.6)
Hemoglobin: 12.4 g/dL (ref 11.1–15.9)
IMMATURE GRANS (ABS): 0 10*3/uL (ref 0.0–0.1)
Immature Granulocytes: 0 %
LYMPHS ABS: 1.2 10*3/uL (ref 0.7–3.1)
LYMPHS: 25 %
MCH: 29 pg (ref 26.6–33.0)
MCHC: 33.2 g/dL (ref 31.5–35.7)
MCV: 88 fL (ref 79–97)
Monocytes Absolute: 0.5 10*3/uL (ref 0.1–0.9)
Monocytes: 11 %
NEUTROS ABS: 2.8 10*3/uL (ref 1.4–7.0)
Neutrophils: 60 %
PLATELETS: 311 10*3/uL (ref 150–450)
RBC: 4.27 x10E6/uL (ref 3.77–5.28)
RDW: 14.6 % (ref 12.3–15.4)
WBC: 4.7 10*3/uL (ref 3.4–10.8)

## 2017-11-24 LAB — TSH: TSH: 0.703 u[IU]/mL (ref 0.450–4.500)

## 2017-11-24 LAB — LIPID PANEL W/O CHOL/HDL RATIO
Cholesterol, Total: 248 mg/dL — ABNORMAL HIGH (ref 100–199)
HDL: 73 mg/dL (ref >39)
LDL Calculated: 159 mg/dL — ABNORMAL HIGH (ref 0–99)
Triglycerides: 79 mg/dL (ref 0–149)
VLDL Cholesterol Cal: 16 mg/dL (ref 5–40)

## 2017-11-25 ENCOUNTER — Encounter: Payer: Self-pay | Admitting: Family Medicine

## 2017-12-02 DIAGNOSIS — K219 Gastro-esophageal reflux disease without esophagitis: Secondary | ICD-10-CM | POA: Diagnosis not present

## 2017-12-02 DIAGNOSIS — J301 Allergic rhinitis due to pollen: Secondary | ICD-10-CM | POA: Diagnosis not present

## 2017-12-02 DIAGNOSIS — D3705 Neoplasm of uncertain behavior of pharynx: Secondary | ICD-10-CM | POA: Diagnosis not present

## 2017-12-14 ENCOUNTER — Ambulatory Visit: Payer: Self-pay | Admitting: Family Medicine

## 2017-12-14 NOTE — Telephone Encounter (Signed)
"  Pt was given clotrimazole-betamethasone (LOTRISONE) cream for two weeks and was told not to use it after the two weeks she states that her heels are red cracking and scaly painful to walk she's tip toeing she wants to know what she should do no appt available at [facility] pt regarding symptoms; she says that her feet are sore and she is having cracks in both heels; she said prior to using the lotrisone, she had  Been using cerave'; the pt says Dr Wynetta Emery told her that had a fungus; the pt states that Mateo Flow, RN contacted her; and scheduled f with Dr Park Liter, [facility], 9/ 19/19 at 1530

## 2017-12-14 NOTE — Telephone Encounter (Signed)
Outgoing call to patient, relating to complaint of  Scaly cracked painful sole of feet.  States she has been soaking her feet in epsom salt then applying neosporin and the Lotrisone.  Wanted to know if she should be doing anything else.  States her dermatology appointment is not until February.  Dr. Wynetta Emery diagnosed as a fungus.  Provided care advice.  Voiced understanding.    Reason for Disposition . [1] After 2 weeks on treatment AND [2] rash has not disappeared  Answer Assessment - Initial Assessment Questions 1.) CALLER DIAGNOSIS: "What do you think is causing the rash?" (e.g., Chickenpox, Hives, Impetigo, Athlete's Foot, etc.)  2.) LOCATION:  "Is it widespread or localized?"  3.) NEW MEDICATIONS: "Are you taking any new medicine?" Scaly bottom of feet cracked  Sore and sore. , neosporin.  Last nightg dont understand scaling and sorness.  , sore all night. very scaly.  Answer Assessment - Initial Assessment Questions 1. APPEARANCE of RASH: "What does the rash look like?"      States cracked  2. LOCATION: "Which part of the foot is involved?" "Are both feet involved?"       Bottom of feet 3. SIZE: "How large is the infected area?" (Inches or centimeters)      varies 4. ONSET: "When did the rash start?"     *No Answer* 5. OTHER SYMPTOMS: "Do you have any other symptoms?" (e.g., fever)     none 6. PREGNANCY: "Is there any chance you are pregnant?" "When was your last menstrual period?"     na  Protocols used: ATHLETE'S FOOT-A-AH, RASH - GUIDELINE SELECTION-A-AH

## 2017-12-15 ENCOUNTER — Encounter: Payer: Self-pay | Admitting: Family Medicine

## 2017-12-15 ENCOUNTER — Ambulatory Visit (INDEPENDENT_AMBULATORY_CARE_PROVIDER_SITE_OTHER): Payer: PPO | Admitting: Family Medicine

## 2017-12-15 VITALS — BP 146/74 | HR 66 | Wt 146.4 lb

## 2017-12-15 DIAGNOSIS — G6289 Other specified polyneuropathies: Secondary | ICD-10-CM | POA: Diagnosis not present

## 2017-12-15 DIAGNOSIS — B353 Tinea pedis: Secondary | ICD-10-CM

## 2017-12-15 DIAGNOSIS — L602 Onychogryphosis: Secondary | ICD-10-CM

## 2017-12-15 MED ORDER — NORTRIPTYLINE HCL 10 MG PO CAPS: 10.0000 mg | ORAL_CAPSULE | Freq: Every day | ORAL | 3 refills | Status: DC

## 2017-12-15 NOTE — Progress Notes (Signed)
BP (!) 146/74 (BP Location: Left Arm, Patient Position: Sitting, Cuff Size: Normal)   Pulse 66   Wt 146 lb 7 oz (66.4 kg)   LMP 05/13/1980   SpO2 97%   BMI 25.14 kg/m    Subjective:    Patient ID: Cassie Brewer, female    DOB: 08-23-1928, 84 y.o.   MRN: [id number]  HPI: Cassie Brewer is a 84 y.o. female  Chief Complaint  Patient presents with  . Foot Pain   Has been using the lotrisone, but unsure if it's helping or not at this time. Notes that her rash on her feet is a little better, but also has a bit of burning when she goes to bed at night that's quite uncomfortable.   FOOT PAIN Duration: chronic Involved foot: bilateral Mechanism of injury: unknown Location: bottom of her feet Onset: gradual  Severity: moderate  Quality:  burning Frequency: constant- mainly at night Radiation: no Aggravating factors:laying down at night   Alleviating factors: nothing  Status: worse Treatments attempted: none  Relief with NSAIDs?:  no Weakness with weight bearing or walking: no Morning stiffness: no Swelling: no Redness: no Bruising: no Paresthesias / decreased sensation: no  Fevers:no  RASH Duration:  weeks  Location: bottoms of her feet and legs  Itching: yes Burning: yes Redness: yes Oozing: no Scaling: yes Blisters: no Painful: no Fevers: no Change in detergents/soaps/personal care products: no Recent illness: no Recent travel:no History of same: yes Context: stable Alleviating factors: nothing Treatments attempted:lotion/moisturizer Shortness of breath: no  Throat/tongue swelling: no Myalgias/arthralgias: no  Relevant past medical, surgical, family and social history reviewed and updated as indicated. Interim medical history since our last visit reviewed. Allergies and medications reviewed and updated.  Review of Systems  Constitutional: Negative.   Respiratory: Negative.   Cardiovascular: Negative.   Neurological: Positive for numbness. Negative  for dizziness, tremors, seizures, syncope, facial asymmetry, speech difficulty, weakness, light-headedness and headaches.  Psychiatric/Behavioral: Negative.     Per HPI unless specifically indicated above     Objective:    BP (!) 146/74 (BP Location: Left Arm, Patient Position: Sitting, Cuff Size: Normal)   Pulse 66   Wt 146 lb 7 oz (66.4 kg)   LMP 05/13/1980   SpO2 97%   BMI 25.14 kg/m   Wt Readings from Last 3 Encounters:  12/15/17 146 lb 7 oz (66.4 kg)  11/23/17 142 lb 9 oz (64.7 kg)  11/14/17 143 lb 12.8 oz (65.2 kg)    Physical Exam  Constitutional: She is oriented to person, place, and time. She appears well-developed and well-nourished. No distress.  HENT:  Head: Normocephalic and atraumatic.  Right Ear: Hearing normal.  Left Ear: Hearing normal.  Nose: Nose normal.  Eyes: Conjunctivae and lids are normal. Right eye exhibits no discharge. Left eye exhibits no discharge. No scleral icterus.  Cardiovascular: Normal rate, regular rhythm, normal heart sounds and intact distal pulses. Exam reveals no gallop and no friction rub.  No murmur heard. Pulmonary/Chest: Effort normal and breath sounds normal. No stridor. No respiratory distress. She has no wheezes. She has no rales. She exhibits no tenderness.  Musculoskeletal: Normal range of motion.  Neurological: She is alert and oriented to person, place, and time.  Skin: Skin is warm, dry and intact. Capillary refill takes less than 2 seconds. No rash noted. She is not diaphoretic. There is erythema (dry cracked skin on the bottom of her feet- improving). No pallor.  Psychiatric: She has a normal  mood and affect. Her speech is normal and behavior is normal. Judgment and thought content normal. Cognition and memory are normal.    Results for orders placed or performed in visit on 11/23/17  CBC with Differential/Platelet  Result Value Ref Range   WBC 4.7 3.4 - 10.8 x10E3/uL   RBC 4.27 3.77 - 5.28 x10E6/uL   Hemoglobin 12.4  11.1 - 15.9 g/dL   Hematocrit 37.4 34.0 - 46.6 %   MCV 88 79 - 97 fL   MCH 29.0 26.6 - 33.0 pg   MCHC 33.2 31.5 - 35.7 g/dL   RDW 14.6 12.3 - 15.4 %   Platelets 311 150 - 450 x10E3/uL   Neutrophils 60 Not Estab. %   Lymphs 25 Not Estab. %   Monocytes 11 Not Estab. %   Eos 3 Not Estab. %   Basos 1 Not Estab. %   Neutrophils Absolute 2.8 1.4 - 7.0 x10E3/uL   Lymphocytes Absolute 1.2 0.7 - 3.1 x10E3/uL   Monocytes Absolute 0.5 0.1 - 0.9 x10E3/uL   EOS (ABSOLUTE) 0.2 0.0 - 0.4 x10E3/uL   Basophils Absolute 0.1 0.0 - 0.2 x10E3/uL   Immature Granulocytes 0 Not Estab. %   Immature Grans (Abs) 0.0 0.0 - 0.1 x10E3/uL  Comprehensive metabolic panel  Result Value Ref Range   Glucose 105 (H) 65 - 99 mg/dL   BUN 13 8 - 27 mg/dL   Creatinine, Ser 1.00 0.57 - 1.00 mg/dL   GFR calc non Af Amer 51 (L) >59 mL/min/1.73   GFR calc Af Amer 59 (L) >59 mL/min/1.73   BUN/Creatinine Ratio 13 12 - 28   Sodium 141 134 - 144 mmol/L   Potassium 4.1 3.5 - 5.2 mmol/L   Chloride 101 96 - 106 mmol/L   CO2 24 20 - 29 mmol/L   Calcium 9.9 8.7 - 10.3 mg/dL   Total Protein 7.1 6.0 - 8.5 g/dL   Albumin 4.4 3.5 - 4.7 g/dL   Globulin, Total 2.7 1.5 - 4.5 g/dL   Albumin/Globulin Ratio 1.6 1.2 - 2.2   Bilirubin Total 0.6 0.0 - 1.2 mg/dL   Alkaline Phosphatase 56 39 - 117 IU/L   AST 16 0 - 40 IU/L   ALT 12 0 - 32 IU/L  Lipid Panel w/o Chol/HDL Ratio  Result Value Ref Range   Cholesterol, Total 248 (H) 100 - 199 mg/dL   Triglycerides 79 0 - 149 mg/dL   HDL 73 >39 mg/dL   VLDL Cholesterol Cal 16 5 - 40 mg/dL   LDL Calculated 159 (H) 0 - 99 mg/dL  Microalbumin, Urine Waived  Result Value Ref Range   Microalb, Ur Waived 10 0 - 19 mg/L   Creatinine, Urine Waived 50 10 - 300 mg/dL   Microalb/Creat Ratio 30-300 (H) <30 mg/g  TSH  Result Value Ref Range   TSH 0.703 0.450 - 4.500 uIU/mL  UA/M w/rflx Culture, Routine  Result Value Ref Range   Specific Gravity, UA 1.015 1.005 - 1.030   pH, UA 7.0 5.0 - 7.5    Color, UA Yellow Yellow   Appearance Ur Clear Clear   Leukocytes, UA Negative Negative   Protein, UA Negative Negative/Trace   Glucose, UA Negative Negative   Ketones, UA Negative Negative   RBC, UA Negative Negative   Bilirubin, UA Negative Negative   Urobilinogen, Ur 0.2 0.2 - 1.0 mg/dL   Nitrite, UA Negative Negative      Assessment & Plan:   Problem List Items Addressed This Visit  None    Visit Diagnoses    Other polyneuropathy    -  Primary   Will start nortriptyline. Recheck 4-6 weeks. Call with any concerns.    Relevant Medications   nortriptyline (PAMELOR) 10 MG capsule   Other Relevant Orders   Ambulatory referral to Podiatry   Tinea pedis of both feet       Improving. Would like to see podiatry. Referral generated today. Call with any concerns. Continue lotrisone.   Relevant Orders   Ambulatory referral to Podiatry   Overgrown toenails       Would like to see podiatry. Call with any concerns.    Relevant Orders   Ambulatory referral to Podiatry       Follow up plan: Return in about 4 weeks (around 01/12/2018) for follow up foot pain.

## 2017-12-19 ENCOUNTER — Other Ambulatory Visit: Payer: Self-pay | Admitting: Family Medicine

## 2017-12-20 ENCOUNTER — Other Ambulatory Visit: Payer: Self-pay | Admitting: Family Medicine

## 2017-12-20 NOTE — Addendum Note (Signed)
Addended by: Gerda Diss A on: 12/20/2017 03:04 PM   Modules accepted: Orders

## 2017-12-20 NOTE — Telephone Encounter (Signed)
Pt called back and has no idea of what medication is being discussed; contact pt back

## 2017-12-20 NOTE — Telephone Encounter (Signed)
Was just started on this medicine. I will not give her 90 days until I know she tolerates it in 1 month. I've refused this 2x before. Please have them stop sending it to me.

## 2017-12-20 NOTE — Telephone Encounter (Signed)
Left message on machine for pt to return call to the office. CRM created.  

## 2017-12-20 NOTE — Telephone Encounter (Signed)
Called and spoke to patient. She does not want a 90 day supply. She had called the pharmacy to cancel the RX. Patient stated she looked medication up and is afraid of becoming dependant on medication.

## 2018-01-02 ENCOUNTER — Encounter: Payer: Self-pay | Admitting: Podiatry

## 2018-01-02 ENCOUNTER — Ambulatory Visit (INDEPENDENT_AMBULATORY_CARE_PROVIDER_SITE_OTHER): Payer: PPO | Admitting: Podiatry

## 2018-01-02 VITALS — BP 142/79 | HR 72

## 2018-01-02 DIAGNOSIS — M79675 Pain in left toe(s): Secondary | ICD-10-CM | POA: Diagnosis not present

## 2018-01-02 DIAGNOSIS — B351 Tinea unguium: Secondary | ICD-10-CM | POA: Diagnosis not present

## 2018-01-02 DIAGNOSIS — M79674 Pain in right toe(s): Secondary | ICD-10-CM | POA: Diagnosis not present

## 2018-01-02 DIAGNOSIS — L309 Dermatitis, unspecified: Secondary | ICD-10-CM | POA: Diagnosis not present

## 2018-01-02 NOTE — Progress Notes (Signed)
This patient presents to the office with chief complaint of dry scaly skin noted on the bottom of both feet.  She also says that she has dry skin on the outside border of both feet where there are breaks in her skin.  She says she was seen by her medical doctor who prescribed Lotrisone.  She says that she discontinued applying the Lotrisone about 1-1/2 weeks ago.  She believes the Lotrisone caused an increase in the cracking and itching on both feet.  She says he has been applying moisturizer but the problem continues.  She also admits to soaking her foot in  Epson salts.  She says she has developed pain walking on her feet due to these cracks in her skin.  Patient does admit that she has a sweating problem which causes her to change her socks twice daily..  She presents the office today for an evaluation and treatment of her feet.  She also admits that her to big toenails are long thick and painful causing pain when she walks.  She presents the office today for an evaluation of both of these problems.  General Appearance  Alert, conversant and in no acute stress.  Vascular  Dorsalis pedis and posterior tibial  pulses are palpable  bilaterally.  Capillary return is within normal limits  bilaterally. Temperature is within normal limits  bilaterally.  Neurologic  Senn-Weinstein monofilament wire test within normal limits  bilaterally. Muscle power within normal limits bilaterally.  Nails Thick disfigured discolored nails with subungual debris   hallux  bilaterally. No evidence of bacterial infection or drainage bilaterally.  Orthopedic  No limitations of motion  feet .  No crepitus or effusions noted.  No bony pathology or digital deformities noted.  Skin dry scaly skin noted on the plantar aspect of both feet.  Patient has dry scaly fissured skin noted along the lateral aspect of both feet.  No evidence of any redness drainage or infection.    Dermatitis  B/L.  Fissures skin both feet.  Onychomycosis   Hallux  B/L.  IE.  Debridement and grinding of long thick painful hallux toenails.  Discussed her skin condition with this patient.  Told her I am concerned about the fissures that have developed on the outside of both feet.  I am concerned about the reaction that she is developing on the bottom and the outside of both feet.  This makes me think there is an allergic reaction.  She does describe significant sweating in her socks.  She may have hyperhidrosis.  Was given the following instructions.  She is to soak her feet in  tea at home.  She is to apply powders to try to prevent moisture accumulation.  Patient was told to discontinue Epson salt soaks.  She was told to apply moisturizer after soaking her feet into tea.  RTC 3 weeks.   Gardiner Barefoot DPM

## 2018-01-19 ENCOUNTER — Ambulatory Visit: Payer: PPO | Admitting: Family Medicine

## 2018-01-19 DIAGNOSIS — K219 Gastro-esophageal reflux disease without esophagitis: Secondary | ICD-10-CM | POA: Diagnosis not present

## 2018-01-19 DIAGNOSIS — J301 Allergic rhinitis due to pollen: Secondary | ICD-10-CM | POA: Diagnosis not present

## 2018-01-22 ENCOUNTER — Other Ambulatory Visit: Payer: Self-pay | Admitting: Family Medicine

## 2018-01-22 DIAGNOSIS — E039 Hypothyroidism, unspecified: Secondary | ICD-10-CM

## 2018-01-23 DIAGNOSIS — H401131 Primary open-angle glaucoma, bilateral, mild stage: Secondary | ICD-10-CM | POA: Diagnosis not present

## 2018-01-23 NOTE — Telephone Encounter (Signed)
Requested medication (s) are due for refill today: yes  Requested medication (s) are on the active medication list: yes  Last refill:  01/24/17  Future visit scheduled: yes  Notes to clinic:  prescription expires on 01/24/18. LOV  12/15/17  NOV  05/25/18  Requested Prescriptions  Pending Prescriptions Disp Refills   levothyroxine (SYNTHROID, LEVOTHROID) 112 MCG tablet [Pharmacy Med Name: LEVOTHYROXINE 112 MCG TABLET] 90 tablet 3    Sig: TAKE 1 TABLET (112 MCG TOTAL) BY MOUTH DAILY BEFORE BREAKFAST.     Endocrinology:  Hypothyroid Agents Failed - 01/22/2018  1:10 AM      Failed - TSH needs to be rechecked within 3 months after an abnormal result. Refill until TSH is due.      Passed - TSH in normal range and within 360 days    TSH  Date Value Ref Range Status  11/23/2017 0.703 0.450 - 4.500 uIU/mL Final         Passed - Valid encounter within last 12 months    Recent Outpatient Visits          1 month ago Other polyneuropathy   [place], [place], DO   2 months ago Routine general medical examination at a health care facility   [facility] [place], DO   8 months ago Benign hypertensive renal disease   [place], [place], DO   1 year ago Essential hypertension   [place], [place], DO   1 year ago Nail fungus   [place], [place], DO      Future Appointments            In 4 months Wynetta Emery, Barb Merino, DO [place], [place]   In 9 months  [employer], [place]

## 2018-01-26 ENCOUNTER — Encounter: Payer: Self-pay | Admitting: Podiatry

## 2018-01-26 ENCOUNTER — Ambulatory Visit (INDEPENDENT_AMBULATORY_CARE_PROVIDER_SITE_OTHER): Payer: PPO | Admitting: Podiatry

## 2018-01-26 DIAGNOSIS — L309 Dermatitis, unspecified: Secondary | ICD-10-CM | POA: Diagnosis not present

## 2018-01-26 NOTE — Progress Notes (Signed)
This patient presents to the office  with a diagnosis of a dermatitis both feet with fissures noted on feet.  She has been dealing with this condition and has been treated by a dermatologist as well a her medical doctor.  She presented to my office concerned about her , right foot, which was thick and painful.  I chose to attempt to treat her dermatitis and suggested she apply moisturizer after she soaked her foot with tea.  She says that her foot condition is about the same and she has developed red blotches on the top of both feet.  She has been treated by her medical doctors with subtraction of her daily medicine   from her daily usage but this foot problem persists.  She states she is still experiencing pain on walking  both feet.   She returns to the office today for continued evaluation and evaluation of her feet.  General Appearance  Alert, conversant and in no acute stress.  Vascular  Dorsalis pedis and posterior tibial  pulses are palpable  bilaterally.  Capillary return is within normal limits  bilaterally. Temperature is within normal limits  bilaterally.  Neurologic  Senn-Weinstein monofilament wire test within normal limits  bilaterally. Muscle power within normal limits bilaterally.  Nails Thick disfigured discolored nails with subungual debris   hallux  bilaterally. No evidence of bacterial infection or drainage bilaterally.  Orthopedic  No limitations of motion  feet .  No crepitus or effusions noted.  No bony pathology or digital deformities noted.  Skin  normotropic skin with no porokeratosis noted bilaterally.  No signs of infections or ulcers noted.  There is continued dry scaly skin noted on the plantar aspect both heels.  The fissures appear to be filling in and healing with no evidence of infection noted.  Dermatitis  B/L    ROV.  Discussed this condition with this patient.  I feel her condition is continuing and she is having  an allergic reaction from medication.  She is to  continue her soaks and moisturizing. She was told she could use triamcinolone on her feet.  She has a scheduled appointment with her other doctors in the near future.   RTC prn if the condition worsens.   Gardiner Barefoot DPM

## 2018-01-30 DIAGNOSIS — B9689 Other specified bacterial agents as the cause of diseases classified elsewhere: Secondary | ICD-10-CM | POA: Diagnosis not present

## 2018-01-30 DIAGNOSIS — J019 Acute sinusitis, unspecified: Secondary | ICD-10-CM | POA: Diagnosis not present

## 2018-01-30 DIAGNOSIS — K122 Cellulitis and abscess of mouth: Secondary | ICD-10-CM | POA: Diagnosis not present

## 2018-03-02 ENCOUNTER — Ambulatory Visit: Payer: PPO | Admitting: Podiatry

## 2018-03-02 ENCOUNTER — Encounter: Payer: Self-pay | Admitting: Podiatry

## 2018-03-02 DIAGNOSIS — B351 Tinea unguium: Secondary | ICD-10-CM | POA: Diagnosis not present

## 2018-03-02 DIAGNOSIS — L84 Corns and callosities: Secondary | ICD-10-CM

## 2018-03-02 DIAGNOSIS — M79675 Pain in left toe(s): Secondary | ICD-10-CM | POA: Diagnosis not present

## 2018-03-02 DIAGNOSIS — L309 Dermatitis, unspecified: Secondary | ICD-10-CM | POA: Diagnosis not present

## 2018-03-02 DIAGNOSIS — M79674 Pain in right toe(s): Secondary | ICD-10-CM

## 2018-03-02 MED ORDER — AMMONIUM LACTATE 12 % EX CREA: TOPICAL_CREAM | CUTANEOUS | 0 refills | Status: DC | PRN

## 2018-03-02 NOTE — Progress Notes (Signed)
This patient presents the office with chief complaint of a painful third toe right foot.  She says the corner to develop has been painful for the last few weeks on and off.  She says the pain today is very severe and desires an evaluation and treatment.  Patient also has long thick nails which are painful walking and wearing her shoes.  She continues to have dry skin on the bottom of both feet for which she has been soaking and applying medication.  She presents the office today for an evaluation and treatment of her feet  General Appearance  Alert, conversant and in no acute stress.  Vascular  Dorsalis pedis and posterior tibial  pulses are palpable  bilaterally.  Capillary return is within normal limits  bilaterally. Temperature is within normal limits  bilaterally.  Neurologic  Senn-Weinstein monofilament wire test within normal limits  bilaterally. Muscle power within normal limits bilaterally.  Nails Thick disfigured discolored nails with subungual debris   hallux  bilaterally. No evidence of bacterial infection or drainage bilaterally.  Orthopedic  No limitations of motion  feet .  No crepitus or effusions noted.  No bony pathology . Hammer toes  B/l.  Skin  normotropic skin with no porokeratosis noted bilaterally.  No signs of infections or ulcers noted.  Painful corn third toe right foot medially at the DIPJ.  Redness swelling and pain noted.  Inflamed corn third toe right foot.  Onychomycosis Hallux  B/L  ROV.  Discussed her skin and she was given a prescription for las-hydrin.  Debride nails  X 2.  Debride corn followed by injection therapy inflamed corn right foot.  Used 0.5 cc of kenalog/lidocaine and dexamehhazone phosphate.  RTC 3 months   Gardiner Barefoot DPM

## 2018-03-09 DIAGNOSIS — X32XXXA Exposure to sunlight, initial encounter: Secondary | ICD-10-CM | POA: Diagnosis not present

## 2018-03-09 DIAGNOSIS — L570 Actinic keratosis: Secondary | ICD-10-CM | POA: Diagnosis not present

## 2018-03-09 DIAGNOSIS — D485 Neoplasm of uncertain behavior of skin: Secondary | ICD-10-CM | POA: Diagnosis not present

## 2018-03-09 DIAGNOSIS — C44622 Squamous cell carcinoma of skin of right upper limb, including shoulder: Secondary | ICD-10-CM | POA: Diagnosis not present

## 2018-03-17 DIAGNOSIS — K219 Gastro-esophageal reflux disease without esophagitis: Secondary | ICD-10-CM | POA: Diagnosis not present

## 2018-03-17 DIAGNOSIS — R490 Dysphonia: Secondary | ICD-10-CM | POA: Diagnosis not present

## 2018-04-07 DIAGNOSIS — C44622 Squamous cell carcinoma of skin of right upper limb, including shoulder: Secondary | ICD-10-CM | POA: Diagnosis not present

## 2018-05-03 DIAGNOSIS — Z08 Encounter for follow-up examination after completed treatment for malignant neoplasm: Secondary | ICD-10-CM | POA: Diagnosis not present

## 2018-05-03 DIAGNOSIS — C44622 Squamous cell carcinoma of skin of right upper limb, including shoulder: Secondary | ICD-10-CM | POA: Diagnosis not present

## 2018-05-03 DIAGNOSIS — Z85828 Personal history of other malignant neoplasm of skin: Secondary | ICD-10-CM | POA: Diagnosis not present

## 2018-05-03 DIAGNOSIS — L309 Dermatitis, unspecified: Secondary | ICD-10-CM | POA: Diagnosis not present

## 2018-05-03 DIAGNOSIS — L308 Other specified dermatitis: Secondary | ICD-10-CM | POA: Diagnosis not present

## 2018-05-03 DIAGNOSIS — L821 Other seborrheic keratosis: Secondary | ICD-10-CM | POA: Diagnosis not present

## 2018-05-15 DIAGNOSIS — C44622 Squamous cell carcinoma of skin of right upper limb, including shoulder: Secondary | ICD-10-CM | POA: Diagnosis not present

## 2018-05-25 ENCOUNTER — Encounter: Payer: Self-pay | Admitting: Family Medicine

## 2018-05-25 ENCOUNTER — Other Ambulatory Visit: Payer: Self-pay

## 2018-05-25 ENCOUNTER — Ambulatory Visit (INDEPENDENT_AMBULATORY_CARE_PROVIDER_SITE_OTHER): Payer: PPO | Admitting: Family Medicine

## 2018-05-25 VITALS — BP 130/64 | HR 72 | Temp 98.4°F | Ht 64.0 in | Wt 146.5 lb

## 2018-05-25 DIAGNOSIS — N183 Chronic kidney disease, stage 3 unspecified: Secondary | ICD-10-CM

## 2018-05-25 DIAGNOSIS — I129 Hypertensive chronic kidney disease with stage 1 through stage 4 chronic kidney disease, or unspecified chronic kidney disease: Secondary | ICD-10-CM

## 2018-05-25 DIAGNOSIS — E038 Other specified hypothyroidism: Secondary | ICD-10-CM

## 2018-05-25 DIAGNOSIS — E063 Autoimmune thyroiditis: Secondary | ICD-10-CM

## 2018-05-25 DIAGNOSIS — E785 Hyperlipidemia, unspecified: Secondary | ICD-10-CM

## 2018-05-25 DIAGNOSIS — E039 Hypothyroidism, unspecified: Secondary | ICD-10-CM | POA: Diagnosis not present

## 2018-05-25 DIAGNOSIS — I10 Essential (primary) hypertension: Secondary | ICD-10-CM | POA: Diagnosis not present

## 2018-05-25 DIAGNOSIS — B370 Candidal stomatitis: Secondary | ICD-10-CM

## 2018-05-25 MED ORDER — HYDROCHLOROTHIAZIDE 25 MG PO TABS: 25.0000 mg | ORAL_TABLET | Freq: Every day | ORAL | 1 refills | Status: DC

## 2018-05-25 MED ORDER — MAGIC MOUTHWASH W/LIDOCAINE: 5.0000 mL | Freq: Three times a day (TID) | ORAL | 0 refills | Status: DC

## 2018-05-25 MED ORDER — LEVOTHYROXINE SODIUM 112 MCG PO TABS: 112.0000 ug | ORAL_TABLET | Freq: Every day | ORAL | 1 refills | Status: DC

## 2018-05-25 MED ORDER — BENAZEPRIL HCL 40 MG PO TABS: 40.0000 mg | ORAL_TABLET | Freq: Every day | ORAL | 1 refills | Status: DC

## 2018-05-25 NOTE — Assessment & Plan Note (Signed)
Under good control on current regimen. Continue current regimen. Continue to monitor. Call with any concerns. Refills given. Labs checked today.  

## 2018-05-25 NOTE — Progress Notes (Signed)
BP 130/64 (BP Location: Left Arm, Cuff Size: Normal)   Pulse 72   Temp 98.4 F (36.9 C) (Oral)   Ht 5\' 4"  (1.626 m)   Wt 146 lb 8 oz (66.5 kg)   LMP 05/13/1980   SpO2 99%   BMI 25.15 kg/m    Subjective:    Patient ID: Cassie Brewer, female    DOB: 05/10/1928, 85 y.o.   MRN: [id number]  HPI: Cassie Brewer is a 85 y.o. female  Chief Complaint  Patient presents with  . Hyperlipidemia    6 month F/U  . Hypertension  . Hypothyroidism  . Chronic Kidney Disease  . Glossitis    woke up with her tongue swollen this morning   HYPERTENSION / HYPERLIPIDEMIA Satisfied with current treatment? yes Duration of hypertension: chronic BP monitoring frequency: not checking BP range:  BP medication side effects: no Past BP meds: benazepril and HCTZ Duration of hyperlipidemia: chronic Cholesterol medication side effects: Not on anything Cholesterol supplements: fish oil Past cholesterol medications: none Medication compliance: good compliance Aspirin: no Recent stressors: no Recurrent headaches: no Visual changes: no Palpitations: no Dyspnea: no Chest pain: no Lower extremity edema: no Dizzy/lightheaded: no  HYPOTHYROIDISM Thyroid control status:stable Satisfied with current treatment? yes Medication side effects: no Medication compliance: excellent compliance Recent dose adjustment:no Fatigue: no Cold intolerance: no Heat intolerance: no Weight gain: no Weight loss: no Constipation: no Diarrhea/loose stools: no Palpitations: no Lower extremity edema: no Anxiety/depressed mood: no  Woke up this morning with a very sore tongue this morning. Was rinsing with warm salt water  Relevant past medical, surgical, family and social history reviewed and updated as indicated. Interim medical history since our last visit reviewed. Allergies and medications reviewed and updated.  Review of Systems  Constitutional: Negative.   HENT: Positive for mouth sores. Negative for  congestion, dental problem, drooling, ear discharge, ear pain, facial swelling, hearing loss, nosebleeds, postnasal drip, rhinorrhea, sinus pressure, sinus pain, sneezing, sore throat, tinnitus, trouble swallowing and voice change.   Respiratory: Negative.   Cardiovascular: Negative.   Neurological: Negative.   Psychiatric/Behavioral: Negative.     Per HPI unless specifically indicated above     Objective:    BP 130/64 (BP Location: Left Arm, Cuff Size: Normal)   Pulse 72   Temp 98.4 F (36.9 C) (Oral)   Ht 5\' 4"  (1.626 m)   Wt 146 lb 8 oz (66.5 kg)   LMP 05/13/1980   SpO2 99%   BMI 25.15 kg/m   Wt Readings from Last 3 Encounters:  05/25/18 146 lb 8 oz (66.5 kg)  12/15/17 146 lb 7 oz (66.4 kg)  11/23/17 142 lb 9 oz (64.7 kg)    Physical Exam Vitals signs and nursing note reviewed.  Constitutional:      General: She is not in acute distress.    Appearance: Normal appearance. She is not ill-appearing, toxic-appearing or diaphoretic.  HENT:     Head: Normocephalic and atraumatic.     Comments: Cracked red tongue    Right Ear: External ear normal.     Left Ear: External ear normal.     Nose: Nose normal.     Mouth/Throat:     Mouth: Mucous membranes are moist.     Pharynx: Oropharynx is clear.  Eyes:     General: No scleral icterus.       Right eye: No discharge.        Left eye: No discharge.  Extraocular Movements: Extraocular movements intact.     Conjunctiva/sclera: Conjunctivae normal.     Pupils: Pupils are equal, round, and reactive to light.  Neck:     Musculoskeletal: Normal range of motion and neck supple.  Cardiovascular:     Rate and Rhythm: Normal rate and regular rhythm.     Pulses: Normal pulses.     Heart sounds: Normal heart sounds. No murmur. No friction rub. No gallop.   Pulmonary:     Effort: Pulmonary effort is normal. No respiratory distress.     Breath sounds: Normal breath sounds. No stridor. No wheezing, rhonchi or rales.  Chest:      Chest wall: No tenderness.  Musculoskeletal: Normal range of motion.  Skin:    General: Skin is warm and dry.     Capillary Refill: Capillary refill takes less than 2 seconds.     Coloration: Skin is not jaundiced or pale.     Findings: No bruising, erythema, lesion or rash.  Neurological:     General: No focal deficit present.     Mental Status: She is alert and oriented to person, place, and time. Mental status is at baseline.  Psychiatric:        Mood and Affect: Mood normal.        Behavior: Behavior normal.        Thought Content: Thought content normal.        Judgment: Judgment normal.     Results for orders placed or performed in visit on 11/23/17  CBC with Differential/Platelet  Result Value Ref Range   WBC 4.7 3.4 - 10.8 x10E3/uL   RBC 4.27 3.77 - 5.28 x10E6/uL   Hemoglobin 12.4 11.1 - 15.9 g/dL   Hematocrit 37.4 34.0 - 46.6 %   MCV 88 79 - 97 fL   MCH 29.0 26.6 - 33.0 pg   MCHC 33.2 31.5 - 35.7 g/dL   RDW 14.6 12.3 - 15.4 %   Platelets 311 150 - 450 x10E3/uL   Neutrophils 60 Not Estab. %   Lymphs 25 Not Estab. %   Monocytes 11 Not Estab. %   Eos 3 Not Estab. %   Basos 1 Not Estab. %   Neutrophils Absolute 2.8 1.4 - 7.0 x10E3/uL   Lymphocytes Absolute 1.2 0.7 - 3.1 x10E3/uL   Monocytes Absolute 0.5 0.1 - 0.9 x10E3/uL   EOS (ABSOLUTE) 0.2 0.0 - 0.4 x10E3/uL   Basophils Absolute 0.1 0.0 - 0.2 x10E3/uL   Immature Granulocytes 0 Not Estab. %   Immature Grans (Abs) 0.0 0.0 - 0.1 x10E3/uL  Comprehensive metabolic panel  Result Value Ref Range   Glucose 105 (H) 65 - 99 mg/dL   BUN 13 8 - 27 mg/dL   Creatinine, Ser 1.00 0.57 - 1.00 mg/dL   GFR calc non Af Amer 51 (L) >59 mL/min/1.73   GFR calc Af Amer 59 (L) >59 mL/min/1.73   BUN/Creatinine Ratio 13 12 - 28   Sodium 141 134 - 144 mmol/L   Potassium 4.1 3.5 - 5.2 mmol/L   Chloride 101 96 - 106 mmol/L   CO2 24 20 - 29 mmol/L   Calcium 9.9 8.7 - 10.3 mg/dL   Total Protein 7.1 6.0 - 8.5 g/dL   Albumin 4.4 3.5 -  4.7 g/dL   Globulin, Total 2.7 1.5 - 4.5 g/dL   Albumin/Globulin Ratio 1.6 1.2 - 2.2   Bilirubin Total 0.6 0.0 - 1.2 mg/dL   Alkaline Phosphatase 56 39 - 117 IU/L  AST 16 0 - 40 IU/L   ALT 12 0 - 32 IU/L  Lipid Panel w/o Chol/HDL Ratio  Result Value Ref Range   Cholesterol, Total 248 (H) 100 - 199 mg/dL   Triglycerides 79 0 - 149 mg/dL   HDL 73 >39 mg/dL   VLDL Cholesterol Cal 16 5 - 40 mg/dL   LDL Calculated 159 (H) 0 - 99 mg/dL  Microalbumin, Urine Waived  Result Value Ref Range   Microalb, Ur Waived 10 0 - 19 mg/L   Creatinine, Urine Waived 50 10 - 300 mg/dL   Microalb/Creat Ratio 30-300 (H) <30 mg/g  TSH  Result Value Ref Range   TSH 0.703 0.450 - 4.500 uIU/mL  UA/M w/rflx Culture, Routine  Result Value Ref Range   Specific Gravity, UA 1.015 1.005 - 1.030   pH, UA 7.0 5.0 - 7.5   Color, UA Yellow Yellow   Appearance Ur Clear Clear   Leukocytes, UA Negative Negative   Protein, UA Negative Negative/Trace   Glucose, UA Negative Negative   Ketones, UA Negative Negative   RBC, UA Negative Negative   Bilirubin, UA Negative Negative   Urobilinogen, Ur 0.2 0.2 - 1.0 mg/dL   Nitrite, UA Negative Negative      Assessment & Plan:   Problem List Items Addressed This Visit      Endocrine   Hypothyroidism - Primary    Will check labs and treat as needed. Call with any concerns.       Relevant Medications   levothyroxine (SYNTHROID, LEVOTHROID) 112 MCG tablet   Other Relevant Orders   Comprehensive metabolic panel   TSH     Genitourinary   Benign hypertensive renal disease    Under good control on current regimen. Continue current regimen. Continue to monitor. Call with any concerns. Refills given. Labs checked today.       Relevant Medications   benazepril (LOTENSIN) 40 MG tablet   hydrochlorothiazide (HYDRODIURIL) 25 MG tablet   Other Relevant Orders   Comprehensive metabolic panel   CKD (chronic kidney disease), stage III (HCC)    Checking levels today. Await  results. Call with any concerns.       Relevant Orders   Comprehensive metabolic panel     Other   Hyperlipidemia    Under good control on current regimen. Continue current regimen. Continue to monitor. Call with any concerns. Refills given. Labs checked today.      Relevant Medications   benazepril (LOTENSIN) 40 MG tablet   hydrochlorothiazide (HYDRODIURIL) 25 MG tablet   Other Relevant Orders   Comprehensive metabolic panel   Lipid Panel w/o Chol/HDL Ratio    Other Visit Diagnoses    Thrush       Will treat with magic mouth wash. Call with any concerns.    Relevant Medications   magic mouthwash w/lidocaine SOLN   Essential hypertension       Relevant Medications   benazepril (LOTENSIN) 40 MG tablet   hydrochlorothiazide (HYDRODIURIL) 25 MG tablet       Follow up plan: Return in about 6 months (around 11/23/2018) for Physical/Wellness.

## 2018-05-25 NOTE — Assessment & Plan Note (Signed)
Will check labs and treat as needed. Call with any concerns.  

## 2018-05-25 NOTE — Assessment & Plan Note (Signed)
Checking levels today. Await results. Call with any concerns.

## 2018-05-26 LAB — COMPREHENSIVE METABOLIC PANEL
ALBUMIN: 4.5 g/dL (ref 3.6–4.6)
ALK PHOS: 61 IU/L (ref 39–117)
ALT: 14 IU/L (ref 0–32)
AST: 15 IU/L (ref 0–40)
Albumin/Globulin Ratio: 1.7 (ref 1.2–2.2)
BUN / CREAT RATIO: 14 (ref 12–28)
BUN: 14 mg/dL (ref 8–27)
Bilirubin Total: 0.5 mg/dL (ref 0.0–1.2)
CO2: 24 mmol/L (ref 20–29)
CREATININE: 1 mg/dL (ref 0.57–1.00)
Calcium: 9.7 mg/dL (ref 8.7–10.3)
Chloride: 101 mmol/L (ref 96–106)
GFR calc Af Amer: 59 mL/min/{1.73_m2} — ABNORMAL LOW (ref >59)
GFR calc non Af Amer: 51 mL/min/{1.73_m2} — ABNORMAL LOW (ref >59)
GLOBULIN, TOTAL: 2.7 g/dL (ref 1.5–4.5)
Glucose: 97 mg/dL (ref 65–99)
Potassium: 4.2 mmol/L (ref 3.5–5.2)
SODIUM: 138 mmol/L (ref 134–144)
Total Protein: 7.2 g/dL (ref 6.0–8.5)

## 2018-05-26 LAB — LIPID PANEL W/O CHOL/HDL RATIO
CHOLESTEROL TOTAL: 255 mg/dL — AB (ref 100–199)
HDL: 75 mg/dL (ref >39)
LDL CALC: 162 mg/dL — AB (ref 0–99)
Triglycerides: 92 mg/dL (ref 0–149)
VLDL Cholesterol Cal: 18 mg/dL (ref 5–40)

## 2018-05-26 LAB — TSH: TSH: 3.42 u[IU]/mL (ref 0.450–4.500)

## 2018-05-29 ENCOUNTER — Encounter: Payer: Self-pay | Admitting: Family Medicine

## 2018-06-01 ENCOUNTER — Ambulatory Visit: Payer: PPO | Admitting: Podiatry

## 2018-11-13 ENCOUNTER — Other Ambulatory Visit: Payer: Self-pay | Admitting: Family Medicine

## 2018-11-13 DIAGNOSIS — I10 Essential (primary) hypertension: Secondary | ICD-10-CM

## 2018-11-15 NOTE — Progress Notes (Signed)
Subjective:   Cassie Brewer is a 85 y.o. female who presents for Medicare Annual (Subsequent) preventive examination.  This visit is being conducted via phone call  - after an attmept to do on video chat - due to the COVID-19 pandemic. This patient has given me verbal consent via phone to conduct this visit, patient states they are participating from their home address. Some vital signs may be absent or patient reported.   Patient identification: identified by name, DOB, and current address.    Review of Systems:   Cardiac Risk Factors include: advanced age (>80men, >25 women);dyslipidemia;hypertension     Objective:     Vitals: LMP 05/13/1980   There is no height or weight on file to calculate BMI.  Advanced Directives 11/16/2018 11/14/2017 11/03/2016  Does Patient Have a Medical Advance Directive? Yes Yes Yes  Type of Advance Directive Living will;Healthcare Power of [place];Living will [place];Living will  Copy of [place] in Chart? No - copy requested No - copy requested No - copy requested    Tobacco Social History   Tobacco Use  Smoking Status Never Smoker  Smokeless Tobacco Never Used     Counseling given: Not Answered   Clinical Intake:  Pre-visit preparation completed: Yes  Pain : No/denies pain     Nutritional Risks: None Diabetes: No  How often do you need to have someone help you when you read instructions, pamphlets, or other written materials from your doctor or pharmacy?: 1 - Never  Interpreter Needed?: No  Information entered by :: Mana Haberl,LPN  Past Medical History:  Diagnosis Date  . Glaucoma   . Hyperlipidemia   . Hypertension   . Hypothyroidism 10/02/2014  . Overactive bladder   . Squamous cell skin cancer, face    Past Surgical History:  Procedure Laterality Date  . ABDOMINAL HYSTERECTOMY    . BREAST SURGERY    . THYROIDECTOMY, PARTIAL Right    Family  History  Problem Relation Age of Onset  . Hypertension Mother   . Diabetes Father   . Colon cancer Father   . Breast cancer Grandchild    Social History   Socioeconomic History  . Marital status: Widowed    Spouse name: Not on file  . Number of children: Not on file  . Years of education: Not on file  . Highest education level: Some college, no degree  Occupational History  . Not on file  Social Needs  . Financial resource strain: Not hard at all  . Food insecurity    Worry: Never true    Inability: Never true  . Transportation needs    Medical: No    Non-medical: No  Tobacco Use  . Smoking status: Never Smoker  . Smokeless tobacco: Never Used  Substance and Sexual Activity  . Alcohol use: No  . Drug use: No  . Sexual activity: Not Currently    Birth control/protection: Surgical  Lifestyle  . Physical activity    Days per week: 2 days    Minutes per session: 10 min  . Stress: Not at all  Relationships  . Social connections    Talks on phone: More than three times a week    Gets together: More than three times a week    Attends religious service: More than 4 times per year    Active member of club or organization: Yes    Attends meetings of clubs or organizations: More  than 4 times per year    Relationship status: Widowed  Other Topics Concern  . Not on file  Social History Narrative  . Not on file    Outpatient Encounter Medications as of 11/16/2018  Medication Sig  . ammonium lactate (LAC-HYDRIN) 12 % cream Apply topically as needed for dry skin.  Marland Kitchen benazepril (LOTENSIN) 40 MG tablet Take 1 tablet (40 mg total) by mouth daily.  . Calcium Carbonate-Vit D-Min (CALCIUM 600+D PLUS MINERALS) 600-400 MG-UNIT TABS   . hydrochlorothiazide (HYDRODIURIL) 25 MG tablet TAKE 1 TABLET BY MOUTH EVERY DAY  . latanoprost (XALATAN) 0.005 % ophthalmic solution Place 1 drop into both eyes at bedtime.  Marland Kitchen levothyroxine (SYNTHROID, LEVOTHROID) 112 MCG tablet Take 1 tablet (112 mcg  total) by mouth daily before breakfast.  . Multiple Vitamins-Minerals (CENTRUM SILVER ULTRA WOMENS PO) Take by mouth.  . Omega-3 Fatty Acids (FISH OIL) 1000 MG CAPS Take by mouth daily.  Marland Kitchen omeprazole (PRILOSEC) 40 MG capsule TAKE ONE PILL ONCE DAILY, 30 MINUTES PRIOR TO EVENING MEAL.  Marland Kitchen timolol (TIMOPTIC) 0.5 % ophthalmic solution   . vitamin C (ASCORBIC ACID) 500 MG tablet Take 500 mg by mouth daily.  . clotrimazole-betamethasone (LOTRISONE) cream Apply 1 application topically 2 (two) times daily. (Patient not taking: Reported on 11/16/2018)  . fluticasone (FLONASE) 50 MCG/ACT nasal spray SPRAY 2 SPRAYS INTO EACH NOSTRIL EVERY DAY  . triamcinolone ointment (KENALOG) 0.5 % Apply 1 application topically 2 (two) times daily. (Patient not taking: Reported on 11/16/2018)  . [DISCONTINUED] magic mouthwash w/lidocaine SOLN Take 5 mLs by mouth 3 (three) times daily. (Patient not taking: Reported on 11/16/2018)  . [DISCONTINUED] triamcinolone (KENALOG) 0.025 % cream Apply 1 application topically 2 (two) times daily.   No facility-administered encounter medications on file as of 11/16/2018.     Activities of Daily Living In your present state of health, do you have any difficulty performing the following activities: 11/16/2018  Hearing? N  Vision? N  Comment eyeglasses, goes to dr.woodard  Difficulty concentrating or making decisions? N  Walking or climbing stairs? Y  Comment only when carrying stuff  Dressing or bathing? N  Doing errands, shopping? N  Preparing Food and eating ? N  Using the Toilet? N  In the past six months, have you accidently leaked urine? Y  Comment wears poise  Do you have problems with loss of bowel control? N  Managing your Medications? N  Managing your Finances? N  Housekeeping or managing your Housekeeping? N  Some recent data might be hidden    Patient Care Team: Valerie Roys, DO as PCP - General (Family Medicine) Oneta Rack, MD (Dermatology) Sharlotte Alamo, [place] (Podiatry) Samara Deist, DPM as Referring Physician (Podiatry)    Assessment:   This is a routine wellness examination for Cassie Brewer.  Exercise Activities and Dietary recommendations Current Exercise Habits: Home exercise routine, Type of exercise: walking, Time (Minutes): 10, Frequency (Times/Week): 2, Weekly Exercise (Minutes/Week): 20, Intensity: Mild, Exercise limited by: None identified  Goals    . Increase water intake     Recommend drinking at least 6-7 glasses of water a day     . Prevent Falls       Fall Risk: Fall Risk  11/16/2018 11/14/2017 11/03/2016 05/05/2016 10/29/2015  Falls in the past year? 0 No Yes No No  Number falls in past yr: - - 2 or more - -  Injury with Fall? - - No - -  Follow up - -  Falls prevention discussed - -    FALL RISK PREVENTION PERTAINING TO THE HOME:  Any stairs in or around the home? Yes  If so, are there any without handrails? No   Home free of loose throw rugs in walkways, pet beds, electrical cords, etc? Yes  Adequate lighting in your home to reduce risk of falls? Yes   ASSISTIVE DEVICES UTILIZED TO PREVENT FALLS:  Life alert? No  Use of a cane, walker or w/c? Yes  walking stick for long walks outside Grab bars in the bathroom? Yes  Shower chair or bench in shower? No  Elevated toilet seat or a handicapped toilet? No   DME ORDERS:  DME order needed?  No   TIMED UP AND GO:  Unable to perform    Depression Screen PHQ 2/9 Scores 11/16/2018 11/14/2017 11/03/2016 10/29/2015  PHQ - 2 Score 1 0 0 0     Cognitive Function     6CIT Screen 11/14/2017 11/03/2016  What Year? 0 points 0 points  What month? 0 points 0 points  What time? 0 points 0 points  Count back from 20 0 points 0 points  Months in reverse 0 points 0 points  Repeat phrase 0 points 0 points  Total Score 0 0    Immunization History  Administered Date(s) Administered  . Influenza, High Dose Seasonal PF 01/30/2016, 05/13/2017, 11/24/2017  . Influenza,inj,Quad  PF,6+ Mos 01/21/2015  . Influenza-Unspecified 11/24/2017  . Pneumococcal Conjugate-13 10/01/2013  . Pneumococcal Polysaccharide-23 06/06/2006, 09/28/2006  . Td 09/28/2006, 11/10/2016    Qualifies for Shingles Vaccine? Yes  Zostavax completed n/a. Due for Shingrix. Education has been provided regarding the importance of this vaccine. Pt has been advised to call insurance company to determine out of pocket expense. Advised may also receive vaccine at local pharmacy or Health Dept. Verbalized acceptance and understanding.  Tdap: up to date   Flu Vaccine: Due 11/2018  Pneumococcal Vaccine: up to date   Screening Tests Health Maintenance  Topic Date Due  . INFLUENZA VACCINE  10/28/2018  . TETANUS/TDAP  11/11/2026  . DEXA SCAN  Completed  . PNA vac Low Risk Adult  Completed    Cancer Screenings:  Colorectal Screening: no longer required  Mammogram: no longer required  Bone Density: Completed 10/02/2013  Lung Cancer Screening: (Low Dose CT Chest recommended if Age 26-80 years, 30 pack-year currently smoking OR have quit w/in 15years.) does not qualify.     Additional Screening:  Hepatitis C Screening: does not qualify  Vision Screening: Recommended annual ophthalmology exams for early detection of glaucoma and other disorders of the eye. Is the patient up to date with their annual eye exam?  Yes  Who is the provider or what is the name of the office in which the pt attends annual eye exams? Dr.Woodard  Dental Screening: Recommended annual dental exams for proper oral hygiene  Community Resource Referral:  CRR required this visit?  No       Plan:  I have personally reviewed and addressed the Medicare Annual Wellness questionnaire and have noted the following in the patient's chart:  A. Medical and social history B. Use of alcohol, tobacco or illicit drugs  C. Current medications and supplements D. Functional ability and status E.  Nutritional status F.  Physical  activity G. Advance directives H. List of other physicians I.  Hospitalizations, surgeries, and ER visits in previous 12 months J.  [place] such as hearing and vision if needed, cognitive and depression  L. Referrals and appointments   In addition, I have reviewed and discussed with patient certain preventive protocols, quality metrics, and best practice recommendations. A written personalized care plan for preventive services as well as general preventive health recommendations were provided to patient.  Signed,    Bevelyn Ngo, LPN  11/14/2991 Nurse Health Advisor   Nurse Notes: none

## 2018-11-16 ENCOUNTER — Ambulatory Visit (INDEPENDENT_AMBULATORY_CARE_PROVIDER_SITE_OTHER): Payer: PPO

## 2018-11-16 DIAGNOSIS — Z0000 Encounter for general adult medical examination without abnormal findings: Secondary | ICD-10-CM

## 2018-11-16 NOTE — Patient Instructions (Addendum)
Cassie Brewer , Thank you for taking time to come for your Medicare Wellness Visit. I appreciate your ongoing commitment to your health goals. Please review the following plan we discussed and let me know if I can assist you in the future.   Screening recommendations/referrals: Colonoscopy: no longer required Mammogram: Please call [phone] to schedule your mammogram.  Bone Density: no longer required Recommended yearly ophthalmology/optometry visit for glaucoma screening and checkup Recommended yearly dental visit for hygiene and checkup  Vaccinations: Influenza vaccine: due 11/2018 Pneumococcal vaccine: up to date Tdap vaccine: up to date Shingles vaccine: shingrix eligible, check with your insurance for coverage     Advanced directives: Please bring a copy of your health care power of attorney and living will to the office at your convenience.  Conditions/risks identified: fall prevention discussed   Next appointment: Follow up in one year for your annual wellness visit.    Preventive Care 40 Years and Older, Female Preventive care refers to lifestyle choices and visits with your health care provider that can promote health and wellness. What does preventive care include?  A yearly physical exam. This is also called an annual well check.  Dental exams once or twice a year.  Routine eye exams. Ask your health care provider how often you should have your eyes checked.  Personal lifestyle choices, including:  Daily care of your teeth and gums.  Regular physical activity.  Eating a healthy diet.  Avoiding tobacco and drug use.  Limiting alcohol use.  Practicing safe sex.  Taking low-dose aspirin every day.  Taking vitamin and mineral supplements as recommended by your health care provider. What happens during an annual well check? The services and screenings done by your health care provider during your annual well check will depend on your age, overall health,  lifestyle risk factors, and family history of disease. Counseling  Your health care provider may ask you questions about your:  Alcohol use.  Tobacco use.  Drug use.  Emotional well-being.  Home and relationship well-being.  Sexual activity.  Eating habits.  History of falls.  Memory and ability to understand (cognition).  Work and work Statistician.  Reproductive health. Screening  You may have the following tests or measurements:  Height, weight, and BMI.  Blood pressure.  Lipid and cholesterol levels. These may be checked every 5 years, or more frequently if you are over 87 years old.  Skin check.  Lung cancer screening. You may have this screening every year starting at age 84 if you have a 30-pack-year history of smoking and currently smoke or have quit within the past 15 years.  Fecal occult blood test (FOBT) of the stool. You may have this test every year starting at age 69.  Flexible sigmoidoscopy or colonoscopy. You may have a sigmoidoscopy every 5 years or a colonoscopy every 10 years starting at age 11.  Hepatitis C blood test.  Hepatitis B blood test.  Sexually transmitted disease (STD) testing.  Diabetes screening. This is done by checking your blood sugar (glucose) after you have not eaten for a while (fasting). You may have this done every 1-3 years.  Bone density scan. This is done to screen for osteoporosis. You may have this done starting at age 36.  Mammogram. This may be done every 1-2 years. Talk to your health care provider about how often you should have regular mammograms. Talk with your health care provider about your test results, treatment options, and if necessary, the need for more  tests. Vaccines  Your health care provider may recommend certain vaccines, such as:  Influenza vaccine. This is recommended every year.  Tetanus, diphtheria, and acellular pertussis (Tdap, Td) vaccine. You may need a Td booster every 10 years.  Zoster  vaccine. You may need this after age 82.  Pneumococcal 13-valent conjugate (PCV13) vaccine. One dose is recommended after age 26.  Pneumococcal polysaccharide (PPSV23) vaccine. One dose is recommended after age 35. Talk to your health care provider about which screenings and vaccines you need and how often you need them. This information is not intended to replace advice given to you by your health care provider. Make sure you discuss any questions you have with your health care provider. Document Released: 04/11/2015 Document Revised: 12/03/2015 Document Reviewed: 01/14/2015 Elsevier Interactive Patient Education  2017 [place] Prevention in the Home Falls can cause injuries. They can happen to people of all ages. There are many things you can do to make your home safe and to help prevent falls. What can I do on the outside of my home?  Regularly fix the edges of walkways and driveways and fix any cracks.  Remove anything that might make you trip as you walk through a door, such as a raised step or threshold.  Trim any bushes or trees on the path to your home.  Use bright outdoor lighting.  Clear any walking paths of anything that might make someone trip, such as rocks or tools.  Regularly check to see if handrails are loose or broken. Make sure that both sides of any steps have handrails.  Any raised decks and porches should have guardrails on the edges.  Have any leaves, snow, or ice cleared regularly.  Use sand or salt on walking paths during winter.  Clean up any spills in your garage right away. This includes oil or grease spills. What can I do in the bathroom?  Use night lights.  Install grab bars by the toilet and in the tub and shower. Do not use towel bars as grab bars.  Use non-skid mats or decals in the tub or shower.  If you need to sit down in the shower, use a plastic, non-slip stool.  Keep the floor dry. Clean up any water that spills on the  floor as soon as it happens.  Remove soap buildup in the tub or shower regularly.  Attach bath mats securely with double-sided non-slip rug tape.  Do not have throw rugs and other things on the floor that can make you trip. What can I do in the bedroom?  Use night lights.  Make sure that you have a light by your bed that is easy to reach.  Do not use any sheets or blankets that are too big for your bed. They should not hang down onto the floor.  Have a firm chair that has side arms. You can use this for support while you get dressed.  Do not have throw rugs and other things on the floor that can make you trip. What can I do in the kitchen?  Clean up any spills right away.  Avoid walking on wet floors.  Keep items that you use a lot in easy-to-reach places.  If you need to reach something above you, use a strong step stool that has a grab bar.  Keep electrical cords out of the way.  Do not use floor polish or wax that makes floors slippery. If you must use wax, use non-skid floor  wax.  Do not have throw rugs and other things on the floor that can make you trip. What can I do with my stairs?  Do not leave any items on the stairs.  Make sure that there are handrails on both sides of the stairs and use them. Fix handrails that are broken or loose. Make sure that handrails are as long as the stairways.  Check any carpeting to make sure that it is firmly attached to the stairs. Fix any carpet that is loose or worn.  Avoid having throw rugs at the top or bottom of the stairs. If you do have throw rugs, attach them to the floor with carpet tape.  Make sure that you have a light switch at the top of the stairs and the bottom of the stairs. If you do not have them, ask someone to add them for you. What else can I do to help prevent falls?  Wear shoes that:  Do not have high heels.  Have rubber bottoms.  Are comfortable and fit you well.  Are closed at the toe. Do not wear  sandals.  If you use a stepladder:  Make sure that it is fully opened. Do not climb a closed stepladder.  Make sure that both sides of the stepladder are locked into place.  Ask someone to hold it for you, if possible.  Clearly mark and make sure that you can see:  Any grab bars or handrails.  First and last steps.  Where the edge of each step is.  Use tools that help you move around (mobility aids) if they are needed. These include:  Canes.  Walkers.  Scooters.  Crutches.  Turn on the lights when you go into a dark area. Replace any light bulbs as soon as they burn out.  Set up your furniture so you have a clear path. Avoid moving your furniture around.  If any of your floors are uneven, fix them.  If there are any pets around you, be aware of where they are.  Review your medicines with your doctor. Some medicines can make you feel dizzy. This can increase your chance of falling. Ask your doctor what other things that you can do to help prevent falls. This information is not intended to replace advice given to you by your health care provider. Make sure you discuss any questions you have with your health care provider. Document Released: 01/09/2009 Document Revised: 08/21/2015 Document Reviewed: 04/19/2014 Elsevier Interactive Patient Education  2017 [employer].

## 2018-11-25 NOTE — Patient Instructions (Addendum)
Call for your mammogram: [facility]  Address: [address], [place], [place] [address]  Phone: [phone]  Lemon drops for dry mouth   Health Maintenance After Age 83 After age 70, you are at a higher risk for certain long-term diseases and infections as well as injuries from falls. Falls are a major cause of broken bones and head injuries in people who are older than age 88. Getting regular preventive care can help to keep you healthy and well. Preventive care includes getting regular testing and making lifestyle changes as recommended by your health care provider. Talk with your health care provider about:  Which screenings and tests you should have. A screening is a test that checks for a disease when you have no symptoms.  A diet and exercise plan that is right for you. What should I know about screenings and tests to prevent falls? Screening and testing are the best ways to find a health problem early. Early diagnosis and treatment give you the best chance of managing medical conditions that are common after age 36. Certain conditions and lifestyle choices may make you more likely to have a fall. Your health care provider may recommend:  Regular vision checks. Poor vision and conditions such as cataracts can make you more likely to have a fall. If you wear glasses, make sure to get your prescription updated if your vision changes.  Medicine review. Work with your health care provider to regularly review all of the medicines you are taking, including over-the-counter medicines. Ask your health care provider about any side effects that may make you more likely to have a fall. Tell your health care provider if any medicines that you take make you feel dizzy or sleepy.  Osteoporosis screening. Osteoporosis is a condition that causes the bones to get weaker. This can make the bones weak and cause them to break more easily.  Blood pressure screening.  Blood pressure changes and medicines to control blood pressure can make you feel dizzy.  Strength and balance checks. Your health care provider may recommend certain tests to check your strength and balance while standing, walking, or changing positions.  Foot health exam. Foot pain and numbness, as well as not wearing proper footwear, can make you more likely to have a fall.  Depression screening. You may be more likely to have a fall if you have a fear of falling, feel emotionally low, or feel unable to do activities that you used to do.  Alcohol use screening. Using too much alcohol can affect your balance and may make you more likely to have a fall. What actions can I take to lower my risk of falls? General instructions  Talk with your health care provider about your risks for falling. Tell your health care provider if: ? You fall. Be sure to tell your health care provider about all falls, even ones that seem minor. ? You feel dizzy, sleepy, or off-balance.  Take over-the-counter and prescription medicines only as told by your health care provider. These include any supplements.  Eat a healthy diet and maintain a healthy weight. A healthy diet includes low-fat dairy products, low-fat (lean) meats, and fiber from whole grains, beans, and lots of fruits and vegetables. Home safety  Remove any tripping hazards, such as rugs, cords, and clutter.  Install safety equipment such as grab bars in bathrooms and safety rails on stairs.  Keep rooms and walkways well-lit. Activity   Follow a regular  exercise program to stay fit. This will help you maintain your balance. Ask your health care provider what types of exercise are appropriate for you.  If you need a cane or walker, use it as recommended by your health care provider.  Wear supportive shoes that have nonskid soles. Lifestyle  Do not drink alcohol if your health care provider tells you not to drink.  If you drink alcohol, limit  how much you have: ? 0-1 drink a day for women. ? 0-2 drinks a day for men.  Be aware of how much alcohol is in your drink. In the [place] one drink equals one typical bottle of beer (12 oz), one-half glass of wine (5 oz), or one shot of hard liquor (1 oz).  Do not use any products that contain nicotine or tobacco, such as cigarettes and e-cigarettes. If you need help quitting, ask your health care provider. Summary  Having a healthy lifestyle and getting preventive care can help to protect your health and wellness after age 72.  Screening and testing are the best way to find a health problem early and help you avoid having a fall. Early diagnosis and treatment give you the best chance for managing medical conditions that are more common for people who are older than age 30.  Falls are a major cause of broken bones and head injuries in people who are older than age 45. Take precautions to prevent a fall at home.  Work with your health care provider to learn what changes you can make to improve your health and wellness and to prevent falls. This information is not intended to replace advice given to you by your health care provider. Make sure you discuss any questions you have with your health care provider. Document Released: 01/26/2017 Document Revised: 07/06/2018 Document Reviewed: 01/26/2017 Elsevier Patient Education  2020 [employer].

## 2018-11-25 NOTE — Progress Notes (Signed)
BP 131/79   Pulse 66   Temp 98.4 F (36.9 C) (Oral)   Ht 5' 2.5" (1.588 m)   Wt 146 lb (66.2 kg)   LMP 05/13/1980   SpO2 97%   BMI 26.28 kg/m    Subjective:    Patient ID: Cassie Brewer, female    DOB: 03-Feb-1929, 85 y.o.   MRN: [id number]  HPI: CARLESHA BOGGIO is a 85 y.o. female presenting on 11/27/2018 for comprehensive medical examination. Current medical complaints include:  HYPERTENSION / HYPERLIPIDEMIA Satisfied with current treatment? yes Duration of hypertension: chronic BP monitoring frequency: not checking BP medication side effects: no Past BP meds: benazepril, HCTZ Duration of hyperlipidemia: chronic Cholesterol medication side effects: no Cholesterol supplements: fish oil Past cholesterol medications: none Medication compliance: excellent compliance Aspirin: no Recent stressors: no Recurrent headaches: no Visual changes: no Palpitations: no Dyspnea: no Chest pain: no Lower extremity edema: no Dizzy/lightheaded: no  HYPOTHYROIDISM Thyroid control status: feels like it might be off Satisfied with current treatment? no Medication side effects: no Medication compliance: excellent compliance Recent dose adjustment:no Fatigue: yes Cold intolerance: yes Heat intolerance: yes Weight gain: no Weight loss: no Constipation: no Diarrhea/loose stools: no Palpitations: no Lower extremity edema: no Anxiety/depressed mood: no  Menopausal Symptoms: no  Depression Screen done today and results listed below:  Depression screen [facility] 2/9 11/27/2018 11/16/2018 11/14/2017 11/03/2016 10/29/2015  Decreased Interest 1 0 0 0 0  Down, Depressed, Hopeless 0 1 0 0 0  PHQ - 2 Score 1 1 0 0 0  Altered sleeping 0 - - - -  Tired, decreased energy 2 - - - -  Change in appetite 0 - - - -  Feeling bad or failure about yourself  0 - - - -  Trouble concentrating 0 - - - -  Moving slowly or fidgety/restless 0 - - - -  Suicidal thoughts 0 - - - -  PHQ-9 Score 3 - - - -  Difficult  doing work/chores Not difficult at all - - - -    Past Medical History:  Past Medical History:  Diagnosis Date  . Glaucoma   . Hyperlipidemia   . Hypertension   . Hypothyroidism 10/02/2014  . Overactive bladder   . Squamous cell skin cancer, face     Surgical History:  Past Surgical History:  Procedure Laterality Date  . ABDOMINAL HYSTERECTOMY    . BREAST SURGERY    . THYROIDECTOMY, PARTIAL Right     Medications:  Current Outpatient Medications on File Prior to Visit  Medication Sig  . ammonium lactate (LAC-HYDRIN) 12 % cream Apply topically as needed for dry skin.  . Calcium Carbonate-Vit D-Min (CALCIUM 600+D PLUS MINERALS) 600-400 MG-UNIT TABS   . fluticasone (FLONASE) 50 MCG/ACT nasal spray SPRAY 2 SPRAYS INTO EACH NOSTRIL EVERY DAY  . hydrochlorothiazide (HYDRODIURIL) 25 MG tablet TAKE 1 TABLET BY MOUTH EVERY DAY  . latanoprost (XALATAN) 0.005 % ophthalmic solution Place 1 drop into both eyes at bedtime.  Marland Kitchen levothyroxine (SYNTHROID, LEVOTHROID) 112 MCG tablet Take 1 tablet (112 mcg total) by mouth daily before breakfast.  . Multiple Vitamins-Minerals (CENTRUM SILVER ULTRA WOMENS PO) Take by mouth.  . Omega-3 Fatty Acids (FISH OIL) 1000 MG CAPS Take by mouth daily.  Marland Kitchen omeprazole (PRILOSEC) 40 MG capsule TAKE ONE PILL ONCE DAILY, 30 MINUTES PRIOR TO EVENING MEAL.  Marland Kitchen timolol (TIMOPTIC) 0.5 % ophthalmic solution   . vitamin C (ASCORBIC ACID) 500 MG tablet Take 500 mg by mouth  daily.   No current facility-administered medications on file prior to visit.     Allergies:  Allergies  Allergen Reactions  . Amoxicillin     Other reaction(s): Other (See Comments) "blood pressure very high - almost passed out"  . Eryc [Erythromycin] Other (See Comments)  . Sulfa Antibiotics Nausea And Vomiting    Nausea with bactrim in 2017  Nausea with bactrim in 2017     Social History:  Social History   Socioeconomic History  . Marital status: Widowed    Spouse name: Not on file  .  Number of children: Not on file  . Years of education: Not on file  . Highest education level: Some college, no degree  Occupational History  . Not on file  Social Needs  . Financial resource strain: Not hard at all  . Food insecurity    Worry: Never true    Inability: Never true  . Transportation needs    Medical: No    Non-medical: No  Tobacco Use  . Smoking status: Never Smoker  . Smokeless tobacco: Never Used  Substance and Sexual Activity  . Alcohol use: No  . Drug use: No  . Sexual activity: Not Currently    Birth control/protection: Surgical  Lifestyle  . Physical activity    Days per week: 2 days    Minutes per session: 10 min  . Stress: Not at all  Relationships  . Social connections    Talks on phone: More than three times a week    Gets together: More than three times a week    Attends religious service: More than 4 times per year    Active member of club or organization: Yes    Attends meetings of clubs or organizations: More than 4 times per year    Relationship status: Widowed  . Intimate partner violence    Fear of current or ex partner: No    Emotionally abused: No    Physically abused: No    Forced sexual activity: No  Other Topics Concern  . Not on file  Social History Narrative  . Not on file   Social History   Tobacco Use  Smoking Status Never Smoker  Smokeless Tobacco Never Used   Social History   Substance and Sexual Activity  Alcohol Use No    Family History:  Family History  Problem Relation Age of Onset  . Hypertension Mother   . Diabetes Father   . Colon cancer Father   . Breast cancer Grandchild     Past medical history, surgical history, medications, allergies, family history and social history reviewed with patient today and changes made to appropriate areas of the chart.   Review of Systems  Constitutional: Positive for malaise/fatigue. Negative for chills, diaphoresis, fever and weight loss.  HENT: Positive for  hearing loss. Negative for congestion, ear discharge, ear pain, nosebleeds, sinus pain, sore throat and tinnitus.   Eyes: Negative.   Respiratory: Negative.  Negative for stridor.   Cardiovascular: Negative.   Gastrointestinal: Positive for abdominal pain and constipation. Negative for blood in stool, diarrhea, heartburn, melena, nausea and vomiting.  Genitourinary: Negative.   Musculoskeletal: Negative.   Skin: Positive for rash. Negative for itching.  Neurological: Negative.   Endo/Heme/Allergies: Positive for environmental allergies. Negative for polydipsia. Does not bruise/bleed easily.  Psychiatric/Behavioral: Negative.     All other ROS negative except what is listed above and in the HPI.      Objective:    BP  131/79   Pulse 66   Temp 98.4 F (36.9 C) (Oral)   Ht 5' 2.5" (1.588 m)   Wt 146 lb (66.2 kg)   LMP 05/13/1980   SpO2 97%   BMI 26.28 kg/m   Wt Readings from Last 3 Encounters:  11/27/18 146 lb (66.2 kg)  05/25/18 146 lb 8 oz (66.5 kg)  12/15/17 146 lb 7 oz (66.4 kg)    Physical Exam Vitals signs and nursing note reviewed.  Constitutional:      General: She is not in acute distress.    Appearance: Normal appearance. She is not ill-appearing, toxic-appearing or diaphoretic.  HENT:     Head: Normocephalic and atraumatic.     Right Ear: Tympanic membrane, ear canal and external ear normal. There is no impacted cerumen.     Left Ear: Tympanic membrane, ear canal and external ear normal. There is no impacted cerumen.     Nose: Nose normal. No congestion or rhinorrhea.     Mouth/Throat:     Mouth: Mucous membranes are moist.     Pharynx: Oropharynx is clear. No oropharyngeal exudate or posterior oropharyngeal erythema.  Eyes:     General: No scleral icterus.       Right eye: No discharge.        Left eye: No discharge.     Extraocular Movements: Extraocular movements intact.     Conjunctiva/sclera: Conjunctivae normal.     Pupils: Pupils are equal, round, and  reactive to light.  Neck:     Musculoskeletal: Normal range of motion and neck supple. No neck rigidity or muscular tenderness.     Vascular: No carotid bruit.  Cardiovascular:     Rate and Rhythm: Normal rate and regular rhythm.     Pulses: Normal pulses.     Heart sounds: No murmur. No friction rub. No gallop.   Pulmonary:     Effort: Pulmonary effort is normal. No respiratory distress.     Breath sounds: Normal breath sounds. No stridor. No wheezing, rhonchi or rales.  Chest:     Chest wall: No tenderness.  Abdominal:     General: Abdomen is flat. Bowel sounds are normal. There is no distension.     Palpations: Abdomen is soft. There is no mass.     Tenderness: There is no abdominal tenderness. There is no right CVA tenderness, left CVA tenderness, guarding or rebound.     Hernia: No hernia is present.  Genitourinary:    Comments: Breast and pelvic exams deferred with shared decision making Musculoskeletal:        General: No swelling, tenderness, deformity or signs of injury.     Right lower leg: No edema.     Left lower leg: No edema.  Lymphadenopathy:     Cervical: No cervical adenopathy.  Skin:    General: Skin is warm and dry.     Capillary Refill: Capillary refill takes less than 2 seconds.     Coloration: Skin is not jaundiced or pale.     Findings: No bruising, erythema, lesion or rash.  Neurological:     General: No focal deficit present.     Mental Status: She is alert and oriented to person, place, and time. Mental status is at baseline.     Cranial Nerves: No cranial nerve deficit.     Sensory: No sensory deficit.     Motor: No weakness.     Coordination: Coordination normal.     Gait: Gait normal.  Deep Tendon Reflexes: Reflexes normal.  Psychiatric:        Mood and Affect: Mood normal.        Behavior: Behavior normal.        Thought Content: Thought content normal.        Judgment: Judgment normal.     Results for orders placed or performed in  visit on 05/25/18  Comprehensive metabolic panel  Result Value Ref Range   Glucose 97 65 - 99 mg/dL   BUN 14 8 - 27 mg/dL   Creatinine, Ser 1.00 0.57 - 1.00 mg/dL   GFR calc non Af Amer 51 (L) >59 mL/min/1.73   GFR calc Af Amer 59 (L) >59 mL/min/1.73   BUN/Creatinine Ratio 14 12 - 28   Sodium 138 134 - 144 mmol/L   Potassium 4.2 3.5 - 5.2 mmol/L   Chloride 101 96 - 106 mmol/L   CO2 24 20 - 29 mmol/L   Calcium 9.7 8.7 - 10.3 mg/dL   Total Protein 7.2 6.0 - 8.5 g/dL   Albumin 4.5 3.6 - 4.6 g/dL   Globulin, Total 2.7 1.5 - 4.5 g/dL   Albumin/Globulin Ratio 1.7 1.2 - 2.2   Bilirubin Total 0.5 0.0 - 1.2 mg/dL   Alkaline Phosphatase 61 39 - 117 IU/L   AST 15 0 - 40 IU/L   ALT 14 0 - 32 IU/L  Lipid Panel w/o Chol/HDL Ratio  Result Value Ref Range   Cholesterol, Total 255 (H) 100 - 199 mg/dL   Triglycerides 92 0 - 149 mg/dL   HDL 75 >39 mg/dL   VLDL Cholesterol Cal 18 5 - 40 mg/dL   LDL Calculated 162 (H) 0 - 99 mg/dL  TSH  Result Value Ref Range   TSH 3.420 0.450 - 4.500 uIU/mL      Assessment & Plan:   Problem List Items Addressed This Visit      Endocrine   Hypothyroidism    Feels like her thyroid is off and swollen- will check labs and will get thyroid US. Await results.       Relevant Orders   TSH     Genitourinary   Benign hypertensive renal disease    Under good control on current regimen. Continue current regimen. Continue to monitor. Call with any concerns. Refills given. Labs drawn today.       Relevant Medications   benazepril (LOTENSIN) 40 MG tablet   Other Relevant Orders   CBC with Differential/Platelet   Comprehensive metabolic panel   UA/M w/rflx Culture, Routine   CKD (chronic kidney disease), stage III ([place])    Labs drawn today. Await results.         Other   Hyperlipidemia    Under good control on current regimen. Continue current regimen. Continue to monitor. Call with any concerns. Refills given. Labs drawn today.       Relevant  Medications   benazepril (LOTENSIN) 40 MG tablet   Other Relevant Orders   Comprehensive metabolic panel   Lipid Panel w/o Chol/HDL Ratio    Other Visit Diagnoses    Routine general medical examination at a health care facility    -  Primary   Vaccines up to date. Screening labs checked today. Mammogram ordered. DEXA UTD. Pap and colonoscopy N/A. Continue diet and exercise. Call with any concerns.    Flu vaccine need       Given today.   Relevant Orders   Flu Vaccine QUAD High Dose(Fluad) (Completed)   Thyromegaly  Feels like her thyroid is off and swollen- will check labs and will get thyroid US. Await results.    Relevant Orders   US Soft Tissue Head/Neck   Essential hypertension       Relevant Medications   benazepril (LOTENSIN) 40 MG tablet       Follow up plan: Return in about 6 months (around 05/27/2019).   LABORATORY TESTING:  - Pap smear: not applicable  IMMUNIZATIONS:   - Tdap: Tetanus vaccination status reviewed: last tetanus booster within 10 years. - Influenza: Administered today - Pneumovax: Up to date - Prevnar: Up to date  SCREENING: -Mammogram: Order in- encouraged her to get it  - Colonoscopy: Not applicable  - Bone Density: Up to date   PATIENT COUNSELING:   Advised to take 1 mg of folate supplement per day if capable of pregnancy.   Sexuality: Discussed sexually transmitted diseases, partner selection, use of condoms, avoidance of unintended pregnancy  and contraceptive alternatives.   Advised to avoid cigarette smoking.  I discussed with the patient that most people either abstain from alcohol or drink within safe limits (<=14/week and <=4 drinks/occasion for males, <=7/weeks and <= 3 drinks/occasion for females) and that the risk for alcohol disorders and other health effects rises proportionally with the number of drinks per week and how often a drinker exceeds daily limits.  Discussed cessation/primary prevention of drug use and  availability of treatment for abuse.   Diet: Encouraged to adjust caloric intake to maintain  or achieve ideal body weight, to reduce intake of dietary saturated fat and total fat, to limit sodium intake by avoiding high sodium foods and not adding table salt, and to maintain adequate dietary potassium and calcium preferably from fresh fruits, vegetables, and low-fat dairy products.    stressed the importance of regular exercise  Injury prevention: Discussed safety belts, safety helmets, smoke detector, smoking near bedding or upholstery.   Dental health: Discussed importance of regular tooth brushing, flossing, and dental visits.    NEXT PREVENTATIVE PHYSICAL DUE IN 1 YEAR. Return in about 6 months (around 05/27/2019).

## 2018-11-27 ENCOUNTER — Ambulatory Visit (INDEPENDENT_AMBULATORY_CARE_PROVIDER_SITE_OTHER): Payer: PPO | Admitting: Family Medicine

## 2018-11-27 ENCOUNTER — Other Ambulatory Visit: Payer: Self-pay

## 2018-11-27 ENCOUNTER — Encounter: Payer: Self-pay | Admitting: Family Medicine

## 2018-11-27 VITALS — BP 131/79 | HR 66 | Temp 98.4°F | Ht 62.5 in | Wt 146.0 lb

## 2018-11-27 DIAGNOSIS — Z0000 Encounter for general adult medical examination without abnormal findings: Secondary | ICD-10-CM | POA: Diagnosis not present

## 2018-11-27 DIAGNOSIS — E039 Hypothyroidism, unspecified: Secondary | ICD-10-CM | POA: Diagnosis not present

## 2018-11-27 DIAGNOSIS — E010 Iodine-deficiency related diffuse (endemic) goiter: Secondary | ICD-10-CM | POA: Diagnosis not present

## 2018-11-27 DIAGNOSIS — E785 Hyperlipidemia, unspecified: Secondary | ICD-10-CM

## 2018-11-27 DIAGNOSIS — I10 Essential (primary) hypertension: Secondary | ICD-10-CM | POA: Diagnosis not present

## 2018-11-27 DIAGNOSIS — I129 Hypertensive chronic kidney disease with stage 1 through stage 4 chronic kidney disease, or unspecified chronic kidney disease: Secondary | ICD-10-CM

## 2018-11-27 DIAGNOSIS — Z23 Encounter for immunization: Secondary | ICD-10-CM | POA: Diagnosis not present

## 2018-11-27 DIAGNOSIS — N183 Chronic kidney disease, stage 3 unspecified: Secondary | ICD-10-CM

## 2018-11-27 LAB — UA/M W/RFLX CULTURE, ROUTINE
Bilirubin, UA: NEGATIVE
Glucose, UA: NEGATIVE
Ketones, UA: NEGATIVE
Leukocytes,UA: NEGATIVE
Nitrite, UA: NEGATIVE
Protein,UA: NEGATIVE
Specific Gravity, UA: 1.015 (ref 1.005–1.030)
Urobilinogen, Ur: 0.2 mg/dL (ref 0.2–1.0)
pH, UA: 7 (ref 5.0–7.5)

## 2018-11-27 LAB — MICROSCOPIC EXAMINATION
RBC, Urine: NONE SEEN /hpf (ref 0–2)
WBC, UA: NONE SEEN /hpf (ref 0–5)

## 2018-11-27 MED ORDER — CLOTRIMAZOLE-BETAMETHASONE 1-0.05 % EX CREA: 1.0000 "application " | TOPICAL_CREAM | Freq: Two times a day (BID) | CUTANEOUS | 3 refills | Status: DC

## 2018-11-27 MED ORDER — BENAZEPRIL HCL 40 MG PO TABS: 40.0000 mg | ORAL_TABLET | Freq: Every day | ORAL | 1 refills | Status: DC

## 2018-11-27 MED ORDER — TRIAMCINOLONE ACETONIDE 0.5 % EX OINT: 1.0000 "application " | TOPICAL_OINTMENT | Freq: Two times a day (BID) | CUTANEOUS | 0 refills | Status: DC

## 2018-11-27 NOTE — Assessment & Plan Note (Signed)
Labs drawn today. Await results.  

## 2018-11-27 NOTE — Assessment & Plan Note (Signed)
Under good control on current regimen. Continue current regimen. Continue to monitor. Call with any concerns. Refills given. Labs drawn today.   

## 2018-11-27 NOTE — Assessment & Plan Note (Signed)
Feels like her thyroid is off and swollen- will check labs and will get thyroid US. Await results.

## 2018-11-28 LAB — COMPREHENSIVE METABOLIC PANEL
ALT: 11 IU/L (ref 0–32)
AST: 14 IU/L (ref 0–40)
Albumin/Globulin Ratio: 1.9 (ref 1.2–2.2)
Albumin: 4.6 g/dL (ref 3.6–4.6)
Alkaline Phosphatase: 52 IU/L (ref 39–117)
BUN/Creatinine Ratio: 16 (ref 12–28)
BUN: 17 mg/dL (ref 8–27)
Bilirubin Total: 0.6 mg/dL (ref 0.0–1.2)
CO2: 24 mmol/L (ref 20–29)
Calcium: 9.7 mg/dL (ref 8.7–10.3)
Chloride: 100 mmol/L (ref 96–106)
Creatinine, Ser: 1.05 mg/dL — ABNORMAL HIGH (ref 0.57–1.00)
GFR calc Af Amer: 55 mL/min/{1.73_m2} — ABNORMAL LOW (ref >59)
GFR calc non Af Amer: 48 mL/min/{1.73_m2} — ABNORMAL LOW (ref >59)
Globulin, Total: 2.4 g/dL (ref 1.5–4.5)
Glucose: 106 mg/dL — ABNORMAL HIGH (ref 65–99)
Potassium: 4.5 mmol/L (ref 3.5–5.2)
Sodium: 140 mmol/L (ref 134–144)
Total Protein: 7 g/dL (ref 6.0–8.5)

## 2018-11-28 LAB — LIPID PANEL W/O CHOL/HDL RATIO
Cholesterol, Total: 260 mg/dL — ABNORMAL HIGH (ref 100–199)
HDL: 69 mg/dL (ref >39)
LDL Chol Calc (NIH): 176 mg/dL — ABNORMAL HIGH (ref 0–99)
Triglycerides: 86 mg/dL (ref 0–149)
VLDL Cholesterol Cal: 15 mg/dL (ref 5–40)

## 2018-11-28 LAB — CBC WITH DIFFERENTIAL/PLATELET
Basophils Absolute: 0.1 10*3/uL (ref 0.0–0.2)
Basos: 1 %
EOS (ABSOLUTE): 0.1 10*3/uL (ref 0.0–0.4)
Eos: 3 %
Hematocrit: 37.3 % (ref 34.0–46.6)
Hemoglobin: 12.5 g/dL (ref 11.1–15.9)
Immature Grans (Abs): 0 10*3/uL (ref 0.0–0.1)
Immature Granulocytes: 0 %
Lymphocytes Absolute: 1.2 10*3/uL (ref 0.7–3.1)
Lymphs: 24 %
MCH: 29.6 pg (ref 26.6–33.0)
MCHC: 33.5 g/dL (ref 31.5–35.7)
MCV: 88 fL (ref 79–97)
Monocytes Absolute: 0.6 10*3/uL (ref 0.1–0.9)
Monocytes: 12 %
Neutrophils Absolute: 3 10*3/uL (ref 1.4–7.0)
Neutrophils: 60 %
Platelets: 335 10*3/uL (ref 150–450)
RBC: 4.22 x10E6/uL (ref 3.77–5.28)
RDW: 13.5 % (ref 11.7–15.4)
WBC: 4.9 10*3/uL (ref 3.4–10.8)

## 2018-11-28 LAB — TSH: TSH: 1.56 u[IU]/mL (ref 0.450–4.500)

## 2018-12-05 ENCOUNTER — Ambulatory Visit
Admission: RE | Admit: 2018-12-05 | Discharge: 2018-12-05 | Disposition: A | Discharge status: Home / Self Care | Payer: PPO | Source: Ambulatory Visit | Attending: Family Medicine | Admitting: Family Medicine

## 2018-12-05 ENCOUNTER — Other Ambulatory Visit: Payer: Self-pay | Admitting: Family Medicine

## 2018-12-05 ENCOUNTER — Other Ambulatory Visit: Payer: Self-pay

## 2018-12-05 DIAGNOSIS — E010 Iodine-deficiency related diffuse (endemic) goiter: Secondary | ICD-10-CM | POA: Diagnosis not present

## 2018-12-05 DIAGNOSIS — E041 Nontoxic single thyroid nodule: Secondary | ICD-10-CM | POA: Diagnosis not present

## 2018-12-06 ENCOUNTER — Encounter: Payer: Self-pay | Admitting: Family Medicine

## 2018-12-06 DIAGNOSIS — H401131 Primary open-angle glaucoma, bilateral, mild stage: Secondary | ICD-10-CM | POA: Diagnosis not present

## 2018-12-06 DIAGNOSIS — H353131 Nonexudative age-related macular degeneration, bilateral, early dry stage: Secondary | ICD-10-CM | POA: Diagnosis not present

## 2018-12-15 ENCOUNTER — Telehealth: Payer: Self-pay | Admitting: Family Medicine

## 2018-12-15 DIAGNOSIS — R490 Dysphonia: Secondary | ICD-10-CM

## 2018-12-15 DIAGNOSIS — R6889 Other general symptoms and signs: Secondary | ICD-10-CM

## 2018-12-15 DIAGNOSIS — R0989 Other specified symptoms and signs involving the circulatory and respiratory systems: Secondary | ICD-10-CM

## 2018-12-15 NOTE — Telephone Encounter (Signed)
See below

## 2018-12-15 NOTE — Telephone Encounter (Signed)
Please let her know that what's left of her thyroid looks nice and normal. It does not seem large and there are no nodules in it, so we'll just keep an eye on it. Thanks!

## 2018-12-15 NOTE — Telephone Encounter (Signed)
Patient states that her tsh needs to be in the 3 range, when checked in February it was over 3 and now its at 1 something and she is  unsure of why her neck is swollen and tight at times.  Please contact patient to discuss this.

## 2018-12-15 NOTE — Telephone Encounter (Signed)
Patient notified

## 2018-12-15 NOTE — Telephone Encounter (Signed)
Copied from [place] [id number]. Topic: General - Other >> Dec 14, 2018  3:39 PM Leward Quan A wrote: Reason for CRM: Patient request a call back from [facility] with the results of the Thyroid scan that she had about a week ago. Can be reached at Ph#  [phone]

## 2018-12-25 NOTE — Telephone Encounter (Signed)
Called patient and discussed that her thyroid US was normal. She continues with some tightness in her throat and hoarseness. Saw ENT last year who did not not see any cause for this. Will get her over to see GI to see what's going on. Referral generated today.

## 2018-12-29 DIAGNOSIS — L400 Psoriasis vulgaris: Secondary | ICD-10-CM | POA: Diagnosis not present

## 2018-12-29 DIAGNOSIS — B353 Tinea pedis: Secondary | ICD-10-CM | POA: Diagnosis not present

## 2019-02-07 ENCOUNTER — Other Ambulatory Visit: Payer: Self-pay

## 2019-02-07 ENCOUNTER — Ambulatory Visit: Payer: PPO | Admitting: Gastroenterology

## 2019-02-07 ENCOUNTER — Encounter (INDEPENDENT_AMBULATORY_CARE_PROVIDER_SITE_OTHER): Payer: Self-pay

## 2019-02-07 VITALS — BP 154/77 | HR 71 | Temp 98.2°F | Ht 62.5 in | Wt 151.6 lb

## 2019-02-07 DIAGNOSIS — R490 Dysphonia: Secondary | ICD-10-CM | POA: Diagnosis not present

## 2019-02-07 DIAGNOSIS — R221 Localized swelling, mass and lump, neck: Secondary | ICD-10-CM | POA: Diagnosis not present

## 2019-02-07 MED ORDER — FAMOTIDINE 40 MG PO TABS: 40.0000 mg | ORAL_TABLET | Freq: Every day | ORAL | 1 refills | Status: DC

## 2019-02-07 NOTE — Addendum Note (Signed)
Addended by: Dorethea Clan on: 02/07/2019 09:58 AM   Modules accepted: Orders

## 2019-02-07 NOTE — Progress Notes (Signed)
Jonathon Bellows MD, MRCP(U.K) [address]  [place]  [place], [place] [address]  Main: [phone]  Fax: [phone]   Gastroenterology Consultation  Referring Provider:     Valerie Roys, DO Primary Care Physician:  Valerie Roys, DO Primary Gastroenterologist:  Dr. Jonathon Bellows  Reason for Consultation:     Hoarseness and throat tightness.        HPI:   SHADEJA REEL is a 85 y.o. y/o female referred for consultation & management  by Dr. Wynetta Emery, Megan P, DO.    She states that she is here to see me for hoarseness of her voice, some dysphagia, and a fullness in the left side of her neck.  She states that she had her right thyroid resected many years back for Hashimoto's disease.  And she has had hoarseness and some issues with swallowing solids and liquids worse for cold liquids for a long time for years which has not progressed.  He has been prescribed PPIs in the past but has not taken on a regular basis.  Denies any heartburn, weight loss.  Her main concern has been a fullness on the left side of her neck which she underwent a ultrasound recently on 12/05/2018 which was normal.  She recalls that she has had laryngoscopy by Dr. Richardson Landry for the hoarseness in the past and it was normal.  Past Medical History:  Diagnosis Date  . Glaucoma   . Hyperlipidemia   . Hypertension   . Hypothyroidism 10/02/2014  . Overactive bladder   . Squamous cell skin cancer, face     Past Surgical History:  Procedure Laterality Date  . ABDOMINAL HYSTERECTOMY    . BREAST SURGERY    . THYROIDECTOMY, PARTIAL Right     Prior to Admission medications   Medication Sig Start Date End Date Taking? Authorizing Provider  ammonium lactate (LAC-HYDRIN) 12 % cream Apply topically as needed for dry skin. 03/02/18   Gardiner Barefoot, DPM  benazepril (LOTENSIN) 40 MG tablet Take 1 tablet (40 mg total) by mouth daily. 11/27/18   Johnson, Megan P, DO  Calcium Carbonate-Vit D-Min (CALCIUM 600+D PLUS  MINERALS) 600-400 MG-UNIT TABS  10/02/14   [provider]  clotrimazole-betamethasone (LOTRISONE) cream Apply 1 application topically 2 (two) times daily. 11/27/18   Johnson, Megan P, DO  fluticasone (FLONASE) 50 MCG/ACT nasal spray SPRAY 2 SPRAYS INTO EACH NOSTRIL EVERY DAY 12/02/17   [provider]  hydrochlorothiazide (HYDRODIURIL) 25 MG tablet TAKE 1 TABLET BY MOUTH EVERY DAY 11/13/18   Johnson, Megan P, DO  latanoprost (XALATAN) 0.005 % ophthalmic solution Place 1 drop into both eyes at bedtime. 03/11/15   [provider]  levothyroxine (SYNTHROID, LEVOTHROID) 112 MCG tablet Take 1 tablet (112 mcg total) by mouth daily before breakfast. 05/25/18   Park Liter P, DO  Multiple Vitamins-Minerals (CENTRUM SILVER ULTRA WOMENS PO) Take by mouth.    [provider]  Omega-3 Fatty Acids (FISH OIL) 1000 MG CAPS Take by mouth daily.    [provider]  omeprazole (PRILOSEC) 40 MG capsule TAKE ONE PILL ONCE DAILY, 30 MINUTES PRIOR TO EVENING MEAL. 12/02/17   [provider]  timolol (TIMOPTIC) 0.5 % ophthalmic solution  04/28/16   [provider]  triamcinolone ointment (KENALOG) 0.5 % Apply 1 application topically 2 (two) times daily. 11/27/18   Johnson, Megan P, DO  vitamin C (ASCORBIC ACID) 500 MG tablet Take 500 mg by mouth daily.    [provider]  Family History  Problem Relation Age of Onset  . Hypertension Mother   . Diabetes Father   . Colon cancer Father   . Breast cancer Grandchild      Social History   Tobacco Use  . Smoking status: Never Smoker  . Smokeless tobacco: Never Used  Substance Use Topics  . Alcohol use: No  . Drug use: No    Allergies as of 02/07/2019 - Review Complete 11/27/2018  Allergen Reaction Noted  . Amoxicillin  09/10/2014  . Eryc [erythromycin] Other (See Comments) 10/02/2014  . Sulfa antibiotics Nausea And Vomiting 11/14/2017    Review of Systems:    All systems reviewed and  negative except where noted in HPI.   Physical Exam:  LMP 05/13/1980  Patient's last menstrual period was 05/13/1980. Psych:  Alert and cooperative. Normal mood and affect. General:   Alert,  Well-developed, well-nourished, pleasant and cooperative in NAD Head:  Normocephalic and atraumatic. Eyes:  Sclera clear, no icterus.   Conjunctiva pink. Ears:  Normal auditory acuity. Nose:  No deformity, discharge, or lesions. Mouth:  No deformity or lesions,oropharynx pink & moist. Neck: On inspection there is a fullness on the left side just medial to the insertion of the sternocleidomastoid particularly medial to the insertion over the clavicle.  No mass can be felt.  The skin is lax over this area but there still seems to be a fullness underneath the skin.  No tenderness.  No lymphadenopathy. Lungs:  Respirations even and unlabored.  Clear throughout to auscultation.   No wheezes, crackles, or rhonchi. No acute distress. Heart:  Regular rate and rhythm; no murmurs, clicks, rubs, or gallops. Abdomen:  Normal bowel sounds.  No bruits.  Soft, non-tender and non-distended without masses, hepatosplenomegaly or hernias noted.  No guarding or rebound tenderness.    Neurologic:  Alert and oriented x3;  grossly normal neurologically. Skin:  Intact without significant lesions or rashes. No jaundice. Lymph Nodes:  No significant cervical adenopathy. Psych:  Alert and cooperative. Normal mood and affect.  Imaging Studies: No results found.  Assessment and Plan:   THORA ASLAM is a 85 y.o. y/o female has been referred for hoarseness of the voice and some element of dysphagia ongoing for years.  She has never been given a trial of PPI.  She is concerned about the side effects of PPI.  Hence has not taken it in the past.  Her main concern is a sense of fullness in the left side of her neck which is visible on inspection but no underlying masses seen on palpation.  Plan 1.  CT scan of the neck to rule out  any abnormalities in the left side contributing to the visibility fullness. 2.  Trial of Pepcid taken once daily at night.  Follow-up in 4 weeks telephone visit to discuss further.  If issues still persist will proceed with endoscopy.  Follow up in 4 weeks  Dr Jonathon Bellows MD,MRCP(U.K)

## 2019-02-07 NOTE — Addendum Note (Signed)
Addended by: Dorethea Clan on: 02/07/2019 09:43 AM   Modules accepted: Orders

## 2019-02-16 ENCOUNTER — Ambulatory Visit
Admission: RE | Admit: 2019-02-16 | Discharge: 2019-02-16 | Disposition: A | Discharge status: Home / Self Care | Payer: PPO | Source: Ambulatory Visit | Attending: Gastroenterology | Admitting: Gastroenterology

## 2019-02-16 ENCOUNTER — Other Ambulatory Visit: Payer: Self-pay

## 2019-02-16 DIAGNOSIS — R221 Localized swelling, mass and lump, neck: Secondary | ICD-10-CM | POA: Diagnosis not present

## 2019-02-16 LAB — POCT I-STAT CREATININE: Creatinine, Ser: 1 mg/dL (ref 0.44–1.00)

## 2019-02-16 MED ORDER — IOHEXOL 300 MG/ML  SOLN
75.0000 mL | Freq: Once | INTRAMUSCULAR | Status: AC | PRN
  Administered 2019-02-16: 09:00:00 75 mL via INTRAVENOUS

## 2019-02-19 ENCOUNTER — Encounter: Payer: Self-pay | Admitting: Gastroenterology

## 2019-02-21 ENCOUNTER — Telehealth: Payer: Self-pay

## 2019-02-21 NOTE — Telephone Encounter (Signed)
-----   Message from Jonathon Bellows, MD sent at 02/19/2019 12:11 PM EST ----- Sherald Hess inform no significant mass seen on the CT scan except a mildly enlarged thyroid gland  C.c Valerie Roys, DO   Dr Jonathon Bellows MD,MRCP [facility]) Gastroenterology/Hepatology Pager: [phone]

## 2019-02-21 NOTE — Telephone Encounter (Signed)
Informed pt of CT scan result. Pt plans to contact her pcp to follow up.

## 2019-03-01 ENCOUNTER — Telehealth: Payer: Self-pay

## 2019-03-01 DIAGNOSIS — T458X5A Adverse effect of other primarily systemic and hematological agents, initial encounter: Secondary | ICD-10-CM

## 2019-03-01 DIAGNOSIS — M810 Age-related osteoporosis without current pathological fracture: Secondary | ICD-10-CM

## 2019-03-01 NOTE — Telephone Encounter (Signed)
Called patient, no answer, left a message asking patient to return my call.

## 2019-03-01 NOTE — Telephone Encounter (Signed)
Copied from [place] [phone]. Topic: General - Other >> Feb 27, 2019  2:45 PM Rainey Pines A wrote: Patient would like a callback from [employer] in regards to some automated voicemails she has received about medications should have filled that she isnt familiar with.

## 2019-03-01 NOTE — Telephone Encounter (Signed)
I've never spoken with this patient before, uncertain how she got my name.   Could someone call and confirm that she needs to speak with me, and how she got my name and further clarify her need? Thanks!

## 2019-03-02 NOTE — Addendum Note (Signed)
Addended by: Valerie Roys on: 03/02/2019 04:59 PM   Modules accepted: Orders

## 2019-03-02 NOTE — Telephone Encounter (Signed)
She had the thyroid US done in September. It showed the same thing- nothing to worry about. No nodules. Nothing needs to be done about this right now, unless she would like to see endocrine

## 2019-03-02 NOTE — Telephone Encounter (Signed)
Called patient, no answer, left a message asking patient to return my call.

## 2019-03-02 NOTE — Telephone Encounter (Signed)
Patient is not on these medications.

## 2019-03-02 NOTE — Telephone Encounter (Signed)
Pt is returning tiffany call

## 2019-03-02 NOTE — Telephone Encounter (Signed)
Called patient back and let her know what Dr. Wynetta Emery said. Patient would like to go ahead and see endocrinology. Wants to see someone in the [facility] network.

## 2019-03-02 NOTE — Telephone Encounter (Signed)
Patient states that she had a CT scan through GI and that it showed thickening around thyroid gland and she would like for you to take a look at the report and let her know what she needs to do next.

## 2019-03-07 DIAGNOSIS — H401131 Primary open-angle glaucoma, bilateral, mild stage: Secondary | ICD-10-CM | POA: Diagnosis not present

## 2019-03-07 DIAGNOSIS — H26493 Other secondary cataract, bilateral: Secondary | ICD-10-CM | POA: Diagnosis not present

## 2019-03-07 DIAGNOSIS — H353131 Nonexudative age-related macular degeneration, bilateral, early dry stage: Secondary | ICD-10-CM | POA: Diagnosis not present

## 2019-03-26 ENCOUNTER — Ambulatory Visit (INDEPENDENT_AMBULATORY_CARE_PROVIDER_SITE_OTHER): Payer: PPO | Admitting: Gastroenterology

## 2019-03-26 ENCOUNTER — Other Ambulatory Visit: Payer: Self-pay

## 2019-03-26 DIAGNOSIS — R1310 Dysphagia, unspecified: Secondary | ICD-10-CM

## 2019-03-26 DIAGNOSIS — R490 Dysphonia: Secondary | ICD-10-CM

## 2019-03-26 DIAGNOSIS — R221 Localized swelling, mass and lump, neck: Secondary | ICD-10-CM

## 2019-03-26 NOTE — Progress Notes (Signed)
Jonathon Bellows , MD [address]  [place]  [place], [place] [address]  Main: [phone]  Fax: [phone]   Primary Care Physician: Valerie Roys, DO  Virtual Visit via Telephone Note  I connected with patient on 03/26/19 at  3:00 PM EST by telephone and verified that I am speaking with the correct person using two identifiers.   I discussed the limitations, risks, security and privacy concerns of performing an evaluation and management service by telephone and the availability of in person appointments. I also discussed with the patient that there may be a patient responsible charge related to this service. The patient expressed understanding and agreed to proceed.  Location of Patient: Home Location of Provider: Home Persons involved: Patient and provider only   History of Present Illness:  Follow-up for hoarseness of voice and dysphagia  HPI: Cassie Brewer is a 85 y.o. female    Summary of history :  She was initially referred and seen on 02/07/2019 for hoarseness of voice and dysphagia.  Also had some fullness in the left side of her neck.  She has had a right thyroid resected for Hashimoto's disease many years back.  Hoarseness and issues with swallowing for solids and liquids for many years.  Not progressed.  Liquids worse ,particularly if cold.  Denies any heartburn, weight loss.  Interval history   02/07/2019-03/26/2019  02/16/2019: CT scan of the neck with contrast: Mildly enlarged left thyroid lobe but no other neck mass or adenopathy. Still has a bit of hoarseness in her voice.  Usually in the mornings.  She takes Pepcid daily once a day.  She has seen ENT in the past.  She has been referred to endocrinology and is awaiting an appointment.  She will be contacting Dr. Durenda Age office to schedule it soon.  She still has a bit of dysphagia which has not resolved completely.  Current Outpatient Medications  Medication Sig Dispense Refill  . ammonium  lactate (LAC-HYDRIN) 12 % cream Apply topically as needed for dry skin. 385 g 0  . benazepril (LOTENSIN) 40 MG tablet Take 1 tablet (40 mg total) by mouth daily. 90 tablet 1  . Calcium Carbonate-Vit D-Min (CALCIUM 600+D PLUS MINERALS) 600-400 MG-UNIT TABS     . clotrimazole-betamethasone (LOTRISONE) cream Apply 1 application topically 2 (two) times daily. 45 g 3  . famotidine (PEPCID) 40 MG tablet Take 1 tablet (40 mg total) by mouth at bedtime. 90 tablet 1  . fluticasone (FLONASE) 50 MCG/ACT nasal spray SPRAY 2 SPRAYS INTO EACH NOSTRIL EVERY DAY  12  . hydrochlorothiazide (HYDRODIURIL) 25 MG tablet TAKE 1 TABLET BY MOUTH EVERY DAY 90 tablet 1  . latanoprost (XALATAN) 0.005 % ophthalmic solution Place 1 drop into both eyes at bedtime.  6  . levothyroxine (SYNTHROID, LEVOTHROID) 112 MCG tablet Take 1 tablet (112 mcg total) by mouth daily before breakfast. 90 tablet 1  . Multiple Vitamins-Minerals (CENTRUM SILVER ULTRA WOMENS PO) Take by mouth.    . Omega-3 Fatty Acids (FISH OIL) 1000 MG CAPS Take by mouth daily.    Marland Kitchen omeprazole (PRILOSEC) 40 MG capsule TAKE ONE PILL ONCE DAILY, 30 MINUTES PRIOR TO EVENING MEAL.  12  . timolol (TIMOPTIC) 0.5 % ophthalmic solution     . triamcinolone ointment (KENALOG) 0.5 % Apply 1 application topically 2 (two) times daily. 30 g 0  . vitamin C (ASCORBIC ACID) 500 MG tablet Take 500 mg by mouth daily.     No current  facility-administered medications for this visit.    Allergies as of 03/26/2019 - Review Complete 02/16/2019  Allergen Reaction Noted  . Amoxicillin  09/10/2014  . Eryc [erythromycin] Other (See Comments) 10/02/2014  . Sulfa antibiotics Nausea And Vomiting 11/14/2017    Review of Systems:    All systems reviewed and negative except where noted in HPI.   Observations/Objective:  Labs: CMP     Component Value Date/Time   NA 140 11/27/2018 0945   NA 132 (L) 02/08/2013 1642   K 4.5 11/27/2018 0945   K 3.3 (L) 02/08/2013 1642   CL 100  11/27/2018 0945   CL 98 02/08/2013 1642   CO2 24 11/27/2018 0945   CO2 27 02/08/2013 1642   GLUCOSE 106 (H) 11/27/2018 0945   GLUCOSE 129 (H) 02/08/2013 1642   BUN 17 11/27/2018 0945   BUN 15 02/08/2013 1642   CREATININE 1.00 02/16/2019 0843   CREATININE 0.84 02/08/2013 1642   CALCIUM 9.7 11/27/2018 0945   CALCIUM 9.4 02/08/2013 1642   PROT 7.0 11/27/2018 0945   PROT 7.5 02/08/2013 1642   ALBUMIN 4.6 11/27/2018 0945   ALBUMIN 3.7 02/08/2013 1642   AST 14 11/27/2018 0945   AST 25 05/05/2016 0926   AST 22 02/08/2013 1642   ALT 11 11/27/2018 0945   ALT 15 05/05/2016 0926   ALT 20 02/08/2013 1642   ALKPHOS 52 11/27/2018 0945   ALKPHOS 75 02/08/2013 1642   BILITOT 0.6 11/27/2018 0945   BILITOT 0.7 02/08/2013 1642   GFRNONAA 48 (L) 11/27/2018 0945   GFRNONAA >60 02/08/2013 1642   GFRAA 55 (L) 11/27/2018 0945   GFRAA >60 02/08/2013 1642   Lab Results  Component Value Date   WBC 4.9 11/27/2018   HGB 12.5 11/27/2018   HCT 37.3 11/27/2018   MCV 88 11/27/2018   PLT 335 11/27/2018    Imaging Studies: No results found.  Assessment and Plan:   Cassie Brewer is a 85 y.o. y/o female here to follow-up for hoarseness of the voice and some element of dysphagia ongoing for years.  She has never been given a trial of PPI.  She is concerned about the side effects of PPI.  Hence has not taken it in the past.  Her main concern is a sense of fullness in the left side of her neck which is visible on inspection but no underlying masses seen on palpation.  CT scan of the neck showed no significantly abnormal lesion.  Slightly enlarged thyroid gland for which she is being referred to endocrinology.  Still has a bit of dysphagia that persist.  Plan 1. Pepcid taken once daily at night.  2.  EGD.  I have discussed alternative options, risks & benefits,  which include, but are not limited to, bleeding, infection, perforation,respiratory complication & drug reaction.  The patient agrees with  this plan & written consent will be obtained.      I discussed the assessment and treatment plan with the patient. The patient was provided an opportunity to ask questions and all were answered. The patient agreed with the plan and demonstrated an understanding of the instructions.   The patient was advised to call back or seek an in-person evaluation if the symptoms worsen or if the condition fails to improve as anticipated.  I provided 15 minutes of non-face-to-face time during this encounter.  Dr Jonathon Bellows MD,MRCP [facility]) Gastroenterology/Hepatology Pager: [phone]   Speech recognition software was used to dictate this note.

## 2019-03-28 ENCOUNTER — Telehealth: Payer: Self-pay | Admitting: Gastroenterology

## 2019-03-28 NOTE — Telephone Encounter (Signed)
Patient called & l/m on v/m she wanted  to speak with Jadijah to cancel her endoscopy.

## 2019-03-30 DIAGNOSIS — I639 Cerebral infarction, unspecified: Secondary | ICD-10-CM

## 2019-03-30 HISTORY — DX: Cerebral infarction, unspecified: I63.9

## 2019-04-02 NOTE — Telephone Encounter (Signed)
Spoke with pt regarding her request to cancel her procedure. Pt states she'd like to postpone her procedure due to the rising COVID cases and also her neighbor who planned to accompany during the procedure has just had surgery and needs time to recover. Pt has also requested to cancel her follow up visit. Pt plans to contact our office when she's ready to reschedule her procedure.

## 2019-04-10 ENCOUNTER — Ambulatory Visit: Admit: 2019-04-10 | Payer: PPO | Admitting: Gastroenterology

## 2019-04-10 SURGERY — ESOPHAGOGASTRODUODENOSCOPY (EGD) WITH PROPOFOL
Anesthesia: General

## 2019-04-12 ENCOUNTER — Encounter (INDEPENDENT_AMBULATORY_CARE_PROVIDER_SITE_OTHER): Payer: Self-pay

## 2019-04-12 ENCOUNTER — Other Ambulatory Visit: Payer: Self-pay

## 2019-04-12 ENCOUNTER — Ambulatory Visit: Payer: PPO | Admitting: Podiatry

## 2019-04-12 ENCOUNTER — Encounter: Payer: Self-pay | Admitting: Podiatry

## 2019-04-12 DIAGNOSIS — L853 Xerosis cutis: Secondary | ICD-10-CM | POA: Diagnosis not present

## 2019-04-12 DIAGNOSIS — M79674 Pain in right toe(s): Secondary | ICD-10-CM

## 2019-04-12 DIAGNOSIS — M79675 Pain in left toe(s): Secondary | ICD-10-CM

## 2019-04-12 DIAGNOSIS — B351 Tinea unguium: Secondary | ICD-10-CM | POA: Diagnosis not present

## 2019-04-12 DIAGNOSIS — L409 Psoriasis, unspecified: Secondary | ICD-10-CM

## 2019-04-13 ENCOUNTER — Encounter: Payer: Self-pay | Admitting: Podiatry

## 2019-04-13 NOTE — Progress Notes (Signed)
  Subjective:  Patient ID: Cassie Brewer, female    DOB: 12-01-1928,  MRN: [id number]  Chief Complaint  Patient presents with  . Nail Problem  . Tinea Pedis   86 y.o. female returns for the above complaint.  Patient presents with thickened elongated mycotic toenails x10.  Patient is especially concerned for right hallux onychomycosis.  Patient states that this has been going on for years and has progressively gotten worse.  She states it hurts sometimes.  She also has a secondary complaint of bilateral plantar feet xerosis/dryness.  Patient had recently seen dermatologist who had given her clotrimazole cream.  Patient states that she is doing well with this cream.  She denies any other acute complaints.  Objective:  There were no vitals filed for this visit. Podiatric Exam: Vascular: dorsalis pedis and posterior tibial pulses are palpable bilateral. Capillary return is immediate. Temperature gradient is WNL. Skin turgor WNL  Sensorium: Normal Semmes Weinstein monofilament test. Normal tactile sensation bilaterally. Nail Exam: Pt has thick disfigured discolored nails with subungual debris noted bilateral entire nail hallux through fifth toenails Ulcer Exam: There is no evidence of ulcer or pre-ulcerative changes or infection. Orthopedic Exam: Muscle tone and strength are WNL. No limitations in general ROM. No crepitus or effusions noted. HAV  B/L.  Hammer toes 2-5  B/L. Skin: No Porokeratosis. No infection or ulcers.  Xerosis/psoriasis of the plantar foot.  There is mild cracking noted.  No bleeding noted.  Assessment & Plan:  Patient was evaluated and treated and all questions answered.  Xerosis/psoriasis -I explained to the patient the etiology of dryness with cracking as well as various treatment options associated with it.  Given that she is doing good with clotrimazole cream that was given to her by dermatologist and had diagnosed her with psoriasis, I believe she can continue using the  cream.  Patient agrees with this plan.  Onychomycosis with pain  -Nails palliatively debrided as below. -Educated on self-care  Procedure: Nail Debridement Rationale: pain  Type of Debridement: manual, sharp debridement. Instrumentation: Nail nipper, rotary burr. Number of Nails: 10  Procedures and Treatment: Consent by patient was obtained for treatment procedures. The patient understood the discussion of treatment and procedures well. All questions were answered thoroughly reviewed. Debridement of mycotic and hypertrophic toenails, 1 through 5 bilateral and clearing of subungual debris. No ulceration, no infection noted.  Return Visit-Office Procedure: Patient instructed to return to the office for a follow up visit 3 months for continued evaluation and treatment.  Boneta Lucks, DPM    No follow-ups on file.

## 2019-04-27 ENCOUNTER — Telehealth: Payer: Self-pay | Admitting: Family Medicine

## 2019-04-27 NOTE — Chronic Care Management (AMB) (Signed)
  Chronic Care Management   Note  04/27/2019 Name: Cassie Brewer MRN: [id number] DOB: 03-05-1929  Cassie Brewer is a 86 y.o. year old female who is a primary care patient of Valerie Roys, DO. I reached out to Erling Cruz by phone today in response to a referral sent by Ms. Tonye Pearson Cortinas's health plan.     Ms. Waldvogel was given information about Chronic Care Management services today including:  1. CCM service includes personalized support from designated clinical staff supervised by her physician, including individualized plan of care and coordination with other care providers 2. 24/7 contact phone numbers for assistance for urgent and routine care needs. 3. Service will only be billed when office clinical staff spend 20 minutes or more in a month to coordinate care. 4. Only one practitioner may furnish and bill the service in a calendar month. 5. The patient may stop CCM services at any time (effective at the end of the month) by phone call to the office staff. 6. The patient will be responsible for cost sharing (co-pay) of up to 20% of the service fee (after annual deductible is met).  Patient agreed to services and verbal consent obtained.   Follow up plan: Telephone appointment with care management team member scheduled for:05/25/2019  Noreene Larsson, [place], [place], [place] [id number] Direct Dial: [phone] [email or web address]: [email or web address]

## 2019-05-07 ENCOUNTER — Ambulatory Visit: Payer: PPO | Admitting: Gastroenterology

## 2019-05-09 ENCOUNTER — Ambulatory Visit: Payer: PPO | Admitting: Podiatry

## 2019-05-16 DIAGNOSIS — L400 Psoriasis vulgaris: Secondary | ICD-10-CM | POA: Diagnosis not present

## 2019-05-16 DIAGNOSIS — L814 Other melanin hyperpigmentation: Secondary | ICD-10-CM | POA: Diagnosis not present

## 2019-05-16 DIAGNOSIS — Z85828 Personal history of other malignant neoplasm of skin: Secondary | ICD-10-CM | POA: Diagnosis not present

## 2019-05-16 DIAGNOSIS — L858 Other specified epidermal thickening: Secondary | ICD-10-CM | POA: Diagnosis not present

## 2019-05-25 ENCOUNTER — Ambulatory Visit (INDEPENDENT_AMBULATORY_CARE_PROVIDER_SITE_OTHER): Payer: PPO | Admitting: General Practice

## 2019-05-25 ENCOUNTER — Telehealth: Payer: PPO | Admitting: General Practice

## 2019-05-25 DIAGNOSIS — N183 Chronic kidney disease, stage 3 unspecified: Secondary | ICD-10-CM

## 2019-05-25 DIAGNOSIS — E785 Hyperlipidemia, unspecified: Secondary | ICD-10-CM

## 2019-05-25 DIAGNOSIS — I129 Hypertensive chronic kidney disease with stage 1 through stage 4 chronic kidney disease, or unspecified chronic kidney disease: Secondary | ICD-10-CM | POA: Diagnosis not present

## 2019-05-25 NOTE — Chronic Care Management (AMB) (Signed)
Chronic Care Management   Initial Visit Note  05/25/2019 Name: Cassie Brewer MRN: [id number] DOB: 03/04/1929  Referred by: Valerie Roys, DO Reason for referral : Chronic Care Management (Initial: HTN/HLD/CKD3)   Cassie Brewer is a 86 y.o. year old female who is a primary care patient of Valerie Roys, DO. The CCM team was consulted for assistance with chronic disease management and care coordination needs related to HTN, HLD and CKD Stage 3  Review of patient status, including review of consultants reports, relevant laboratory and other test results, and collaboration with appropriate care team members and the patient's provider was performed as part of comprehensive patient evaluation and provision of chronic care management services.    SDOH (Social Determinants of Health) assessments performed: Yes- no needs identified at this time See Care Plan activities for detailed interventions related to [facility])     Medications: Outpatient Encounter Medications as of 05/25/2019  Medication Sig Note   ammonium lactate (LAC-HYDRIN) 12 % cream Apply topically as needed for dry skin.    benazepril (LOTENSIN) 40 MG tablet Take 1 tablet (40 mg total) by mouth daily.    Calcium Carbonate-Vit D-Min (CALCIUM 600+D PLUS MINERALS) 600-400 MG-UNIT TABS  05/22/2015: Received from: [facility] (LOTRISONE) cream Apply 1 application topically 2 (two) times daily.    famotidine (PEPCID) 40 MG tablet Take 1 tablet (40 mg total) by mouth at bedtime.    fluticasone (FLONASE) 50 MCG/ACT nasal spray SPRAY 2 SPRAYS INTO EACH NOSTRIL EVERY DAY    hydrochlorothiazide (HYDRODIURIL) 25 MG tablet TAKE 1 TABLET BY MOUTH EVERY DAY    latanoprost (XALATAN) 0.005 % ophthalmic solution Place 1 drop into both eyes at bedtime. 04/24/2015: Received from: External Pharmacy   levothyroxine (SYNTHROID, LEVOTHROID) 112 MCG tablet Take 1 tablet (112 mcg total) by mouth daily  before breakfast.    Multiple Vitamins-Minerals (CENTRUM SILVER ULTRA WOMENS PO) Take by mouth.    Omega-3 Fatty Acids (FISH OIL) 1000 MG CAPS Take by mouth daily.    omeprazole (PRILOSEC) 40 MG capsule TAKE ONE PILL ONCE DAILY, 30 MINUTES PRIOR TO EVENING MEAL. 11/16/2018: As needed    timolol (TIMOPTIC) 0.5 % ophthalmic solution     triamcinolone ointment (KENALOG) 0.5 % Apply 1 application topically 2 (two) times daily.    vitamin C (ASCORBIC ACID) 500 MG tablet Take 500 mg by mouth daily.    No facility-administered encounter medications on file as of 05/25/2019.     Objective:  BP Readings from Last 3 Encounters:  02/07/19 (!) 154/77  11/27/18 131/79  05/25/18 130/64    Goals Addressed            This Visit's Progress    RNCM: I take my blood pressure sometimes       CARE PLAN ENTRY (see longtitudinal plan of care for additional care plan information)  Current Barriers:   Chronic Disease Management support, education, and care coordination needs related to HTN, HLD, and CKD Stage 3  Clinical Goal(s) related to HTN, HLD, and CKD Stage 3 :  Over the next 90days, patient will:   Work with the care management team to address educational, disease management, and care coordination needs   Begin or continue self health monitoring activities as directed today Measure and record blood pressure 3 times per week  Call provider office for new or worsened signs and symptoms Blood pressure findings outside established parameters and New or worsened symptom related to  HLD/CKD3 and new concerns with back pain at times and left neck swelling  Call care management team with questions or concerns  Verbalize basic understanding of patient centered plan of care established today  Interventions related to HTN, HLD, and CKD Stage 3 :   Evaluation of current treatment plans and patient's adherence to plan as established by provider  Assessed patient understanding of disease  states  Assessed patient's education and care coordination needs  Provided disease specific education to patient   Collaborated with appropriate clinical care team members regarding patient needs  Instructed the patient to write down concerns and questions she has to talk to pcp about at her appointment on 05-29-2019.  The patient wants to talk about her lower back pain at times and the swelling on the left side of her neck  Provide chocolate Ensure, coupons for Ensure, and log book to record blood pressure readings for the patient. To be given to the patient at her visit on 05-29-2019 at the office  Patient Self Care Activities related to HTN, HLD, and CKD Stage 3 :   Patient is unable to independently self-manage chronic health conditions  Initial goal documentation         Cassie Brewer was given information about Chronic Care Management services today including:  1. CCM service includes personalized support from designated clinical staff supervised by her physician, including individualized plan of care and coordination with other care providers 2. 24/7 contact phone numbers for assistance for urgent and routine care needs. 3. Service will only be billed when office clinical staff spend 20 minutes or more in a month to coordinate care. 4. Only one practitioner may furnish and bill the service in a calendar month. 5. The patient may stop CCM services at any time (effective at the end of the month) by phone call to the office staff. 6. The patient will be responsible for cost sharing (co-pay) of up to 20% of the service fee (after annual deductible is met).  Patient agreed to services and verbal consent obtained.   Plan:   Telephone follow up appointment with care management team member scheduled for: 07-18-2019 at [place], MSN, [facility] Mobile: [phone]

## 2019-05-25 NOTE — Patient Instructions (Signed)
Visit Information  Goals Addressed            This Visit's Progress   . RNCM: I take my blood pressure sometimes       CARE PLAN ENTRY (see longtitudinal plan of care for additional care plan information)  Current Barriers:  . Chronic Disease Management support, education, and care coordination needs related to HTN, HLD, and CKD Stage 3  Clinical Goal(s) related to HTN, HLD, and CKD Stage 3 :  Over the next 90days, patient will:  . Work with the care management team to address educational, disease management, and care coordination needs  . Begin or continue self health monitoring activities as directed today Measure and record blood pressure 3 times per week . Call provider office for new or worsened signs and symptoms Blood pressure findings outside established parameters and New or worsened symptom related to HLD/CKD3 and new concerns with back pain at times and left neck swelling . Call care management team with questions or concerns . Verbalize basic understanding of patient centered plan of care established today  Interventions related to HTN, HLD, and CKD Stage 3 :  . Evaluation of current treatment plans and patient's adherence to plan as established by provider . Assessed patient understanding of disease states . Assessed patient's education and care coordination needs . Provided disease specific education to patient  . Collaborated with appropriate clinical care team members regarding patient needs . Instructed the patient to write down concerns and questions she has to talk to pcp about at her appointment on 05-29-2019.  The patient wants to talk about her lower back pain at times and the swelling on the left side of her neck . Provide chocolate Ensure, coupons for Ensure, and log book to record blood pressure readings for the patient. To be given to the patient at her visit on 05-29-2019 at the office  Patient Self Care Activities related to HTN, HLD, and CKD Stage 3 :   . Patient is unable to independently self-manage chronic health conditions  Initial goal documentation        Cassie Brewer was given information about Chronic Care Management services today including:  1. CCM service includes personalized support from designated clinical staff supervised by her physician, including individualized plan of care and coordination with other care providers 2. 24/7 contact phone numbers for assistance for urgent and routine care needs. 3. Service will only be billed when office clinical staff spend 20 minutes or more in a month to coordinate care. 4. Only one practitioner may furnish and bill the service in a calendar month. 5. The patient may stop CCM services at any time (effective at the end of the month) by phone call to the office staff. 6. The patient will be responsible for cost sharing (co-pay) of up to 20% of the service fee (after annual deductible is met).  Patient agreed to services and verbal consent obtained.   Patient verbalizes understanding of instructions provided today.   Telephone follow up appointment with care management team member scheduled for:07-18-2019 at [place], MSN, [facility] Mobile: [phone]

## 2019-05-29 ENCOUNTER — Other Ambulatory Visit: Payer: Self-pay

## 2019-05-29 ENCOUNTER — Ambulatory Visit (INDEPENDENT_AMBULATORY_CARE_PROVIDER_SITE_OTHER): Payer: PPO | Admitting: Family Medicine

## 2019-05-29 ENCOUNTER — Encounter: Payer: Self-pay | Admitting: Family Medicine

## 2019-05-29 DIAGNOSIS — I129 Hypertensive chronic kidney disease with stage 1 through stage 4 chronic kidney disease, or unspecified chronic kidney disease: Secondary | ICD-10-CM | POA: Diagnosis not present

## 2019-05-29 DIAGNOSIS — N183 Chronic kidney disease, stage 3 unspecified: Secondary | ICD-10-CM | POA: Diagnosis not present

## 2019-05-29 DIAGNOSIS — E039 Hypothyroidism, unspecified: Secondary | ICD-10-CM

## 2019-05-29 DIAGNOSIS — E785 Hyperlipidemia, unspecified: Secondary | ICD-10-CM | POA: Diagnosis not present

## 2019-05-29 DIAGNOSIS — I10 Essential (primary) hypertension: Secondary | ICD-10-CM | POA: Diagnosis not present

## 2019-05-29 MED ORDER — HYDROCHLOROTHIAZIDE 25 MG PO TABS: 25.0000 mg | ORAL_TABLET | Freq: Every day | ORAL | 1 refills | Status: DC

## 2019-05-29 MED ORDER — CLOTRIMAZOLE-BETAMETHASONE 1-0.05 % EX CREA: 1.0000 "application " | TOPICAL_CREAM | Freq: Two times a day (BID) | CUTANEOUS | 3 refills | Status: DC

## 2019-05-29 MED ORDER — BENAZEPRIL HCL 40 MG PO TABS: 40.0000 mg | ORAL_TABLET | Freq: Every day | ORAL | 1 refills | Status: DC

## 2019-05-29 NOTE — Assessment & Plan Note (Signed)
Labs drawn today. Await results. Treat as needed.  

## 2019-05-29 NOTE — Assessment & Plan Note (Signed)
Under good control on current regimen. Continue current regimen. Continue to monitor. Call with any concerns. Refills given. Labs drawn today.   

## 2019-05-29 NOTE — Progress Notes (Signed)
BP 137/70   Pulse 63   Temp 98.1 F (36.7 C) (Oral)   Ht 5' 2.5" (1.588 m)   Wt 152 lb (68.9 kg)   LMP 05/13/1980   SpO2 95%   BMI 27.36 kg/m    Subjective:    Patient ID: Cassie Brewer, female    DOB: [birth year], 86 y.o.   MRN: [id number]  HPI: Cassie Brewer is a 86 y.o. female  Chief Complaint  Patient presents with  . Hypertension  . Hyperlipidemia  . Hypothyroidism   HYPERTENSION / HYPERLIPIDEMIA Satisfied with current treatment? yes Duration of hypertension: chronic BP monitoring frequency: not checking BP medication side effects: no Past BP meds: HCTZ, benazepril Duration of hyperlipidemia: chronic Cholesterol medication side effects: no Cholesterol supplements: fish oil Past cholesterol medications: none Medication compliance: excellent compliance Aspirin: no Recent stressors: yes Recurrent headaches: no Visual changes: no Palpitations: no Dyspnea: no Chest pain: no Lower extremity edema: no Dizzy/lightheaded: no  HYPOTHYROIDISM Thyroid control status:controlled Satisfied with current treatment? yes Medication side effects: yes Medication compliance: excellent compliance Recent dose adjustment:no Fatigue: no Cold intolerance: no Heat intolerance: no Weight gain: no Weight loss: no Constipation: no Diarrhea/loose stools: no Palpitations: no Lower extremity edema: no Anxiety/depressed mood: no   Relevant past medical, surgical, family and social history reviewed and updated as indicated. Interim medical history since our last visit reviewed. Allergies and medications reviewed and updated.  Review of Systems  Constitutional: Negative.   HENT: Negative.   Respiratory: Negative.   Cardiovascular: Negative.   Musculoskeletal: Negative.   Neurological: Negative.   Psychiatric/Behavioral: Negative.     Per HPI unless specifically indicated above     Objective:    BP 137/70   Pulse 63   Temp 98.1 F (36.7 C) (Oral)   Ht 5' 2.5"  (1.588 m)   Wt 152 lb (68.9 kg)   LMP 05/13/1980   SpO2 95%   BMI 27.36 kg/m   Wt Readings from Last 3 Encounters:  05/29/19 152 lb (68.9 kg)  02/07/19 151 lb 9.6 oz (68.8 kg)  11/27/18 146 lb (66.2 kg)    Physical Exam Vitals and nursing note reviewed.  Constitutional:      General: She is not in acute distress.    Appearance: Normal appearance. She is not ill-appearing, toxic-appearing or diaphoretic.  HENT:     Head: Normocephalic and atraumatic.     Right Ear: External ear normal.     Left Ear: External ear normal.     Nose: Nose normal.     Mouth/Throat:     Mouth: Mucous membranes are moist.     Pharynx: Oropharynx is clear.  Eyes:     General: No scleral icterus.       Right eye: No discharge.        Left eye: No discharge.     Extraocular Movements: Extraocular movements intact.     Conjunctiva/sclera: Conjunctivae normal.     Pupils: Pupils are equal, round, and reactive to light.  Cardiovascular:     Rate and Rhythm: Normal rate and regular rhythm.     Pulses: Normal pulses.     Heart sounds: Normal heart sounds. No murmur. No friction rub. No gallop.   Pulmonary:     Effort: Pulmonary effort is normal. No respiratory distress.     Breath sounds: Normal breath sounds. No stridor. No wheezing, rhonchi or rales.  Chest:     Chest wall: No tenderness.  Musculoskeletal:  General: Normal range of motion.     Cervical back: Normal range of motion and neck supple.  Skin:    General: Skin is warm and dry.     Capillary Refill: Capillary refill takes less than 2 seconds.     Coloration: Skin is not jaundiced or pale.     Findings: No bruising, erythema, lesion or rash.  Neurological:     General: No focal deficit present.     Mental Status: She is alert and oriented to person, place, and time. Mental status is at baseline.  Psychiatric:        Mood and Affect: Mood normal.        Behavior: Behavior normal.        Thought Content: Thought content normal.         Judgment: Judgment normal.     Results for orders placed or performed during the hospital encounter of 02/16/19  I-STAT creatinine  Result Value Ref Range   Creatinine, Ser 1.00 0.44 - 1.00 mg/dL      Assessment & Plan:   Problem List Items Addressed This Visit      Endocrine   Hypothyroidism    Labs drawn today. Await results. Treat as needed.       Relevant Orders   Comprehensive metabolic panel   TSH     Genitourinary   Benign hypertensive renal disease    Under good control on current regimen. Continue current regimen. Continue to monitor. Call with any concerns. Refills given. Labs drawn today.       Relevant Medications   hydrochlorothiazide (HYDRODIURIL) 25 MG tablet   benazepril (LOTENSIN) 40 MG tablet   Other Relevant Orders   Comprehensive metabolic panel   CKD (chronic kidney disease), stage III    Labs drawn today. Await results. Treat as needed.       Relevant Orders   Comprehensive metabolic panel     Other   Hyperlipidemia    Under good control on current regimen. Continue current regimen. Continue to monitor. Call with any concerns. Refills given. Labs drawn today.       Relevant Medications   hydrochlorothiazide (HYDRODIURIL) 25 MG tablet   benazepril (LOTENSIN) 40 MG tablet   Other Relevant Orders   Comprehensive metabolic panel   Lipid Panel w/o Chol/HDL Ratio    Other Visit Diagnoses    Essential hypertension       Relevant Medications   hydrochlorothiazide (HYDRODIURIL) 25 MG tablet   benazepril (LOTENSIN) 40 MG tablet       Follow up plan: Return in about 6 months (around 11/29/2019) for Physical.

## 2019-05-30 LAB — COMPREHENSIVE METABOLIC PANEL
ALT: 13 IU/L (ref 0–32)
AST: 18 IU/L (ref 0–40)
Albumin/Globulin Ratio: 1.7 (ref 1.2–2.2)
Albumin: 4.6 g/dL (ref 3.6–4.6)
Alkaline Phosphatase: 59 IU/L (ref 39–117)
BUN/Creatinine Ratio: 19 (ref 12–28)
BUN: 22 mg/dL (ref 8–27)
Bilirubin Total: 0.4 mg/dL (ref 0.0–1.2)
CO2: 24 mmol/L (ref 20–29)
Calcium: 9.8 mg/dL (ref 8.7–10.3)
Chloride: 100 mmol/L (ref 96–106)
Creatinine, Ser: 1.13 mg/dL — ABNORMAL HIGH (ref 0.57–1.00)
GFR calc Af Amer: 50 mL/min/{1.73_m2} — ABNORMAL LOW (ref >59)
GFR calc non Af Amer: 43 mL/min/{1.73_m2} — ABNORMAL LOW (ref >59)
Globulin, Total: 2.7 g/dL (ref 1.5–4.5)
Glucose: 103 mg/dL — ABNORMAL HIGH (ref 65–99)
Potassium: 4.3 mmol/L (ref 3.5–5.2)
Sodium: 139 mmol/L (ref 134–144)
Total Protein: 7.3 g/dL (ref 6.0–8.5)

## 2019-05-30 LAB — LIPID PANEL W/O CHOL/HDL RATIO
Cholesterol, Total: 261 mg/dL — ABNORMAL HIGH (ref 100–199)
HDL: 83 mg/dL (ref >39)
LDL Chol Calc (NIH): 164 mg/dL — ABNORMAL HIGH (ref 0–99)
Triglycerides: 82 mg/dL (ref 0–149)
VLDL Cholesterol Cal: 14 mg/dL (ref 5–40)

## 2019-05-30 LAB — TSH: TSH: 2.21 u[IU]/mL (ref 0.450–4.500)

## 2019-06-04 ENCOUNTER — Other Ambulatory Visit: Payer: Self-pay | Admitting: Family Medicine

## 2019-06-04 DIAGNOSIS — E039 Hypothyroidism, unspecified: Secondary | ICD-10-CM

## 2019-06-04 NOTE — Telephone Encounter (Signed)
Refill for LEVOTHYROXINE 112 MCG TABLET. Prescription expired 02/26/2 LOV 05/29/19

## 2019-07-06 DIAGNOSIS — H353131 Nonexudative age-related macular degeneration, bilateral, early dry stage: Secondary | ICD-10-CM | POA: Diagnosis not present

## 2019-07-06 DIAGNOSIS — H401131 Primary open-angle glaucoma, bilateral, mild stage: Secondary | ICD-10-CM | POA: Diagnosis not present

## 2019-07-06 DIAGNOSIS — H26493 Other secondary cataract, bilateral: Secondary | ICD-10-CM | POA: Diagnosis not present

## 2019-07-12 ENCOUNTER — Other Ambulatory Visit: Payer: Self-pay

## 2019-07-12 ENCOUNTER — Ambulatory Visit: Payer: PPO | Admitting: Podiatry

## 2019-07-12 ENCOUNTER — Encounter: Payer: Self-pay | Admitting: Podiatry

## 2019-07-12 VITALS — Temp 98.0°F

## 2019-07-12 DIAGNOSIS — M79675 Pain in left toe(s): Secondary | ICD-10-CM

## 2019-07-12 DIAGNOSIS — M792 Neuralgia and neuritis, unspecified: Secondary | ICD-10-CM

## 2019-07-12 DIAGNOSIS — M79674 Pain in right toe(s): Secondary | ICD-10-CM

## 2019-07-12 DIAGNOSIS — B351 Tinea unguium: Secondary | ICD-10-CM

## 2019-07-12 DIAGNOSIS — L853 Xerosis cutis: Secondary | ICD-10-CM

## 2019-07-13 ENCOUNTER — Encounter: Payer: Self-pay | Admitting: Podiatry

## 2019-07-13 MED ORDER — NONFORMULARY OR COMPOUNDED ITEM: 1 refills | Status: DC

## 2019-07-13 NOTE — Progress Notes (Signed)
  Subjective:  Patient ID: Cassie Brewer, female    DOB: 1928/12/19,  MRN: [id number]  Chief Complaint  Patient presents with  . Foot Pain  . Tinea Pedis   86 y.o. female returns for the above complaint.  Patient presents with thickened elongated mycotic toenails x10.  Patient is especially concerned for right hallux onychomycosis.  Patient states that this has been going on for years and has progressively gotten worse.  She states it hurts sometimes.  She also has secondary complaint of neuropathic pain.  Patient states that this pain has been going on for quite some time.  She has tried many over-the-counter medication but has not helped.  She has not tried neuropathic cream.  She denies any other acute complaints. Objective:   Vitals:   07/12/19 1427  Temp: 98 F (36.7 C)   Podiatric Exam: Vascular: dorsalis pedis and posterior tibial pulses are palpable bilateral. Capillary return is immediate. Temperature gradient is WNL. Skin turgor WNL  Sensorium: Normal Semmes Weinstein monofilament test. Normal tactile sensation bilaterally.  However she experiences subjective neuropathic pain to the distal tip of the toes.  Intact sensation Nail Exam: Pt has thick disfigured discolored nails with subungual debris noted bilateral entire nail hallux through fifth toenails Ulcer Exam: There is no evidence of ulcer or pre-ulcerative changes or infection. Orthopedic Exam: Muscle tone and strength are WNL. No limitations in general ROM. No crepitus or effusions noted. HAV  B/L.  Hammer toes 2-5  B/L. Skin: No Porokeratosis. No infection or ulcers.  Xerosis/psoriasis of the plantar foot.  There is mild cracking noted.  No bleeding noted.  Assessment & Plan:  Patient was evaluated and treated and all questions answered.  Neuropathic pain -I explained to the patient the etiology of neuropathic pain and various treatment options were extensively discussed.  I believe she will benefit from neuropathic  cream to help decrease the pain associated with this -Prescription for neuropathic cream was sent to [facility] drug pharmacy.   Xerosis/psoriasis -I explained to the patient the etiology of dryness with cracking as well as various treatment options associated with it.  Given that she is doing good with clotrimazole cream that was given to her by dermatologist and had diagnosed her with psoriasis, I believe she can continue using the cream.  Patient agrees with this plan.  Onychomycosis with pain  -Nails palliatively debrided as below. -Educated on self-care  Procedure: Nail Debridement Rationale: pain  Type of Debridement: manual, sharp debridement. Instrumentation: Nail nipper, rotary burr. Number of Nails: 10  Procedures and Treatment: Consent by patient was obtained for treatment procedures. The patient understood the discussion of treatment and procedures well. All questions were answered thoroughly reviewed. Debridement of mycotic and hypertrophic toenails, 1 through 5 bilateral and clearing of subungual debris. No ulceration, no infection noted.  Return Visit-Office Procedure: Patient instructed to return to the office for a follow up visit 3 months for continued evaluation and treatment.  Boneta Lucks, DPM    No follow-ups on file.

## 2019-07-13 NOTE — Addendum Note (Signed)
Addended by: Graceann Congress D on: 07/13/2019 08:52 AM   Modules accepted: Orders

## 2019-07-18 ENCOUNTER — Telehealth: Payer: Self-pay | Admitting: General Practice

## 2019-07-18 ENCOUNTER — Ambulatory Visit (INDEPENDENT_AMBULATORY_CARE_PROVIDER_SITE_OTHER): Payer: PPO | Admitting: General Practice

## 2019-07-18 DIAGNOSIS — E785 Hyperlipidemia, unspecified: Secondary | ICD-10-CM

## 2019-07-18 DIAGNOSIS — I10 Essential (primary) hypertension: Secondary | ICD-10-CM | POA: Diagnosis not present

## 2019-07-18 DIAGNOSIS — N183 Chronic kidney disease, stage 3 unspecified: Secondary | ICD-10-CM

## 2019-07-18 NOTE — Patient Instructions (Signed)
Visit Information  Goals Addressed            This Visit's Progress   . RNCM: I take my blood pressure sometimes       CARE PLAN ENTRY (see longtitudinal plan of care for additional care plan information)  Current Barriers:  . Chronic Disease Management support, education, and care coordination needs related to HTN, HLD, and CKD Stage 3  Clinical Goal(s) related to HTN, HLD, and CKD Stage 3 :  Over the next 90days, patient will:  . Work with the care management team to address educational, disease management, and care coordination needs  . Begin or continue self health monitoring activities as directed today Measure and record blood pressure 3 times per week . Call provider office for new or worsened signs and symptoms Blood pressure findings outside established parameters and New or worsened symptom related to HLD/CKD3 and new concerns with back pain at times and left neck swelling . Call care management team with questions or concerns . Verbalize basic understanding of patient centered plan of care established today  Interventions related to HTN, HLD, and CKD Stage 3 :  . Evaluation of current treatment plans and patient's adherence to plan as established by provider . Assessed patient understanding of disease states . Assessed patient's education and care coordination needs: The patient was locked out of her my chart.  Education on the user name and ask the patient to try as it was listed and if she had issues to let the RNCM know and the RNCM will assist by resetting the password for the patient. The patient verbalized that she would have her daughter in law who was there help her also. The patient verbalized thanks and understanding.  . Printed the patients lab work per patient request.  The patient wanted a copy of her recent lab work in March. She can see it in my chart but wanted to have a physical copy to look at. Will place readings in mail for the patient today.  . Provided  disease specific education to patient  . Collaborated with appropriate clinical care team members regarding patient needs . Instructed the patient to write down concerns and questions she has to talk to pcp about at her appointment on 05-29-2019.  The patient wants to talk about her lower back pain at times and the swelling on the left side of her neck. Completed.  The patient states she thinks she has the back pain when she is doing more in her garden than usual. She is learning to pace her activities.  . Provide chocolate Ensure, coupons for Ensure, and log book to record blood pressure readings for the patient. To be given to the patient at her visit on 05-29-2019 at the office.  Completed  Patient Self Care Activities related to HTN, HLD, and CKD Stage 3 :  . Patient is unable to independently self-manage chronic health conditions  Please see past updates related to this goal by clicking on the "Past Updates" button in the selected goal         Patient verbalizes understanding of instructions provided today.   The care management team will reach out to the patient again over the next 60 days.   Noreene Larsson RN, MSN, [facility] Mobile: [phone]

## 2019-07-18 NOTE — Chronic Care Management (AMB) (Signed)
Chronic Care Management   Follow Up Note   07/18/2019 Name: Cassie Brewer MRN: [id number] DOB: 1928-09-05  Referred by: Valerie Roys, DO Reason for referral : Chronic Care Management (Follow up with HTN/HLD/CKD3 and other concerns)   Cassie Brewer is a 86 y.o. year old female who is a primary care patient of Valerie Roys, DO. The CCM team was consulted for assistance with chronic disease management and care coordination needs.    Review of patient status, including review of consultants reports, relevant laboratory and other test results, and collaboration with appropriate care team members and the patient's provider was performed as part of comprehensive patient evaluation and provision of chronic care management services.    SDOH (Social Determinants of Health) assessments performed: No See Care Plan activities for detailed interventions related to [facility])     Outpatient Encounter Medications as of 07/18/2019  Medication Sig Note  . ammonium lactate (LAC-HYDRIN) 12 % cream Apply topically as needed for dry skin.   Marland Kitchen benazepril (LOTENSIN) 40 MG tablet Take 1 tablet (40 mg total) by mouth daily.   . Calcium Carbonate-Vit D-Min (CALCIUM 600+D PLUS MINERALS) 600-400 MG-UNIT TABS  05/22/2015: Received from: [facility]  . clotrimazole-betamethasone (LOTRISONE) cream Apply 1 application topically 2 (two) times daily.   . famotidine (PEPCID) 40 MG tablet Take 1 tablet (40 mg total) by mouth at bedtime.   . fluticasone (FLONASE) 50 MCG/ACT nasal spray SPRAY 2 SPRAYS INTO EACH NOSTRIL EVERY DAY   . hydrochlorothiazide (HYDRODIURIL) 25 MG tablet Take 1 tablet (25 mg total) by mouth daily.   Marland Kitchen latanoprost (XALATAN) 0.005 % ophthalmic solution Place 1 drop into both eyes at bedtime. 04/24/2015: Received from: External Pharmacy  . levothyroxine (SYNTHROID) 112 MCG tablet TAKE 1 TABLET (112 MCG TOTAL) BY MOUTH DAILY BEFORE BREAKFAST.   . Multiple Vitamins-Minerals (CENTRUM  SILVER ULTRA WOMENS PO) Take by mouth.   . NONFORMULARY OR COMPOUNDED ITEM See pharmacy note   . Omega-3 Fatty Acids (FISH OIL) 1000 MG CAPS Take by mouth daily.   . timolol (TIMOPTIC) 0.5 % ophthalmic solution    . triamcinolone ointment (KENALOG) 0.5 % Apply 1 application topically 2 (two) times daily.   . vitamin C (ASCORBIC ACID) 500 MG tablet Take 500 mg by mouth daily.    No facility-administered encounter medications on file as of 07/18/2019.     Objective:  BP Readings from Last 3 Encounters:  05/29/19 137/70  02/07/19 (!) 154/77  11/27/18 131/79    Goals Addressed            This Visit's Progress   . RNCM: I take my blood pressure sometimes       CARE PLAN ENTRY (see longtitudinal plan of care for additional care plan information)  Current Barriers:  . Chronic Disease Management support, education, and care coordination needs related to HTN, HLD, and CKD Stage 3  Clinical Goal(s) related to HTN, HLD, and CKD Stage 3 :  Over the next 90days, patient will:  . Work with the care management team to address educational, disease management, and care coordination needs  . Begin or continue self health monitoring activities as directed today Measure and record blood pressure 3 times per week . Call provider office for new or worsened signs and symptoms Blood pressure findings outside established parameters and New or worsened symptom related to HLD/CKD3 and new concerns with back pain at times and left neck swelling . Call care management team with  questions or concerns . Verbalize basic understanding of patient centered plan of care established today  Interventions related to HTN, HLD, and CKD Stage 3 :  . Evaluation of current treatment plans and patient's adherence to plan as established by provider . Assessed patient understanding of disease states . Assessed patient's education and care coordination needs: The patient was locked out of her my chart.  Education on the  user name and ask the patient to try as it was listed and if she had issues to let the RNCM know and the RNCM will assist by resetting the password for the patient. The patient verbalized that she would have her daughter in law who was there help her also. The patient verbalized thanks and understanding.  . Printed the patients lab work per patient request.  The patient wanted a copy of her recent lab work in March. She can see it in my chart but wanted to have a physical copy to look at. Will place readings in mail for the patient today.  . Provided disease specific education to patient  . Collaborated with appropriate clinical care team members regarding patient needs . Instructed the patient to write down concerns and questions she has to talk to pcp about at her appointment on 05-29-2019.  The patient wants to talk about her lower back pain at times and the swelling on the left side of her neck. Completed.  The patient states she thinks she has the back pain when she is doing more in her garden than usual. She is learning to pace her activities.  . Provide chocolate Ensure, coupons for Ensure, and log book to record blood pressure readings for the patient. To be given to the patient at her visit on 05-29-2019 at the office.  Completed  Patient Self Care Activities related to HTN, HLD, and CKD Stage 3 :  . Patient is unable to independently self-manage chronic health conditions  Please see past updates related to this goal by clicking on the "Past Updates" button in the selected goal          Plan:   The care management team will reach out to the patient again over the next 60 days.    Noreene Larsson RN, MSN, [facility] Mobile: [phone]

## 2019-08-02 ENCOUNTER — Other Ambulatory Visit: Payer: Self-pay | Admitting: Gastroenterology

## 2019-09-18 ENCOUNTER — Telehealth: Payer: Self-pay

## 2019-09-18 ENCOUNTER — Ambulatory Visit: Payer: Self-pay | Admitting: General Practice

## 2019-09-18 NOTE — Chronic Care Management (AMB) (Signed)
°  Chronic Care Management   Outreach Note  09/18/2019 Name: Cassie Brewer MRN: [id number] DOB: 1928-08-31  Referred by: Valerie Roys, DO Reason for referral : Chronic Care Management (Follow up: RNCM Chronic Disease Management and Care Coordination Needs)   An unsuccessful telephone outreach was attempted today. The patient was referred to the case management team for assistance with care management and care coordination.   Follow Up Plan: A HIPPA compliant phone message was left for the patient providing contact information and requesting a return call.   Noreene Larsson RN, MSN, [facility] Mobile: [phone]

## 2019-10-26 ENCOUNTER — Telehealth: Payer: Self-pay | Admitting: General Practice

## 2019-10-26 ENCOUNTER — Telehealth: Payer: Self-pay

## 2019-10-26 NOTE — Telephone Encounter (Signed)
  Chronic Care Management   Outreach Note  10/26/2019 Name: Cassie Brewer MRN: [id number] DOB: Oct 17, 1928  Referred by: Valerie Roys, DO Reason for referral : Appointment (RNCM Follow up- 2nd attempt for Chronic Disease Managemnt and Care Coordination Needs)   An unsuccessful telephone outreach was attempted today. The patient was referred to the case management team for assistance with care management and care coordination. Spoke to the patient briefly on the phone today. Wished her a happy birthday. She could not talk at the time and ask for a call back next week. Will reach out to the patient on Tuesday.   Follow Up Plan: Telephone follow up appointment with care management team member scheduled for: 10-30-2019 at [place], MSN, [facility] Mobile: [phone]

## 2019-10-30 ENCOUNTER — Telehealth: Payer: Self-pay | Admitting: General Practice

## 2019-10-30 ENCOUNTER — Ambulatory Visit (INDEPENDENT_AMBULATORY_CARE_PROVIDER_SITE_OTHER): Payer: PPO | Admitting: General Practice

## 2019-10-30 DIAGNOSIS — I10 Essential (primary) hypertension: Secondary | ICD-10-CM | POA: Diagnosis not present

## 2019-10-30 DIAGNOSIS — E785 Hyperlipidemia, unspecified: Secondary | ICD-10-CM | POA: Diagnosis not present

## 2019-10-30 DIAGNOSIS — N183 Chronic kidney disease, stage 3 unspecified: Secondary | ICD-10-CM | POA: Diagnosis not present

## 2019-10-30 NOTE — Chronic Care Management (AMB) (Signed)
Chronic Care Management   Follow Up Note   10/30/2019 Name: Cassie Brewer MRN: [id number] DOB: Oct 06, 1928  Referred by: Valerie Roys, DO Reason for referral : Chronic Care Management (RNCM: Chronic Disease Managemnt and Care Coordination Needs)   Cassie Brewer is a 86 y.o. year old female who is a primary care patient of Valerie Roys, DO. The CCM team was consulted for assistance with chronic disease management and care coordination needs.    Review of patient status, including review of consultants reports, relevant laboratory and other test results, and collaboration with appropriate care team members and the patient's provider was performed as part of comprehensive patient evaluation and provision of chronic care management services.    SDOH (Social Determinants of Health) assessments performed: Yes See Care Plan activities for detailed interventions related to [facility])     Outpatient Encounter Medications as of 10/30/2019  Medication Sig Note  . ammonium lactate (LAC-HYDRIN) 12 % cream Apply topically as needed for dry skin.   Marland Kitchen benazepril (LOTENSIN) 40 MG tablet Take 1 tablet (40 mg total) by mouth daily.   . Calcium Carbonate-Vit D-Min (CALCIUM 600+D PLUS MINERALS) 600-400 MG-UNIT TABS  05/22/2015: Received from: [facility]  . clotrimazole-betamethasone (LOTRISONE) cream Apply 1 application topically 2 (two) times daily.   . famotidine (PEPCID) 40 MG tablet TAKE 1 TABLET BY MOUTH EVERYDAY AT BEDTIME   . fluticasone (FLONASE) 50 MCG/ACT nasal spray SPRAY 2 SPRAYS INTO EACH NOSTRIL EVERY DAY   . hydrochlorothiazide (HYDRODIURIL) 25 MG tablet Take 1 tablet (25 mg total) by mouth daily.   Marland Kitchen latanoprost (XALATAN) 0.005 % ophthalmic solution Place 1 drop into both eyes at bedtime. 04/24/2015: Received from: External Pharmacy  . levothyroxine (SYNTHROID) 112 MCG tablet TAKE 1 TABLET (112 MCG TOTAL) BY MOUTH DAILY BEFORE BREAKFAST.   . Multiple Vitamins-Minerals  (CENTRUM SILVER ULTRA WOMENS PO) Take by mouth.   . NONFORMULARY OR COMPOUNDED ITEM See pharmacy note   . Omega-3 Fatty Acids (FISH OIL) 1000 MG CAPS Take by mouth daily.   . timolol (TIMOPTIC) 0.5 % ophthalmic solution    . triamcinolone ointment (KENALOG) 0.5 % Apply 1 application topically 2 (two) times daily.   . vitamin C (ASCORBIC ACID) 500 MG tablet Take 500 mg by mouth daily.    No facility-administered encounter medications on file as of 10/30/2019.     Objective:  BP Readings from Last 3 Encounters:  05/29/19 137/70  02/07/19 (!) 154/77  11/27/18 131/79    Goals Addressed            This Visit's Progress   . RNCM: I take my blood pressure sometimes       CARE PLAN ENTRY (see longtitudinal plan of care for additional care plan information)  Current Barriers:  . Chronic Disease Management support, education, and care coordination needs related to HTN, HLD, and CKD Stage 3  Clinical Goal(s) related to HTN, HLD, and CKD Stage 3 :  Over the next 90days, patient will:  . Work with the care management team to address educational, disease management, and care coordination needs  . Begin or continue self health monitoring activities as directed today Measure and record blood pressure 3 times per week . Call provider office for new or worsened signs and symptoms Blood pressure findings outside established parameters and New or worsened symptom related to HLD/CKD3 and new concerns with back pain at times and left neck swelling . Call care management team with questions  or concerns . Verbalize basic understanding of patient centered plan of care established today  Interventions related to HTN, HLD, and CKD Stage 3 :  . Evaluation of current treatment plans and patient's adherence to plan as established by provider. The patient is compliant with the plan of care.  . Assessed patient understanding of disease states. 10-30-2019: Has good understanding of chronic conditions. Ask  questions today about her thyroid possibly being the reason why she is gain weight around her waist.  Advised the patient to write down questions to ask the pcp at her next appointment on 11-29-2019. . Assessed patient's education and care coordination needs: The patient was locked out of her my chart.  Education on the user name and ask the patient to try as it was listed and if she had issues to let the RNCM know and the RNCM will assist by resetting the password for the patient. The patient verbalized that she would have her daughter in law who was there help her also. The patient verbalized thanks and understanding. 10-30-2019: still has an issue at times getting in my Chart. Forgot to get her daughter to help. Will keep working with it.  . Printed the patients lab work per patient request.  The patient wanted a copy of her recent lab work in March. She can see it in my chart but wanted to have a physical copy to look at. Will place readings in mail for the patient today. 10-30-2019: Did request a copy of her lab work printed out when she has labs drawn. Instructed the patient to ask for a copy of lab work at the office when she is here. The patient thanked the [facility] for sending her labwork in mail from her last visit.  Marland Kitchen Provided disease specific education to patient. 10-30-2019: Education on pacing activities and not going outside when it is hot to work in her yard. The patient enjoys being outside. The patient is eating well and denies any deviation from Heart Healthy Diet. The patient states she is noticing a "pudgy" area at her midline and is concerned this has something to do with her thyroid. Will discuss with the pcp on next visit.  Nash Dimmer with appropriate clinical care team members regarding patient needs.  The patient denies any needs at this time. Knows the LCSW and pharmacist are here to help as needed.  . Evaluation of upcoming appointments. Sees Dr. Posey Pronto- 11-01-2019 about her feet and problems  she is having with her feet. The patient has an appointment with pcp on 11-29-2019 at 9 am.  RNCM will follow up with the patient on 01-08-2020 at 10:30.  The patent will call for changes or needs before the next outreach.   Patient Self Care Activities related to HTN, HLD, and CKD Stage 3 :  . Patient is unable to independently self-manage chronic health conditions  Please see past updates related to this goal by clicking on the "Past Updates" button in the selected goal          Plan:   Telephone follow up appointment with care management team member scheduled for: 01-08-2020 at 10:30   [place], MSN, [facility] Mobile: [phone]

## 2019-10-30 NOTE — Patient Instructions (Signed)
Visit Information  Goals Addressed            This Visit's Progress    RNCM: I take my blood pressure sometimes       CARE PLAN ENTRY (see longtitudinal plan of care for additional care plan information)  Current Barriers:   Chronic Disease Management support, education, and care coordination needs related to HTN, HLD, and CKD Stage 3  Clinical Goal(s) related to HTN, HLD, and CKD Stage 3 :  Over the next 90days, patient will:   Work with the care management team to address educational, disease management, and care coordination needs   Begin or continue self health monitoring activities as directed today Measure and record blood pressure 3 times per week  Call provider office for new or worsened signs and symptoms Blood pressure findings outside established parameters and New or worsened symptom related to HLD/CKD3 and new concerns with back pain at times and left neck swelling  Call care management team with questions or concerns  Verbalize basic understanding of patient centered plan of care established today  Interventions related to HTN, HLD, and CKD Stage 3 :   Evaluation of current treatment plans and patient's adherence to plan as established by provider. The patient is compliant with the plan of care.   Assessed patient understanding of disease states. 10-30-2019: Has good understanding of chronic conditions. Ask questions today about her thyroid possibly being the reason why she is gain weight around her waist.  Advised the patient to write down questions to ask the pcp at her next appointment on 11-29-2019.  Assessed patient's education and care coordination needs: The patient was locked out of her my chart.  Education on the user name and ask the patient to try as it was listed and if she had issues to let the RNCM know and the RNCM will assist by resetting the password for the patient. The patient verbalized that she would have her daughter in law who was there help her  also. The patient verbalized thanks and understanding. 10-30-2019: still has an issue at times getting in my Chart. Forgot to get her daughter to help. Will keep working with it.   Printed the patients lab work per patient request.  The patient wanted a copy of her recent lab work in March. She can see it in my chart but wanted to have a physical copy to look at. Will place readings in mail for the patient today. 10-30-2019: Did request a copy of her lab work printed out when she has labs drawn. Instructed the patient to ask for a copy of lab work at the office when she is here. The patient thanked the [facility] for sending her labwork in mail from her last visit.   Provided disease specific education to patient. 10-30-2019: Education on pacing activities and not going outside when it is hot to work in her yard. The patient enjoys being outside. The patient is eating well and denies any deviation from Heart Healthy Diet. The patient states she is noticing a "pudgy" area at her midline and is concerned this has something to do with her thyroid. Will discuss with the pcp on next visit.   Collaborated with appropriate clinical care team members regarding patient needs.  The patient denies any needs at this time. Knows the LCSW and pharmacist are here to help as needed.   Evaluation of upcoming appointments. Sees Dr. Posey Pronto- 11-01-2019 about her feet and problems she is having with her feet.  The patient has an appointment with pcp on 11-29-2019 at 9 am.  RNCM will follow up with the patient on 01-08-2020 at 10:30.  The patent will call for changes or needs before the next outreach.   Patient Self Care Activities related to HTN, HLD, and CKD Stage 3 :   Patient is unable to independently self-manage chronic health conditions  Please see past updates related to this goal by clicking on the "Past Updates" button in the selected goal         Patient verbalizes understanding of instructions provided today.   Telephone  follow up appointment with care management team member scheduled for: 01-08-2020 at 90 am  Noreene Larsson RN, MSN, [facility] Mobile: [phone]

## 2019-11-01 ENCOUNTER — Ambulatory Visit: Payer: PPO | Admitting: Podiatry

## 2019-11-01 ENCOUNTER — Encounter: Payer: Self-pay | Admitting: Podiatry

## 2019-11-01 ENCOUNTER — Other Ambulatory Visit: Payer: Self-pay

## 2019-11-01 DIAGNOSIS — M79674 Pain in right toe(s): Secondary | ICD-10-CM

## 2019-11-01 DIAGNOSIS — M79675 Pain in left toe(s): Secondary | ICD-10-CM

## 2019-11-01 DIAGNOSIS — B351 Tinea unguium: Secondary | ICD-10-CM | POA: Diagnosis not present

## 2019-11-01 DIAGNOSIS — L600 Ingrowing nail: Secondary | ICD-10-CM | POA: Diagnosis not present

## 2019-11-02 ENCOUNTER — Encounter: Payer: Self-pay | Admitting: Podiatry

## 2019-11-02 NOTE — Progress Notes (Signed)
Subjective:  Patient ID: Cassie Brewer, female    DOB: Mar 08, 1929,  MRN: [id number]  Chief Complaint  Patient presents with  . Nail Problem    nail trim, red itchy burning feet   86 y.o. female returns for the above complaint.  Patient presents with thickened elongated mycotic toenails x10.  Patient is especially concerned for right hallux onychomycosis.  Patient states that this has been going on for years and has progressively gotten worse.  She states it hurts sometimes.  She also has secondary complaint of right hallux ingrown for which she states is painful when she is walking on it and has been hurting with pressure.  She has not had any procedures done to the area.  She just wants to know if there is something that can be done including a slant back procedure.  She denies any other acute complaints.  She would like to discuss treatment options.  Complaints. Objective:   There were no vitals filed for this visit. Podiatric Exam: Vascular: dorsalis pedis and posterior tibial pulses are palpable bilateral. Capillary return is immediate. Temperature gradient is WNL. Skin turgor WNL  Sensorium: Normal Semmes Weinstein monofilament test. Normal tactile sensation bilaterally.  However she experiences subjective neuropathic pain to the distal tip of the toes.  Intact sensation Nail Exam: Pt has thick disfigured discolored nails with subungual debris noted bilateral entire nail hallux through fifth toenails.  Ingrown noted to the right hallux lateral border.  Mild pain on palpation. Ulcer Exam: There is no evidence of ulcer or pre-ulcerative changes or infection. Orthopedic Exam: Muscle tone and strength are WNL. No limitations in general ROM. No crepitus or effusions noted. HAV  B/L.  Hammer toes 2-5  B/L. Skin: No Porokeratosis. No infection or ulcers.  Xerosis/psoriasis of the plantar foot.  There is mild cracking noted.  No bleeding noted.  Assessment & Plan:  Patient was evaluated and  treated and all questions answered.  Right hallux ingrown -I discussed with her various treatment options that were well both to her including performing a slant back procedure.  Patient opted for that procedure as opposed to more aggressive given her age as well as history of circulation issue.  Using nail nipper, the procedure was carried out in standard technique without any issues.  No complications noted.  Neuropathic pain -I explained to the patient the etiology of neuropathic pain and various treatment options were extensively discussed.  I believe she will benefit from neuropathic cream to help decrease the pain associated with this -Prescription for neuropathic cream was sent to [facility] drug pharmacy.   Xerosis/psoriasis -I explained to the patient the etiology of dryness with cracking as well as various treatment options associated with it.  Given that she is doing good with clotrimazole cream that was given to her by dermatologist and had diagnosed her with psoriasis, I believe she can continue using the cream.  Patient agrees with this plan.  Onychomycosis with pain  -Nails palliatively debrided as below. -Educated on self-care  Procedure: Nail Debridement Rationale: pain  Type of Debridement: manual, sharp debridement. Instrumentation: Nail nipper, rotary burr. Number of Nails: 10  Procedures and Treatment: Consent by patient was obtained for treatment procedures. The patient understood the discussion of treatment and procedures well. All questions were answered thoroughly reviewed. Debridement of mycotic and hypertrophic toenails, 1 through 5 bilateral and clearing of subungual debris. No ulceration, no infection noted.  Return Visit-Office Procedure: Patient instructed to return to the office for a  follow up visit 3 months for continued evaluation and treatment.  Boneta Lucks, DPM    No follow-ups on file.

## 2019-11-15 ENCOUNTER — Telehealth: Payer: Self-pay | Admitting: Family Medicine

## 2019-11-15 NOTE — Telephone Encounter (Signed)
Copied from [place] [phone]. Topic: Medicare AWV >> Nov 15, 2019  1:11 PM Cher Nakai R wrote: Reason for CRM:  Left message for patient to call back and schedule the Medicare Annual Wellness Visit (AWV) virtually.  Last AWV 11/16/2018  Please schedule at anytime with CFP-Nurse Health Advisor.  45 minute appointment  Any questions, please call me at [phone]

## 2019-11-29 ENCOUNTER — Encounter: Payer: Self-pay | Admitting: Family Medicine

## 2019-11-29 ENCOUNTER — Ambulatory Visit (INDEPENDENT_AMBULATORY_CARE_PROVIDER_SITE_OTHER): Payer: PPO | Admitting: Family Medicine

## 2019-11-29 ENCOUNTER — Other Ambulatory Visit: Payer: Self-pay

## 2019-11-29 VITALS — BP 164/80 | HR 71 | Temp 97.9°F | Ht 63.0 in | Wt 150.4 lb

## 2019-11-29 DIAGNOSIS — E039 Hypothyroidism, unspecified: Secondary | ICD-10-CM | POA: Diagnosis not present

## 2019-11-29 DIAGNOSIS — N183 Chronic kidney disease, stage 3 unspecified: Secondary | ICD-10-CM

## 2019-11-29 DIAGNOSIS — Z0000 Encounter for general adult medical examination without abnormal findings: Secondary | ICD-10-CM | POA: Diagnosis not present

## 2019-11-29 DIAGNOSIS — I10 Essential (primary) hypertension: Secondary | ICD-10-CM | POA: Diagnosis not present

## 2019-11-29 DIAGNOSIS — R351 Nocturia: Secondary | ICD-10-CM | POA: Diagnosis not present

## 2019-11-29 DIAGNOSIS — E785 Hyperlipidemia, unspecified: Secondary | ICD-10-CM | POA: Diagnosis not present

## 2019-11-29 DIAGNOSIS — Z23 Encounter for immunization: Secondary | ICD-10-CM | POA: Diagnosis not present

## 2019-11-29 DIAGNOSIS — I129 Hypertensive chronic kidney disease with stage 1 through stage 4 chronic kidney disease, or unspecified chronic kidney disease: Secondary | ICD-10-CM | POA: Diagnosis not present

## 2019-11-29 LAB — MICROALBUMIN, URINE WAIVED
Creatinine, Urine Waived: 50 mg/dL (ref 10–300)
Microalb, Ur Waived: 10 mg/L (ref 0–19)
Microalb/Creat Ratio: <30 mg/g (ref <30)

## 2019-11-29 LAB — URINALYSIS, ROUTINE W REFLEX MICROSCOPIC
Bilirubin, UA: NEGATIVE
Glucose, UA: NEGATIVE
Ketones, UA: NEGATIVE
Leukocytes,UA: NEGATIVE
Nitrite, UA: NEGATIVE
Protein,UA: NEGATIVE
Specific Gravity, UA: 1.01 (ref 1.005–1.030)
Urobilinogen, Ur: 0.2 mg/dL (ref 0.2–1.0)
pH, UA: 7 (ref 5.0–7.5)

## 2019-11-29 LAB — MICROSCOPIC EXAMINATION
Bacteria, UA: NONE SEEN
WBC, UA: NONE SEEN /hpf (ref 0–5)

## 2019-11-29 MED ORDER — TRIAMCINOLONE ACETONIDE 0.5 % EX OINT
1.0000 | TOPICAL_OINTMENT | Freq: Two times a day (BID) | CUTANEOUS | 0 refills | Status: DC
Start: 2019-11-29 — End: 2020-03-10

## 2019-11-29 MED ORDER — BENAZEPRIL HCL 40 MG PO TABS: 40.0000 mg | ORAL_TABLET | Freq: Every day | ORAL | 1 refills | Status: DC

## 2019-11-29 MED ORDER — HYDROCHLOROTHIAZIDE 25 MG PO TABS: 25.0000 mg | ORAL_TABLET | Freq: Every day | ORAL | 1 refills | Status: DC

## 2019-11-29 NOTE — Progress Notes (Signed)
BP (!) 164/80 (BP Location: Left Arm, Cuff Size: Normal)   Pulse 71   Temp 97.9 F (36.6 C) (Oral)   Ht 5\' 3"  (1.6 m)   Wt 150 lb 6.4 oz (68.2 kg)   LMP 05/13/1980   SpO2 96%   BMI 26.64 kg/m    Subjective:    Patient ID: Cassie Brewer, female    DOB: [birth year], 86 y.o.   MRN: [id number]  HPI: Cassie Brewer is a 86 y.o. female presenting on 11/29/2019 for comprehensive medical examination. Current medical complaints include:  HYPERTENSION / HYPERLIPIDEMIA Satisfied with current treatment? yes Duration of hypertension: chronic BP monitoring frequency: a few times a week BP range: 140s/70s BP medication side effects: no Past BP meds: benazepril, HCTZ Duration of hyperlipidemia: chronic Cholesterol medication side effects: no Cholesterol supplements: fish oil Past cholesterol medications: none Medication compliance: excellent compliance Aspirin: no Recent stressors: no Recurrent headaches: no Visual changes: no Palpitations: no Dyspnea: no Chest pain: no Lower extremity edema: yes Dizzy/lightheaded: no  HYPOTHYROIDISM Thyroid control status:unknown Satisfied with current treatment? no Medication side effects: no Medication compliance: excellent compliance Etiology of hypothyroidism:  Recent dose adjustment:no Fatigue: yes Cold intolerance: no Heat intolerance: no Weight gain: no Weight loss: no Constipation: no Diarrhea/loose stools: no Palpitations: no Lower extremity edema: yes Anxiety/depressed mood: no  Menopausal Symptoms: no  Functional Status Survey: Is the patient deaf or have difficulty hearing?: Yes Does the patient have difficulty seeing, even when wearing glasses/contacts?: No Does the patient have difficulty concentrating, remembering, or making decisions?: No Does the patient have difficulty walking or climbing stairs?: Yes Does the patient have difficulty dressing or bathing?: No Does the patient have difficulty doing errands alone such  as visiting a doctor's office or shopping?: No  Fall Risk  11/29/2019 11/27/2018 11/16/2018 11/14/2017 11/03/2016  Falls in the past year? 0 0 0 No Yes  Number falls in past yr: 0 0 - - 2 or more  Injury with Fall? 0 0 - - No  Follow up - - - - Falls prevention discussed    Depression Screen Depression screen [facility] 2/9 11/29/2019 11/27/2018 11/16/2018 11/14/2017 11/03/2016  Decreased Interest 1 1 0 0 0  Down, Depressed, Hopeless 0 0 1 0 0  PHQ - 2 Score 1 1 1  0 0  Altered sleeping 0 0 - - -  Tired, decreased energy 1 2 - - -  Change in appetite 0 0 - - -  Feeling bad or failure about yourself  0 0 - - -  Trouble concentrating 1 0 - - -  Moving slowly or fidgety/restless 0 0 - - -  Suicidal thoughts 0 0 - - -  PHQ-9 Score 3 3 - - -  Difficult doing work/chores Not difficult at all Not difficult at all - - -    Past Medical History:  Past Medical History:  Diagnosis Date  . Glaucoma   . Hyperlipidemia   . Hypertension   . Hypothyroidism 10/02/2014  . Overactive bladder   . Squamous cell skin cancer, face     Surgical History:  Past Surgical History:  Procedure Laterality Date  . ABDOMINAL HYSTERECTOMY    . BREAST SURGERY    . THYROIDECTOMY, PARTIAL Right     Medications:  Current Outpatient Medications on File Prior to Visit  Medication Sig  . Calcium Carbonate-Vit D-Min (CALCIUM 600+D PLUS MINERALS) 600-400 MG-UNIT TABS   . famotidine (PEPCID) 40 MG tablet TAKE 1 TABLET BY MOUTH  EVERYDAY AT BEDTIME  . fluticasone (FLONASE) 50 MCG/ACT nasal spray SPRAY 2 SPRAYS INTO EACH NOSTRIL EVERY DAY  . latanoprost (XALATAN) 0.005 % ophthalmic solution Place 1 drop into both eyes at bedtime.  . Multiple Vitamins-Minerals (CENTRUM SILVER ULTRA WOMENS PO) Take by mouth.  . Omega-3 Fatty Acids (FISH OIL) 1000 MG CAPS Take by mouth daily.  . timolol (TIMOPTIC) 0.5 % ophthalmic solution   . vitamin C (ASCORBIC ACID) 500 MG tablet Take 500 mg by mouth daily.  . clotrimazole-betamethasone (LOTRISONE)  cream Apply 1 application topically 2 (two) times daily. (Patient not taking: Reported on 11/29/2019)   No current facility-administered medications on file prior to visit.    Allergies:  Allergies  Allergen Reactions  . Amoxicillin     Other reaction(s): Other (See Comments) "blood pressure very high - almost passed out"  . Eryc [Erythromycin] Other (See Comments)  . Sulfa Antibiotics Nausea And Vomiting    Nausea with bactrim in 2017  Nausea with bactrim in 2017     Social History:  Social History   Socioeconomic History  . Marital status: Widowed    Spouse name: Not on file  . Number of children: Not on file  . Years of education: Not on file  . Highest education level: Some college, no degree  Occupational History  . Not on file  Tobacco Use  . Smoking status: Never Smoker  . Smokeless tobacco: Never Used  Vaping Use  . Vaping Use: Never used  Substance and Sexual Activity  . Alcohol use: No  . Drug use: No  . Sexual activity: Not Currently    Birth control/protection: Surgical  Other Topics Concern  . Not on file  Social History Narrative  . Not on file   Social Determinants of Health   Financial Resource Strain: Low Risk   . Difficulty of Paying Living Expenses: Not hard at all  Food Insecurity: No Food Insecurity  . Worried About Charity fundraiser in the Last Year: Never true  . Ran Out of Food in the Last Year: Never true  Transportation Needs: No Transportation Needs  . Lack of Transportation (Medical): No  . Lack of Transportation (Non-Medical): No  Physical Activity: Insufficiently Active  . Days of Exercise per Week: 2 days  . Minutes of Exercise per Session: 20 min  Stress: No Stress Concern Present  . Feeling of Stress : Not at all  Social Connections: Moderately Integrated  . Frequency of Communication with Friends and Family: More than three times a week  . Frequency of Social Gatherings with Friends and Family: More than three times a week    . Attends Religious Services: More than 4 times per year  . Active Member of Clubs or Organizations: Yes  . Attends Archivist Meetings: More than 4 times per year  . Marital Status: Widowed  Intimate Partner Violence: Not At Risk  . Fear of Current or Ex-Partner: No  . Emotionally Abused: No  . Physically Abused: No  . Sexually Abused: No   Social History   Tobacco Use  Smoking Status Never Smoker  Smokeless Tobacco Never Used   Social History   Substance and Sexual Activity  Alcohol Use No    Family History:  Family History  Problem Relation Age of Onset  . Hypertension Mother   . Diabetes Father   . Colon cancer Father   . Breast cancer Grandchild     Past medical history, surgical history, medications, allergies,  family history and social history reviewed with patient today and changes made to appropriate areas of the chart.   Review of Systems  Constitutional: Negative.   HENT: Positive for sore throat. Negative for congestion, ear discharge, ear pain, hearing loss, nosebleeds, sinus pain and tinnitus.   Eyes: Negative.   Respiratory: Positive for cough and shortness of breath. Negative for hemoptysis, sputum production, wheezing and stridor.   Cardiovascular: Positive for leg swelling. Negative for chest pain, palpitations, orthopnea, claudication and PND.  Gastrointestinal: Positive for heartburn. Negative for abdominal pain, blood in stool, constipation, diarrhea, melena, nausea and vomiting.  Genitourinary: Negative.   Musculoskeletal: Negative.   Skin: Negative.   Neurological: Negative.   Endo/Heme/Allergies: Positive for environmental allergies. Negative for polydipsia. Bruises/bleeds easily.  Psychiatric/Behavioral: Negative.     All other ROS negative except what is listed above and in the HPI.      Objective:    BP (!) 164/80 (BP Location: Left Arm, Cuff Size: Normal)   Pulse 71   Temp 97.9 F (36.6 C) (Oral)   Ht 5\' 3"  (1.6 m)   Wt  150 lb 6.4 oz (68.2 kg)   LMP 05/13/1980   SpO2 96%   BMI 26.64 kg/m   Wt Readings from Last 3 Encounters:  11/29/19 150 lb 6.4 oz (68.2 kg)  05/29/19 152 lb (68.9 kg)  02/07/19 151 lb 9.6 oz (68.8 kg)    Physical Exam Vitals and nursing note reviewed.  Constitutional:      General: She is not in acute distress.    Appearance: Normal appearance. She is not ill-appearing, toxic-appearing or diaphoretic.  HENT:     Head: Normocephalic and atraumatic.     Right Ear: Tympanic membrane, ear canal and external ear normal. There is no impacted cerumen.     Left Ear: Tympanic membrane, ear canal and external ear normal. There is no impacted cerumen.     Nose: Nose normal. No congestion or rhinorrhea.     Mouth/Throat:     Mouth: Mucous membranes are moist.     Pharynx: Oropharynx is clear. No oropharyngeal exudate or posterior oropharyngeal erythema.  Eyes:     General: No scleral icterus.       Right eye: No discharge.        Left eye: No discharge.     Extraocular Movements: Extraocular movements intact.     Conjunctiva/sclera: Conjunctivae normal.     Pupils: Pupils are equal, round, and reactive to light.  Neck:     Vascular: No carotid bruit.  Cardiovascular:     Rate and Rhythm: Normal rate and regular rhythm.     Pulses: Normal pulses.     Heart sounds: No murmur heard.  No friction rub. No gallop.   Pulmonary:     Effort: Pulmonary effort is normal. No respiratory distress.     Breath sounds: Normal breath sounds. No stridor. No wheezing, rhonchi or rales.  Chest:     Chest wall: No tenderness.  Abdominal:     General: Abdomen is flat. Bowel sounds are normal. There is no distension.     Palpations: Abdomen is soft. There is no mass.     Tenderness: There is no abdominal tenderness. There is no right CVA tenderness, left CVA tenderness, guarding or rebound.     Hernia: No hernia is present.  Genitourinary:    Comments: Breast and pelvic exams deferred with shared  decision making Musculoskeletal:        General: No  swelling, tenderness, deformity or signs of injury.     Cervical back: Normal range of motion and neck supple. No rigidity. No muscular tenderness.     Right lower leg: No edema.     Left lower leg: No edema.  Lymphadenopathy:     Cervical: No cervical adenopathy.  Skin:    General: Skin is warm and dry.     Capillary Refill: Capillary refill takes less than 2 seconds.     Coloration: Skin is not jaundiced or pale.     Findings: No bruising, erythema, lesion or rash.  Neurological:     General: No focal deficit present.     Mental Status: She is alert and oriented to person, place, and time. Mental status is at baseline.     Cranial Nerves: No cranial nerve deficit.     Sensory: No sensory deficit.     Motor: No weakness.     Coordination: Coordination normal.     Gait: Gait normal.     Deep Tendon Reflexes: Reflexes normal.  Psychiatric:        Mood and Affect: Mood normal.        Behavior: Behavior normal.        Thought Content: Thought content normal.        Judgment: Judgment normal.      Results for orders placed or performed in visit on 11/29/19  Microscopic Examination   Urine  Result Value Ref Range   WBC, UA None seen 0 - 5 /hpf   RBC 0-2 0 - 2 /hpf   Epithelial Cells (non renal) 0-10 0 - 10 /hpf   Bacteria, UA None seen None seen/Few  CBC with Differential/Platelet  Result Value Ref Range   WBC 5.8 3.4 - 10.8 x10E3/uL   RBC 4.34 3.77 - 5.28 x10E6/uL   Hemoglobin 12.9 11.1 - 15.9 g/dL   Hematocrit 38.2 34.0 - 46.6 %   MCV 88 79 - 97 fL   MCH 29.7 26.6 - 33.0 pg   MCHC 33.8 31 - 35 g/dL   RDW 13.2 11.7 - 15.4 %   Platelets 302 150 - 450 x10E3/uL   Neutrophils 60 Not Estab. %   Lymphs 26 Not Estab. %   Monocytes 10 Not Estab. %   Eos 3 Not Estab. %   Basos 1 Not Estab. %   Neutrophils Absolute 3.5 1 - 7 x10E3/uL   Lymphocytes Absolute 1.5 0 - 3 x10E3/uL   Monocytes Absolute 0.6 0 - 0 x10E3/uL   EOS  (ABSOLUTE) 0.2 0.0 - 0.4 x10E3/uL   Basophils Absolute 0.1 0 - 0 x10E3/uL   Immature Granulocytes 0 Not Estab. %   Immature Grans (Abs) 0.0 0.0 - 0.1 x10E3/uL  Comprehensive metabolic panel  Result Value Ref Range   Glucose 111 (H) 65 - 99 mg/dL   BUN 22 8 - 27 mg/dL   Creatinine, Ser 1.05 (H) 0.57 - 1.00 mg/dL   GFR calc non Af Amer 47 (L) >59 mL/min/1.73   GFR calc Af Amer 54 (L) >59 mL/min/1.73   BUN/Creatinine Ratio 21 12 - 28   Sodium 140 134 - 144 mmol/L   Potassium 4.1 3.5 - 5.2 mmol/L   Chloride 102 96 - 106 mmol/L   CO2 25 20 - 29 mmol/L   Calcium 9.5 8.7 - 10.3 mg/dL   Total Protein 7.5 6.0 - 8.5 g/dL   Albumin 4.7 (H) 3.6 - 4.6 g/dL   Globulin, Total 2.8 1.5 - 4.5 g/dL  Albumin/Globulin Ratio 1.7 1.2 - 2.2   Bilirubin Total 0.5 0.0 - 1.2 mg/dL   Alkaline Phosphatase 57 48 - 121 IU/L   AST 16 0 - 40 IU/L   ALT 11 0 - 32 IU/L  Lipid Panel w/o Chol/HDL Ratio  Result Value Ref Range   Cholesterol, Total 260 (H) 100 - 199 mg/dL   Triglycerides 103 0 - 149 mg/dL   HDL 69 >39 mg/dL   VLDL Cholesterol Cal 18 5 - 40 mg/dL   LDL Chol Calc (NIH) 173 (H) 0 - 99 mg/dL  Microalbumin, Urine Waived  Result Value Ref Range   Microalb, Ur Waived 10 0 - 19 mg/L   Creatinine, Urine Waived 50 10 - 300 mg/dL   Microalb/Creat Ratio <30 <30 mg/g  TSH  Result Value Ref Range   TSH 1.020 0.450 - 4.500 uIU/mL  Urinalysis, Routine w reflex microscopic  Result Value Ref Range   Specific Gravity, UA 1.010 1.005 - 1.030   pH, UA 7.0 5.0 - 7.5   Color, UA Yellow Yellow   Appearance Ur Clear Clear   Leukocytes,UA Negative Negative   Protein,UA Negative Negative/Trace   Glucose, UA Negative Negative   Ketones, UA Negative Negative   RBC, UA Trace (A) Negative   Bilirubin, UA Negative Negative   Urobilinogen, Ur 0.2 0.2 - 1.0 mg/dL   Nitrite, UA Negative Negative   Microscopic Examination See below:       Assessment & Plan:   Problem List Items Addressed This Visit       Endocrine   Hypothyroidism    Rechecking levels today. Await results. Treat as needed.       Relevant Medications   levothyroxine (SYNTHROID) 112 MCG tablet   Other Relevant Orders   Comprehensive metabolic panel (Completed)   TSH (Completed)     Genitourinary   Benign hypertensive renal disease    Under good control on current regimen. Continue current regimen. Continue to monitor. Call with any concerns. Refills given. Labs drawn today.       Relevant Medications   hydrochlorothiazide (HYDRODIURIL) 25 MG tablet   benazepril (LOTENSIN) 40 MG tablet   Other Relevant Orders   CBC with Differential/Platelet (Completed)   Comprehensive metabolic panel (Completed)   Microalbumin, Urine Waived (Completed)   CKD (chronic kidney disease), stage III    Rechecking levels today. Await results. Treat as needed.       Relevant Orders   CBC with Differential/Platelet (Completed)   Comprehensive metabolic panel (Completed)     Other   Hyperlipidemia    Risks outweigh the benefits of treatment. Continue to monitor. Labs drawn today. Await results.       Relevant Medications   hydrochlorothiazide (HYDRODIURIL) 25 MG tablet   benazepril (LOTENSIN) 40 MG tablet   Other Relevant Orders   Comprehensive metabolic panel (Completed)   Lipid Panel w/o Chol/HDL Ratio (Completed)    Other Visit Diagnoses    Routine general medical examination at a health care facility    -  Primary   Vaccines up to date. Screening labs checked today. DEXA up to date. Continue diet and exercise. Call with any concerns.   Essential hypertension       Relevant Medications   hydrochlorothiazide (HYDRODIURIL) 25 MG tablet   benazepril (LOTENSIN) 40 MG tablet   Nocturia       Relevant Orders   Urinalysis, Routine w reflex microscopic (Completed)   Flu vaccine need       Flu  shot given today.    Relevant Orders   Flu Vaccine QUAD High Dose(Fluad) (Completed)       Preventative Services:  Health Risk  Assessment and Personalized Prevention Plan: Done today Bone Mass Measurements: up to date Breast Cancer Screening: N/A CVD Screening: Done today Cervical Cancer Screening: N/A Colon Cancer Screening: N/A Depression Screening: Done today Diabetes Screening: Done today Glaucoma Screening: See your eye doctor Hepatitis B vaccine: N/A Hepatitis C screening: Up to date HIV Screening: Up to date Flu Vaccine: Given today Lung cancer Screening: N/A Obesity Screening: Done today Pneumonia Vaccines (2): Up to date STI Screening: N/A  Follow up plan: Return in about 6 months (around 05/28/2020).   LABORATORY TESTING:  - Pap smear: not applicable  IMMUNIZATIONS:   - Tdap: Tetanus vaccination status reviewed: last tetanus booster within 10 years. - Influenza: Administered today - Pneumovax: Up to date - Prevnar: Up to date - COVID vaccine: Up to date  SCREENING: -Mammogram: Not applicable  - Colonoscopy: Not applicable  - Bone Density: Up to date   PATIENT COUNSELING:   Advised to take 1 mg of folate supplement per day if capable of pregnancy.   Sexuality: Discussed sexually transmitted diseases, partner selection, use of condoms, avoidance of unintended pregnancy  and contraceptive alternatives.   Advised to avoid cigarette smoking.  I discussed with the patient that most people either abstain from alcohol or drink within safe limits (<=14/week and <=4 drinks/occasion for males, <=7/weeks and <= 3 drinks/occasion for females) and that the risk for alcohol disorders and other health effects rises proportionally with the number of drinks per week and how often a drinker exceeds daily limits.  Discussed cessation/primary prevention of drug use and availability of treatment for abuse.   Diet: Encouraged to adjust caloric intake to maintain  or achieve ideal body weight, to reduce intake of dietary saturated fat and total fat, to limit sodium intake by avoiding high sodium foods and not  adding table salt, and to maintain adequate dietary potassium and calcium preferably from fresh fruits, vegetables, and low-fat dairy products.    stressed the importance of regular exercise  Injury prevention: Discussed safety belts, safety helmets, smoke detector, smoking near bedding or upholstery.   Dental health: Discussed importance of regular tooth brushing, flossing, and dental visits.    NEXT PREVENTATIVE PHYSICAL DUE IN 1 YEAR. Return in about 6 months (around 05/28/2020).

## 2019-11-29 NOTE — Patient Instructions (Addendum)
Preventative Services:  Health Risk Assessment and Personalized Prevention Plan: Done today Bone Mass Measurements: up to date Breast Cancer Screening: N/A CVD Screening: Done today Cervical Cancer Screening: N/A Colon Cancer Screening: N/A Depression Screening: Done today Diabetes Screening: Done today Glaucoma Screening: See your eye doctor Hepatitis B vaccine: N/A Hepatitis C screening: Up to date HIV Screening: Up to date Flu Vaccine: Given today Lung cancer Screening: N/A Obesity Screening: Done today Pneumonia Vaccines (2): Up to date STI Screening: N/A   Health Maintenance After Age 60 After age 44, you are at a higher risk for certain long-term diseases and infections as well as injuries from falls. Falls are a major cause of broken bones and head injuries in people who are older than age 68. Getting regular preventive care can help to keep you healthy and well. Preventive care includes getting regular testing and making lifestyle changes as recommended by your health care provider. Talk with your health care provider about:  Which screenings and tests you should have. A screening is a test that checks for a disease when you have no symptoms.  A diet and exercise plan that is right for you. What should I know about screenings and tests to prevent falls? Screening and testing are the best ways to find a health problem early. Early diagnosis and treatment give you the best chance of managing medical conditions that are common after age 10. Certain conditions and lifestyle choices may make you more likely to have a fall. Your health care provider may recommend:  Regular vision checks. Poor vision and conditions such as cataracts can make you more likely to have a fall. If you wear glasses, make sure to get your prescription updated if your vision changes.  Medicine review. Work with your health care provider to regularly review all of the medicines you are taking, including  over-the-counter medicines. Ask your health care provider about any side effects that may make you more likely to have a fall. Tell your health care provider if any medicines that you take make you feel dizzy or sleepy.  Osteoporosis screening. Osteoporosis is a condition that causes the bones to get weaker. This can make the bones weak and cause them to break more easily.  Blood pressure screening. Blood pressure changes and medicines to control blood pressure can make you feel dizzy.  Strength and balance checks. Your health care provider may recommend certain tests to check your strength and balance while standing, walking, or changing positions.  Foot health exam. Foot pain and numbness, as well as not wearing proper footwear, can make you more likely to have a fall.  Depression screening. You may be more likely to have a fall if you have a fear of falling, feel emotionally low, or feel unable to do activities that you used to do.  Alcohol use screening. Using too much alcohol can affect your balance and may make you more likely to have a fall. What actions can I take to lower my risk of falls? General instructions  Talk with your health care provider about your risks for falling. Tell your health care provider if: ? You fall. Be sure to tell your health care provider about all falls, even ones that seem minor. ? You feel dizzy, sleepy, or off-balance.  Take over-the-counter and prescription medicines only as told by your health care provider. These include any supplements.  Eat a healthy diet and maintain a healthy weight. A healthy diet includes low-fat dairy products, low-fat (  lean) meats, and fiber from whole grains, beans, and lots of fruits and vegetables. Home safety  Remove any tripping hazards, such as rugs, cords, and clutter.  Install safety equipment such as grab bars in bathrooms and safety rails on stairs.  Keep rooms and walkways well-lit. Activity   Follow a  regular exercise program to stay fit. This will help you maintain your balance. Ask your health care provider what types of exercise are appropriate for you.  If you need a cane or walker, use it as recommended by your health care provider.  Wear supportive shoes that have nonskid soles. Lifestyle  Do not drink alcohol if your health care provider tells you not to drink.  If you drink alcohol, limit how much you have: ? 0-1 drink a day for women. ? 0-2 drinks a day for men.  Be aware of how much alcohol is in your drink. In the [place] one drink equals one typical bottle of beer (12 oz), one-half glass of wine (5 oz), or one shot of hard liquor (1 oz).  Do not use any products that contain nicotine or tobacco, such as cigarettes and e-cigarettes. If you need help quitting, ask your health care provider. Summary  Having a healthy lifestyle and getting preventive care can help to protect your health and wellness after age 30.  Screening and testing are the best way to find a health problem early and help you avoid having a fall. Early diagnosis and treatment give you the best chance for managing medical conditions that are more common for people who are older than age 70.  Falls are a major cause of broken bones and head injuries in people who are older than age 20. Take precautions to prevent a fall at home.  Work with your health care provider to learn what changes you can make to improve your health and wellness and to prevent falls. This information is not intended to replace advice given to you by your health care provider. Make sure you discuss any questions you have with your health care provider. Document Revised: 07/06/2018 Document Reviewed: 01/26/2017 Elsevier Patient Education  2020 [employer].

## 2019-11-30 LAB — LIPID PANEL W/O CHOL/HDL RATIO
Cholesterol, Total: 260 mg/dL — ABNORMAL HIGH (ref 100–199)
HDL: 69 mg/dL (ref >39)
LDL Chol Calc (NIH): 173 mg/dL — ABNORMAL HIGH (ref 0–99)
Triglycerides: 103 mg/dL (ref 0–149)
VLDL Cholesterol Cal: 18 mg/dL (ref 5–40)

## 2019-11-30 LAB — CBC WITH DIFFERENTIAL/PLATELET
Basophils Absolute: 0.1 10*3/uL (ref 0.0–0.2)
Basos: 1 %
EOS (ABSOLUTE): 0.2 10*3/uL (ref 0.0–0.4)
Eos: 3 %
Hematocrit: 38.2 % (ref 34.0–46.6)
Hemoglobin: 12.9 g/dL (ref 11.1–15.9)
Immature Grans (Abs): 0 10*3/uL (ref 0.0–0.1)
Immature Granulocytes: 0 %
Lymphocytes Absolute: 1.5 10*3/uL (ref 0.7–3.1)
Lymphs: 26 %
MCH: 29.7 pg (ref 26.6–33.0)
MCHC: 33.8 g/dL (ref 31.5–35.7)
MCV: 88 fL (ref 79–97)
Monocytes Absolute: 0.6 10*3/uL (ref 0.1–0.9)
Monocytes: 10 %
Neutrophils Absolute: 3.5 10*3/uL (ref 1.4–7.0)
Neutrophils: 60 %
Platelets: 302 10*3/uL (ref 150–450)
RBC: 4.34 x10E6/uL (ref 3.77–5.28)
RDW: 13.2 % (ref 11.7–15.4)
WBC: 5.8 10*3/uL (ref 3.4–10.8)

## 2019-11-30 LAB — COMPREHENSIVE METABOLIC PANEL
ALT: 11 IU/L (ref 0–32)
AST: 16 IU/L (ref 0–40)
Albumin/Globulin Ratio: 1.7 (ref 1.2–2.2)
Albumin: 4.7 g/dL — ABNORMAL HIGH (ref 3.6–4.6)
Alkaline Phosphatase: 57 IU/L (ref 48–121)
BUN/Creatinine Ratio: 21 (ref 12–28)
BUN: 22 mg/dL (ref 8–27)
Bilirubin Total: 0.5 mg/dL (ref 0.0–1.2)
CO2: 25 mmol/L (ref 20–29)
Calcium: 9.5 mg/dL (ref 8.7–10.3)
Chloride: 102 mmol/L (ref 96–106)
Creatinine, Ser: 1.05 mg/dL — ABNORMAL HIGH (ref 0.57–1.00)
GFR calc Af Amer: 54 mL/min/{1.73_m2} — ABNORMAL LOW (ref >59)
GFR calc non Af Amer: 47 mL/min/{1.73_m2} — ABNORMAL LOW (ref >59)
Globulin, Total: 2.8 g/dL (ref 1.5–4.5)
Glucose: 111 mg/dL — ABNORMAL HIGH (ref 65–99)
Potassium: 4.1 mmol/L (ref 3.5–5.2)
Sodium: 140 mmol/L (ref 134–144)
Total Protein: 7.5 g/dL (ref 6.0–8.5)

## 2019-11-30 LAB — TSH: TSH: 1.02 u[IU]/mL (ref 0.450–4.500)

## 2019-12-04 ENCOUNTER — Encounter: Payer: Self-pay | Admitting: Family Medicine

## 2019-12-04 MED ORDER — LEVOTHYROXINE SODIUM 112 MCG PO TABS: 112.0000 ug | ORAL_TABLET | Freq: Every day | ORAL | 3 refills | Status: DC

## 2019-12-04 NOTE — Assessment & Plan Note (Signed)
Rechecking levels today. Await results. Treat as needed.  

## 2019-12-04 NOTE — Assessment & Plan Note (Signed)
Risks outweigh the benefits of treatment. Continue to monitor. Labs drawn today. Await results.

## 2019-12-04 NOTE — Assessment & Plan Note (Signed)
Under good control on current regimen. Continue current regimen. Continue to monitor. Call with any concerns. Refills given. Labs drawn today.   

## 2020-01-08 ENCOUNTER — Telehealth: Payer: Self-pay | Admitting: General Practice

## 2020-01-08 ENCOUNTER — Telehealth: Payer: Self-pay

## 2020-01-08 NOTE — Telephone Encounter (Signed)
°  Chronic Care Management   Outreach Note  01/08/2020 Name: Cassie Brewer MRN: [id number] DOB: 10-May-1928  Referred by: Valerie Roys, DO Reason for referral : Chronic Care Management (RNCM: Chronic Disease Management and Care Coordination Needs)   An unsuccessful telephone outreach was attempted today. The patient was referred to the case management team for assistance with care management and care coordination.   Follow Up Plan: A HIPAA compliant phone message was left for the patient providing contact information and requesting a return call.   Noreene Larsson RN, MSN, [facility] Mobile: [phone]

## 2020-01-10 IMAGING — CT CT NECK W/ CM
5 series · 16 of 33 positions shown, 18 images · IV contrast (omnipaque)
Comparison: None.

CLINICAL DATA: Neck mass

EXAM:
CT NECK WITH CONTRAST
TECHNIQUE: Multidetector CT imaging of the neck was performed using the
standard protocol following the bolus administration of intravenous
contrast.
CONTRAST:  75mL OMNIPAQUE IOHEXOL 300 MG/ML  SOLN

[Series 2: axial neck neck (person_name) 2.00 · axial · 0.51mm/px · z∈[-674,-550]mm · 3 of 124 slices shown]
[im 31/124  bone]
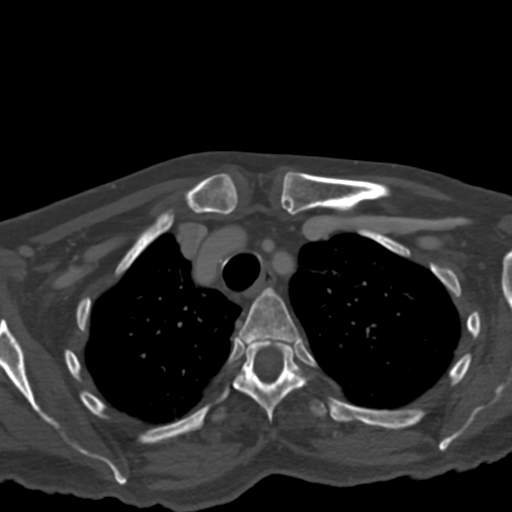
[im 62/124  bone]
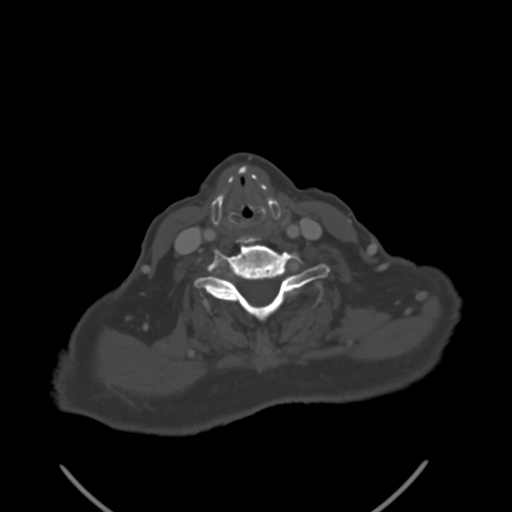
[im 93/124  bone]
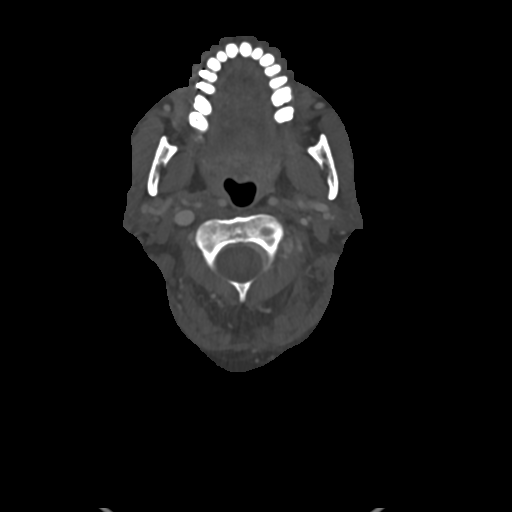

[Series 3: axial bone neck 2.00 · axial · 0.51mm/px · z∈[-652,-570]mm · 2 of 124 slices shown]
[im 42/124  bone]
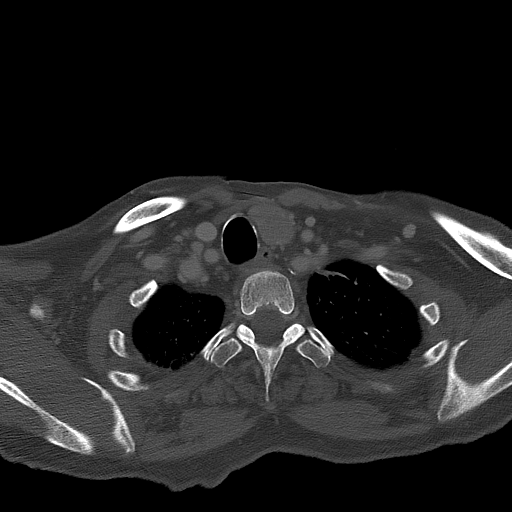
[im 83/124  bone]
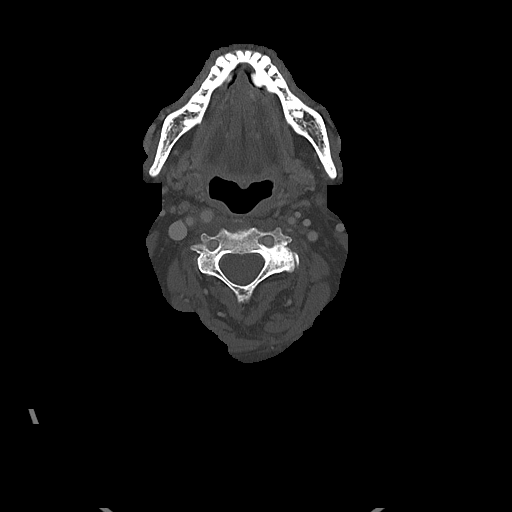

[Series 4: coronal neck neck (person_name) 2.00 cor · coronal · 0.56mm/px · 3 of 131 slices shown]
[im 34/131  bone]
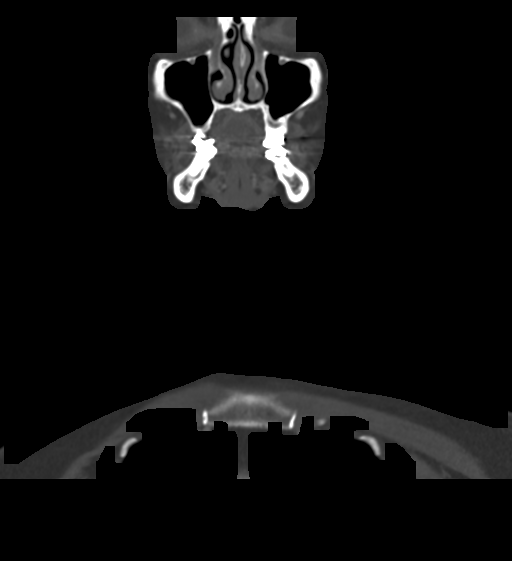
[im 55/131  bone]
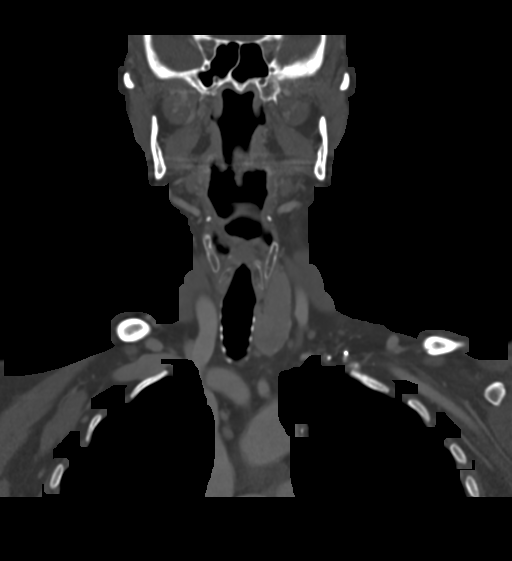
[im 76/131  bone]
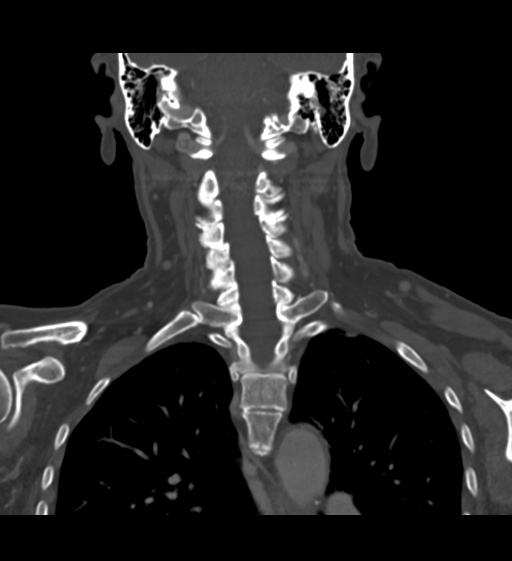

[Series 6: sagittal neck neck (person_name) 2.00 sag · sagittal · 0.51mm/px · 5 of 142 slices shown, 6 images]
[im 48/142  bone]
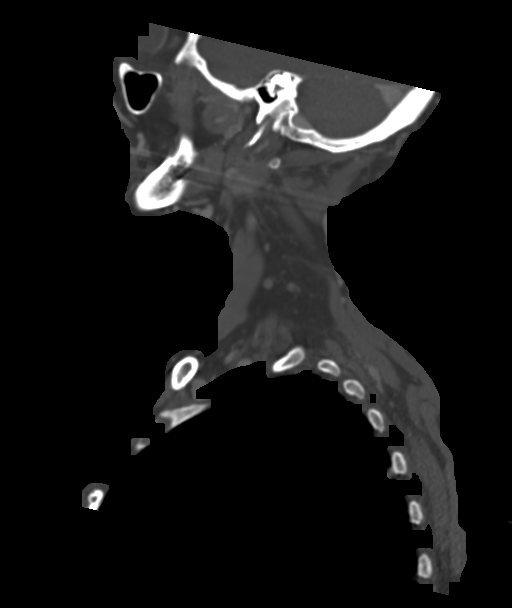
[im 59/142  bone]
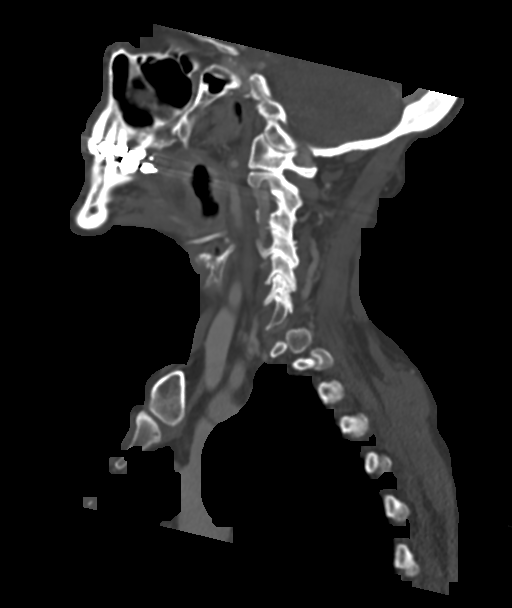
[im 71/142  soft-tissue]
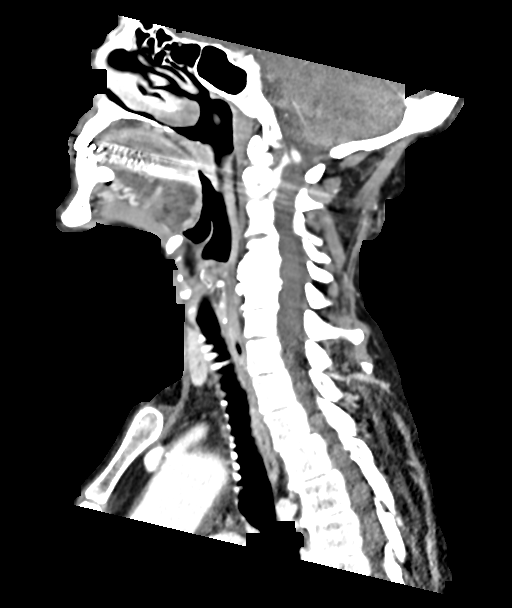
[im 71/142  bone]
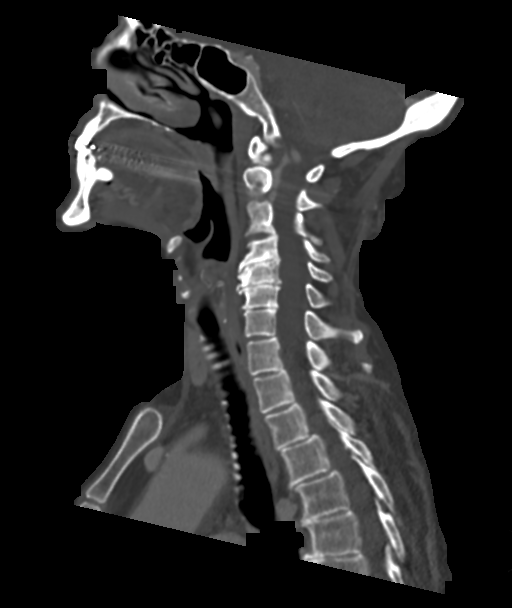
[im 83/142  bone]
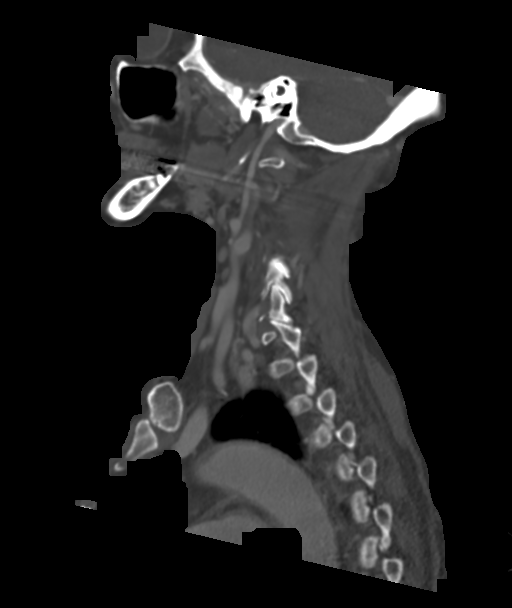
[im 95/142  bone]
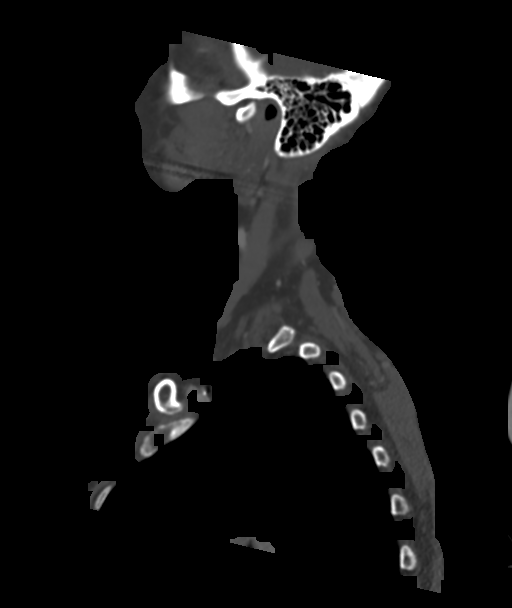

[Series 8: ax oropharynx neck neck (person_name) 2.00 ax · axial · 0.51mm/px · z∈[-722,-570]mm · 3 of 156 slices shown, 4 images]
[im 39/156  soft-tissue]
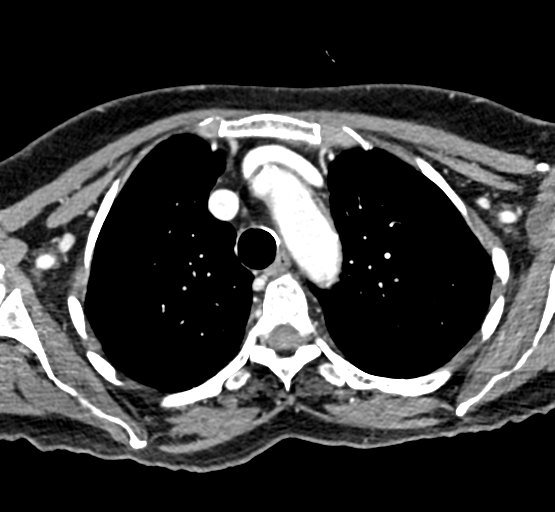
[im 39/156  bone]
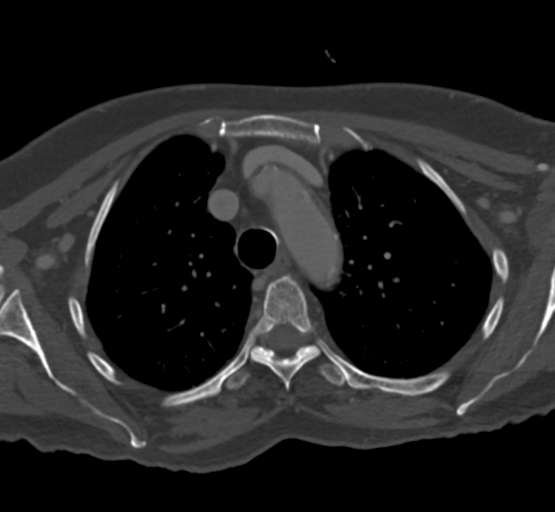
[im 78/156  bone]
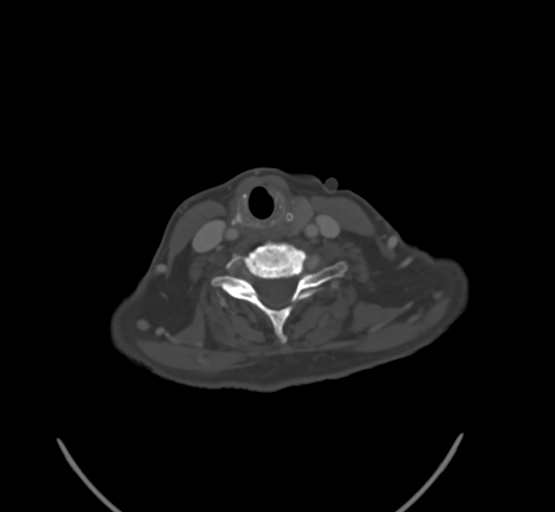
[im 117/156  bone]
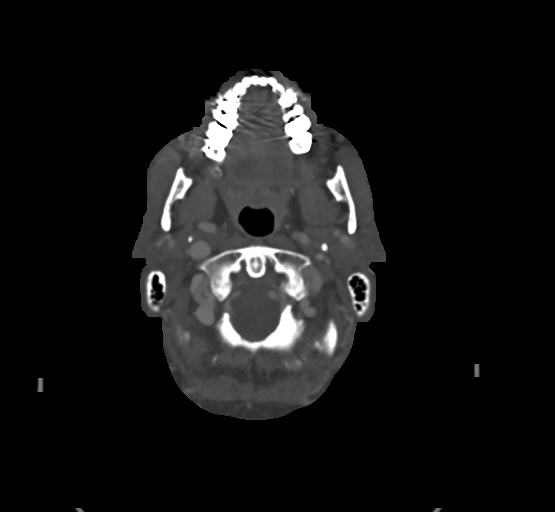

[16 of 33 positions shown; findings below may reference images not displayed]

FINDINGS: Mass: Skin marker in area of concern overlies the anterior left neck
at the level of the thyroid. There is no underlying mass or
adenopathy.

Pharynx and larynx: Unremarkable.  No mass.

Salivary glands: Atrophic submandibular glands. Parotid glands are
unremarkable.

Thyroid: Post right thyroidectomy. Mildly enlarged left thyroid
lobe.

Lymph nodes: There are no enlarged or necrotic lymph nodes.

Vascular: Major neck vessels are patent. Mild calcified plaque is
present at the common carotid bifurcations.

Limited intracranial: No abnormal enhancement.

Visualized orbits: Lens replacements.  Otherwise unremarkable

Mastoids and visualized paranasal sinuses: Trace mucosal thickening.
Mastoid air cells are clear.

Skeleton: Multilevel degenerative changes of the cervical spine.

Upper chest: No apical lung mass.

Other: None.
IMPRESSION: No neck mass or adenopathy. Mildly enlarged left thyroid lobe is in
proximity to the skin marker; unclear if this reflects palpable
abnormality.

## 2020-01-14 ENCOUNTER — Telehealth: Payer: Self-pay

## 2020-01-14 NOTE — Chronic Care Management (AMB) (Signed)
  Care Management   Note  01/14/2020 Name: Cassie Brewer MRN: [id number] DOB: 1928/11/15  Cassie Brewer is a 86 y.o. year old female who is a primary care patient of Valerie Roys, DO and is actively engaged with the care management team. I reached out to Erling Cruz by phone today to assist with re-scheduling a follow up visit with the RN Case Manager  Follow up plan: Unsuccessful telephone outreach attempt made. A HIPAA compliant phone message was left for the patient providing contact information and requesting a return call.  The care management team will reach out to the patient again over the next 7 days.  If patient returns call to provider office, please advise to call [facility]  at [place], [place], [place], [place] [address] Direct Dial: [phone] [email or web address]: [email or web address]

## 2020-01-17 NOTE — Chronic Care Management (AMB) (Signed)
  Care Management   Note  01/17/2020 Name: Cassie Brewer MRN: [id number] DOB: 1928-11-19  Cassie Brewer is a 86 y.o. year old female who is a primary care patient of Valerie Roys, DO and is actively engaged with the care management team. I reached out to Erling Cruz by phone today to assist with re-scheduling a follow up visit with the RN Case Manager  Follow up plan: Telephone appointment with care management team member scheduled for:03/25/2020  Noreene Larsson, [place], [place], [place] [address] Direct Dial: [phone] [email or web address]

## 2020-01-17 NOTE — Telephone Encounter (Signed)
Pt has been r/s 03/25/2020

## 2020-01-21 DIAGNOSIS — H353131 Nonexudative age-related macular degeneration, bilateral, early dry stage: Secondary | ICD-10-CM | POA: Diagnosis not present

## 2020-01-21 DIAGNOSIS — H401131 Primary open-angle glaucoma, bilateral, mild stage: Secondary | ICD-10-CM | POA: Diagnosis not present

## 2020-01-21 DIAGNOSIS — H26493 Other secondary cataract, bilateral: Secondary | ICD-10-CM | POA: Diagnosis not present

## 2020-02-05 ENCOUNTER — Encounter: Payer: Self-pay | Admitting: Podiatry

## 2020-02-05 ENCOUNTER — Other Ambulatory Visit: Payer: Self-pay

## 2020-02-05 ENCOUNTER — Ambulatory Visit: Payer: PPO | Admitting: Podiatry

## 2020-02-05 DIAGNOSIS — M79675 Pain in left toe(s): Secondary | ICD-10-CM

## 2020-02-05 DIAGNOSIS — M79674 Pain in right toe(s): Secondary | ICD-10-CM

## 2020-02-05 DIAGNOSIS — B351 Tinea unguium: Secondary | ICD-10-CM

## 2020-02-06 ENCOUNTER — Other Ambulatory Visit: Payer: Self-pay | Admitting: Gastroenterology

## 2020-02-06 ENCOUNTER — Encounter: Payer: Self-pay | Admitting: Podiatry

## 2020-02-06 NOTE — Progress Notes (Signed)
  Subjective:  Patient ID: Cassie Brewer, female    DOB: [birth year],  MRN: [id number]  Chief Complaint  Patient presents with  . Nail Problem    nail trim RFC, painful corn right 4th toe and callous bottom of left forefoot   86 y.o. female returns for the above complaint.  Patient presents with thickened elongated mycotic toenails x10.  Patient is especially concerned for right hallux onychomycosis.  Patient not able to debride on herself.  She would like for [place] to debride down.  She denies any other acute complaints.  She says there is pain to palpation to the nails Objective:   There were no vitals filed for this visit. Podiatric Exam: Vascular: dorsalis pedis and posterior tibial pulses are palpable bilateral. Capillary return is immediate. Temperature gradient is WNL. Skin turgor WNL  Sensorium: Normal Semmes Weinstein monofilament test. Normal tactile sensation bilaterally.  However she experiences subjective neuropathic pain to the distal tip of the toes.  Intact sensation Nail Exam: Pt has thick disfigured discolored nails with subungual debris noted bilateral entire nail hallux through fifth toenails.  Ingrown noted to the right hallux lateral border.  Mild pain on palpation. Ulcer Exam: There is no evidence of ulcer or pre-ulcerative changes or infection. Orthopedic Exam: Muscle tone and strength are WNL. No limitations in general ROM. No crepitus or effusions noted. HAV  B/L.  Hammer toes 2-5  B/L. Skin: No Porokeratosis. No infection or ulcers.  Xerosis/psoriasis of the plantar foot.  There is mild cracking noted.  No bleeding noted.  Assessment & Plan:  Patient was evaluated and treated and all questions answered.  Right hallux ingrown -I discussed with her various treatment options that were well both to her including performing a slant back procedure.  Patient opted for that procedure as opposed to more aggressive given her age as well as history of circulation issue.  Using  nail nipper, the procedure was carried out in standard technique without any issues.  No complications noted.  Neuropathic pain -I explained to the patient the etiology of neuropathic pain and various treatment options were extensively discussed.  I believe she will benefit from neuropathic cream to help decrease the pain associated with this -Prescription for neuropathic cream was sent to [facility] drug pharmacy.   Xerosis/psoriasis -I explained to the patient the etiology of dryness with cracking as well as various treatment options associated with it.  Given that she is doing good with clotrimazole cream that was given to her by dermatologist and had diagnosed her with psoriasis, I believe she can continue using the cream.  Patient agrees with this plan.  Onychomycosis with pain  -Nails palliatively debrided as below. -Educated on self-care  Procedure: Nail Debridement Rationale: pain  Type of Debridement: manual, sharp debridement. Instrumentation: Nail nipper, rotary burr. Number of Nails: 10  Procedures and Treatment: Consent by patient was obtained for treatment procedures. The patient understood the discussion of treatment and procedures well. All questions were answered thoroughly reviewed. Debridement of mycotic and hypertrophic toenails, 1 through 5 bilateral and clearing of subungual debris. No ulceration, no infection noted.  Return Visit-Office Procedure: Patient instructed to return to the office for a follow up visit 3 months for continued evaluation and treatment.  Boneta Lucks, DPM    No follow-ups on file.

## 2020-03-10 ENCOUNTER — Encounter: Payer: Self-pay | Admitting: Emergency Medicine

## 2020-03-10 ENCOUNTER — Observation Stay: Payer: PPO

## 2020-03-10 ENCOUNTER — Other Ambulatory Visit: Payer: Self-pay

## 2020-03-10 ENCOUNTER — Telehealth: Payer: Self-pay

## 2020-03-10 ENCOUNTER — Emergency Department: Payer: PPO

## 2020-03-10 ENCOUNTER — Observation Stay
Admit: 2020-03-10 | Discharge: 2020-03-10 | Disposition: A | Discharge status: Home / Self Care | Payer: PPO | Attending: Internal Medicine | Admitting: Internal Medicine

## 2020-03-10 ENCOUNTER — Inpatient Hospital Stay
Admission: EM | Admit: 2020-03-10 | Discharge: 2020-03-12 | DRG: 066 | Disposition: A | Discharge status: Home / Self Care | Payer: PPO | Attending: Internal Medicine | Admitting: Internal Medicine

## 2020-03-10 DIAGNOSIS — I6389 Other cerebral infarction: Secondary | ICD-10-CM | POA: Diagnosis not present

## 2020-03-10 DIAGNOSIS — Z90710 Acquired absence of both cervix and uterus: Secondary | ICD-10-CM | POA: Diagnosis not present

## 2020-03-10 DIAGNOSIS — Z881 Allergy status to other antibiotic agents status: Secondary | ICD-10-CM | POA: Diagnosis not present

## 2020-03-10 DIAGNOSIS — I639 Cerebral infarction, unspecified: Secondary | ICD-10-CM | POA: Diagnosis not present

## 2020-03-10 DIAGNOSIS — Z803 Family history of malignant neoplasm of breast: Secondary | ICD-10-CM

## 2020-03-10 DIAGNOSIS — K219 Gastro-esophageal reflux disease without esophagitis: Secondary | ICD-10-CM | POA: Diagnosis present

## 2020-03-10 DIAGNOSIS — E039 Hypothyroidism, unspecified: Secondary | ICD-10-CM | POA: Diagnosis present

## 2020-03-10 DIAGNOSIS — R002 Palpitations: Secondary | ICD-10-CM | POA: Diagnosis not present

## 2020-03-10 DIAGNOSIS — R531 Weakness: Secondary | ICD-10-CM | POA: Diagnosis not present

## 2020-03-10 DIAGNOSIS — H409 Unspecified glaucoma: Secondary | ICD-10-CM | POA: Diagnosis not present

## 2020-03-10 DIAGNOSIS — Z882 Allergy status to sulfonamides status: Secondary | ICD-10-CM

## 2020-03-10 DIAGNOSIS — Z833 Family history of diabetes mellitus: Secondary | ICD-10-CM

## 2020-03-10 DIAGNOSIS — Z8249 Family history of ischemic heart disease and other diseases of the circulatory system: Secondary | ICD-10-CM

## 2020-03-10 DIAGNOSIS — Z79890 Hormone replacement therapy (postmenopausal): Secondary | ICD-10-CM | POA: Diagnosis not present

## 2020-03-10 DIAGNOSIS — I739 Peripheral vascular disease, unspecified: Secondary | ICD-10-CM | POA: Diagnosis not present

## 2020-03-10 DIAGNOSIS — Z888 Allergy status to other drugs, medicaments and biological substances status: Secondary | ICD-10-CM

## 2020-03-10 DIAGNOSIS — G459 Transient cerebral ischemic attack, unspecified: Secondary | ICD-10-CM

## 2020-03-10 DIAGNOSIS — R4701 Aphasia: Secondary | ICD-10-CM | POA: Diagnosis not present

## 2020-03-10 DIAGNOSIS — N1831 Chronic kidney disease, stage 3a: Secondary | ICD-10-CM | POA: Diagnosis not present

## 2020-03-10 DIAGNOSIS — I4510 Unspecified right bundle-branch block: Secondary | ICD-10-CM | POA: Diagnosis present

## 2020-03-10 DIAGNOSIS — N183 Chronic kidney disease, stage 3 unspecified: Secondary | ICD-10-CM | POA: Diagnosis present

## 2020-03-10 DIAGNOSIS — I6523 Occlusion and stenosis of bilateral carotid arteries: Secondary | ICD-10-CM | POA: Diagnosis not present

## 2020-03-10 DIAGNOSIS — Z20822 Contact with and (suspected) exposure to covid-19: Secondary | ICD-10-CM | POA: Diagnosis not present

## 2020-03-10 DIAGNOSIS — I779 Disorder of arteries and arterioles, unspecified: Secondary | ICD-10-CM | POA: Diagnosis not present

## 2020-03-10 DIAGNOSIS — Z800 Family history of malignant neoplasm of digestive organs: Secondary | ICD-10-CM | POA: Diagnosis not present

## 2020-03-10 DIAGNOSIS — Z85828 Personal history of other malignant neoplasm of skin: Secondary | ICD-10-CM

## 2020-03-10 DIAGNOSIS — I129 Hypertensive chronic kidney disease with stage 1 through stage 4 chronic kidney disease, or unspecified chronic kidney disease: Secondary | ICD-10-CM | POA: Diagnosis present

## 2020-03-10 DIAGNOSIS — R29818 Other symptoms and signs involving the nervous system: Secondary | ICD-10-CM | POA: Diagnosis not present

## 2020-03-10 DIAGNOSIS — Z79899 Other long term (current) drug therapy: Secondary | ICD-10-CM | POA: Diagnosis not present

## 2020-03-10 DIAGNOSIS — I69320 Aphasia following cerebral infarction: Secondary | ICD-10-CM | POA: Diagnosis present

## 2020-03-10 DIAGNOSIS — E785 Hyperlipidemia, unspecified: Secondary | ICD-10-CM | POA: Diagnosis present

## 2020-03-10 DIAGNOSIS — I6340 Cerebral infarction due to embolism of unspecified cerebral artery: Principal | ICD-10-CM | POA: Diagnosis present

## 2020-03-10 DIAGNOSIS — R471 Dysarthria and anarthria: Secondary | ICD-10-CM | POA: Diagnosis present

## 2020-03-10 DIAGNOSIS — R4781 Slurred speech: Secondary | ICD-10-CM | POA: Diagnosis not present

## 2020-03-10 HISTORY — DX: Transient cerebral ischemic attack, unspecified: G45.9

## 2020-03-10 LAB — COMPREHENSIVE METABOLIC PANEL
ALT: 14 U/L (ref 0–44)
AST: 17 U/L (ref 15–41)
Albumin: 4 g/dL (ref 3.5–5.0)
Alkaline Phosphatase: 46 U/L (ref 38–126)
Anion gap: 10 (ref 5–15)
BUN: 25 mg/dL — ABNORMAL HIGH (ref 8–23)
CO2: 25 mmol/L (ref 22–32)
Calcium: 9.8 mg/dL (ref 8.9–10.3)
Chloride: 102 mmol/L (ref 98–111)
Creatinine, Ser: 0.98 mg/dL (ref 0.44–1.00)
GFR, Estimated: 55 mL/min — ABNORMAL LOW (ref >60)
Glucose, Bld: 111 mg/dL — ABNORMAL HIGH (ref 70–99)
Potassium: 3.8 mmol/L (ref 3.5–5.1)
Sodium: 137 mmol/L (ref 135–145)
Total Bilirubin: 1 mg/dL (ref 0.3–1.2)
Total Protein: 7.9 g/dL (ref 6.5–8.1)

## 2020-03-10 LAB — DIFFERENTIAL
Abs Immature Granulocytes: 0.02 10*3/uL (ref 0.00–0.07)
Basophils Absolute: 0.1 10*3/uL (ref 0.0–0.1)
Basophils Relative: 1 %
Eosinophils Absolute: 0.1 10*3/uL (ref 0.0–0.5)
Eosinophils Relative: 1 %
Immature Granulocytes: 0 %
Lymphocytes Relative: 21 %
Lymphs Abs: 1.2 10*3/uL (ref 0.7–4.0)
Monocytes Absolute: 0.6 10*3/uL (ref 0.1–1.0)
Monocytes Relative: 10 %
Neutro Abs: 3.9 10*3/uL (ref 1.7–7.7)
Neutrophils Relative %: 67 %

## 2020-03-10 LAB — CBC
HCT: 36.2 % (ref 36.0–46.0)
Hemoglobin: 12.7 g/dL (ref 12.0–15.0)
MCH: 30.5 pg (ref 26.0–34.0)
MCHC: 35.1 g/dL (ref 30.0–36.0)
MCV: 87 fL (ref 80.0–100.0)
Platelets: 318 10*3/uL (ref 150–400)
RBC: 4.16 MIL/uL (ref 3.87–5.11)
RDW: 13.6 % (ref 11.5–15.5)
WBC: 5.8 10*3/uL (ref 4.0–10.5)
nRBC: 0 % (ref 0.0–0.2)

## 2020-03-10 LAB — CBG MONITORING, ED: Glucose-Capillary: 109 mg/dL — ABNORMAL HIGH (ref 70–99)

## 2020-03-10 LAB — PROTIME-INR
INR: 1.1 (ref 0.8–1.2)
Prothrombin Time: 13.4 seconds (ref 11.4–15.2)

## 2020-03-10 LAB — RESP PANEL BY RT-PCR (FLU A&B, COVID) ARPGX2
Influenza A by PCR: NEGATIVE
Influenza B by PCR: NEGATIVE
SARS Coronavirus 2 by RT PCR: NEGATIVE

## 2020-03-10 LAB — APTT: aPTT: 34 seconds (ref 24–36)

## 2020-03-10 MED ORDER — ACETAMINOPHEN 160 MG/5ML PO SOLN
650.0000 mg | ORAL | Status: DC | PRN
  Filled 2020-03-10: qty 20.3

## 2020-03-10 MED ORDER — OMEGA-3-ACID ETHYL ESTERS 1 G PO CAPS
1.0000 g | ORAL_CAPSULE | Freq: Every day | ORAL | Status: DC
  Administered 2020-03-11 – 2020-03-12 (×2): 1 g via ORAL
  Filled 2020-03-10 (×2): qty 1

## 2020-03-10 MED ORDER — ASPIRIN 325 MG PO TABS
325.0000 mg | ORAL_TABLET | Freq: Every day | ORAL | Status: DC
  Administered 2020-03-11: 08:00:00 325 mg via ORAL
  Filled 2020-03-10 (×3): qty 1

## 2020-03-10 MED ORDER — LATANOPROST 0.005 % OP SOLN
1.0000 [drp] | Freq: Every day | OPHTHALMIC | Status: DC
  Administered 2020-03-10 – 2020-03-11 (×2): 1 [drp] via OPHTHALMIC
  Filled 2020-03-10 (×2): qty 2.5

## 2020-03-10 MED ORDER — CENTRUM SILVER ULTRA WOMENS PO TABS: 1.0000 | ORAL_TABLET | Freq: Every day | ORAL | Status: DC

## 2020-03-10 MED ORDER — FLUTICASONE PROPIONATE 50 MCG/ACT NA SUSP: 2.0000 | Freq: Every day | NASAL | Status: DC

## 2020-03-10 MED ORDER — FLUTICASONE PROPIONATE 50 MCG/ACT NA SUSP
2.0000 | Freq: Every day | NASAL | Status: DC
  Administered 2020-03-12: 09:00:00 2 via NASAL
  Filled 2020-03-10: qty 16

## 2020-03-10 MED ORDER — LEVOTHYROXINE SODIUM 112 MCG PO TABS
112.0000 ug | ORAL_TABLET | Freq: Every day | ORAL | Status: DC
  Administered 2020-03-11 – 2020-03-12 (×2): 112 ug via ORAL
  Filled 2020-03-10 (×3): qty 1

## 2020-03-10 MED ORDER — ASPIRIN 300 MG RE SUPP: 300.0000 mg | Freq: Every day | RECTAL | Status: DC

## 2020-03-10 MED ORDER — STROKE: EARLY STAGES OF RECOVERY BOOK: Freq: Once | Status: DC

## 2020-03-10 MED ORDER — ACETAMINOPHEN 325 MG PO TABS: 650.0000 mg | ORAL_TABLET | ORAL | Status: DC | PRN

## 2020-03-10 MED ORDER — TIMOLOL MALEATE 0.5 % OP SOLN: 1.0000 [drp] | Freq: Two times a day (BID) | OPHTHALMIC | Status: DC

## 2020-03-10 MED ORDER — TIMOLOL MALEATE 0.5 % OP SOLN
1.0000 [drp] | Freq: Two times a day (BID) | OPHTHALMIC | Status: DC
  Administered 2020-03-10 – 2020-03-12 (×3): 1 [drp] via OPHTHALMIC
  Filled 2020-03-10: qty 5

## 2020-03-10 MED ORDER — ATORVASTATIN CALCIUM 20 MG PO TABS
40.0000 mg | ORAL_TABLET | Freq: Every day | ORAL | Status: DC
  Administered 2020-03-11 – 2020-03-12 (×2): 40 mg via ORAL
  Filled 2020-03-10 (×2): qty 2

## 2020-03-10 MED ORDER — FAMOTIDINE 20 MG PO TABS
40.0000 mg | ORAL_TABLET | Freq: Every day | ORAL | Status: DC
  Administered 2020-03-12: 09:00:00 40 mg via ORAL
  Filled 2020-03-10 (×2): qty 2

## 2020-03-10 MED ORDER — SODIUM CHLORIDE 0.9% FLUSH: 3.0000 mL | Freq: Once | INTRAVENOUS | Status: DC

## 2020-03-10 MED ORDER — SENNOSIDES-DOCUSATE SODIUM 8.6-50 MG PO TABS: 1.0000 | ORAL_TABLET | Freq: Every evening | ORAL | Status: DC | PRN

## 2020-03-10 MED ORDER — CALCIUM 600+D PLUS MINERALS 600-400 MG-UNIT PO TABS: 1.0000 | ORAL_TABLET | Freq: Two times a day (BID) | ORAL | Status: DC

## 2020-03-10 MED ORDER — ACETAMINOPHEN 650 MG RE SUPP: 650.0000 mg | RECTAL | Status: DC | PRN

## 2020-03-10 NOTE — H&P (Signed)
History and Physical    Cassie Brewer:[id number] DOB: 01-18-1929 DOA: 03/10/2020  PCP: Valerie Roys, DO   Patient coming from: Home  I have personally briefly reviewed patient's old medical records in [facility]  Chief Complaint: Slurred speech  HPI: Cassie Brewer is a 86 y.o. female with medical history significant for hypertension, hypothyroidism, dyslipidemia and glaucoma who was brought into the ER by family for evaluation of concerns of slurred speech.  Patient and her niece report that 3 days prior to her presentation she had an episode of slurred speech.  Speech has not improved over the last 3 days and her niece states that she is very slow and deliberate when she speaks.  Patient has no weakness, numbness or incoordination.  She denies having any difficulty swallowing, no slurred speech or headache. Labs show sodium 137, potassium 3.8, chloride 102, bicarb 25, glucose 111, BUN 25, creatinine 0.98, calcium 9.8, alkaline phosphatase 46, albumin 4.0, AST 17, ALT 14, total protein 7.9, white count 5.8, hemoglobin 12.7, hematocrit 36.2, MCV 87, RDW 13.6, platelet count 318, PT 13.4, INR 1.1 Respiratory viral panel still pending at the time of this H&P CT scan of the head without contrast shows moderate symmetric frontal lobe atrophy bilaterally.  Mild diffuse atrophy elsewhere.  Patchy small vessel disease in the periventricular white matter.  No acute infarct.  No mass or hemorrhage. Twelve-lead EKG shows normal sinus rhythm with an incomplete right bundle branch block. Nonspecific ST-T wave changes    ED Course: Patient is an 86 year old Caucasian female who presents to the emergency room with her niece for evaluation of slurred speech with concerns for an acute stroke.  She will be referred to observation status for further evaluation  Review of Systems: As per HPI otherwise 10 point review of systems negative.    Past Medical History:  Diagnosis Date  . Glaucoma    . Hyperlipidemia   . Hypertension   . Hypothyroidism 10/02/2014  . Overactive bladder   . Squamous cell skin cancer, face     Past Surgical History:  Procedure Laterality Date  . ABDOMINAL HYSTERECTOMY    . BREAST SURGERY    . THYROIDECTOMY, PARTIAL Right      reports that she has never smoked. She has never used smokeless tobacco. She reports that she does not drink alcohol and does not use drugs.  Allergies  Allergen Reactions  . Amoxicillin     Other reaction(s): Other (See Comments) "blood pressure very high - almost passed out"  . Eryc [Erythromycin] Other (See Comments)  . Sulfa Antibiotics Nausea And Vomiting    Nausea with bactrim in 2017  Nausea with bactrim in 2017     Family History  Problem Relation Age of Onset  . Hypertension Mother   . Diabetes Father   . Colon cancer Father   . Breast cancer Grandchild      Prior to Admission medications   Medication Sig Start Date End Date Taking? Authorizing Provider  benazepril (LOTENSIN) 40 MG tablet Take 1 tablet (40 mg total) by mouth daily. 11/29/19   Park Liter P, DO  Calcium Carbonate-Vit D-Min (CALCIUM 600+D PLUS MINERALS) 600-400 MG-UNIT TABS  10/02/14   [provider]  clotrimazole-betamethasone (LOTRISONE) cream Apply 1 application topically 2 (two) times daily. Patient not taking: Reported on 11/29/2019 05/29/19   Park Liter P, DO  famotidine (PEPCID) 40 MG tablet TAKE 1 TABLET BY MOUTH EVERYDAY AT BEDTIME 02/06/20   Jonathon Bellows,  MD  fluticasone (FLONASE) 50 MCG/ACT nasal spray SPRAY 2 SPRAYS INTO EACH NOSTRIL EVERY DAY 12/02/17   [provider]  hydrochlorothiazide (HYDRODIURIL) 25 MG tablet Take 1 tablet (25 mg total) by mouth daily. 11/29/19   Johnson, Megan P, DO  latanoprost (XALATAN) 0.005 % ophthalmic solution Place 1 drop into both eyes at bedtime. 03/11/15   [provider]  levothyroxine (SYNTHROID) 112 MCG tablet Take 1 tablet (112 mcg total) by mouth daily before  breakfast. 12/04/19   Johnson, Megan P, DO  Multiple Vitamins-Minerals (CENTRUM SILVER ULTRA WOMENS PO) Take by mouth.    [provider]  Omega-3 Fatty Acids (FISH OIL) 1000 MG CAPS Take by mouth daily.    [provider]  timolol (TIMOPTIC) 0.5 % ophthalmic solution  04/28/16   [provider]  triamcinolone ointment (KENALOG) 0.5 % Apply 1 application topically 2 (two) times daily. 11/29/19   Johnson, Megan P, DO  vitamin C (ASCORBIC ACID) 500 MG tablet Take 500 mg by mouth daily.    [provider]    Physical Exam: Vitals:   03/10/20 1059 03/10/20 1128 03/10/20 1325  BP:  116/62 (!) 161/64  Pulse:  92 71  Resp:  18 18  Temp:  98 F (36.7 C)   TempSrc:  Oral   SpO2:  99% 99%  Weight: 68.2 kg    Height: 5\' 3"  (1.6 m)       Vitals:   03/10/20 1059 03/10/20 1128 03/10/20 1325  BP:  116/62 (!) 161/64  Pulse:  92 71  Resp:  18 18  Temp:  98 F (36.7 C)   TempSrc:  Oral   SpO2:  99% 99%  Weight: 68.2 kg    Height: 5\' 3"  (1.6 m)      Constitutional: NAD, alert and oriented x 3 Eyes: PERRL, lids and conjunctivae normal ENMT: Mucous membranes are moist.  Neck: normal, supple, no masses, no thyromegaly Respiratory: clear to auscultation bilaterally, no wheezing, no crackles. Normal respiratory effort. No accessory muscle use.  Cardiovascular: Regular rate and rhythm, no murmurs / rubs / gallops. No extremity edema. 2+ pedal pulses. No carotid bruits.  Abdomen: no tenderness, no masses palpated. No hepatosplenomegaly. Bowel sounds positive.  Musculoskeletal: no clubbing / cyanosis. No joint deformity upper and lower extremities.  Skin: no rashes, lesions, ulcers.  Neurologic: No gross focal neurologic deficit. Able to move all extremities Psychiatric: Normal mood and affect.   Labs on Admission: I have personally reviewed following labs and imaging studies  CBC: Recent Labs  Lab 03/10/20 1132  WBC 5.8  NEUTROABS 3.9  HGB 12.7  HCT 36.2   MCV 87.0  PLT 854   Basic Metabolic Panel: Recent Labs  Lab 03/10/20 1132  NA 137  K 3.8  CL 102  CO2 25  GLUCOSE 111*  BUN 25*  CREATININE 0.98  CALCIUM 9.8   GFR: Estimated Creatinine Clearance: 36.1 mL/min (by C-G formula based on SCr of 0.98 mg/dL). Liver Function Tests: Recent Labs  Lab 03/10/20 1132  AST 17  ALT 14  ALKPHOS 46  BILITOT 1.0  PROT 7.9  ALBUMIN 4.0   No results for input(s): LIPASE, AMYLASE in the last 168 hours. No results for input(s): AMMONIA in the last 168 hours. Coagulation Profile: Recent Labs  Lab 03/10/20 1132  INR 1.1   Cardiac Enzymes: No results for input(s): CKTOTAL, CKMB, CKMBINDEX, TROPONINI in the last 168 hours. BNP (last 3 results) No results for input(s): PROBNP in the  last 8760 hours. HbA1C: No results for input(s): HGBA1C in the last 72 hours. CBG: Recent Labs  Lab 03/10/20 1131  GLUCAP 109*   Lipid Profile: No results for input(s): CHOL, HDL, LDLCALC, TRIG, CHOLHDL, LDLDIRECT in the last 72 hours. Thyroid Function Tests: No results for input(s): TSH, T4TOTAL, FREET4, T3FREE, THYROIDAB in the last 72 hours. Anemia Panel: No results for input(s): VITAMINB12, FOLATE, FERRITIN, TIBC, IRON, RETICCTPCT in the last 72 hours. Urine analysis:    Component Value Date/Time   COLORURINE Yellow 02/08/2013 1642   APPEARANCEUR Clear 11/29/2019 1008   LABSPEC 1.017 02/08/2013 1642   PHURINE 7.0 02/08/2013 1642   GLUCOSEU Negative 11/29/2019 1008   GLUCOSEU Negative 02/08/2013 1642   HGBUR Negative 02/08/2013 1642   BILIRUBINUR Negative 11/29/2019 1008   BILIRUBINUR Negative 02/08/2013 1642   KETONESUR Trace 02/08/2013 1642   PROTEINUR Negative 11/29/2019 1008   PROTEINUR 30 mg/dL 02/08/2013 1642   NITRITE Negative 11/29/2019 1008   NITRITE Negative 02/08/2013 1642   LEUKOCYTESUR Negative 11/29/2019 1008   LEUKOCYTESUR 2+ 02/08/2013 1642    Radiological Exams on Admission: CT HEAD WO CONTRAST  Result Date:  03/10/2020 CLINICAL DATA:  Slurred speech and dysarthria EXAM: CT HEAD WITHOUT CONTRAST TECHNIQUE: Contiguous axial images were obtained from the base of the skull through the vertex without intravenous contrast. COMPARISON:  None FINDINGS: Brain: There is mild diffuse atrophy bilaterally with moderate symmetric frontal lobe atrophy bilaterally. There is no intracranial mass, hemorrhage, extra-axial fluid collection, or midline shift. There is mild small vessel disease in the centra semiovale bilaterally. No acute appearing infarct is evident. Vascular: No hyperdense vessel. There is calcification in each carotid siphon region and in the distal left vertebral artery. Skull: Bony calvarium appears intact. Sinuses/Orbits: Visualized paranasal sinuses are clear. Visualized orbits appear symmetric bilaterally. Other: Visualized mastoid air cells are clear. IMPRESSION: Moderate symmetric frontal lobe atrophy bilaterally. Mild diffuse atrophy elsewhere. Patchy small vessel disease in the periventricular white matter. No acute infarct. No mass or hemorrhage. There are foci of arterial vascular calcification at several sites. Electronically Signed   By: Lowella Grip III M.D.   On: 03/10/2020 11:20    EKG: Independently reviewed.  EKG shows normal sinus rhythm with an incomplete right bundle branch block Nonspecific ST-T wave changes Assessment/Plan Principal Problem:   TIA (transient ischemic attack) Active Problems:   Hypothyroidism   CKD (chronic kidney disease), stage III (HCC)     TIA R/O CVA Patient presents for evaluation of slurred speech Risk factors for CVA include hypertension and dyslipidemia Will refer patient to observation status for stroke work-up Obtain MRI of the brain without contrast Obtain carotid Doppler Obtain 2D echocardiogram to assess LVEF and rule out cardiac thrombus Allow for permissive hypertension until an acute stroke is ruled out Place patient on aspirin and high  intensity statins Will request physical therapy/speech therapy/occupational therapy consult    Hypothyroidism Continue Synthroid   Stage III chronic kidney disease Stable     DVT prophylaxis: SCD Code Status: Full code Family Communication: Greater than 50% of time was spent discussing patient's condition and plan of care with her and her niece at the bedside.  All questions and concerns have been addressed.  They verbalized understanding and agree with the plan.  CODE STATUS was discussed that she is a full code.  She lists her daughter is her healthcare power of attorney. Disposition Plan: Back to previous home environment Consults called: None    Marc Sivertsen MD [facility]  03/10/2020, 2:31 PM

## 2020-03-10 NOTE — Progress Notes (Signed)
*  PRELIMINARY RESULTS* Echocardiogram 2D Echocardiogram has been performed.  Cassie Brewer 03/10/2020, 8:14 PM

## 2020-03-10 NOTE — ED Provider Notes (Addendum)
[facility] Emergency Department Provider Note   ____________________________________________   Event Date/Time   First MD Initiated Contact with Patient 03/10/20 1310     (approximate)  I have reviewed the triage vital signs and the nursing notes.   HISTORY  Chief Complaint Weakness and Aphasia    HPI Cassie Brewer is a 86 y.o. female who comes in with her niece.  Patient and her niece report that on Friday patient had an episode where she her fluency of speech is decreased.  It is remained decreased and she is very slow and deliberate in her speaking.  This is new and not the way she usually speaks.  She has not had any weakness or numbness or incoordination.  She has a history of hypertension hyperlipidemia.         Past Medical History:  Diagnosis Date  . Glaucoma   . Hyperlipidemia   . Hypertension   . Hypothyroidism 10/02/2014  . Overactive bladder   . Squamous cell skin cancer, face     Patient Active Problem List   Diagnosis Date Noted  . 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 Arthropathy 04/24/2015  . Benign hypertensive renal disease 10/02/2014  . Hypothyroidism 10/02/2014  . Hyperlipidemia 10/02/2014  . CKD (chronic kidney disease), stage III ([place]) 10/02/2014    Past Surgical History:  Procedure Laterality Date  . ABDOMINAL HYSTERECTOMY    . BREAST SURGERY    . THYROIDECTOMY, PARTIAL Right     Prior to Admission medications   Medication Sig Start Date End Date Taking? Authorizing Provider  benazepril (LOTENSIN) 40 MG tablet Take 1 tablet (40 mg total) by mouth daily. 11/29/19   Park Liter P, DO  Calcium Carbonate-Vit D-Min (CALCIUM 600+D PLUS MINERALS) 600-400 MG-UNIT TABS  10/02/14   [provider]  clotrimazole-betamethasone (LOTRISONE) cream Apply 1 application topically 2 (two) times daily. Patient not taking: Reported on 11/29/2019 05/29/19   Park Liter P, DO  famotidine (PEPCID) 40 MG tablet TAKE 1 TABLET BY MOUTH  EVERYDAY AT BEDTIME 02/06/20   Jonathon Bellows, MD  fluticasone [facility]) 50 MCG/ACT nasal spray SPRAY 2 SPRAYS INTO EACH NOSTRIL EVERY DAY 12/02/17   [provider]  hydrochlorothiazide (HYDRODIURIL) 25 MG tablet Take 1 tablet (25 mg total) by mouth daily. 11/29/19   Johnson, Megan P, DO  latanoprost (XALATAN) 0.005 % ophthalmic solution Place 1 drop into both eyes at bedtime. 03/11/15   [provider]  levothyroxine (SYNTHROID) 112 MCG tablet Take 1 tablet (112 mcg total) by mouth daily before breakfast. 12/04/19   Johnson, Megan P, DO  Multiple Vitamins-Minerals (CENTRUM SILVER ULTRA WOMENS PO) Take by mouth.    [provider]  Omega-3 Fatty Acids (FISH OIL) 1000 MG CAPS Take by mouth daily.    [provider]  timolol (TIMOPTIC) 0.5 % ophthalmic solution  04/28/16   [provider]  triamcinolone ointment (KENALOG) 0.5 % Apply 1 application topically 2 (two) times daily. 11/29/19   Johnson, Megan P, DO  vitamin C (ASCORBIC ACID) 500 MG tablet Take 500 mg by mouth daily.    [provider]    Allergies Amoxicillin, Eryc [erythromycin], and Sulfa antibiotics  Family History  Problem Relation Age of Onset  . Hypertension Mother   . Diabetes Father   . Colon cancer Father   . Breast cancer Grandchild     Social History Social History   Tobacco Use  . Smoking status: Never Smoker  . Smokeless tobacco: Never  Used  Vaping Use  . Vaping Use: Never used  Substance Use Topics  . Alcohol use: No  . Drug use: No    Review of Systems  Constitutional: No fever/chills Eyes: No visual changes. ENT: No sore throat. Cardiovascular: Denies chest pain. Respiratory: Denies shortness of breath. Gastrointestinal: No abdominal pain.  No nausea, no vomiting.  No diarrhea.  No constipation. Genitourinary: Negative for dysuria. Musculoskeletal: Negative for back pain. Skin: Negative for rash. Neurological: Negative for headaches, focal weakness   ____________________________________________   PHYSICAL EXAM:  VITAL SIGNS: ED Triage Vitals  Enc Vitals Group     BP 03/10/20 1128 116/62     Pulse Rate 03/10/20 1128 92     Resp 03/10/20 1128 18     Temp 03/10/20 1128 98 F (36.7 C)     Temp Source 03/10/20 1128 Oral     SpO2 03/10/20 1128 99 %     Weight 03/10/20 1059 150 lb 5.7 oz (68.2 kg)     Height 03/10/20 1059 5\' 3"  (1.6 m)     Head Circumference --      Peak Flow --      Pain Score 03/10/20 1059 0     Pain Loc --      Pain Edu? --      Excl. in [place]? --    Constitutional: Alert and oriented. Well appearing and in no acute distress. Eyes: Conjunctivae are normal. PERRL. EOMI. Head: Atraumatic. Nose: No congestion/rhinnorhea. Mouth/Throat: Mucous membranes are moist.  Oropharynx non-erythematous. Neck: No stridor.  Cardiovascular: Normal rate, regular rhythm. Grossly normal heart sounds.  Good peripheral circulation. Respiratory: Normal respiratory effort.  No retractions. Lungs CTAB. Gastrointestinal: Soft and nontender. No distention. No abdominal bruits. No CVA tenderness. Musculoskeletal: No lower extremity tenderness nor edema.  No joint effusions. Neurologic:   speech and language is clear but slow and very deliberate.. No gross focal neurologic deficits are appreciated.  Cranial nerves II through XII are intact including visual fields.  Cerebellar finger-nose and rapid alternating movements in hands and toe to my hand normal motor strength is 5/5 throughout patient does not report any numbness Skin:  Skin is warm, dry and intact. No rash noted.   ____________________________________________   LABS (all labs ordered are listed, but only abnormal results are displayed)  Labs Reviewed  COMPREHENSIVE METABOLIC PANEL - Abnormal; Notable for the following components:      Result Value   Glucose, Bld 111 (*)    BUN 25 (*)    GFR, Estimated 55 (*)    All other components within normal limits  CBG MONITORING,  ED - Abnormal; Notable for the following components:   Glucose-Capillary 109 (*)    All other components within normal limits  RESP PANEL BY RT-PCR (FLU A&B, COVID) ARPGX2  PROTIME-INR  APTT  CBC  DIFFERENTIAL   ____________________________________________  EKG  EKG read interpreted by me shows normal sinus rhythm rate of 68 slight left axis nonspecific ST-T wave changes ____________________________________________  RADIOLOGY Gertha Calkin, personally viewed and evaluated these images (plain radiographs) as part of my medical decision making, as well as reviewing the written report by the radiologist.  ED MD interpretation: CT of the head read by radiologist and reviewed by me does not show any acute changes  Official radiology report(s): CT HEAD WO CONTRAST  Result Date: 03/10/2020 CLINICAL DATA:  Slurred speech and dysarthria EXAM: CT HEAD WITHOUT CONTRAST TECHNIQUE: Contiguous axial images were obtained from the base  of the skull through the vertex without intravenous contrast. COMPARISON:  None FINDINGS: Brain: There is mild diffuse atrophy bilaterally with moderate symmetric frontal lobe atrophy bilaterally. There is no intracranial mass, hemorrhage, extra-axial fluid collection, or midline shift. There is mild small vessel disease in the centra semiovale bilaterally. No acute appearing infarct is evident. Vascular: No hyperdense vessel. There is calcification in each carotid siphon region and in the distal left vertebral artery. Skull: Bony calvarium appears intact. Sinuses/Orbits: Visualized paranasal sinuses are clear. Visualized orbits appear symmetric bilaterally. Other: Visualized mastoid air cells are clear. IMPRESSION: Moderate symmetric frontal lobe atrophy bilaterally. Mild diffuse atrophy elsewhere. Patchy small vessel disease in the periventricular white matter. No acute infarct. No mass or hemorrhage. There are foci of arterial vascular calcification at several sites.  Electronically Signed   By: Lowella Grip III M.D.   On: 03/10/2020 11:20    ____________________________________________   PROCEDURES  Procedure(s) performed (including Critical Care):  Procedures   ____________________________________________   INITIAL IMPRESSION / ASSESSMENT AND PLAN / ED COURSE  Patient in fairly good health and good physical condition up until now with new onset of what appears to be a small stroke on Friday.  This is over 48 hours ago but I believe admission for this patient would result in much more rapid evaluation and treatment especially since she is still completely functional even after this little stroke.  Believe admission would provide her with a very rapid evaluation and treatment of any thing that might turn up.              ____________________________________________   FINAL CLINICAL IMPRESSION(S) / ED DIAGNOSES  Final diagnoses:  Cerebrovascular accident (CVA), unspecified mechanism [facility])     ED Discharge Orders    None      *Please note:  NAILEA WHITEHORN was evaluated in Emergency Department on 03/10/2020 for the symptoms described in the history of present illness. She was evaluated in the context of the global COVID-19 pandemic, which necessitated consideration that the patient might be at risk for infection with the SARS-CoV-2 virus that causes COVID-19. Institutional protocols and algorithms that pertain to the evaluation of patients at risk for COVID-19 are in a state of rapid change based on information released by regulatory bodies including the CDC and federal and state organizations. These policies and algorithms were followed during the patient's care in the ED.  Some ED evaluations and interventions may be delayed as a result of limited staffing during and the pandemic.*   Note:  This document was prepared using Dragon voice recognition software and may include unintentional dictation errors.    Nena Polio,  MD 03/10/20 1322    Nena Polio, MD 03/10/20 [phone]

## 2020-03-10 NOTE — ED Notes (Signed)
Pt ambulated to the restroom at this time without assistance

## 2020-03-10 NOTE — ED Notes (Signed)
Pt states she has already taken her morning medications and does not take anymore until bedtime which is BP medications. Casey Burkitt informed.

## 2020-03-10 NOTE — ED Notes (Signed)
cbg 109

## 2020-03-10 NOTE — ED Notes (Signed)
Pt given blanket.

## 2020-03-10 NOTE — ED Notes (Signed)
Attempting to give notice to floor that pt will be transferred

## 2020-03-10 NOTE — ED Triage Notes (Signed)
First Nurse NOte:  Arrives with c/o slurred speech and difficulty stringing words together since Friday night at 2100.  Patient states she can sing without difficulty.  MAE equally and strong.  Speech is clear but broken.

## 2020-03-10 NOTE — Telephone Encounter (Signed)
Cephus Richer NP, with [facility], calling to report she is at pt.'s home to see her and Friday night pt.'s speech became garbled. Is up and has dressed herself, ambulating without difficulty. Tongue deviates to the left and speech remains garbled. Pt. Is refusing ED. Nurse practitioner states she will have pt.'s niece come over and try to convince pt. To go to ED. BP 158/84. Please advise.

## 2020-03-10 NOTE — ED Triage Notes (Signed)
Pt via pov from home with slurred speech since Friday evening. Pt has had no other symptoms, even drove to church yesterday. Denies any weakness or favoring to either side. Pt alert & oriented, can speak but is speaking slowly, broken, and deliberately. Her niece states that this is not normal for her.

## 2020-03-10 NOTE — Progress Notes (Signed)
PT Cancellation Note  Patient Details Name: Cassie Brewer MRN: [id number] DOB: [birth year]   Cancelled Treatment:    Reason Eval/Treat Not Completed: Patient at procedure or test/unavailable. Patient having carotid doppler done at bedside in ED. Will re-attempt when patient available.    Katlin Ciszewski 03/10/2020, 3:28 PM

## 2020-03-10 NOTE — Telephone Encounter (Signed)
FYI Pt on the way to ER with niece.

## 2020-03-10 NOTE — Plan of Care (Signed)
  Problem: Education: Goal: Knowledge of General Education information will improve Description Including pain rating scale, medication(s)/side effects and non-pharmacologic comfort measures Outcome: Progressing   

## 2020-03-11 ENCOUNTER — Encounter: Payer: Self-pay | Admitting: Internal Medicine

## 2020-03-11 ENCOUNTER — Observation Stay: Payer: PPO

## 2020-03-11 DIAGNOSIS — Z90710 Acquired absence of both cervix and uterus: Secondary | ICD-10-CM | POA: Diagnosis not present

## 2020-03-11 DIAGNOSIS — E039 Hypothyroidism, unspecified: Secondary | ICD-10-CM | POA: Diagnosis not present

## 2020-03-11 DIAGNOSIS — Z20822 Contact with and (suspected) exposure to covid-19: Secondary | ICD-10-CM | POA: Diagnosis present

## 2020-03-11 DIAGNOSIS — Z79899 Other long term (current) drug therapy: Secondary | ICD-10-CM | POA: Diagnosis not present

## 2020-03-11 DIAGNOSIS — I129 Hypertensive chronic kidney disease with stage 1 through stage 4 chronic kidney disease, or unspecified chronic kidney disease: Secondary | ICD-10-CM | POA: Diagnosis present

## 2020-03-11 DIAGNOSIS — Z882 Allergy status to sulfonamides status: Secondary | ICD-10-CM | POA: Diagnosis not present

## 2020-03-11 DIAGNOSIS — K219 Gastro-esophageal reflux disease without esophagitis: Secondary | ICD-10-CM | POA: Diagnosis present

## 2020-03-11 DIAGNOSIS — I639 Cerebral infarction, unspecified: Secondary | ICD-10-CM | POA: Diagnosis not present

## 2020-03-11 DIAGNOSIS — I69320 Aphasia following cerebral infarction: Secondary | ICD-10-CM | POA: Diagnosis present

## 2020-03-11 DIAGNOSIS — I4510 Unspecified right bundle-branch block: Secondary | ICD-10-CM | POA: Diagnosis present

## 2020-03-11 DIAGNOSIS — Z833 Family history of diabetes mellitus: Secondary | ICD-10-CM | POA: Diagnosis not present

## 2020-03-11 DIAGNOSIS — Z79890 Hormone replacement therapy (postmenopausal): Secondary | ICD-10-CM | POA: Diagnosis not present

## 2020-03-11 DIAGNOSIS — R4701 Aphasia: Secondary | ICD-10-CM | POA: Diagnosis present

## 2020-03-11 DIAGNOSIS — Z800 Family history of malignant neoplasm of digestive organs: Secondary | ICD-10-CM | POA: Diagnosis not present

## 2020-03-11 DIAGNOSIS — N183 Chronic kidney disease, stage 3 unspecified: Secondary | ICD-10-CM

## 2020-03-11 DIAGNOSIS — E785 Hyperlipidemia, unspecified: Secondary | ICD-10-CM | POA: Diagnosis present

## 2020-03-11 DIAGNOSIS — Z803 Family history of malignant neoplasm of breast: Secondary | ICD-10-CM | POA: Diagnosis not present

## 2020-03-11 DIAGNOSIS — Z8249 Family history of ischemic heart disease and other diseases of the circulatory system: Secondary | ICD-10-CM | POA: Diagnosis not present

## 2020-03-11 DIAGNOSIS — Z881 Allergy status to other antibiotic agents status: Secondary | ICD-10-CM | POA: Diagnosis not present

## 2020-03-11 DIAGNOSIS — I6340 Cerebral infarction due to embolism of unspecified cerebral artery: Secondary | ICD-10-CM | POA: Diagnosis present

## 2020-03-11 DIAGNOSIS — R471 Dysarthria and anarthria: Secondary | ICD-10-CM | POA: Diagnosis present

## 2020-03-11 DIAGNOSIS — I779 Disorder of arteries and arterioles, unspecified: Secondary | ICD-10-CM | POA: Diagnosis not present

## 2020-03-11 DIAGNOSIS — N1831 Chronic kidney disease, stage 3a: Secondary | ICD-10-CM | POA: Diagnosis present

## 2020-03-11 DIAGNOSIS — Z85828 Personal history of other malignant neoplasm of skin: Secondary | ICD-10-CM | POA: Diagnosis not present

## 2020-03-11 DIAGNOSIS — Z888 Allergy status to other drugs, medicaments and biological substances status: Secondary | ICD-10-CM | POA: Diagnosis not present

## 2020-03-11 DIAGNOSIS — R531 Weakness: Secondary | ICD-10-CM | POA: Diagnosis present

## 2020-03-11 DIAGNOSIS — H409 Unspecified glaucoma: Secondary | ICD-10-CM | POA: Diagnosis present

## 2020-03-11 LAB — LIPID PANEL
Cholesterol: 225 mg/dL — ABNORMAL HIGH (ref 0–200)
HDL: 69 mg/dL (ref >40)
LDL Cholesterol: 143 mg/dL — ABNORMAL HIGH (ref 0–99)
Total CHOL/HDL Ratio: 3.3 RATIO
Triglycerides: 63 mg/dL (ref <150)
VLDL: 13 mg/dL (ref 0–40)

## 2020-03-11 LAB — ECHOCARDIOGRAM COMPLETE
AR max vel: 1.24 cm2
AV Peak grad: 5.9 mmHg
Ao pk vel: 1.21 m/s
Area-P 1/2: 3.99 cm2
Height: 63 in
S' Lateral: 3.07 cm
Weight: 2405.66 oz

## 2020-03-11 LAB — HEMOGLOBIN A1C
Hgb A1c MFr Bld: 5.7 % — ABNORMAL HIGH (ref 4.8–5.6)
Mean Plasma Glucose: 116.89 mg/dL

## 2020-03-11 MED ORDER — IOHEXOL 350 MG/ML SOLN
75.0000 mL | Freq: Once | INTRAVENOUS | Status: AC | PRN
  Administered 2020-03-11: 15:00:00 75 mL via INTRAVENOUS

## 2020-03-11 MED ORDER — CLOPIDOGREL BISULFATE 75 MG PO TABS
75.0000 mg | ORAL_TABLET | Freq: Every day | ORAL | Status: DC
  Administered 2020-03-11 – 2020-03-12 (×2): 75 mg via ORAL
  Filled 2020-03-11 (×2): qty 1

## 2020-03-11 MED ORDER — ASPIRIN EC 81 MG PO TBEC
81.0000 mg | DELAYED_RELEASE_TABLET | Freq: Every day | ORAL | Status: DC
  Administered 2020-03-12: 09:00:00 81 mg via ORAL
  Filled 2020-03-11: qty 1

## 2020-03-11 NOTE — Progress Notes (Signed)
OT Cancellation Note  Patient Details Name: Cassie Brewer MRN: [id number] DOB: 06/02/1928   Cancelled Treatment:    Reason Eval/Treat Not Completed: Other (comment). Consult received, chart reviewed. Upon attempt, pt working with SLP. Will re-attempt at later time.  Jeni Salles, MPH, MS, OTR/L ascom [phone] 03/11/20, 8:56 AM

## 2020-03-11 NOTE — Evaluation (Signed)
Physical Therapy Evaluation Patient Details Name: Cassie Brewer MRN: [id number] DOB: 1929-01-16 Today's Date: 03/11/2020   History of Present Illness  Pt admitted for TIA now diagnosed with CVA with complaints of slurred speech. History includes HTN and glaucoma.  Clinical Impression  Pt is a pleasant 86 year old female who was admitted for CVA. Pt demonstrates all bed mobility/transfers/ambulation at baseline level. All coordination and sensation intact at this time. Ambulation improved with increased distance, pt attributes to being in the bed too long. Does demonstrate speech impairments. Pt does not require any further PT needs at this time. Pt will be dc in house and does not require follow up. RN aware. Will dc current orders.    Follow Up Recommendations No PT follow up    Equipment Recommendations  None recommended by PT    Recommendations for Other Services       Precautions / Restrictions Precautions Precautions: Fall Restrictions Weight Bearing Restrictions: No      Mobility  Bed Mobility Overal bed mobility: Independent             General bed mobility comments: safe technique    Transfers Overall transfer level: Independent Equipment used: None             General transfer comment: safe technique  Ambulation/Gait Ambulation/Gait assistance: Supervision Gait Distance (Feet): 250 Feet Assistive device: None Gait Pattern/deviations: Step-through pattern     General Gait Details: ambulation performed in hallway, initially with supervision and slight unsteady. As further distance progressed, improved balance and gait sequencing noted.  Stairs            Wheelchair Mobility    Modified Rankin (Stroke Patients Only)       Balance Overall balance assessment: Independent                                           Pertinent Vitals/Pain Pain Assessment: No/denies pain    Home Living Family/patient expects to be  discharged to:: Private residence Living Arrangements: Alone Available Help at Discharge: Family;Available PRN/intermittently Type of Home: House         Home Equipment: None      Prior Function Level of Independence: Independent         Comments: very active and indep at baseline. Has been doing Tai Chi.     Hand Dominance        Extremity/Trunk Assessment   Upper Extremity Assessment Upper Extremity Assessment: Overall WFL for tasks assessed    Lower Extremity Assessment Lower Extremity Assessment: Overall WFL for tasks assessed       Communication   Communication: No difficulties  Cognition Arousal/Alertness: Awake/alert Behavior During Therapy: WFL for tasks assessed/performed Overall Cognitive Status: Within Functional Limits for tasks assessed                                 General Comments: has slurred speech      General Comments      Exercises     Assessment/Plan    PT Assessment Patent does not need any further PT services  PT Problem List         PT Treatment Interventions      PT Goals (Current goals can be found in the Care Plan section)  Acute Rehab PT Goals Patient Stated  Goal: to go home PT Goal Formulation: All assessment and education complete, DC therapy Time For Goal Achievement: 03/11/20 Potential to Achieve Goals: Good    Frequency     Barriers to discharge        Co-evaluation               AM-PAC PT "6 Clicks" Mobility  Outcome Measure Help needed turning from your back to your side while in a flat bed without using bedrails?: None Help needed moving from lying on your back to sitting on the side of a flat bed without using bedrails?: None Help needed moving to and from a bed to a chair (including a wheelchair)?: None Help needed standing up from a chair using your arms (e.g., wheelchair or bedside chair)?: None Help needed to walk in hospital room?: None Help needed climbing 3-5 steps with  a railing? : None 6 Click Score: 24    End of Session Equipment Utilized During Treatment: Gait belt Activity Tolerance: Patient tolerated treatment well Patient left: in bed;with family/visitor present Nurse Communication: Mobility status PT Visit Diagnosis: Difficulty in walking, not elsewhere classified (R26.2)    Time: 8466-5993 PT Time Calculation (min) (ACUTE ONLY): 12 min   Charges:   PT Evaluation $PT Eval Low Complexity: 1 Low          Cassie Brewer, PT, DPT [phone]   Cassie Brewer 03/11/2020, 12:28 PM

## 2020-03-11 NOTE — Progress Notes (Signed)
CTA head and neck completed. Reviewed and d/w neurorads Dr Ronnald Ramp.  IMPRESSION: 1. Acute left frontal cortical infarct, better characterized on recent MRI. No substantial mass effect. 2. Suspected occlusion of a left distal M2/proximal M3 MCA branch in the region of known infarct. 3. Approximately 4 mm tubular outpouching arising from the right carotid terminus. It is unclear if this is an occluded enlarged lenticulostriate perforator vessel or an aneurysm. 4. No hemodynamically significant stenosis in the neck. 5. Irregularity of bilateral internal carotid arteries and vertebral arteries at the skull base with somewhat beaded appearance, suggestive of fibromuscular dysplasia. 6. 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  Aneurysms likely incidental. Occlusion of the distal left M2/M3 too far out, time of LKW beyond window for treatment and NIHSS low preclude thrombectomy. Plan as in initial consult. Will follow in the AM.  -- Amie Portland, MD [facility] Pager: [phone] If 7pm to 7am, please call on call as listed on AMION.

## 2020-03-11 NOTE — Progress Notes (Signed)
OT Screen Note  Patient Details Name: Cassie Brewer MRN: [id number] DOB: [birth year]   Cancelled Treatment:    Reason Eval/Treat Not Completed: OT screened, no needs identified, will sign off. Order received, chart reviewed. Pt independent, only expressive language deficits at this time. No skilled OT needs identified. Pt/son in agreement. Will sign off. Please re-consult if additional needs arise.   Jeni Salles, MPH, MS, OTR/L ascom [phone] 03/11/20, 4:18 PM

## 2020-03-11 NOTE — Progress Notes (Signed)
PROGRESS NOTE  Cassie Brewer GBT:[id number] DOB: Feb 21, 1929 DOA: 03/10/2020 PCP: Valerie Roys, DO  Brief History   Cassie Brewer is a 86 y.o. female with medical history significant for hypertension, hypothyroidism, dyslipidemia and glaucoma who was brought into the ER by family for evaluation of concerns of slurred speech.  Patient and her niece report that 3 days prior to her presentation she had an episode of slurred speech.  Speech has not improved over the last 3 days and her niece states that she is very slow and deliberate when she speaks.  Patient has no weakness, numbness or incoordination.  She denies having any difficulty swallowing, no slurred speech or headache. Labs show sodium 137, potassium 3.8, chloride 102, bicarb 25, glucose 111, BUN 25, creatinine 0.98, calcium 9.8, alkaline phosphatase 46, albumin 4.0, AST 17, ALT 14, total protein 7.9, white count 5.8, hemoglobin 12.7, hematocrit 36.2, MCV 87, RDW 13.6, platelet count 318, PT 13.4, INR 1.1 Respiratory viral panel still pending at the time of this H&P CT scan of the head without contrast shows moderate symmetric frontal lobe atrophy bilaterally.  Mild diffuse atrophy elsewhere.  Patchy small vessel disease in the periventricular white matter.  No acute infarct.  No mass or hemorrhage. Twelve-lead EKG shows normal sinus rhythm with an incomplete right bundle branch block. Nonspecific ST-T wave changes    ED Course: Patient is an 86 year old Caucasian female who presents to the emergency room with her niece for evaluation of slurred speech with concerns for an acute stroke.  She will be referred to observation status for further evaluation.  [facility] have been consulted to admit the patient for further evaluation and treatment. Neurology has been consulted. CT head demonstrated moderate symmetric frontal lobe atrophy bilaterally. There is mild diffuse atrophy elsewhere. There is patchy small vessel disease in the  periventricular white matter. No acute infarct, mass or hemorrhage is seen. MRI brain demonstrated a small acute cortical infarct within the posterior left frontal lobe. No mass effect or hemorrhage. There are also multiple old infarcts and chronic small vessel disease. Neurology has ordered CTA head and neck. They have also recommended ASA and Plavix at discharge to be continued for 3 weeks to be followed with ASA 81 mg daily, PT/OT and Speech eval, Telemetry, and cardiac monitor on discharge if no AF is observed while inpatient. Continue Atorvastatin 80 mg daily. No need for permissive hypertension.   Consultants  . Neurology  Procedures  . None  Antibiotics   Anti-infectives (From admission, onward)   None    .  Subjective  The patient is resting comfortably. No new complaints. Spouse is at bedside.  Objective   Vitals:  Vitals:   03/11/20 0725 03/11/20 1111  BP: (!) 157/68 117/61  Pulse: 79 74  Resp: 16 16  Temp:  98.1 F (36.7 C)  SpO2: 98% 93%    Exam:  Constitutional:  . The patient is awake, alert, and oriented x 3. No acute distress. Speech is slow and slurred. Respiratory:  . No increased work of breathing. . No wheezes, rales, or rhonchi . No tactile fremitus Cardiovascular:  . Regular rate and rhythm . No murmurs, ectopy, or gallups. . No lateral PMI. No thrills. Abdomen:  . Abdomen is soft, non-tender, non-distended . No hernias, masses, or organomegaly . Normoactive bowel sounds.  Musculoskeletal:  . No cyanosis, clubbing, or edema Skin:  . No rashes, lesions, ulcers . palpation of skin: no induration or nodules Neurologic:  .  CN 2-12 intact . Sensation all 4 extremities intact Psychiatric:  . Mental status o Mood, affect appropriate o Orientation to person, place, time  . judgment and insight appear intact  I have personally reviewed the following:   Today's Data  . Vitals, CMP, lipid profile, CBC  Imaging  . CT head . MRI  Brain . CTA head and neck: pending.  Cardiology Data  . Echocardiogram: pending  Scheduled Meds: .  stroke: mapping our early stages of recovery book   Does not apply Once  . aspirin EC  81 mg Oral Daily  . atorvastatin  40 mg Oral Daily  . clopidogrel  75 mg Oral Daily  . famotidine  40 mg Oral Daily  . fluticasone  2 spray Each Nare Daily  . latanoprost  1 drop Both Eyes QHS  . levothyroxine  112 mcg Oral QAC breakfast  . omega-3 acid ethyl esters  1 g Oral Daily  . sodium chloride flush  3 mL Intravenous Once  . timolol  1 drop Both Eyes BID   Continuous Infusions:  Principal Problem:   TIA (transient ischemic attack) Active Problems:   Hypothyroidism   CKD (chronic kidney disease), stage III (HCC)   LOS: 0 days    A & P  Acute CVA: MRI Brain demonstrates a small acute cortical infarct within the posterior left frontal lobe. No mass effect or hemorrhage. There are also multiple old infarcts and chronic small vessel disease. Neurology has ordered CTA head and neck. They have also recommended ASA and Plavix at discharge to be continued for 3 weeks to be followed with ASA 81 mg daily, PT/OT and Speech eval, Telemetry, and cardiac monitor on discharge if no AF is observed while inpatient. Continue Atorvastatin 80 mg daily. No need for permissive hypertension. Echocardiogram and CTA head and neck are pending.  Hypothyroidism: Continue Synthroid as previous  Hyperlipidemia: Continue Atorvastatin as previous. LDL 69.  CKD IIIa: Creatinine 0.98 this morning. Monitor creatinine, electrolytes, and volume status. Avoid nephrotoxins and hypotension.  I have seen and examined this patient myself. I have spent 35 minutes in her evaluation and care.  DVT Prophylaxis: SCD's CODE STATUS: Full Code Family Communication: Spouse at bedside Disposition: Status is: Inpatient  Remains inpatient appropriate because:Inpatient level of care appropriate due to severity of illness   Dispo:  The patient is from: Home              Anticipated d/c is to: Home              Anticipated d/c date is: 1 day              Patient currently is not medically stable to d/c.  Kadan Millstein, DO [facility] Direct contact: see [email or web address]  7PM-7AM contact night coverage as above 03/11/2020, 2:57 PM  LOS: 0 days

## 2020-03-11 NOTE — Consult Note (Signed)
Neurology Consultation  Reason for Consult: Stroke Referring Physician: Dr. Benny Lennert  CC: Slurred speech  History is obtained from: Patient, chart, patient's son at bedside  HPI: Cassie Brewer is a 86 y.o. female who has a past medical history of hypertension, hyperlipidemia, hypothyroidism, presented to the emergency room for evaluation of slurred speech.  Her last known well was sometime on Friday, 03/07/2020 when at around 9 PM she answered the phone and her speech was extremely garbled and words were not making sense.  She did not make much of it and watch for a day or 2 but there was no improvement in her symptoms, which made her come to the emergency room.  An MRI of the brain was done which showed a small acute cortical infarct in the posterior left frontal lobe with no hemorrhage or mass-effect. Neurological consultation was obtained for further work-up. She denies any prior history of stroke. She denies any tingling numbness or weakness. She reports a chronic right-sided headache that has gone on for years.  No jaw claudication.  No vision loss. Denies chest pain palpitations but says that her home blood pressure machine at 1 point did exhibit some irregular heart rhythm.  She has never been diagnosed with atrial fibrillation.   LKW: 9 PM on 03/06/2020 tpa given?: no, outside the window Premorbid modified Rankin scale (mRS): 0 Extremely independent at baseline.  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  Walks independently. Drives a [employer].  ROS: Performed and negative except as noted in HPI.  Past Medical History:  Diagnosis Date  . Glaucoma   . Hyperlipidemia   . Hypertension   . Hypothyroidism 10/02/2014  . Overactive bladder   . Squamous cell skin cancer, face     Family History  Problem Relation Age of Onset  . Hypertension Mother   . Diabetes Father   . Colon cancer Father   . Breast cancer Grandchild      Social History:   reports that she has never smoked. She has  never used smokeless tobacco. She reports that she does not drink alcohol and does not use drugs.  Medications  Current Facility-Administered Medications:  .   stroke: mapping our early stages of recovery book, , Does not apply, Once, Agbata, Tochukwu, MD .  acetaminophen (TYLENOL) tablet 650 mg, 650 mg, Oral, Q4H PRN **OR** acetaminophen (TYLENOL) 160 MG/5ML solution 650 mg, 650 mg, Per Tube, Q4H PRN **OR** acetaminophen (TYLENOL) suppository 650 mg, 650 mg, Rectal, Q4H PRN, Agbata, Tochukwu, MD .  aspirin suppository 300 mg, 300 mg, Rectal, Daily **OR** aspirin tablet 325 mg, 325 mg, Oral, Daily, Agbata, Tochukwu, MD, 325 mg at 03/11/20 0824 .  atorvastatin (LIPITOR) tablet 40 mg, 40 mg, Oral, Daily, Agbata, Tochukwu, MD, 40 mg at 03/11/20 0825 .  famotidine (PEPCID) tablet 40 mg, 40 mg, Oral, Daily, Agbata, Tochukwu, MD .  fluticasone (FLONASE) 50 MCG/ACT nasal spray 2 spray, 2 spray, Each Nare, Daily, Agbata, Tochukwu, MD .  latanoprost (XALATAN) 0.005 % ophthalmic solution 1 drop, 1 drop, Both Eyes, QHS, Agbata, Tochukwu, MD, 1 drop at 03/10/20 2214 .  levothyroxine (SYNTHROID) tablet 112 mcg, 112 mcg, Oral, QAC breakfast, Agbata, Tochukwu, MD, 112 mcg at 03/11/20 0825 .  omega-3 acid ethyl esters (LOVAZA) capsule 1 g, 1 g, Oral, Daily, Agbata, Tochukwu, MD, 1 g at 03/11/20 0825 .  senna-docusate (Senokot-S) tablet 1 tablet, 1 tablet, Oral, QHS PRN, Agbata, Tochukwu, MD .  sodium chloride flush (NS) 0.9 % injection 3 mL,  3 mL, Intravenous, Once, Nena Polio, MD .  timolol (TIMOPTIC) 0.5 % ophthalmic solution 1 drop, 1 drop, Both Eyes, BID, Agbata, Tochukwu, MD, 1 drop at 03/11/20 0828   Exam: Current vital signs: BP 117/61 (BP Location: Right Arm)   Pulse 74   Temp 98.1 F (36.7 C) (Oral)   Resp 16   Ht (P) 5\' 3"  (1.6 m)   Wt (P) 66.9 kg   LMP 05/13/1980   SpO2 93%   BMI (P) 26.13 kg/m  Vital signs in last 24 hours: Temp:  [97.6 F (36.4 C)-98.1 F (36.7 C)] 98.1 F (36.7  C) (12/14 1111) Pulse Rate:  [70-82] 74 (12/14 1111) Resp:  [13-19] 16 (12/14 1111) BP: (108-169)/(55-88) 117/61 (12/14 1111) SpO2:  [93 %-99 %] 93 % (12/14 1111) Weight:  [66.9 kg] (P) 66.9 kg (12/13 2200)  GENERAL: Awake, alert in NAD HEENT: - Normocephalic and atraumatic, dry mm, no LN++, no Thyromegally LUNGS - Clear to auscultation bilaterally with no wheezes CV - S1S2 RRR, no m/r/g, equal pulses bilaterally. ABDOMEN - Soft, nontender, nondistended with normoactive BS Ext: warm, well perfused, intact peripheral pulses, no edema  NEURO:  Mental Status: AA&Ox3  Language: speech is moderately dysarthric.  Naming, repetition, fluency, and comprehension intact. Cranial Nerves: PERRL. EOMI, visual fields full, no facial asymmetry, facial sensation intact, hearing intact, tongue/uvula/soft palate midline, normal sternocleidomastoid and trapezius muscle strength. No evidence of tongue atrophy or fibrillations Motor: 5/5 with no vertical drift in any of the 4 extremities Tone: is normal and bulk is normal Sensation- Intact to light touch bilaterally, no extinction Coordination: FTN intact bilaterally Gait- deferred NIH stroke scale-1 for dysarthria    Labs I have reviewed labs in epic and the results pertinent to this consultation are:   CBC    Component Value Date/Time   WBC 5.8 03/10/2020 1132   RBC 4.16 03/10/2020 1132   HGB 12.7 03/10/2020 1132   HGB 12.9 11/29/2019 1017   HCT 36.2 03/10/2020 1132   HCT 38.2 11/29/2019 1017   PLT 318 03/10/2020 1132   PLT 302 11/29/2019 1017   MCV 87.0 03/10/2020 1132   MCV 88 11/29/2019 1017   MCV 87 02/08/2013 1642   MCH 30.5 03/10/2020 1132   MCHC 35.1 03/10/2020 1132   RDW 13.6 03/10/2020 1132   RDW 13.2 11/29/2019 1017   RDW 13.4 02/08/2013 1642   LYMPHSABS 1.2 03/10/2020 1132   LYMPHSABS 1.5 11/29/2019 1017   MONOABS 0.6 03/10/2020 1132   EOSABS 0.1 03/10/2020 1132   EOSABS 0.2 11/29/2019 1017   BASOSABS 0.1 03/10/2020  1132   BASOSABS 0.1 11/29/2019 1017    CMP     Component Value Date/Time   NA 137 03/10/2020 1132   NA 140 11/29/2019 1017   NA 132 (L) 02/08/2013 1642   K 3.8 03/10/2020 1132   K 3.3 (L) 02/08/2013 1642   CL 102 03/10/2020 1132   CL 98 02/08/2013 1642   CO2 25 03/10/2020 1132   CO2 27 02/08/2013 1642   GLUCOSE 111 (H) 03/10/2020 1132   GLUCOSE 129 (H) 02/08/2013 1642   BUN 25 (H) 03/10/2020 1132   BUN 22 11/29/2019 1017   BUN 15 02/08/2013 1642   CREATININE 0.98 03/10/2020 1132   CREATININE 0.84 02/08/2013 1642   CALCIUM 9.8 03/10/2020 1132   CALCIUM 9.4 02/08/2013 1642   PROT 7.9 03/10/2020 1132   PROT 7.5 11/29/2019 1017   PROT 7.5 02/08/2013 1642   ALBUMIN 4.0 03/10/2020 1132  ALBUMIN 4.7 (H) 11/29/2019 1017   ALBUMIN 3.7 02/08/2013 1642   AST 17 03/10/2020 1132   AST 25 05/05/2016 0926   AST 22 02/08/2013 1642   ALT 14 03/10/2020 1132   ALT 15 05/05/2016 0926   ALT 20 02/08/2013 1642   ALKPHOS 46 03/10/2020 1132   ALKPHOS 75 02/08/2013 1642   BILITOT 1.0 03/10/2020 1132   BILITOT 0.5 11/29/2019 1017   BILITOT 0.7 02/08/2013 1642   GFRNONAA 55 (L) 03/10/2020 1132   GFRNONAA >60 02/08/2013 1642   GFRAA 54 (L) 11/29/2019 1017   GFRAA >60 02/08/2013 1642    Lipid Panel     Component Value Date/Time   CHOL 225 (H) 03/11/2020 0612   CHOL 260 (H) 11/29/2019 1017   CHOL 211 (H) 05/05/2016 0926   TRIG 63 03/11/2020 0612   TRIG 77 05/05/2016 0926   HDL 69 03/11/2020 0612   HDL 69 11/29/2019 1017   CHOLHDL 3.3 03/11/2020 0612   VLDL 13 03/11/2020 0612   VLDL 15 05/05/2016 0926   LDLCALC 143 (H) 03/11/2020 0612   LDLCALC 173 (H) 11/29/2019 1017  A1c 5.7 LDL 143  2D echocardiogram LVEF 55 to 60% with no regional wall motion abnormalities.  Left atrium mildly dilated.  Right atrium mildly dilated.  Aortic valve normal.  Aortic borderline aortic root dilatation. No atrial level shunt detected by color-flow Doppler.  Carotid Dopplers:  Unremarkable  Imaging I have reviewed the images obtained  MRI examination of the brain-acute infarction in the left posterior frontal lobe.  Assessment:  86 year old woman with above past medical history presenting for evaluation of sudden onset of slurred speech. On examination she does have mild to moderate amount of dysarthria. No evidence of aphasia.  No LVO signs. MRI of the brain shows a left posterior frontal lobe acute ischemic infarct-by appearance highly suspicious for a embolic stroke. Does not have a history of atrial fibrillation but does mention having had irregular heart rhythm noted on one of the home machines at one point. 2D echocardiogram shows dilated left atrium-will benefit from outpatient long-term monitoring for detection of paroxysmal atrial fibrillation  Impression: Acute ischemic stroke-etiology under investigation-strong suspicion for being an embolic stroke  Recommendations: -Frequent neurochecks -CTA head and neck -Aspirin 81 and Plavix 75 for 3 weeks followed by aspirin 81 only. -Atorvastatin 80-goal LDL less than 70 -PT OT speech therapy -Continue telemetry -If no atrial fibrillation is found, would recommend outpatient cardiac monitoring. -No need for permissive hypertension.  Goal blood pressure on discharge should be 140/90 or below. -Follow-up with cardiology and neurology as outpatient.  Relayed my plan to Dr. Benny Lennert.  -- Amie Portland, MD [facility] Pager: [phone] If 7pm to 7am, please call on call as listed on AMION.

## 2020-03-11 NOTE — Evaluation (Signed)
Speech Language Pathology Evaluation Patient Details Name: Cassie Brewer MRN: [id number] DOB: 07-Nov-1928 Today's Date: 03/11/2020 Time: 3009-2330 SLP Time Calculation (min) (ACUTE ONLY): 40 min  Problem List:  Patient Active Problem List   Diagnosis Date Noted  . Stroke ([place]) 03/11/2020  . TIA (transient ischemic attack) 03/10/2020  . 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 Arthropathy 04/24/2015  . Benign hypertensive renal disease 10/02/2014  . Hypothyroidism 10/02/2014  . Hyperlipidemia 10/02/2014  . CKD (chronic kidney disease), stage III ([place]) 10/02/2014   Past Medical History:  Past Medical History:  Diagnosis Date  . Glaucoma   . Hyperlipidemia   . Hypertension   . Hypothyroidism 10/02/2014  . Overactive bladder   . Squamous cell skin cancer, face    Past Surgical History:  Past Surgical History:  Procedure Laterality Date  . ABDOMINAL HYSTERECTOMY    . BREAST SURGERY    . THYROIDECTOMY, PARTIAL Right    HPI:  Cassie Brewer is a 86 y.o. female with medical history significant for hypertension, hypothyroidism, dyslipidemia and glaucoma who was brought into the ER by family for evaluation of concerns of slurred speech.  Patient and her niece report that 3 days prior to her presentation she had an episode of slurred speech.  Speech has not improved over the last 3 days and her niece states that she is very slow and deliberate when she speaks.  Patient has no weakness, numbness or incoordination.  She denies having any difficulty swallowing, no slurred speech or headache. MRi revealed Small acute cortical infarct within the posterior left frontal lobe, no mass effect or hemorrhage and multiple old infarcts and findings of chronic small vessel disease.   Assessment / Plan / Recommendation Clinical Impression  Pt presents with expressive aphasia and intact receptive language abilities. Pt has higher level word finding deficits that result in speech that is telegraphic,  labored and  lacks intonation. Pt is able to communicate very basic wants and needs but requires skilled ST intervention to target higher level word finding deficits for return to independent conversation of complex information. Also provided education to pt and her son on s/s of stroke and having a life alert system should pt have an emergency and is unable to verbally communicate.    SLP Assessment  SLP Recommendation/Assessment: Patient needs continued Speech Lanaguage Pathology Services SLP Visit Diagnosis: Aphasia (R47.01)    Follow Up Recommendations  Outpatient SLP    Frequency and Duration min 2x/week  1 week      SLP Evaluation Cognition  Overall Cognitive Status: Within Functional Limits for tasks assessed Arousal/Alertness: Awake/alert Orientation Level: Oriented X4 Attention: Selective;Alternating Selective Attention: Appears intact Alternating Attention: Appears intact Memory: Appears intact Awareness: Appears intact Problem Solving: Appears intact Safety/Judgment: Appears intact       Comprehension  Auditory Comprehension Overall Auditory Comprehension: Appears within functional limits for tasks assessed Yes/No Questions: Within Functional Limits Commands: Within Functional Limits Visual Recognition/Discrimination Discrimination: Within Function Limits Reading Comprehension Reading Status: Within funtional limits    Expression Expression Primary Mode of Expression: Verbal Verbal Expression Overall Verbal Expression: Impaired Initiation: No impairment Automatic Speech: Name;Social Response Level of Generative/Spontaneous Verbalization: Sentence Repetition: No impairment Naming: Impairment Responsive: 76-100% accurate Confrontation: Within functional limits Convergent: 0-24% accurate Divergent: 0-24% accurate Pragmatics: No impairment Non-Verbal Means of Communication: Not applicable Written Expression Dominant Hand: Right Written Expression: Within Functional  Limits   Oral / Motor  Oral Motor/Sensory Function Overall Oral Motor/Sensory Function: Within functional limits Motor Speech  Overall Motor Speech: Appears within functional limits for tasks assessed Respiration: Within functional limits Phonation: Normal Resonance: Within functional limits Articulation: Within functional limitis Intelligibility: Intelligible Motor Planning: Witnin functional limits Motor Speech Errors: Not applicable   GO              Jessyca Sloan B. Rutherford Nail M.S., CCC-SLP, [place] Office [phone]  Stormy Fabian 03/11/2020, 7:28 PM

## 2020-03-11 NOTE — Care Management Obs Status (Signed)
MEDICARE OBSERVATION STATUS NOTIFICATION   Patient Details  Name: Cassie Brewer MRN: [id number] Date of Birth: 09-17-1928   Medicare Observation Status Notification Given:  Yes    Shelbie Hutching, RN 03/11/2020, 11:39 AM

## 2020-03-11 NOTE — TOC Initial Note (Signed)
Transition of Care [facility]) - Initial/Assessment Note    Patient Details  Name: Cassie Brewer MRN: [id number] Date of Birth: 1928-06-04  Transition of Care [facility]) CM/SW Contact:    Shelbie Hutching, RN Phone Number: 03/11/2020, 1:07 PM  Clinical Narrative:                 Patient placed under observation for stroke.  RNCM was able to meet with patient and her son [name] at the bedside.  Patient is from home where she lives alone and is independent.  Patient drives.  Patient is current with PCP Dr. Park Liter in [place] and uses [facility].  Patient does not need any DME she walks with a cane at home.  No PT follow up recommended but speech recommends OP Speech therapy and patient agrees and chooses [facility] OP therapy.  Referral for OP Speech faxed to [phone].  No other discharge needs identified at this time but TOC will continue to follow through discharge.   Expected Discharge Plan: OP Rehab Barriers to Discharge: Continued Medical Work up   Patient Goals and CMS Choice Patient states their goals for this hospitalization and ongoing recovery are:: To speak clearly and live independently CMS Medicare.gov Compare Post Acute Care list provided to:: Patient Choice offered to / list presented to : Patient  Expected Discharge Plan and Services Expected Discharge Plan: OP Rehab   Discharge Planning Services: CM Consult Post Acute Care Choice:  (OP Rehab) Living arrangements for the past 2 months: Single Family Home                 DME Arranged: N/A         HH Arranged: NA          Prior Living Arrangements/Services Living arrangements for the past 2 months: Single Family Home Lives with:: Self Patient language and need for interpreter reviewed:: Yes Do you feel safe going back to the place where you live?: Yes      Need for Family Participation in Patient Care: Yes (Comment) (stroke) Care giver support system in place?: Yes (comment) (son daughter and  niece) Current home services: DME (cane) Criminal Activity/Legal Involvement Pertinent to Current Situation/Hospitalization: No - Comment as needed  Activities of Daily Living Home Assistive Devices/Equipment: Eyeglasses ADL Screening (condition at time of admission) Patient's cognitive ability adequate to safely complete daily activities?: Yes Is the patient deaf or have difficulty hearing?: No Does the patient have difficulty seeing, even when wearing glasses/contacts?: No Does the patient have difficulty concentrating, remembering, or making decisions?: No Patient able to express need for assistance with ADLs?: Yes Does the patient have difficulty dressing or bathing?: No Independently performs ADLs?: Yes (appropriate for developmental age) Does the patient have difficulty walking or climbing stairs?: Yes Weakness of Legs: Both Weakness of Arms/Hands: None  Permission Sought/Granted Permission sought to share information with : Family Nature conservation officer Permission granted to share information with : Yes, Verbal Permission Granted  Share Information with NAME: [name]  Permission granted to share info w AGENCY: [facility] OP Therapy  Permission granted to share info w Relationship: son     Emotional Assessment Appearance:: Appears stated age Attitude/Demeanor/Rapport: Engaged Affect (typically observed): Accepting Orientation: : Oriented to Self,Oriented to Place,Oriented to  Time,Oriented to Situation Alcohol / Substance Use: Not Applicable Psych Involvement: No (comment)  Admission diagnosis:  TIA (transient ischemic attack) [G45.9] Carotid artery disease ([place]) [I77.9] Cerebrovascular accident (CVA), unspecified mechanism ([place]) [I63.9] Patient Active Problem List  Diagnosis Date Noted  . TIA (transient ischemic attack) 03/10/2020  . 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 Arthropathy 04/24/2015  . Benign hypertensive renal disease 10/02/2014  . Hypothyroidism 10/02/2014  .  Hyperlipidemia 10/02/2014  . CKD (chronic kidney disease), stage III ([place]) 10/02/2014   PCP:  Valerie Roys, DO Pharmacy:   [id number] [place], [place] [address] [place] [address] Phone: [phone] Fax: [phone]     Social Determinants of Health (SDOH) Interventions    Readmission Risk Interventions No flowsheet data found.

## 2020-03-12 DIAGNOSIS — I639 Cerebral infarction, unspecified: Secondary | ICD-10-CM | POA: Diagnosis not present

## 2020-03-12 DIAGNOSIS — R002 Palpitations: Secondary | ICD-10-CM | POA: Diagnosis not present

## 2020-03-12 LAB — BASIC METABOLIC PANEL
Anion gap: 10 (ref 5–15)
BUN: 26 mg/dL — ABNORMAL HIGH (ref 8–23)
CO2: 25 mmol/L (ref 22–32)
Calcium: 9 mg/dL (ref 8.9–10.3)
Chloride: 102 mmol/L (ref 98–111)
Creatinine, Ser: 1.09 mg/dL — ABNORMAL HIGH (ref 0.44–1.00)
GFR, Estimated: 49 mL/min — ABNORMAL LOW (ref >60)
Glucose, Bld: 112 mg/dL — ABNORMAL HIGH (ref 70–99)
Potassium: 3.8 mmol/L (ref 3.5–5.1)
Sodium: 137 mmol/L (ref 135–145)

## 2020-03-12 MED ORDER — CLOPIDOGREL BISULFATE 75 MG PO TABS: 75.0000 mg | ORAL_TABLET | Freq: Every day | ORAL | 0 refills | Status: AC

## 2020-03-12 MED ORDER — ATORVASTATIN CALCIUM 20 MG PO TABS: 80.0000 mg | ORAL_TABLET | Freq: Every day | ORAL | Status: DC

## 2020-03-12 MED ORDER — ATORVASTATIN CALCIUM 80 MG PO TABS: 80.0000 mg | ORAL_TABLET | Freq: Every day | ORAL | 1 refills | Status: DC

## 2020-03-12 MED ORDER — ASPIRIN 81 MG PO TBEC: 81.0000 mg | DELAYED_RELEASE_TABLET | Freq: Every day | ORAL | 11 refills | Status: DC

## 2020-03-12 NOTE — Plan of Care (Signed)
  Problem: Education: Goal: Knowledge of General Education information will improve Description: Including pain rating scale, medication(s)/side effects and non-pharmacologic comfort measures Outcome: Adequate for Discharge   

## 2020-03-12 NOTE — Progress Notes (Signed)
Neurology Progress Note   S:// Seen and examined.  No acute changes.   O:// Current vital signs: BP (!) 110/59 (BP Location: Left Arm)   Pulse 82   Temp 98.4 F (36.9 C)   Resp 17   Ht (P) 5\' 3"  (1.6 m)   Wt (P) 66.9 kg   LMP 05/13/1980   SpO2 95%   BMI (P) 26.13 kg/m  Vital signs in last 24 hours: Temp:  [98.1 F (36.7 C)-98.4 F (36.9 C)] 98.4 F (36.9 C) (12/15 0752) Pulse Rate:  [73-82] 82 (12/15 0752) Resp:  [16-17] 17 (12/15 0752) BP: (104-124)/(55-74) 110/59 (12/15 0752) SpO2:  [93 %-97 %] 95 % (12/15 0752) GENERAL: Awake, alert in NAD HEENT: - Normocephalic and atraumatic, dry mm, no LN++, no Thyromegally LUNGS - Clear to auscultation bilaterally with no wheezes CV - S1S2 RRR, no m/r/g, equal pulses bilaterally. ABDOMEN - Soft, nontender, nondistended with normoactive BS Ext: warm, well perfused, intact peripheral pulses, no edema  NEURO:  Mental Status: AA&Ox3  Language: speech is moderately dysarthric.  Naming, repetition, fluency, and comprehension intact. Cranial Nerves: PERRL. EOMI, visual fields full, no facial asymmetry, facial sensation intact, hearing intact, tongue/uvula/soft palate midline, normal sternocleidomastoid and trapezius muscle strength. No evidence of tongue atrophy or fibrillations Motor: 5/5 with no vertical drift in any of the 4 extremities Tone: is normal and bulk is normal Sensation- Intact to light touch bilaterally, no extinction Coordination: FTN intact bilaterally Gait- deferred NIH stroke scale-1 for dysarthria Unchanged exam from yesterday.  Medications  Current Facility-Administered Medications:  .   stroke: mapping our early stages of recovery book, , Does not apply, Once, Agbata, Tochukwu, MD .  acetaminophen (TYLENOL) tablet 650 mg, 650 mg, Oral, Q4H PRN **OR** acetaminophen (TYLENOL) 160 MG/5ML solution 650 mg, 650 mg, Per Tube, Q4H PRN **OR** acetaminophen (TYLENOL) suppository 650 mg, 650 mg, Rectal, Q4H PRN, Agbata,  Tochukwu, MD .  aspirin EC tablet 81 mg, 81 mg, Oral, Daily, Amie Portland, MD, 81 mg at 03/12/20 0909 .  [START ON 03/13/2020] atorvastatin (LIPITOR) tablet 80 mg, 80 mg, Oral, Daily, Amie Portland, MD .  clopidogrel (PLAVIX) tablet 75 mg, 75 mg, Oral, Daily, Amie Portland, MD, 75 mg at 03/12/20 0909 .  famotidine (PEPCID) tablet 40 mg, 40 mg, Oral, Daily, Agbata, Tochukwu, MD, 40 mg at 03/12/20 0909 .  fluticasone (FLONASE) 50 MCG/ACT nasal spray 2 spray, 2 spray, Each Nare, Daily, Agbata, Tochukwu, MD, 2 spray at 03/12/20 0909 .  latanoprost (XALATAN) 0.005 % ophthalmic solution 1 drop, 1 drop, Both Eyes, QHS, Agbata, Tochukwu, MD, 1 drop at 03/11/20 2100 .  levothyroxine (SYNTHROID) tablet 112 mcg, 112 mcg, Oral, QAC breakfast, Agbata, Tochukwu, MD, 112 mcg at 03/12/20 0619 .  omega-3 acid ethyl esters (LOVAZA) capsule 1 g, 1 g, Oral, Daily, Agbata, Tochukwu, MD, 1 g at 03/12/20 0909 .  senna-docusate (Senokot-S) tablet 1 tablet, 1 tablet, Oral, QHS PRN, Agbata, Tochukwu, MD .  sodium chloride flush (NS) 0.9 % injection 3 mL, 3 mL, Intravenous, Once, Nena Polio, MD .  timolol (TIMOPTIC) 0.5 % ophthalmic solution 1 drop, 1 drop, Both Eyes, BID, Agbata, Tochukwu, MD, 1 drop at 03/12/20 0910 Labs CBC    Component Value Date/Time   WBC 5.8 03/10/2020 1132   RBC 4.16 03/10/2020 1132   HGB 12.7 03/10/2020 1132   HGB 12.9 11/29/2019 1017   HCT 36.2 03/10/2020 1132   HCT 38.2 11/29/2019 1017   PLT 318 03/10/2020 1132   PLT  302 11/29/2019 1017   MCV 87.0 03/10/2020 1132   MCV 88 11/29/2019 1017   MCV 87 02/08/2013 1642   MCH 30.5 03/10/2020 1132   MCHC 35.1 03/10/2020 1132   RDW 13.6 03/10/2020 1132   RDW 13.2 11/29/2019 1017   RDW 13.4 02/08/2013 1642   LYMPHSABS 1.2 03/10/2020 1132   LYMPHSABS 1.5 11/29/2019 1017   MONOABS 0.6 03/10/2020 1132   EOSABS 0.1 03/10/2020 1132   EOSABS 0.2 11/29/2019 1017   BASOSABS 0.1 03/10/2020 1132   BASOSABS 0.1 11/29/2019 1017    CMP      Component Value Date/Time   NA 137 03/12/2020 0539   NA 140 11/29/2019 1017   NA 132 (L) 02/08/2013 1642   K 3.8 03/12/2020 0539   K 3.3 (L) 02/08/2013 1642   CL 102 03/12/2020 0539   CL 98 02/08/2013 1642   CO2 25 03/12/2020 0539   CO2 27 02/08/2013 1642   GLUCOSE 112 (H) 03/12/2020 0539   GLUCOSE 129 (H) 02/08/2013 1642   BUN 26 (H) 03/12/2020 0539   BUN 22 11/29/2019 1017   BUN 15 02/08/2013 1642   CREATININE 1.09 (H) 03/12/2020 0539   CREATININE 0.84 02/08/2013 1642   CALCIUM 9.0 03/12/2020 0539   CALCIUM 9.4 02/08/2013 1642   PROT 7.9 03/10/2020 1132   PROT 7.5 11/29/2019 1017   PROT 7.5 02/08/2013 1642   ALBUMIN 4.0 03/10/2020 1132   ALBUMIN 4.7 (H) 11/29/2019 1017   ALBUMIN 3.7 02/08/2013 1642   AST 17 03/10/2020 1132   AST 25 05/05/2016 0926   AST 22 02/08/2013 1642   ALT 14 03/10/2020 1132   ALT 15 05/05/2016 0926   ALT 20 02/08/2013 1642   ALKPHOS 46 03/10/2020 1132   ALKPHOS 75 02/08/2013 1642   BILITOT 1.0 03/10/2020 1132   BILITOT 0.5 11/29/2019 1017   BILITOT 0.7 02/08/2013 1642   GFRNONAA 49 (L) 03/12/2020 0539   GFRNONAA >60 02/08/2013 1642   GFRAA 54 (L) 11/29/2019 1017   GFRAA >60 02/08/2013 1642    glycosylated hemoglobin  Lipid Panel     Component Value Date/Time   CHOL 225 (H) 03/11/2020 0612   CHOL 260 (H) 11/29/2019 1017   CHOL 211 (H) 05/05/2016 0926   TRIG 63 03/11/2020 0612   TRIG 77 05/05/2016 0926   HDL 69 03/11/2020 0612   HDL 69 11/29/2019 1017   CHOLHDL 3.3 03/11/2020 0612   VLDL 13 03/11/2020 0612   VLDL 15 05/05/2016 0926   LDLCALC 143 (H) 03/11/2020 0612   LDLCALC 173 (H) 11/29/2019 1017   A1c 5.7 LDL 143  2D echocardiogram LVEF 55 to 60% with no regional wall motion abnormalities.  Left atrium mildly dilated.  Right atrium mildly dilated.  Aortic valve normal.  Aortic borderline aortic root dilatation. No atrial level shunt detected by color-flow Doppler.  Carotid Dopplers: Unremarkable  Imaging I have  reviewed images in epic and the results pertinent to this consultation are: MRI examination of the brain-acute infarction in the left posterior frontal lobe.  CT head and neck IMPRESSION: 1. Acute left frontal cortical infarct, better characterized on recent MRI. No substantial mass effect. 2. Suspected occlusion of a left distal M2/proximal M3 MCA branch in the region of known infarct. 3. Approximately 4 mm tubular outpouching arising from the right carotid terminus. It is unclear if this is an occluded enlarged lenticulostriate perforator vessel or an aneurysm. 4. No hemodynamically significant stenosis in the neck. 5. Irregularity of bilateral internal carotid arteries  and vertebral arteries at the skull base with somewhat beaded appearance, suggestive of fibromuscular dysplasia. 6. 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  Assessment:  86 year old woman with sudden onset of slurred speech noted to have a left frontal lobe embolic-looking infarct. Continues to have mild dysarthria on examination. No evidence of aphasia.  No LVO signs. MRI brain confirmed the stroke.  CTA head and neck with distal left M2, proximal left M3 MCA branch occlusion in the region of the known infarct-not amenable for intervention due to location and timing. Was not a candidate for TPA due to being outside the window on arrival. CT head and neck has also demonstrated 4 mm outpouching from the right carotid terminus-question occluded/enlarged lenticulostriate versus aneurysm. 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 No acute interventions for the aneurysms needed  Impression: Acute ischemic stroke-suspect cardioembolic stroke. Cardiac monitoring unremarkable thus far-has had an episode of abnormal heart rhythm on the home monitor at 1 point.  Recommendations: High-dose statin atorvastatin 80 for goal LDL less than 70 3  weeks of dual antiplatelets followed by aspirin 81 only. No need for permissive hypertension-blood pressure goal 140/90 or below Follow-up with cardiology and neurology Neurology follow-up with stroke clinic-Dr. Leonie Man at [facility] neurology.  Family provided with information. Please provide referral at the time of discharge Plan discussed w/ Dr Lupita Leash over the phone. Plan also discussed with patient and her daughter at the bedside.  Answered all questions.  Had a discussion about driving.  Have recommended that the patient at least wait a few days before resumption of driving.  The family feels that she is safe for driving.  She has no field deficits or motor or sensory deficits that might preclude her from driving.  I have asked the family to monitor her closely and avoid distractions while driving.  -- Amie Portland, MD [facility] Pager: [phone] If 7pm to 7am, please call on call as listed on AMION.

## 2020-03-12 NOTE — Progress Notes (Signed)
Pt and her daughter were notified to please hit call bell after they receive heart monitor from cardiologist so I could discharge them

## 2020-03-12 NOTE — Progress Notes (Addendum)
  Speech Language Pathology Treatment: Cognitive-Linquistic  Patient Details Name: Cassie Brewer MRN: [id number] DOB: 09/16/1928 Today's Date: 03/12/2020 Time: 7944-4619 SLP Time Calculation (min) (ACUTE ONLY): 25 min  Assessment / Plan / Recommendation Clinical Impression  Skilled treatment session focused on pt's expressive aphasia and continued education with pt and her daughter. SLP facilitated sessoin by providing generative naming acitivites for pt to complete during her day. Pt able to return demonstration of requested information. Education also provided to pt's daughter and all questions answered to their satisfaction.    HPI HPI: Cassie Brewer is a 85 y.o. female with medical history significant for hypertension, hypothyroidism, dyslipidemia and glaucoma who was brought into the ER by family for evaluation of concerns of slurred speech.  Patient and her niece report that 3 days prior to her presentation she had an episode of slurred speech.  Speech has not improved over the last 3 days and her niece states that she is very slow and deliberate when she speaks.  Patient has no weakness, numbness or incoordination.  She denies having any difficulty swallowing, no slurred speech or headache. MRi revealed Small acute cortical infarct within the posterior left frontal lobe, no mass effect or hemorrhage and multiple old infarcts and findings of chronic small vessel disease.                         Oral Care Recommendations: Oral care BID Follow up Recommendations: Outpatient SLP SLP Visit Diagnosis: Aphasia (R47.01)       GO               Kevan Prouty B. Rutherford Nail M.S., CCC-SLP, Center Office [phone]  Stormy Fabian 03/12/2020, 3:35 PM

## 2020-03-12 NOTE — Discharge Summary (Signed)
Physician Discharge Summary  Cassie Brewer XKG:[id number] DOB: 12/26/1928 DOA: 03/10/2020  PCP: Valerie Roys, DO  Admit date: 03/10/2020 Discharge date: 03/12/2020  Admitted From: home Disposition: Home  Recommendations for Outpatient Follow-up:  1. Follow up with PCP in 1-2 weeks 2. Please obtain BMP/CBC in one week 3. Please follow up on the following pending results:  Home Health:no  Equipment/Devices: heart monitor  Discharge Condition: Stable Code Status:   Code Status: Full Code Diet recommendation:  Diet Order            Diet - low sodium heart healthy           Diet 2 gram sodium Room service appropriate? Yes; Fluid consistency: Thin  Diet effective now                 Brief/Interim Summary: 86 year old female with hypertension hypothyroidism, dyslipidemia and glaucoma presents to the ED for evaluation of slurred speech, 3 days prior to her presentation, has not improved over the last 3 days and patient has been very slow and deliberate when she speaks.  She was seen in the ED and was admitted for further management after CT scan of the head was done She was found to have acute ischemic stroke suspect cardioembolic, underwent extensive work-up with A1c LDL echo CT angio neck carotid duplex. Seen by neurology and at this time plan is to discharge home with cardiac monitor, dual antiplatelet for 3 weeks afterwards just aspirin alone and high intensity statin.  Discharge Diagnoses:   Acute Ischemic Stroke-suspect cardioembolic: work up F0Y 5.7, LDL 143.  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  She will go on 3 weeks of dual antiplatelet followed by aspirin 81 mg alone, on Lipitor 80 mg.  Continue blood pressure regimen.  She will get a cardiac monitor prior to discharge.  Discussed with the cardiologist on-call. Hypothyroidism CKD (chronic  kidney disease), stage IIIa-renal function is stable at baseline Hyperlipidemia, well controlled she is started on high-intensity Lipitor Hypothyroidism continue home Synthroid GERD discussed with the patient and her family at the bedside Outpatient speech therapy follow-up Consults:  Neurology  Subjective: Alert awake oriented no acute complaints.  Some difficulty with his speech.  Discharge Exam: Vitals:   03/12/20 0752 03/12/20 1129  BP: (!) 110/59 136/66  Pulse: 82 67  Resp: 17 17  Temp: 98.4 F (36.9 C) 97.8 F (36.6 C)  SpO2: 95% 98%   General: Pt is alert, awake, not in acute distress Cardiovascular: RRR, S1/S2 +, no rubs, no gallops Respiratory: CTA bilaterally, no wheezing, no rhonchi Abdominal: Soft, NT, ND, bowel sounds + Extremities: no edema, no cyanosis  Discharge Instructions  Discharge Instructions    Ambulatory referral to Neurology   Complete by: As directed    An appointment is requested in approximately: call office   Diet - low sodium heart healthy   Complete by: As directed    Discharge instructions   Complete by: As directed    Please follow up with cardiology for heart monitor  Please f/u with pcp and neurology  Please call call MD or return to ER for similar or worsening recurring problem that brought you to hospital or if any fever,nausea/vomiting,abdominal pain, uncontrolled pain, chest pain,  shortness of breath or any other alarming symptoms.  Please follow-up your doctor as instructed in a week time and call the office  for appointment.  Please avoid alcohol, smoking, or any other illicit substance and maintain healthy habits including taking your regular medications as prescribed.  You were cared for by a hospitalist during your hospital stay. If you have any questions about your discharge medications or the care you received while you were in the hospital after you are discharged, you can call the unit and ask to speak with the  hospitalist on call if the hospitalist that took care of you is not available.  Once you are discharged, your primary care physician will handle any further medical issues. Please note that NO REFILLS for any discharge medications will be authorized once you are discharged, as it is imperative that you return to your primary care physician (or establish a relationship with a primary care physician if you do not have one) for your aftercare needs so that they can reassess your need for medications and monitor your lab values   Increase activity slowly   Complete by: As directed      Allergies as of 03/12/2020      Reactions   Amoxicillin    Other reaction(s): Other (See Comments) "blood pressure very high - almost passed out"   Eryc [erythromycin] Other (See Comments)   Sulfa Antibiotics Nausea And Vomiting   Nausea with bactrim in 2017  Nausea with bactrim in 2017       Medication List    TAKE these medications   aspirin 81 MG EC tablet Take 1 tablet (81 mg total) by mouth daily. Swallow whole. Start taking on: March 13, 2020   atorvastatin 80 MG tablet Commonly known as: LIPITOR Take 1 tablet (80 mg total) by mouth daily. Start taking on: March 13, 2020   benazepril 40 MG tablet Commonly known as: LOTENSIN Take 1 tablet (40 mg total) by mouth daily. What changed: when to take this   Calcium 600+D Plus Minerals 600-400 MG-UNIT Tabs Take 1 tablet by mouth as directed.   CENTRUM SILVER ULTRA WOMENS PO Take 1 tablet by mouth daily.   clopidogrel 75 MG tablet Commonly known as: PLAVIX Take 1 tablet (75 mg total) by mouth daily for 21 days. Start taking on: March 13, 2020   famotidine 40 MG tablet Commonly known as: PEPCID TAKE 1 TABLET BY MOUTH EVERYDAY AT BEDTIME What changed: See the new instructions.   Fish Oil 1000 MG Caps Take 1,000 mg by mouth daily.   fluticasone 50 MCG/ACT nasal spray Commonly known as: FLONASE Place 2 sprays into both nostrils  daily as needed for allergies.   hydrochlorothiazide 25 MG tablet Commonly known as: HYDRODIURIL Take 1 tablet (25 mg total) by mouth daily.   latanoprost 0.005 % ophthalmic solution Commonly known as: XALATAN Place 1 drop into both eyes at bedtime.   levothyroxine 112 MCG tablet Commonly known as: SYNTHROID Take 1 tablet (112 mcg total) by mouth daily before breakfast.   timolol 0.5 % ophthalmic solution Commonly known as: TIMOPTIC Place 1 drop into the left eye daily.   vitamin C 500 MG tablet Commonly known as: ASCORBIC ACID Take 500 mg by mouth daily.       Follow-up Information    Park Liter P, DO. Go on 03/20/2020.   Specialty: Family Medicine Why: @ 8am Contact information: [place] [phone]              Allergies  Allergen Reactions  . Amoxicillin     Other reaction(s): Other (See Comments) "blood  pressure very high - almost passed out"  . Eryc [Erythromycin] Other (See Comments)  . Sulfa Antibiotics Nausea And Vomiting    Nausea with bactrim in 2017  Nausea with bactrim in 2017     The results of significant diagnostics from this hospitalization (including imaging, microbiology, ancillary and laboratory) are listed below for reference.    Microbiology: Recent Results (from the past 240 hour(s))  Resp Panel by RT-PCR (Flu A&B, Covid) Nasopharyngeal Swab     Status: None   Collection Time: 03/10/20  1:32 PM   Specimen: Nasopharyngeal Swab; Nasopharyngeal(NP) swabs in vial transport medium  Result Value Ref Range Status   SARS Coronavirus 2 by RT PCR NEGATIVE NEGATIVE Final    Comment: (NOTE) SARS-CoV-2 target nucleic acids are NOT DETECTED.  The SARS-CoV-2 RNA is generally detectable in upper respiratory specimens during the acute phase of infection. The lowest concentration of SARS-CoV-2 viral copies this assay can detect is 138 copies/mL. A negative result does not preclude SARS-Cov-2 infection and should not be  used as the sole basis for treatment or other patient management decisions. A negative result may occur with  improper specimen collection/handling, submission of specimen other than nasopharyngeal swab, presence of viral mutation(s) within the areas targeted by this assay, and inadequate number of viral copies(<138 copies/mL). A negative result must be combined with clinical observations, patient history, and epidemiological information. The expected result is Negative.  Fact Sheet for Patients:  EntrepreneurPulse.com.au  Fact Sheet for Healthcare Providers:  IncredibleEmployment.be  This test is no t yet approved or cleared by the [place] FDA and  has been authorized for detection and/or diagnosis of SARS-CoV-2 by FDA under an Emergency Use Authorization (EUA). This EUA will remain  in effect (meaning this test can be used) for the duration of the COVID-19 declaration under Section 564(b)(1) of the Act, 21 U.S.C.section 360bbb-3(b)(1), unless the authorization is terminated  or revoked sooner.       Influenza A by PCR NEGATIVE NEGATIVE Final   Influenza B by PCR NEGATIVE NEGATIVE Final    Comment: (NOTE) The Xpert Xpress SARS-CoV-2/FLU/RSV plus assay is intended as an aid in the diagnosis of influenza from Nasopharyngeal swab specimens and should not be used as a sole basis for treatment. Nasal washings and aspirates are unacceptable for Xpert Xpress SARS-CoV-2/FLU/RSV testing.  Fact Sheet for Patients: EntrepreneurPulse.com.au  Fact Sheet for Healthcare Providers: IncredibleEmployment.be  This test is not yet approved or cleared by the [place] FDA and has been authorized for detection and/or diagnosis of SARS-CoV-2 by FDA under an Emergency Use Authorization (EUA). This EUA will remain in effect (meaning this test can be used) for the duration of the COVID-19 declaration under Section  564(b)(1) of the Act, 21 U.S.C. section 360bbb-3(b)(1), unless the authorization is terminated or revoked.  Performed at [facility], [address] [place], [place] [address]     Procedures/Studies: CT ANGIO HEAD W OR WO CONTRAST  Result Date: 03/11/2020 CLINICAL DATA:  Neuro deficit, acute stroke suspected. EXAM: CT ANGIOGRAPHY HEAD AND NECK TECHNIQUE: Multidetector CT imaging of the head and neck was performed using the standard protocol during bolus administration of intravenous contrast. Multiplanar CT image reconstructions and MIPs were obtained to evaluate the vascular anatomy. Carotid stenosis measurements (when applicable) are obtained utilizing NASCET criteria, using the distal internal carotid diameter as the denominator. CONTRAST:  82mL OMNIPAQUE IOHEXOL 350 MG/ML SOLN COMPARISON:  MRI head December 13, 21. FINDINGS: CT HEAD FINDINGS Brain: Edema and loss  of gray-white differentiation in the posterior left frontal cortex, compatible with acute infarct was better characterized on recent MRI. No substantial mass effect. No acute hemorrhage. No hydrocephalus. No mass lesion. Vascular: Calcific atherosclerosis. Skull: No acute fracture. Sinuses: Visualized sinuses are clear. Orbits: No acute findings. Review of the MIP images confirms the above findings CTA NECK FINDINGS Aortic arch: Great vessel origins are patent. Right carotid system: No evidence of dissection, stenosis (50% or greater) or occlusion. Mild calcific atherosclerosis at the carotid bifurcation. Left carotid system: No evidence of dissection, stenosis (50% or greater) or occlusion. Mild calcific atherosclerosis at the carotid bifurcation. Irregularity of the internal carotid artery at the skull base with a beaded appearance. Vertebral arteries: Left dominant. No evidence of stenosis (50% or greater) or occlusion. Mildly irregular contour of bilateral vertebral artery at the level of the craniocervical junction Skeleton:  Moderate to severe degenerative disc disease at C4-C5 C5-C6 and C6-C7. No acute findings. Other neck: Post right thyroidectomy. Upper chest: No acute findings. Left upper lobe scarring/atelectasis. Review of the MIP images confirms the above findings CTA HEAD FINDINGS Anterior circulation: No proximal large vessel occlusion. Suspected occlusion of a left distal M2/proximal M3 MCA branch (see series 10, images 285 through 286). Approximately 2 mm wide necked medially directed aneurysm arising from the proximal aspect of the left cavernous carotid artery. There is an approximately 4 mm tubular outpouching arising from the right carotid terminus (see series 10, image 257). Posterior circulation: No significant stenosis, proximal occlusion, aneurysm, or vascular malformation. Mild narrowing of the P2 PCAs. Venous sinuses: As permitted by contrast timing, patent. Review of the MIP images confirms the above findings IMPRESSION: 1. Acute left frontal cortical infarct, better characterized on recent MRI. No substantial mass effect. 2. Suspected occlusion of a left distal M2/proximal M3 MCA branch in the region of known infarct. 3. Approximately 4 mm tubular outpouching arising from the right carotid terminus. It is unclear if this is an occluded enlarged lenticulostriate perforator vessel or an aneurysm. 4. No hemodynamically significant stenosis in the neck. 5. Irregularity of bilateral internal carotid arteries and vertebral arteries at the skull base with somewhat beaded appearance, suggestive of fibromuscular dysplasia. 6. 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 Findings discussed with Dr. Rory Percy at 3:46 PM via telephone. Electronically Signed   By: Margaretha Sheffield MD   On: 03/11/2020 15:52   CT HEAD WO CONTRAST  Result Date: 03/10/2020 CLINICAL DATA:  Slurred speech and dysarthria EXAM: CT HEAD WITHOUT CONTRAST TECHNIQUE: Contiguous axial images were obtained  from the base of the skull through the vertex without intravenous contrast. COMPARISON:  None FINDINGS: Brain: There is mild diffuse atrophy bilaterally with moderate symmetric frontal lobe atrophy bilaterally. There is no intracranial mass, hemorrhage, extra-axial fluid collection, or midline shift. There is mild small vessel disease in the centra semiovale bilaterally. No acute appearing infarct is evident. Vascular: No hyperdense vessel. There is calcification in each carotid siphon region and in the distal left vertebral artery. Skull: Bony calvarium appears intact. Sinuses/Orbits: Visualized paranasal sinuses are clear. Visualized orbits appear symmetric bilaterally. Other: Visualized mastoid air cells are clear. IMPRESSION: Moderate symmetric frontal lobe atrophy bilaterally. Mild diffuse atrophy elsewhere. Patchy small vessel disease in the periventricular white matter. No acute infarct. No mass or hemorrhage. There are foci of arterial vascular calcification at several sites. Electronically Signed   By: Lowella Grip III M.D.   On: 03/10/2020 11:20   CT ANGIO NECK  W OR WO CONTRAST  Result Date: 03/11/2020 CLINICAL DATA:  Neuro deficit, acute stroke suspected. EXAM: CT ANGIOGRAPHY HEAD AND NECK TECHNIQUE: Multidetector CT imaging of the head and neck was performed using the standard protocol during bolus administration of intravenous contrast. Multiplanar CT image reconstructions and MIPs were obtained to evaluate the vascular anatomy. Carotid stenosis measurements (when applicable) are obtained utilizing NASCET criteria, using the distal internal carotid diameter as the denominator. CONTRAST:  71mL OMNIPAQUE IOHEXOL 350 MG/ML SOLN COMPARISON:  MRI head December 13, 21. FINDINGS: CT HEAD FINDINGS Brain: Edema and loss of gray-white differentiation in the posterior left frontal cortex, compatible with acute infarct was better characterized on recent MRI. No substantial mass effect. No acute  hemorrhage. No hydrocephalus. No mass lesion. Vascular: Calcific atherosclerosis. Skull: No acute fracture. Sinuses: Visualized sinuses are clear. Orbits: No acute findings. Review of the MIP images confirms the above findings CTA NECK FINDINGS Aortic arch: Great vessel origins are patent. Right carotid system: No evidence of dissection, stenosis (50% or greater) or occlusion. Mild calcific atherosclerosis at the carotid bifurcation. Left carotid system: No evidence of dissection, stenosis (50% or greater) or occlusion. Mild calcific atherosclerosis at the carotid bifurcation. Irregularity of the internal carotid artery at the skull base with a beaded appearance. Vertebral arteries: Left dominant. No evidence of stenosis (50% or greater) or occlusion. Mildly irregular contour of bilateral vertebral artery at the level of the craniocervical junction Skeleton: Moderate to severe degenerative disc disease at C4-C5 C5-C6 and C6-C7. No acute findings. Other neck: Post right thyroidectomy. Upper chest: No acute findings. Left upper lobe scarring/atelectasis. Review of the MIP images confirms the above findings CTA HEAD FINDINGS Anterior circulation: No proximal large vessel occlusion. Suspected occlusion of a left distal M2/proximal M3 MCA branch (see series 10, images 285 through 286). Approximately 2 mm wide necked medially directed aneurysm arising from the proximal aspect of the left cavernous carotid artery. There is an approximately 4 mm tubular outpouching arising from the right carotid terminus (see series 10, image 257). Posterior circulation: No significant stenosis, proximal occlusion, aneurysm, or vascular malformation. Mild narrowing of the P2 PCAs. Venous sinuses: As permitted by contrast timing, patent. Review of the MIP images confirms the above findings IMPRESSION: 1. Acute left frontal cortical infarct, better characterized on recent MRI. No substantial mass effect. 2. Suspected occlusion of a left  distal M2/proximal M3 MCA branch in the region of known infarct. 3. Approximately 4 mm tubular outpouching arising from the right carotid terminus. It is unclear if this is an occluded enlarged lenticulostriate perforator vessel or an aneurysm. 4. No hemodynamically significant stenosis in the neck. 5. Irregularity of bilateral internal carotid arteries and vertebral arteries at the skull base with somewhat beaded appearance, suggestive of fibromuscular dysplasia. 6. 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 Findings discussed with Dr. Rory Percy at 3:46 PM via telephone. Electronically Signed   By: Margaretha Sheffield MD   On: 03/11/2020 15:52   MR BRAIN WO CONTRAST  Result Date: 03/10/2020 CLINICAL DATA:  Headache and neurologic deficit EXAM: MRI HEAD WITHOUT CONTRAST TECHNIQUE: Multiplanar, multiecho pulse sequences of the brain and surrounding structures were obtained without intravenous contrast. COMPARISON:  Head CT 03/10/2020 FINDINGS: Brain: Small cortical infarct within the posterior left frontal lobe. No mass effect. There are old infarcts of the left cerebellum and left caudate head. No acute or chronic hemorrhage. There is multifocal hyperintense T2-weighted signal within the white matter. Generalized volume loss without  a clear lobar predilection. The midline structures are normal. Vascular: Major flow voids are preserved. Skull and upper cervical spine: Normal calvarium and skull base. Visualized upper cervical spine and soft tissues are normal. Sinuses/Orbits:No paranasal sinus fluid levels or advanced mucosal thickening. No mastoid or middle ear effusion. Normal orbits. IMPRESSION: 1. Small acute cortical infarct within the posterior left frontal lobe. No mass effect or hemorrhage. 2. Multiple old infarcts and findings of chronic small vessel disease. Electronically Signed   By: Ulyses Jarred M.D.   On: 03/10/2020 19:09   US Carotid Bilateral (at  [facility] and [facility] only)  Result Date: 03/10/2020 CLINICAL DATA:  86 year old female with history of carotid artery disease. EXAM: BILATERAL CAROTID DUPLEX ULTRASOUND TECHNIQUE: Pearline Cables scale imaging, color Doppler and duplex ultrasound were performed of bilateral carotid and vertebral arteries in the neck. COMPARISON:  None. FINDINGS: Criteria: Quantification of carotid stenosis is based on velocity parameters that correlate the residual internal carotid diameter with NASCET-based stenosis levels, using the diameter of the distal internal carotid lumen as the denominator for stenosis measurement. The following velocity measurements were obtained: RIGHT ICA: Peak systolic velocity 84 cm/sec, End diastolic velocity 19 cm/sec CCA: Peak systolic velocity 91 cm/sec SYSTOLIC ICA/CCA RATIO:  0.9 ECA: Peak systolic velocity 87 cm/sec LEFT ICA: Peak systolic velocity 80 cm/sec, End diastolic velocity 32 cm/sec CCA: 90 cm/sec SYSTOLIC ICA/CCA RATIO:  0.9 ECA: 92 cm/sec RIGHT CAROTID ARTERY: No atherosclerotic plaque formation. No significant tortuosity. Normal low resistance waveforms. RIGHT VERTEBRAL ARTERY:  Antegrade flow. LEFT CAROTID ARTERY: No atherosclerotic plaque formation. No significant tortuosity. Normal low resistance waveforms. LEFT VERTEBRAL ARTERY:  Antegrade flow. Upper extremity non-invasive blood pressures: Not obtained. IMPRESSION: 1. Right carotid artery system: Patent without significant atherosclerotic plaque formation. 2. Left carotid artery system: Patent without significant atherosclerotic plaque formation. 3.  Vertebral artery system: Patent with antegrade flow bilaterally. Ruthann Cancer, MD [facility] Electronically Signed   By: Ruthann Cancer MD   On: 03/10/2020 16:11   ECHOCARDIOGRAM COMPLETE  Result Date: 03/11/2020    ECHOCARDIOGRAM REPORT   Patient Name:   Cassie Brewer Date of Exam: 03/10/2020 Medical Rec #:  [id number]      Height:        63.0 in Accession #:    [id number]     Weight:       150.4 lb Date of Birth:  Aug 20, 1928      BSA:          1.713 m Patient Age:    43 years       BP:           147/88 mmHg Patient Gender: F              HR:           72 bpm. Exam Location:  [facility] Procedure: 2D Echo, Color Doppler and Cardiac Doppler Indications:     Stroke 434.91 / I163.9  History:         Patient has no prior history of Echocardiogram examinations.                  Risk Factors:Hypertension.  Sonographer:     Alyse Low Roar Referring Phys:  [place] Diagnosing Phys: Yolonda Kida MD IMPRESSIONS  1. Left ventricular ejection fraction, by estimation, is 55 to 60%. The left ventricle has normal function. The left ventricle has no regional wall motion abnormalities. Left ventricular diastolic parameters were normal.  2. Right ventricular systolic function is normal. The right ventricular size is normal.  3. Left atrial size was mildly dilated.  4. Right atrial size was mildly dilated.  5. The mitral valve is normal in structure. Trivial mitral valve regurgitation.  6. The aortic valve is normal in structure. Aortic valve regurgitation is not visualized. Mild aortic valve sclerosis is present, with no evidence of aortic valve stenosis.  7. Aortic Borderline Ao Root dilatation. There is borderline dilatation of the ascending aorta. FINDINGS  Left Ventricle: Left ventricular ejection fraction, by estimation, is 55 to 60%. The left ventricle has normal function. The left ventricle has no regional wall motion abnormalities. The left ventricular internal cavity size was normal in size. There is  no left ventricular hypertrophy. Left ventricular diastolic parameters were normal. Right Ventricle: The right ventricular size is normal. No increase in right ventricular wall thickness. Right ventricular systolic function is normal. Left Atrium: Left atrial size was mildly dilated. Right Atrium: Right atrial size was mildly dilated. Pericardium:  There is no evidence of pericardial effusion. Mitral Valve: The mitral valve is normal in structure. Trivial mitral valve regurgitation. Tricuspid Valve: The tricuspid valve is normal in structure. Tricuspid valve regurgitation is trivial. Aortic Valve: The aortic valve is normal in structure. Aortic valve regurgitation is not visualized. Mild aortic valve sclerosis is present, with no evidence of aortic valve stenosis. Aortic valve peak gradient measures 5.9 mmHg. Pulmonic Valve: The pulmonic valve was normal in structure. Pulmonic valve regurgitation is not visualized. Aorta: Borderline Ao Root dilatation. There is borderline dilatation of the ascending aorta. IAS/Shunts: No atrial level shunt detected by color flow Doppler.  LEFT VENTRICLE PLAX 2D LVIDd:         4.30 cm  Diastology LVIDs:         3.07 cm  LV e' medial:    5.33 cm/s LV PW:         1.14 cm  LV E/e' medial:  15.7 LV IVS:        1.20 cm  LV e' lateral:   5.87 cm/s LVOT diam:     1.70 cm  LV E/e' lateral: 14.2 LVOT Area:     2.27 cm  RIGHT VENTRICLE RV Mid diam:    3.47 cm LEFT ATRIUM             Index       RIGHT ATRIUM           Index LA diam:        4.20 cm 2.45 cm/m  RA Area:     18.10 cm LA Vol (A2C):   73.2 ml 42.74 ml/m RA Volume:   43.90 ml  25.63 ml/m LA Vol (A4C):   58.2 ml 33.98 ml/m LA Biplane Vol: 66.0 ml 38.53 ml/m  AORTIC VALVE                PULMONIC VALVE AV Area (Vmax): 1.24 cm    PV Vmax:        0.81 m/s AV Vmax:        121.00 cm/s PV Peak grad:   2.7 mmHg AV Peak Grad:   5.9 mmHg    RVOT Peak grad: 1 mmHg LVOT Vmax:      66.00 cm/s  AORTA Ao Root diam: 3.40 cm MITRAL VALVE               TRICUSPID VALVE MV Area (PHT): 3.99 cm    TR Peak grad:   32.7  mmHg MV Decel Time: 190 msec    TR Vmax:        286.00 cm/s MV E velocity: 83.60 cm/s MV A velocity: 67.30 cm/s  SHUNTS MV E/A ratio:  1.24        Systemic Diam: 1.70 cm MV A Prime:    5.3 cm/s Dwayne D Callwood MD Electronically signed by Yolonda Kida MD Signature  Date/Time: 03/11/2020/6:54:15 AM    Final     Labs: BNP (last 3 results) No results for input(s): BNP in the last 8760 hours. Basic Metabolic Panel: Recent Labs  Lab 03/10/20 1132 03/12/20 0539  NA 137 137  K 3.8 3.8  CL 102 102  CO2 25 25  GLUCOSE 111* 112*  BUN 25* 26*  CREATININE 0.98 1.09*  CALCIUM 9.8 9.0   Liver Function Tests: Recent Labs  Lab 03/10/20 1132  AST 17  ALT 14  ALKPHOS 46  BILITOT 1.0  PROT 7.9  ALBUMIN 4.0   No results for input(s): LIPASE, AMYLASE in the last 168 hours. No results for input(s): AMMONIA in the last 168 hours. CBC: Recent Labs  Lab 03/10/20 1132  WBC 5.8  NEUTROABS 3.9  HGB 12.7  HCT 36.2  MCV 87.0  PLT 318   Cardiac Enzymes: No results for input(s): CKTOTAL, CKMB, CKMBINDEX, TROPONINI in the last 168 hours. BNP: Invalid input(s): POCBNP CBG: Recent Labs  Lab 03/10/20 1131  GLUCAP 109*   D-Dimer No results for input(s): DDIMER in the last 72 hours. Hgb A1c Recent Labs    03/11/20 0612  HGBA1C 5.7*   Lipid Profile Recent Labs    03/11/20 0612  CHOL 225*  HDL 69  LDLCALC 143*  TRIG 63  CHOLHDL 3.3   Thyroid function studies No results for input(s): TSH, T4TOTAL, T3FREE, THYROIDAB in the last 72 hours.  Invalid input(s): FREET3 Anemia work up No results for input(s): VITAMINB12, FOLATE, FERRITIN, TIBC, IRON, RETICCTPCT in the last 72 hours. Urinalysis    Component Value Date/Time   COLORURINE Yellow 02/08/2013 1642   APPEARANCEUR Clear 11/29/2019 1008   LABSPEC 1.017 02/08/2013 1642   PHURINE 7.0 02/08/2013 1642   GLUCOSEU Negative 11/29/2019 1008   GLUCOSEU Negative 02/08/2013 1642   HGBUR Negative 02/08/2013 1642   BILIRUBINUR Negative 11/29/2019 1008   BILIRUBINUR Negative 02/08/2013 1642   KETONESUR Trace 02/08/2013 1642   PROTEINUR Negative 11/29/2019 1008   PROTEINUR 30 mg/dL 02/08/2013 1642   NITRITE Negative 11/29/2019 1008   NITRITE Negative 02/08/2013 1642   LEUKOCYTESUR Negative  11/29/2019 1008   LEUKOCYTESUR 2+ 02/08/2013 1642   Sepsis Labs Invalid input(s): PROCALCITONIN,  WBC,  LACTICIDVEN Microbiology Recent Results (from the past 240 hour(s))  Resp Panel by RT-PCR (Flu A&B, Covid) Nasopharyngeal Swab     Status: None   Collection Time: 03/10/20  1:32 PM   Specimen: Nasopharyngeal Swab; Nasopharyngeal(NP) swabs in vial transport medium  Result Value Ref Range Status   SARS Coronavirus 2 by RT PCR NEGATIVE NEGATIVE Final    Comment: (NOTE) SARS-CoV-2 target nucleic acids are NOT DETECTED.  The SARS-CoV-2 RNA is generally detectable in upper respiratory specimens during the acute phase of infection. The lowest concentration of SARS-CoV-2 viral copies this assay can detect is 138 copies/mL. A negative result does not preclude SARS-Cov-2 infection and should not be used as the sole basis for treatment or other patient management decisions. A negative result may occur with  improper specimen collection/handling, submission of specimen other than nasopharyngeal swab, presence of viral  mutation(s) within the areas targeted by this assay, and inadequate number of viral copies(<138 copies/mL). A negative result must be combined with clinical observations, patient history, and epidemiological information. The expected result is Negative.  Fact Sheet for Patients:  EntrepreneurPulse.com.au  Fact Sheet for Healthcare Providers:  IncredibleEmployment.be  This test is no t yet approved or cleared by the [place] FDA and  has been authorized for detection and/or diagnosis of SARS-CoV-2 by FDA under an Emergency Use Authorization (EUA). This EUA will remain  in effect (meaning this test can be used) for the duration of the COVID-19 declaration under Section 564(b)(1) of the Act, 21 U.S.C.section 360bbb-3(b)(1), unless the authorization is terminated  or revoked sooner.       Influenza A by PCR NEGATIVE NEGATIVE Final    Influenza B by PCR NEGATIVE NEGATIVE Final    Comment: (NOTE) The Xpert Xpress SARS-CoV-2/FLU/RSV plus assay is intended as an aid in the diagnosis of influenza from Nasopharyngeal swab specimens and should not be used as a sole basis for treatment. Nasal washings and aspirates are unacceptable for Xpert Xpress SARS-CoV-2/FLU/RSV testing.  Fact Sheet for Patients: EntrepreneurPulse.com.au  Fact Sheet for Healthcare Providers: IncredibleEmployment.be  This test is not yet approved or cleared by the [place] FDA and has been authorized for detection and/or diagnosis of SARS-CoV-2 by FDA under an Emergency Use Authorization (EUA). This EUA will remain in effect (meaning this test can be used) for the duration of the COVID-19 declaration under Section 564(b)(1) of the Act, 21 U.S.C. section 360bbb-3(b)(1), unless the authorization is terminated or revoked.  Performed at [facility], [address] [place], [place] [address]      Time coordinating discharge: 35 minutes  SIGNED: Antonieta Pert, MD  [facility] 03/12/2020, 5:21 PM  If 7PM-7AM, please contact night-coverage [email or web address]

## 2020-03-13 ENCOUNTER — Telehealth: Payer: Self-pay

## 2020-03-13 NOTE — Telephone Encounter (Signed)
Transition Care Management Follow-up Telephone Call with daughter Vaughan Basta  Date of discharge and from where: !05/14/2019 [facility]  How have you been since you were released from the hospital? ok  Any questions or concerns? No  Items Reviewed:  Did the pt receive and understand the discharge instructions provided? Yes   Medications obtained and verified? Yes   Other? No   Any new allergies since your discharge? No   Dietary orders reviewed? Yes  Do you have support at home? Yes   Home Care and Equipment/Supplies: Were home health services ordered? not applicable If so, what is the name of the agency? n/a  Has the agency set up a time to come to the patient's home? not applicable Were any new equipment or medical supplies ordered?  Yes: heart monitor What is the name of the medical supply agency?  Were you able to get the supplies/equipment? yes Do you have any questions related to the use of the equipment or supplies? No  Functional Questionnaire: (I = Independent and D = Dependent) ADLs: i  Bathing/Dressing- I  Meal Prep- D  Eating- I  Maintaining continence- I  Transferring/Ambulation- I  Managing Meds- D  Follow up appointments reviewed:   PCP Hospital f/u appt confirmed? Yes  Scheduled to see Dr. Wynetta Emery on 03/20/2020 @ 8:00.  Are transportation arrangements needed? No   If their condition worsens, is the pt aware to call PCP or go to the Emergency Dept.? Yes  Was the patient provided with contact information for the PCP's office or ED? Yes  Was to pt encouraged to call back with questions or concerns? Yes

## 2020-03-13 NOTE — Telephone Encounter (Signed)
Transition Care Management Unsuccessful Follow-up Telephone Call  Date of discharge and from where:  03/12/2020 [facility]  Attempts:  1st Attempt  Reason for unsuccessful TCM follow-up call:  Left voice message

## 2020-03-20 ENCOUNTER — Ambulatory Visit (INDEPENDENT_AMBULATORY_CARE_PROVIDER_SITE_OTHER): Payer: PPO | Admitting: Family Medicine

## 2020-03-20 ENCOUNTER — Encounter: Payer: Self-pay | Admitting: Family Medicine

## 2020-03-20 ENCOUNTER — Other Ambulatory Visit: Payer: Self-pay

## 2020-03-20 VITALS — BP 120/71 | HR 64 | Temp 98.1°F | Wt 148.0 lb

## 2020-03-20 DIAGNOSIS — I69320 Aphasia following cerebral infarction: Secondary | ICD-10-CM

## 2020-03-20 MED ORDER — HYDROCORTISONE (PERIANAL) 2.5 % EX CREA: 1.0000 "application " | TOPICAL_CREAM | Freq: Two times a day (BID) | CUTANEOUS | 12 refills | Status: DC

## 2020-03-20 NOTE — Patient Instructions (Signed)
Drs. Melrose Nakayama and [facility]-  Phone: [phone]

## 2020-03-20 NOTE — Progress Notes (Signed)
BP 120/71   Pulse 64   Temp 98.1 F (36.7 C)   Wt 148 lb (67.1 kg)   LMP 05/13/1980   SpO2 95%   BMI (P) 26.22 kg/m    Subjective:    Patient ID: Cassie Brewer, female    DOB: 1929/03/20, 86 y.o.   MRN: [id number]  HPI: Cassie Brewer is a 86 y.o. female  Chief Complaint  Patient presents with  . Hospitalization Follow-up    Pt was having difficulty speaking on 12/10 was admitted into hospital on 12/13 and discharged on 12/15. Pt was told she had a stroke.   . life alert     Pt daughter is interested in life alert    Transition of [facility] Follow up.   Hospital/Facility: [facility] D/C Physician: Dr. Lupita Leash D/C Date: 03/12/20  Records Requested: 03/20/20 Records Received:  03/20/20 Records Reviewed:  03/20/20  Diagnoses on Discharge: Acute Ischemic Stroke-suspect cardioembolic: work up 123456 5.7, LDL 143.  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  She will go on 3 weeks of dual antiplatelet followed by aspirin 81 mg alone, on Lipitor 80 mg.  Continue blood pressure regimen.  She will get a cardiac monitor prior to discharge.  Discussed with the cardiologist on-call. Hypothyroidism CKD (chronic kidney disease), stage IIIa-renal function is stable at baseline Hyperlipidemia, well controlled she is started on high-intensity Lipitor Hypothyroidism continue home Synthroid GERD discussed with the patient and her family at the bedside Outpatient speech therapy follow-up  Date of interactive Contact within 48 hours of discharge:  03/13/20 Contact was through: phone  Date of 7 day or 14 day face-to-face visit:   03/20/20  within 14 days  Outpatient Encounter Medications as of 03/20/2020  Medication Sig  . aspirin EC 81 MG EC tablet Take 1 tablet (81 mg total) by mouth daily. Swallow whole.  Marland Kitchen atorvastatin (LIPITOR) 80 MG tablet Take 1 tablet (80 mg total)  by mouth daily.  . benazepril (LOTENSIN) 40 MG tablet Take 1 tablet (40 mg total) by mouth daily. (Patient taking differently: Take 40 mg by mouth every evening.)  . Calcium Carbonate-Vit D-Min (CALCIUM 600+D PLUS MINERALS) 600-400 MG-UNIT TABS Take 1 tablet by mouth as directed.  . clopidogrel (PLAVIX) 75 MG tablet Take 1 tablet (75 mg total) by mouth daily for 21 days.  . famotidine (PEPCID) 40 MG tablet TAKE 1 TABLET BY MOUTH EVERYDAY AT BEDTIME (Patient taking differently: Take 40 mg by mouth at bedtime as needed for heartburn.)  . hydrochlorothiazide (HYDRODIURIL) 25 MG tablet Take 1 tablet (25 mg total) by mouth daily.  Marland Kitchen latanoprost (XALATAN) 0.005 % ophthalmic solution Place 1 drop into both eyes at bedtime.  Marland Kitchen levothyroxine (SYNTHROID) 112 MCG tablet Take 1 tablet (112 mcg total) by mouth daily before breakfast.  . Multiple Vitamins-Minerals (CENTRUM SILVER ULTRA WOMENS PO) Take 1 tablet by mouth daily.  . Omega-3 Fatty Acids (FISH OIL) 1000 MG CAPS Take 1,000 mg by mouth daily.  . timolol (TIMOPTIC) 0.5 % ophthalmic solution Place 1 drop into the left eye daily.  . vitamin C (ASCORBIC ACID) 500 MG tablet Take 500 mg by mouth daily.  . fluticasone (FLONASE) 50 MCG/ACT nasal spray Place 2 sprays into both nostrils daily as needed for allergies. (Patient not taking: Reported on 03/20/2020)  . hydrocortisone (ANUSOL-HC) 2.5 % rectal cream Place 1 application rectally 2 (two)  times daily.   No facility-administered encounter medications on file as of 03/20/2020.  Per hospitalist: "Brief/Interim Summary: 86 year old female with hypertension hypothyroidism, dyslipidemia and glaucoma presents to the ED for evaluation of slurred speech, 3 days prior to her presentation, has not improved over the last 3 days and patient has been very slow and deliberate when she speaks.  She was seen in the ED and was admitted for further management after CT scan of the head was done She was found to have acute  ischemic stroke suspect cardioembolic, underwent extensive work-up with A1c LDL echo CT angio neck carotid duplex. Seen by neurology and at this time plan is to discharge home with cardiac monitor, dual antiplatelet for 3 weeks afterwards just aspirin alone and high intensity statin."  Diagnostic Tests Reviewed CLINICAL DATA:  Slurred speech and dysarthria  EXAM: CT HEAD WITHOUT CONTRAST  TECHNIQUE: Contiguous axial images were obtained from the base of the skull through the vertex without intravenous contrast.  COMPARISON:  None  FINDINGS: Brain: There is mild diffuse atrophy bilaterally with moderate symmetric frontal lobe atrophy bilaterally. There is no intracranial mass, hemorrhage, extra-axial fluid collection, or midline shift. There is mild small vessel disease in the centra semiovale bilaterally. No acute appearing infarct is evident.  Vascular: No hyperdense vessel. There is calcification in each carotid siphon region and in the distal left vertebral artery.  Skull: Bony calvarium appears intact.  Sinuses/Orbits: Visualized paranasal sinuses are clear. Visualized orbits appear symmetric bilaterally.  Other: Visualized mastoid air cells are clear.  IMPRESSION: Moderate symmetric frontal lobe atrophy bilaterally. Mild diffuse atrophy elsewhere. Patchy small vessel disease in the periventricular white matter. No acute infarct. No mass or hemorrhage.  There are foci of arterial vascular calcification at several sites.  CLINICAL DATA:  86 year old female with history of carotid artery disease.  EXAM: BILATERAL CAROTID DUPLEX ULTRASOUND  TECHNIQUE: Pearline Cables scale imaging, color Doppler and duplex ultrasound were performed of bilateral carotid and vertebral arteries in the neck.  COMPARISON:  None.  FINDINGS: Criteria: Quantification of carotid stenosis is based on velocity parameters that correlate the residual internal carotid diameter with  NASCET-based stenosis levels, using the diameter of the distal internal carotid lumen as the denominator for stenosis measurement.  The following velocity measurements were obtained:  RIGHT  ICA: Peak systolic velocity 84 cm/sec, End diastolic velocity 19 cm/sec  CCA: Peak systolic velocity 91 cm/sec  SYSTOLIC ICA/CCA RATIO:  0.9  ECA: Peak systolic velocity 87 cm/sec  LEFT  ICA: Peak systolic velocity 80 cm/sec, End diastolic velocity 32 cm/sec  CCA: 90 cm/sec  SYSTOLIC ICA/CCA RATIO:  0.9  ECA: 92 cm/sec  RIGHT CAROTID ARTERY: No atherosclerotic plaque formation. No significant tortuosity. Normal low resistance waveforms.  RIGHT VERTEBRAL ARTERY:  Antegrade flow.  LEFT CAROTID ARTERY: No atherosclerotic plaque formation. No significant tortuosity. Normal low resistance waveforms.  LEFT VERTEBRAL ARTERY:  Antegrade flow.  Upper extremity non-invasive blood pressures:  Not obtained.  IMPRESSION: 1. Right carotid artery system: Patent without significant atherosclerotic plaque formation.  2. Left carotid artery system: Patent without significant atherosclerotic plaque formation.  3.  Vertebral artery system: Patent with antegrade flow bilaterally.  CLINICAL DATA:  Headache and neurologic deficit  EXAM: MRI HEAD WITHOUT CONTRAST  TECHNIQUE: Multiplanar, multiecho pulse sequences of the brain and surrounding structures were obtained without intravenous contrast.  COMPARISON:  Head CT 03/10/2020  FINDINGS: Brain: Small cortical infarct within the posterior left frontal lobe. No mass effect. There are old infarcts of the  left cerebellum and left caudate head. No acute or chronic hemorrhage. There is multifocal hyperintense T2-weighted signal within the white matter. Generalized volume loss without a clear lobar predilection. The midline structures are normal.  Vascular: Major flow voids are preserved.  Skull and upper  cervical spine: Normal calvarium and skull base. Visualized upper cervical spine and soft tissues are normal.  Sinuses/Orbits:No paranasal sinus fluid levels or advanced mucosal thickening. No mastoid or middle ear effusion. Normal orbits.  IMPRESSION: 1. Small acute cortical infarct within the posterior left frontal lobe. No mass effect or hemorrhage. 2. Multiple old infarcts and findings of chronic small vessel Disease.  CLINICAL DATA:  Neuro deficit, acute stroke suspected.  EXAM: CT ANGIOGRAPHY HEAD AND NECK  TECHNIQUE: Multidetector CT imaging of the head and neck was performed using the standard protocol during bolus administration of intravenous contrast. Multiplanar CT image reconstructions and MIPs were obtained to evaluate the vascular anatomy. Carotid stenosis measurements (when applicable) are obtained utilizing NASCET criteria, using the distal internal carotid diameter as the denominator.  CONTRAST:  52mL OMNIPAQUE IOHEXOL 350 MG/ML SOLN  COMPARISON:  MRI head December 13, 21.  FINDINGS: CT HEAD FINDINGS  Brain: Edema and loss of gray-white differentiation in the posterior left frontal cortex, compatible with acute infarct was better characterized on recent MRI. No substantial mass effect. No acute hemorrhage. No hydrocephalus. No mass lesion.  Vascular: Calcific atherosclerosis.  Skull: No acute fracture.  Sinuses: Visualized sinuses are clear.  Orbits: No acute findings.  Review of the MIP images confirms the above findings  CTA NECK FINDINGS  Aortic arch: Great vessel origins are patent.  Right carotid system: No evidence of dissection, stenosis (50% or greater) or occlusion. Mild calcific atherosclerosis at the carotid bifurcation.  Left carotid system: No evidence of dissection, stenosis (50% or greater) or occlusion. Mild calcific atherosclerosis at the carotid bifurcation. Irregularity of the internal carotid artery at  the skull base with a beaded appearance.  Vertebral arteries: Left dominant. No evidence of stenosis (50% or greater) or occlusion. Mildly irregular contour of bilateral vertebral artery at the level of the craniocervical junction  Skeleton: Moderate to severe degenerative disc disease at C4-C5 C5-C6 and C6-C7. No acute findings.  Other neck: Post right thyroidectomy.  Upper chest: No acute findings. Left upper lobe scarring/atelectasis.  Review of the MIP images confirms the above findings  CTA HEAD FINDINGS  Anterior circulation: No proximal large vessel occlusion. Suspected occlusion of a left distal M2/proximal M3 MCA branch (see series 10, images 285 through 286). Approximately 2 mm wide necked medially directed aneurysm arising from the proximal aspect of the left cavernous carotid artery. There is an approximately 4 mm tubular outpouching arising from the right carotid terminus (see series 10, image 257).  Posterior circulation: No significant stenosis, proximal occlusion, aneurysm, or vascular malformation. Mild narrowing of the P2 PCAs.  Venous sinuses: As permitted by contrast timing, patent.  Review of the MIP images confirms the above findings  IMPRESSION: 1. Acute left frontal cortical infarct, better characterized on recent MRI. No substantial mass effect. 2. Suspected occlusion of a left distal M2/proximal M3 MCA branch in the region of known infarct. 3. Approximately 4 mm tubular outpouching arising from the right carotid terminus. It is unclear if this is an occluded enlarged lenticulostriate perforator vessel or an aneurysm. 4. No hemodynamically significant stenosis in the neck. 5. Irregularity of bilateral internal carotid arteries and vertebral arteries at the skull base with somewhat beaded appearance, suggestive of fibromuscular  dysplasia. 6. 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  Disposition: Home  Consults: Neurology  Discharge Instructions: Follow up here and with cardiology and neurology  Disease/illness Education: Discussed today  Home Health/Community Services Discussions/Referrals: In [employer] or re-establishment of referral orders for community resources: In Place  Discussion with other health care providers: None  Assessment and Support of treatment regimen adherence: Excellent  Appointments Coordinated with: Patient and daughter  Education for self-management, independent living, and ADLs: Discussed today  Cassie Brewer has been doing pretty well. She notes that she didn't go to the ER for 2 days after she started having symptoms. She notes that she has been doing quite well since getting out of the hospital. She does still have some issues with her speech, but she does feel like it has been getting better. She notes that she has not had any weakness, numbness or confusion. The only issue she is having is with her speech. She notes that she is going to start speech therapy after the holidays. She has an appointment with cardiology scheduled, but doesn't want to go out to [facility] for her neurology appointment. She would like to see someone in [place]. She is otherwise feeling well with no other concerns or complaints at this time.   Relevant past medical, surgical, family and social history reviewed and updated as indicated. Interim medical history since our last visit reviewed. Allergies and medications reviewed and updated.  Review of Systems  Constitutional: Negative.   Respiratory: Negative.   Cardiovascular: Negative.   Gastrointestinal: Negative.   Musculoskeletal: Negative.   Neurological: Positive for speech difficulty. Negative for dizziness, tremors, seizures, syncope, facial asymmetry, weakness, light-headedness, numbness and headaches.  Hematological: Negative.   Psychiatric/Behavioral: Negative.     Per HPI unless  specifically indicated above     Objective:    BP 120/71   Pulse 64   Temp 98.1 F (36.7 C)   Wt 148 lb (67.1 kg)   LMP 05/13/1980   SpO2 95%   BMI (P) 26.22 kg/m   Wt Readings from Last 3 Encounters:  03/20/20 148 lb (67.1 kg)  03/10/20 (P) 147 lb 8 oz (66.9 kg)  11/29/19 150 lb 6.4 oz (68.2 kg)    Physical Exam  Results for orders placed or performed during the hospital encounter of 03/10/20  Resp Panel by RT-PCR (Flu A&B, Covid) Nasopharyngeal Swab   Specimen: Nasopharyngeal Swab; Nasopharyngeal(NP) swabs in vial transport medium  Result Value Ref Range   SARS Coronavirus 2 by RT PCR NEGATIVE NEGATIVE   Influenza A by PCR NEGATIVE NEGATIVE   Influenza B by PCR NEGATIVE NEGATIVE  Protime-INR  Result Value Ref Range   Prothrombin Time 13.4 11.4 - 15.2 seconds   INR 1.1 0.8 - 1.2  APTT  Result Value Ref Range   aPTT 34 24 - 36 seconds  CBC  Result Value Ref Range   WBC 5.8 4.0 - 10.5 K/uL   RBC 4.16 3.87 - 5.11 MIL/uL   Hemoglobin 12.7 12.0 - 15.0 g/dL   HCT 36.2 36.0 - 46.0 %   MCV 87.0 80.0 - 100.0 fL   MCH 30.5 26.0 - 34.0 pg   MCHC 35.1 30.0 - 36.0 g/dL   RDW 13.6 11.5 - 15.5 %   Platelets 318 150 - 400 K/uL   nRBC 0.0 0.0 - 0.2 %  Differential  Result Value Ref Range   Neutrophils Relative % 67 %   Neutro  Abs 3.9 1.7 - 7.7 K/uL   Lymphocytes Relative 21 %   Lymphs Abs 1.2 0.7 - 4.0 K/uL   Monocytes Relative 10 %   Monocytes Absolute 0.6 0.1 - 1.0 K/uL   Eosinophils Relative 1 %   Eosinophils Absolute 0.1 0.0 - 0.5 K/uL   Basophils Relative 1 %   Basophils Absolute 0.1 0.0 - 0.1 K/uL   Immature Granulocytes 0 %   Abs Immature Granulocytes 0.02 0.00 - 0.07 K/uL  Comprehensive metabolic panel  Result Value Ref Range   Sodium 137 135 - 145 mmol/L   Potassium 3.8 3.5 - 5.1 mmol/L   Chloride 102 98 - 111 mmol/L   CO2 25 22 - 32 mmol/L   Glucose, Bld 111 (H) 70 - 99 mg/dL   BUN 25 (H) 8 - 23 mg/dL   Creatinine, Ser 0.98 0.44 - 1.00 mg/dL   Calcium  9.8 8.9 - 10.3 mg/dL   Total Protein 7.9 6.5 - 8.1 g/dL   Albumin 4.0 3.5 - 5.0 g/dL   AST 17 15 - 41 U/L   ALT 14 0 - 44 U/L   Alkaline Phosphatase 46 38 - 126 U/L   Total Bilirubin 1.0 0.3 - 1.2 mg/dL   GFR, Estimated 55 (L) >60 mL/min   Anion gap 10 5 - 15  Hemoglobin A1c  Result Value Ref Range   Hgb A1c MFr Bld 5.7 (H) 4.8 - 5.6 %   Mean Plasma Glucose 116.89 mg/dL  Lipid panel  Result Value Ref Range   Cholesterol 225 (H) 0 - 200 mg/dL   Triglycerides 63 <150 mg/dL   HDL 69 >40 mg/dL   Total CHOL/HDL Ratio 3.3 RATIO   VLDL 13 0 - 40 mg/dL   LDL Cholesterol 143 (H) 0 - 99 mg/dL  Basic metabolic panel  Result Value Ref Range   Sodium 137 135 - 145 mmol/L   Potassium 3.8 3.5 - 5.1 mmol/L   Chloride 102 98 - 111 mmol/L   CO2 25 22 - 32 mmol/L   Glucose, Bld 112 (H) 70 - 99 mg/dL   BUN 26 (H) 8 - 23 mg/dL   Creatinine, Ser 1.09 (H) 0.44 - 1.00 mg/dL   Calcium 9.0 8.9 - 10.3 mg/dL   GFR, Estimated 49 (L) >60 mL/min   Anion gap 10 5 - 15  CBG monitoring, ED  Result Value Ref Range   Glucose-Capillary 109 (H) 70 - 99 mg/dL  ECHOCARDIOGRAM COMPLETE  Result Value Ref Range   Weight 2,405.66 oz   Height 63 in   BP 147/88 mmHg   Ao pk vel 1.21 m/s   AR max vel 1.24 cm2   AV Peak grad 5.9 mmHg   S' Lateral 3.07 cm   Area-P 1/2 3.99 cm2      Assessment & Plan:   Problem List Items Addressed This Visit      Cardiovascular and Mediastinum   Aphasia S/P CVA - Primary    Has some mild aphasia, speaking pretty fluently. To have speech therapy after the holidays. Has follow up with cardiology. Tolerating her statin well. Referral to local neurologist placed today. Has heart monitor on. Continue plavix and aspirin and statin. Continue to monitor. Call with any concerns. Follow up 3-4 weeks.       Relevant Orders   Ambulatory referral to Neurology       Follow up plan: Return 3-4 weeks.  >30 minutes spent with patient and her daughter today.

## 2020-03-21 ENCOUNTER — Encounter: Payer: Self-pay | Admitting: Family Medicine

## 2020-03-21 DIAGNOSIS — R4701 Aphasia: Secondary | ICD-10-CM | POA: Insufficient documentation

## 2020-03-21 NOTE — Assessment & Plan Note (Signed)
Has some mild aphasia, speaking pretty fluently. To have speech therapy after the holidays. Has follow up with cardiology. Tolerating her statin well. Referral to local neurologist placed today. Has heart monitor on. Continue plavix and aspirin and statin. Continue to monitor. Call with any concerns. Follow up 3-4 weeks.

## 2020-03-25 ENCOUNTER — Other Ambulatory Visit: Payer: Self-pay

## 2020-03-25 ENCOUNTER — Telehealth: Payer: PPO | Admitting: Nurse Practitioner

## 2020-03-25 ENCOUNTER — Emergency Department
Admission: EM | Admit: 2020-03-25 | Discharge: 2020-03-25 | Disposition: A | Discharge status: Home / Self Care | Payer: PPO | Attending: Emergency Medicine | Admitting: Emergency Medicine

## 2020-03-25 ENCOUNTER — Telehealth: Payer: Self-pay | Admitting: General Practice

## 2020-03-25 ENCOUNTER — Ambulatory Visit: Payer: Self-pay

## 2020-03-25 ENCOUNTER — Telehealth: Payer: PPO

## 2020-03-25 ENCOUNTER — Ambulatory Visit: Admit: 2020-03-25 | Disposition: A | Discharge status: Home / Self Care | Payer: Self-pay

## 2020-03-25 DIAGNOSIS — N183 Chronic kidney disease, stage 3 unspecified: Secondary | ICD-10-CM | POA: Insufficient documentation

## 2020-03-25 DIAGNOSIS — K5641 Fecal impaction: Secondary | ICD-10-CM | POA: Insufficient documentation

## 2020-03-25 DIAGNOSIS — Z7901 Long term (current) use of anticoagulants: Secondary | ICD-10-CM | POA: Diagnosis not present

## 2020-03-25 DIAGNOSIS — Z85828 Personal history of other malignant neoplasm of skin: Secondary | ICD-10-CM | POA: Diagnosis not present

## 2020-03-25 DIAGNOSIS — I129 Hypertensive chronic kidney disease with stage 1 through stage 4 chronic kidney disease, or unspecified chronic kidney disease: Secondary | ICD-10-CM | POA: Diagnosis not present

## 2020-03-25 DIAGNOSIS — Z7982 Long term (current) use of aspirin: Secondary | ICD-10-CM | POA: Insufficient documentation

## 2020-03-25 DIAGNOSIS — E039 Hypothyroidism, unspecified: Secondary | ICD-10-CM | POA: Insufficient documentation

## 2020-03-25 DIAGNOSIS — Z79899 Other long term (current) drug therapy: Secondary | ICD-10-CM | POA: Diagnosis not present

## 2020-03-25 DIAGNOSIS — Z8673 Personal history of transient ischemic attack (TIA), and cerebral infarction without residual deficits: Secondary | ICD-10-CM | POA: Diagnosis not present

## 2020-03-25 DIAGNOSIS — K6289 Other specified diseases of anus and rectum: Secondary | ICD-10-CM | POA: Diagnosis present

## 2020-03-25 LAB — CBC
HCT: 33.4 % — ABNORMAL LOW (ref 36.0–46.0)
Hemoglobin: 11.7 g/dL — ABNORMAL LOW (ref 12.0–15.0)
MCH: 30.5 pg (ref 26.0–34.0)
MCHC: 35 g/dL (ref 30.0–36.0)
MCV: 87.2 fL (ref 80.0–100.0)
Platelets: 282 10*3/uL (ref 150–400)
RBC: 3.83 MIL/uL — ABNORMAL LOW (ref 3.87–5.11)
RDW: 13.4 % (ref 11.5–15.5)
WBC: 6.6 10*3/uL (ref 4.0–10.5)
nRBC: 0 % (ref 0.0–0.2)

## 2020-03-25 LAB — LIPASE, BLOOD: Lipase: 29 U/L (ref 11–51)

## 2020-03-25 LAB — COMPREHENSIVE METABOLIC PANEL
ALT: 15 U/L (ref 0–44)
AST: 18 U/L (ref 15–41)
Albumin: 4 g/dL (ref 3.5–5.0)
Alkaline Phosphatase: 47 U/L (ref 38–126)
Anion gap: 11 (ref 5–15)
BUN: 16 mg/dL (ref 8–23)
CO2: 23 mmol/L (ref 22–32)
Calcium: 9.6 mg/dL (ref 8.9–10.3)
Chloride: 101 mmol/L (ref 98–111)
Creatinine, Ser: 0.85 mg/dL (ref 0.44–1.00)
GFR, Estimated: >60 mL/min (ref >60)
Glucose, Bld: 116 mg/dL — ABNORMAL HIGH (ref 70–99)
Potassium: 3.8 mmol/L (ref 3.5–5.1)
Sodium: 135 mmol/L (ref 135–145)
Total Bilirubin: 1 mg/dL (ref 0.3–1.2)
Total Protein: 7.2 g/dL (ref 6.5–8.1)

## 2020-03-25 NOTE — Telephone Encounter (Signed)
Spoke with Pt's daughter per DPR scheduled for 03/26/2020, Pt's daughter stated they would try to go to Urgent care today as she felt mother could not wait for apt tomorrow. Pt's daughter stated she would call to cancel apt in the am if they did go to ER or ED.

## 2020-03-25 NOTE — ED Triage Notes (Signed)
Pt comes pov with lower (suprapubic) abd pain with dx of proctitis. Urgent care sent pt here for CT.

## 2020-03-25 NOTE — Telephone Encounter (Signed)
Patients daughter called on DPR  Stating that her mother is having rectal pain and pressure.  She states that it started about one week ago.  She states that it just comes on and she passes mucus and sometimes darker stool. No blood. Patient has been recently hospitalized with stroke and home on the 15th She states that her last normal BM was last week sometime.  She has lower abdominal pain and some bloating. Severe rectal pain.  She has hx of proctitis in 2014 and feels this may be the same. No fever. No other symptoms. Per protocol patient needs seen today. Call place to office and note will be routed as instructed.  Daughter will wait for response from office.  Reason for Disposition . [1] Rectal pain or fullness from fecal impaction (rectum full of stool) AND [2] NOT better after SITZ bath, suppository or enema  Answer Assessment - Initial Assessment Questions 1. STOOL PATTERN OR FREQUENCY: "How often do you pass bowel movements (BMs)?"  (Normal range: tid to q 3 days)  "When was the last BM passed?"       Not sure about one week 2. STRAINING: "Do you have to strain to have a BM?"      Straining it forces itself  3. RECTAL PAIN: "Does your rectum hurt when the stool comes out?" If Yes, ask: "Do you have hemorrhoids? How bad is the pain?"  (Scale 1-10; or mild, moderate, severe)    Extreme pain feels pressure even after done 4. STOOL COMPOSITION: "Are the stools hard?"     Liquid just eases out 5. BLOOD ON STOOLS: "Has there been any blood on the toilet tissue or on the surface of the BM?" If Yes, ask: "When was the last time?"      Dark pus and mucus no frank blood 6. CHRONIC CONSTIPATION: "Is this a new problem for you?"  If no, ask: "How long have you had this problem?" (days, weeks, months)      Proctitis 2014 7. CHANGES IN DIET OR HYDRATION: "Have there been any recent changes in your diet?" "How much fluids are you drinking consuming on a daily basis?"  "How much have you had to drink  today?"     none 8. MEDICATIONS: "Have you been taking any new medications?" "Are you taking any narcotic pain medications?" (e.g., Vicoden, Percocet, morphine, dilaudid)    none 9. LAXATIVES: "Have you been using any stool softeners, laxatives, or enemas?"  If yes, ask "What, how often, and when was the last time?" 10.ACTIVITY:  "How much walking do you do every day? on a daily basis?"  "Has your activity level decreased in the past week?"       Afternoon naps but normal 11. CAUSE: "What do you think is causing the constipation?"        Unsure proctitis 12. OTHER SYMPTOMS: "Do you have any other symptoms?" (e.g., abdominal pain, bloating, fever, vomiting)       Aches lower abdomin  13. MEDICAL HISTORY: "Do you have a history of hemorrhoids, rectal fissures, or rectal surgery or rectal abscess?"        Treatment for hemorrhoids 14. PREGNANCY: "Is there any chance you are pregnant?" "When was your last menstrual period?"       N/A  Protocols used: CONSTIPATION-A-AH

## 2020-03-25 NOTE — Telephone Encounter (Signed)
Unfortunately  not for today we do for tomorrow.

## 2020-03-25 NOTE — Telephone Encounter (Signed)
Can we get her on the schedule for tomorrow and if she feels like she can't wait she will need to go to the ER or urgent care

## 2020-03-25 NOTE — Telephone Encounter (Signed)
  Chronic Care Management   Outreach Note  03/25/2020 Name: Cassie Brewer MRN: [id number] DOB: Feb 16, 1929  Referred by: Dorcas Carrow, DO Reason for referral : Appointment (RNCM: Follow up-2nd attempt for Chronic Disease Management and Care Coordination Needs)   Cassie Brewer is enrolled in a Managed Medicaid Health Plan: No  A second unsuccessful telephone outreach was attempted today. The patient was referred to the case management team for assistance with care management and care coordination. Used work cell and also used Occupational hygienist. Unable to leave a message. No answer.   Follow Up Plan: The care management team will reach out to the patient again over the next 30 to 60 days.   Alto Denver RN, MSN, CCM Community Care Coordinator [place]  [facility] Mobile: [phone]

## 2020-03-25 NOTE — ED Provider Notes (Signed)
[facility] Emergency Department Provider Note   ____________________________________________    I have reviewed the triage vital signs and the nursing notes.   HISTORY  Chief Complaint Abdominal Pain     HPI Cassie Brewer is a 86 y.o. female who presents with complaints of rectal pain.  Patient reports that she thinks that she may have an impaction because she has not been able to have a proper bowel movement and has been having "squirts "that seem to be moving around a larger stool.  She thinks this is related to proctitis.  Denies nausea or vomiting.  No fevers or chills.  Does not take anything for this except for a probiotic.  Reports has had this in the past and apparently got better with antibiotics  Past Medical History:  Diagnosis Date  . Glaucoma   . Hyperlipidemia   . Hypertension   . Hypothyroidism 10/02/2014  . Overactive bladder   . Squamous cell skin cancer, face     Patient Active Problem List   Diagnosis Date Noted  . Aphasia 03/21/2020  . Aphasia S/P CVA 03/11/2020  . TIA (transient ischemic attack) 03/10/2020  . 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 Arthropathy 04/24/2015  . Benign hypertensive renal disease 10/02/2014  . Hypothyroidism 10/02/2014  . Hyperlipidemia 10/02/2014  . CKD (chronic kidney disease), stage III (HCC) 10/02/2014    Past Surgical History:  Procedure Laterality Date  . ABDOMINAL HYSTERECTOMY    . BREAST SURGERY    . THYROIDECTOMY, PARTIAL Right     Prior to Admission medications   Medication Sig Start Date End Date Taking? Authorizing Provider  aspirin EC 81 MG EC tablet Take 1 tablet (81 mg total) by mouth daily. Swallow whole. 03/13/20   Lanae Boast, MD  atorvastatin (LIPITOR) 80 MG tablet Take 1 tablet (80 mg total) by mouth daily. 03/13/20 05/12/20  Lanae Boast, MD  benazepril (LOTENSIN) 40 MG tablet Take 1 tablet (40 mg total) by mouth daily. Patient taking differently: Take 40 mg by mouth every  evening. 11/29/19   Johnson, Megan P, DO  Calcium Carbonate-Vit D-Min (CALCIUM 600+D PLUS MINERALS) 600-400 MG-UNIT TABS Take 1 tablet by mouth as directed.    [provider]  clopidogrel (PLAVIX) 75 MG tablet Take 1 tablet (75 mg total) by mouth daily for 21 days. 03/13/20 04/03/20  Lanae Boast, MD  famotidine (PEPCID) 40 MG tablet TAKE 1 TABLET BY MOUTH EVERYDAY AT BEDTIME Patient taking differently: Take 40 mg by mouth at bedtime as needed for heartburn. 02/06/20   Wyline Mood, MD  fluticasone [facility]) 50 MCG/ACT nasal spray Place 2 sprays into both nostrils daily as needed for allergies. Patient not taking: Reported on 03/20/2020    [provider]  hydrochlorothiazide (HYDRODIURIL) 25 MG tablet Take 1 tablet (25 mg total) by mouth daily. 11/29/19   Johnson, Megan P, DO  hydrocortisone (ANUSOL-HC) 2.5 % rectal cream Place 1 application rectally 2 (two) times daily. 03/20/20   Johnson, Megan P, DO  latanoprost (XALATAN) 0.005 % ophthalmic solution Place 1 drop into both eyes at bedtime.    [provider]  levothyroxine (SYNTHROID) 112 MCG tablet Take 1 tablet (112 mcg total) by mouth daily before breakfast. 12/04/19   Johnson, Megan P, DO  Multiple Vitamins-Minerals (CENTRUM SILVER ULTRA WOMENS PO) Take 1 tablet by mouth daily.    [provider]  Omega-3 Fatty Acids (FISH OIL) 1000 MG CAPS Take 1,000 mg by mouth daily.    [provider]  timolol (TIMOPTIC) 0.5 % ophthalmic solution Place 1 drop into the left eye daily.    [provider]  vitamin C (ASCORBIC ACID) 500 MG tablet Take 500 mg by mouth daily.    [provider]     Allergies Amoxicillin, Eryc [erythromycin], and Sulfa antibiotics  Family History  Problem Relation Age of Onset  . Hypertension Mother   . Diabetes Father   . Colon cancer Father   . Breast cancer Grandchild     Social History Social History   Tobacco Use  . Smoking status: Never Smoker  .  Smokeless tobacco: Never Used  Vaping Use  . Vaping Use: Never used  Substance Use Topics  . Alcohol use: No  . Drug use: No    Review of Systems  Constitutional: No fever/chills Eyes: No visual changes.  ENT: No sore throat. Cardiovascular: Denies chest pain. Respiratory: Denies shortness of breath. Gastrointestinal: No abdominal pain.  No nausea, no vomiting.   Genitourinary: Negative for dysuria. Musculoskeletal: Negative for back pain. Skin: Negative for rash. Neurological: Negative for headaches or weakness   ____________________________________________   PHYSICAL EXAM:  VITAL SIGNS: ED Triage Vitals [03/25/20 1217]  Enc Vitals Group     BP (!) 141/63     Pulse Rate 76     Resp 18     Temp 99.1 F (37.3 C)     Temp Source Oral     SpO2 96 %     Weight 67 kg (147 lb 11.3 oz)     Height 1.6 m (5\' 3" )     Head Circumference      Peak Flow      Pain Score 8     Pain Loc      Pain Edu?      Excl. in GC?     Constitutional: Alert and oriented.  Nose: No congestion/rhinnorhea. Mouth/Throat: Mucous membranes are moist.   Neck:  Painless ROM Cardiovascular: Normal rate, regular rhythm. Grossly normal heart sounds.  Good peripheral circulation. Respiratory: Normal respiratory effort.  No retractions. Lungs CTAB. Gastrointestinal: Soft and nontender. No distention.   Musculoskeletal: No lower extremity tenderness nor edema.  Warm and well perfused Neurologic:  Normal speech and language. No gross focal neurologic deficits are appreciated.  Skin:  Skin is warm, dry and intact. No rash noted. Psychiatric: Mood and affect are normal. Speech and behavior are normal.  ____________________________________________   LABS (all labs ordered are listed, but only abnormal results are displayed)  Labs Reviewed  COMPREHENSIVE METABOLIC PANEL - Abnormal; Notable for the following components:      Result Value   Glucose, Bld 116 (*)    All other components within  normal limits  CBC - Abnormal; Notable for the following components:   RBC 3.83 (*)    Hemoglobin 11.7 (*)    HCT 33.4 (*)    All other components within normal limits  LIPASE, BLOOD  URINALYSIS, COMPLETE (UACMP) WITH MICROSCOPIC   ____________________________________________  EKG  None ____________________________________________  RADIOLOGY  None ____________________________________________   PROCEDURES  Procedure(s) performed: yes  ------------------------------------------------------------------------------------------------------------------- Fecal Disimpaction Procedure Note:  Performed by me:  Patient placed in the lateral recumbent position with knees drawn towards chest. Nurse present for patient support. Large amount of hard brown stool removed. No complications during procedure.   ------------------------------------------------------------------------------------------------------------------   Procedures   Critical Care performed: No ____________________________________________   INITIAL IMPRESSION / ASSESSMENT AND PLAN / ED COURSE  Pertinent labs & imaging  results that were available during my care of the patient were reviewed by me and considered in my medical decision making (see chart for details).  Patient presents with symptoms as described above, most consistent with fecal impaction/constipation  Lab work is quite reassuring, and patient is comfortable, has no abdominal tenderness palpation  Fecal disimpaction performed by me with successful removal of hard brown stool  Patient had bowel movement afterwards and is feeling much better, will start her on laxatives, outpatient follow-up as needed    ____________________________________________   FINAL CLINICAL IMPRESSION(S) / ED DIAGNOSES  Final diagnoses:  Fecal impaction in rectum [facility])        Note:  This document was prepared using Dragon voice recognition software and may  include unintentional dictation errors.   Lavonia Drafts, MD 03/25/20 1556

## 2020-03-25 NOTE — Telephone Encounter (Signed)
Does anyone have anything?

## 2020-03-26 ENCOUNTER — Ambulatory Visit: Payer: Self-pay | Admitting: *Deleted

## 2020-03-26 ENCOUNTER — Ambulatory Visit: Payer: PPO | Admitting: Family Medicine

## 2020-03-26 MED ORDER — POLYETHYLENE GLYCOL 3350 17 GM/SCOOP PO POWD: 17.0000 g | Freq: Two times a day (BID) | ORAL | 1 refills | Status: DC | PRN

## 2020-03-26 NOTE — Telephone Encounter (Signed)
She can use mag citrate over the counter, but it's not as gentle. I'll send in some miralax as a Rx to see if they can get it that way

## 2020-03-26 NOTE — Telephone Encounter (Signed)
Patient was disimpacted yesterday in Er for constipation. She was disimpacted and sent home to get stool softener and miralax. She is now having stool pass but still feel like she still not emptying. She has a sense of pressure  Patient advised that she may have that feeling for a while since she was disimpacted.  She is passing no blood. Daughter was advised to ask pharmacist for substitute for miralax.  She states she was unable to find any in store.  Daughter and patient are requesting any suggestions from Dr Laural Benes. Will route note to office.   Reason for Disposition . [1] Caller requesting NON-URGENT health information AND [2] PCP's office is the best resource  Answer Assessment - Initial Assessment Questions 1. REASON FOR CALL or QUESTION: "What is your reason for calling today?" or "How can I best help you?" or "What question do you have that I can help answer?"     Patient was disimpacted yesterday in Er for constipation. She is now having stool pass but still feel like she still not emptying.  What can she take for this  Protocols used: INFORMATION ONLY CALL - NO TRIAGE-A-AH

## 2020-04-01 ENCOUNTER — Ambulatory Visit: Payer: PPO | Admitting: Speech Pathology

## 2020-04-03 ENCOUNTER — Encounter: Payer: PPO | Admitting: Speech Pathology

## 2020-04-03 NOTE — Telephone Encounter (Signed)
Patient has been rescheduled.

## 2020-04-07 ENCOUNTER — Ambulatory Visit: Payer: PPO | Attending: Family Medicine | Admitting: Speech Pathology

## 2020-04-07 ENCOUNTER — Encounter: Payer: PPO | Admitting: Speech Pathology

## 2020-04-07 ENCOUNTER — Other Ambulatory Visit: Payer: Self-pay

## 2020-04-07 DIAGNOSIS — R4701 Aphasia: Secondary | ICD-10-CM | POA: Diagnosis not present

## 2020-04-08 NOTE — Therapy (Signed)
[place] MAIN [facility] SERVICES [address] [place], [place], [address] Phone: [phone]   Fax:  [phone]  Speech Language Pathology Evaluation  Patient Details  Name: Cassie Brewer MRN: [id number] Date of Birth: June 26, 1928 Referring Provider (SLP): Park Liter   Encounter Date: 04/07/2020   End of Session - 04/08/20 1521    Visit Number 1    Number of Visits 1    Authorization Type [employer]    SLP Start Time 1300    SLP Stop Time  1400    SLP Time Calculation (min) 60 min    Activity Tolerance Patient tolerated treatment well           Past Medical History:  Diagnosis Date  . Glaucoma   . Hyperlipidemia   . Hypertension   . Hypothyroidism 10/02/2014  . Overactive bladder   . Squamous cell skin cancer, face     Past Surgical History:  Procedure Laterality Date  . ABDOMINAL HYSTERECTOMY    . BREAST SURGERY    . THYROIDECTOMY, PARTIAL Right     There were no vitals filed for this visit.   Subjective Assessment - 04/08/20 1515    Subjective pt pleasant, conversant, much improved expressive language abilities    Currently in Pain? No/denies              SLP Evaluation [facility] - 04/08/20 1515      SLP Visit Information   SLP Received On 04/08/20    Referring Provider (SLP) Park Liter    Onset Date 03/11/2021    Medical Diagnosis CVA      Subjective   Patient/Family Stated Goal improve speech fluency      General Information   HPI Pt is 87 year old female who presented to the hospital with word finding difficulty on 03/10/2020. Imaging revealed small acute cortical infarct within the posterior left frontal  lobe. SLE performed while inpatient that demonstrated progress during acute hospitalization but moderate word finding deficits. Recommended Outpatient ST services.    Behavioral/Cognition alert, oriented x 4, good historian    Mobility Status ambulatory      Balance Screen   Has the patient  fallen in the past 6 months No    Has the patient had a decrease in activity level because of a fear of falling?  No    Is the patient reluctant to leave their home because of a fear of falling?  No      Prior Functional Status   Cognitive/Linguistic Baseline Within functional limits    Type of Home House     Lives With Alone      Cognition   Overall Cognitive Status Within Functional Limits for tasks assessed      Auditory Comprehension   Overall Auditory Comprehension Appears within functional limits for tasks assessed      Reading Comprehension   Reading Status Within funtional limits      Expression   Primary Mode of Expression Verbal      Verbal Expression   Overall Verbal Expression Appears within functional limits for tasks assessed      Written Expression   Dominant Hand Right    Written Expression Within Functional Limits      Oral Motor/Sensory Function   Overall Oral Motor/Sensory Function Appears within functional limits for tasks assessed      Motor Speech   Overall Motor Speech Appears within functional limits for tasks assessed  SLP Education - 04/08/20 1519    Education Details normalacy of intermittent dysfluencies as dysfluencies don't prevent ability to communication    Person(s) Educated Patient    Methods Explanation    Comprehension Verbalized understanding                Plan - 04/08/20 1522    Clinical Impression Statement Pt's no longer desmonstrates s/s of expressive aphasai. Even when speaking of complex ideas, pt's speech was free of any word finding errors. Education completed, Outpatient skilled ST is not longer indicated.    Consulted and Agree with Plan of Care Patient            Problem List Patient Active Problem List   Diagnosis Date Noted  . Aphasia 03/21/2020  . Aphasia S/P CVA 03/11/2020  . TIA (transient ischemic attack) 03/10/2020  . 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  Arthropathy 04/24/2015  . Benign hypertensive renal disease 10/02/2014  . Hypothyroidism 10/02/2014  . Hyperlipidemia 10/02/2014  . CKD (chronic kidney disease), stage III ([place]) 10/02/2014   Chery Giusto B. Rutherford Nail M.S., CCC-SLP, [place] Pathologist Rehabilitation Services Office [phone]  Stormy Fabian 04/08/2020, 3:23 PM  [place] MAIN [facility] SERVICES [address] [place], [place], [address] Phone: [phone]   Fax:  [phone]  Name: Cassie Brewer MRN: [id number] Date of Birth: 16-Aug-1928

## 2020-04-09 ENCOUNTER — Ambulatory Visit: Payer: PPO | Admitting: Speech Pathology

## 2020-04-10 ENCOUNTER — Encounter: Payer: Self-pay | Admitting: Family Medicine

## 2020-04-10 ENCOUNTER — Telehealth (INDEPENDENT_AMBULATORY_CARE_PROVIDER_SITE_OTHER): Payer: PPO | Admitting: Family Medicine

## 2020-04-10 VITALS — BP 143/73 | HR 71

## 2020-04-10 DIAGNOSIS — I69320 Aphasia following cerebral infarction: Secondary | ICD-10-CM

## 2020-04-10 DIAGNOSIS — R002 Palpitations: Secondary | ICD-10-CM | POA: Diagnosis not present

## 2020-04-10 DIAGNOSIS — E785 Hyperlipidemia, unspecified: Secondary | ICD-10-CM

## 2020-04-10 MED ORDER — POLYETHYLENE GLYCOL 3350 17 GM/SCOOP PO POWD: 17.0000 g | Freq: Two times a day (BID) | ORAL | 1 refills | Status: DC | PRN

## 2020-04-10 MED ORDER — ATORVASTATIN CALCIUM 80 MG PO TABS: 80.0000 mg | ORAL_TABLET | Freq: Every day | ORAL | 1 refills | Status: DC

## 2020-04-10 NOTE — Progress Notes (Signed)
BP (!) 143/73   Pulse 71   LMP 05/13/1980    Subjective:    Patient ID: Cassie Brewer, female    DOB: April 10, 1928, 87 y.o.   MRN: [id number]  HPI: Cassie Brewer is a 87 y.o. female  Chief Complaint  Patient presents with  . Follow-up    3 to 4 week follow up   Continues to feel better. Constipation is better. She has not got impacted or been constipated again. Her speech therapist dismissed her because she was doing so well.   HYPERLIPIDEMIA Hyperlipidemia status: excellent compliance Satisfied with current treatment?  yes Side effects:  no Medication compliance: excellent compliance Past cholesterol meds: atorvastatin Supplements: none Aspirin:  yes The ASCVD Risk score Mikey Bussing DC Jr., et al., 2013) failed to calculate for the following reasons:   The 2013 ASCVD risk score is only valid for ages 34 to 22   The patient has a prior MI or stroke diagnosis Chest pain:  no  Relevant past medical, surgical, family and social history reviewed and updated as indicated. Interim medical history since our last visit reviewed. Allergies and medications reviewed and updated.  Review of Systems  Constitutional: Negative.   Respiratory: Negative.   Cardiovascular: Negative.   Gastrointestinal: Negative.   Musculoskeletal: Negative.   Neurological: Negative.   Psychiatric/Behavioral: Negative.     Per HPI unless specifically indicated above     Objective:    BP (!) 143/73   Pulse 71   LMP 05/13/1980   Wt Readings from Last 3 Encounters:  03/25/20 147 lb 11.3 oz (67 kg)  03/20/20 148 lb (67.1 kg)  03/10/20 (P) 147 lb 8 oz (66.9 kg)    Physical Exam Vitals and nursing note reviewed.  Constitutional:      General: She is not in acute distress.    Appearance: Normal appearance. She is not ill-appearing, toxic-appearing or diaphoretic.  HENT:     Head: Normocephalic and atraumatic.     Right Ear: External ear normal.     Left Ear: External ear normal.     Nose: Nose  normal.     Mouth/Throat:     Mouth: Mucous membranes are moist.     Pharynx: Oropharynx is clear.  Eyes:     General: No scleral icterus.       Right eye: No discharge.        Left eye: No discharge.     Conjunctiva/sclera: Conjunctivae normal.     Pupils: Pupils are equal, round, and reactive to light.  Pulmonary:     Effort: Pulmonary effort is normal. No respiratory distress.     Comments: Speaking in full sentences Musculoskeletal:        General: Normal range of motion.     Cervical back: Normal range of motion.  Skin:    Coloration: Skin is not jaundiced or pale.     Findings: No bruising, erythema, lesion or rash.  Neurological:     Mental Status: She is alert and oriented to person, place, and time. Mental status is at baseline.     Comments: Speech pretty fluent  Psychiatric:        Mood and Affect: Mood normal.        Behavior: Behavior normal.        Thought Content: Thought content normal.        Judgment: Judgment normal.     Results for orders placed or performed in visit on 04/10/20  CBC with Differential/Platelet  Result Value Ref Range   WBC 6.6 3.4 - 10.8 x10E3/uL   RBC 3.93 3.77 - 5.28 x10E6/uL   Hemoglobin 12.1 11.1 - 15.9 g/dL   Hematocrit 34.1 34.0 - 46.6 %   MCV 87 79 - 97 fL   MCH 30.8 26.6 - 33.0 pg   MCHC 35.5 31.5 - 35.7 g/dL   RDW 12.6 11.7 - 15.4 %   Platelets 333 150 - 450 x10E3/uL   Neutrophils 65 Not Estab. %   Lymphs 23 Not Estab. %   Monocytes 8 Not Estab. %   Eos 2 Not Estab. %   Basos 1 Not Estab. %   Neutrophils Absolute 4.3 1.4 - 7.0 x10E3/uL   Lymphocytes Absolute 1.5 0.7 - 3.1 x10E3/uL   Monocytes Absolute 0.5 0.1 - 0.9 x10E3/uL   EOS (ABSOLUTE) 0.1 0.0 - 0.4 x10E3/uL   Basophils Absolute 0.1 0.0 - 0.2 x10E3/uL   Immature Granulocytes 1 Not Estab. %   Immature Grans (Abs) 0.0 0.0 - 0.1 x10E3/uL  Comprehensive metabolic panel  Result Value Ref Range   Glucose 103 (H) 65 - 99 mg/dL   BUN 18 8 - 27 mg/dL   Creatinine, Ser  1.02 (H) 0.57 - 1.00 mg/dL   GFR calc non Af Amer 49 (L) >59 mL/min/1.73   GFR calc Af Amer 56 (L) >59 mL/min/1.73   BUN/Creatinine Ratio 18 12 - 28   Sodium 135 134 - 144 mmol/L   Potassium 4.0 3.5 - 5.2 mmol/L   Chloride 97 96 - 106 mmol/L   CO2 26 20 - 29 mmol/L   Calcium 9.6 8.7 - 10.3 mg/dL   Total Protein 7.3 6.0 - 8.5 g/dL   Albumin 4.4 3.6 - 4.6 g/dL   Globulin, Total 2.9 1.5 - 4.5 g/dL   Albumin/Globulin Ratio 1.5 1.2 - 2.2   Bilirubin Total 0.4 0.0 - 1.2 mg/dL   Alkaline Phosphatase 60 44 - 121 IU/L   AST 20 0 - 40 IU/L   ALT 14 0 - 32 IU/L  Lipid Panel w/o Chol/HDL Ratio  Result Value Ref Range   Cholesterol, Total 165 100 - 199 mg/dL   Triglycerides 67 0 - 149 mg/dL   HDL 74 >39 mg/dL   VLDL Cholesterol Cal 13 5 - 40 mg/dL   LDL Chol Calc (NIH) 78 0 - 99 mg/dL      Assessment & Plan:   Problem List Items Addressed This Visit      Cardiovascular and Mediastinum   Aphasia S/P CVA    Doing great! Speech fluency continues to improve. Has been d/c'd from speech therapy. Has an appointment with neurology coming up. Continue to monitor.       Relevant Medications   atorvastatin (LIPITOR) 80 MG tablet   Other Relevant Orders   CBC with Differential/Platelet (Completed)     Other   Hyperlipidemia - Primary    Tolerating her medicine well. Will check labs. Await results. Call with any concerns.      Relevant Medications   atorvastatin (LIPITOR) 80 MG tablet   Other Relevant Orders   Lipid Panel w/o Chol/HDL Ratio (Completed)   Comprehensive metabolic panel (Completed)       Follow up plan: Return in about 6 months (around 10/08/2020).   . This visit was completed via MyChart due to the restrictions of the COVID-19 pandemic. All issues as above were discussed and addressed. Physical exam was done as above through visual confirmation on MyChart. If it was felt that  the patient should be evaluated in the office, they were directed there. The patient verbally  consented to this visit. . Location of the patient: home . Location of the provider: home . Those involved with this call:  . Provider: Park Liter, DO . CMA: Gwenlyn Perking, CMA . Front Desk/Registration: Jill Side  . Time spent on call: 25 minutes with patient face to face via video conference. More than 50% of this time was spent in counseling and coordination of care. 40 minutes total spent in review of patient's record and preparation of their chart.

## 2020-04-11 ENCOUNTER — Other Ambulatory Visit: Payer: PPO

## 2020-04-11 ENCOUNTER — Other Ambulatory Visit: Payer: Self-pay

## 2020-04-11 DIAGNOSIS — E785 Hyperlipidemia, unspecified: Secondary | ICD-10-CM | POA: Diagnosis not present

## 2020-04-11 DIAGNOSIS — R0600 Dyspnea, unspecified: Secondary | ICD-10-CM | POA: Diagnosis not present

## 2020-04-11 DIAGNOSIS — I69320 Aphasia following cerebral infarction: Secondary | ICD-10-CM | POA: Diagnosis not present

## 2020-04-11 DIAGNOSIS — I10 Essential (primary) hypertension: Secondary | ICD-10-CM | POA: Diagnosis not present

## 2020-04-11 DIAGNOSIS — N1831 Chronic kidney disease, stage 3a: Secondary | ICD-10-CM | POA: Diagnosis not present

## 2020-04-11 DIAGNOSIS — I499 Cardiac arrhythmia, unspecified: Secondary | ICD-10-CM | POA: Diagnosis not present

## 2020-04-11 DIAGNOSIS — E782 Mixed hyperlipidemia: Secondary | ICD-10-CM | POA: Diagnosis not present

## 2020-04-11 DIAGNOSIS — Z8639 Personal history of other endocrine, nutritional and metabolic disease: Secondary | ICD-10-CM | POA: Diagnosis not present

## 2020-04-11 DIAGNOSIS — I779 Disorder of arteries and arterioles, unspecified: Secondary | ICD-10-CM | POA: Diagnosis not present

## 2020-04-11 DIAGNOSIS — I639 Cerebral infarction, unspecified: Secondary | ICD-10-CM | POA: Diagnosis not present

## 2020-04-12 LAB — COMPREHENSIVE METABOLIC PANEL
ALT: 14 IU/L (ref 0–32)
AST: 20 IU/L (ref 0–40)
Albumin/Globulin Ratio: 1.5 (ref 1.2–2.2)
Albumin: 4.4 g/dL (ref 3.6–4.6)
Alkaline Phosphatase: 60 IU/L (ref 44–121)
BUN/Creatinine Ratio: 18 (ref 12–28)
BUN: 18 mg/dL (ref 8–27)
Bilirubin Total: 0.4 mg/dL (ref 0.0–1.2)
CO2: 26 mmol/L (ref 20–29)
Calcium: 9.6 mg/dL (ref 8.7–10.3)
Chloride: 97 mmol/L (ref 96–106)
Creatinine, Ser: 1.02 mg/dL — ABNORMAL HIGH (ref 0.57–1.00)
GFR calc Af Amer: 56 mL/min/{1.73_m2} — ABNORMAL LOW (ref >59)
GFR calc non Af Amer: 49 mL/min/{1.73_m2} — ABNORMAL LOW (ref >59)
Globulin, Total: 2.9 g/dL (ref 1.5–4.5)
Glucose: 103 mg/dL — ABNORMAL HIGH (ref 65–99)
Potassium: 4 mmol/L (ref 3.5–5.2)
Sodium: 135 mmol/L (ref 134–144)
Total Protein: 7.3 g/dL (ref 6.0–8.5)

## 2020-04-12 LAB — CBC WITH DIFFERENTIAL/PLATELET
Basophils Absolute: 0.1 10*3/uL (ref 0.0–0.2)
Basos: 1 %
EOS (ABSOLUTE): 0.1 10*3/uL (ref 0.0–0.4)
Eos: 2 %
Hematocrit: 34.1 % (ref 34.0–46.6)
Hemoglobin: 12.1 g/dL (ref 11.1–15.9)
Immature Grans (Abs): 0 10*3/uL (ref 0.0–0.1)
Immature Granulocytes: 1 %
Lymphocytes Absolute: 1.5 10*3/uL (ref 0.7–3.1)
Lymphs: 23 %
MCH: 30.8 pg (ref 26.6–33.0)
MCHC: 35.5 g/dL (ref 31.5–35.7)
MCV: 87 fL (ref 79–97)
Monocytes Absolute: 0.5 10*3/uL (ref 0.1–0.9)
Monocytes: 8 %
Neutrophils Absolute: 4.3 10*3/uL (ref 1.4–7.0)
Neutrophils: 65 %
Platelets: 333 10*3/uL (ref 150–450)
RBC: 3.93 x10E6/uL (ref 3.77–5.28)
RDW: 12.6 % (ref 11.7–15.4)
WBC: 6.6 10*3/uL (ref 3.4–10.8)

## 2020-04-12 LAB — LIPID PANEL W/O CHOL/HDL RATIO
Cholesterol, Total: 165 mg/dL (ref 100–199)
HDL: 74 mg/dL (ref >39)
LDL Chol Calc (NIH): 78 mg/dL (ref 0–99)
Triglycerides: 67 mg/dL (ref 0–149)
VLDL Cholesterol Cal: 13 mg/dL (ref 5–40)

## 2020-04-13 ENCOUNTER — Encounter: Payer: Self-pay | Admitting: Family Medicine

## 2020-04-13 NOTE — Assessment & Plan Note (Signed)
Tolerating her medicine well. Will check labs. Await results. Call with any concerns.

## 2020-04-13 NOTE — Assessment & Plan Note (Signed)
Doing great! Speech fluency continues to improve. Has been d/c'd from speech therapy. Has an appointment with neurology coming up. Continue to monitor.

## 2020-04-14 ENCOUNTER — Encounter: Payer: PPO | Admitting: Speech Pathology

## 2020-04-16 ENCOUNTER — Encounter: Payer: PPO | Admitting: Speech Pathology

## 2020-04-21 ENCOUNTER — Encounter: Payer: PPO | Admitting: Speech Pathology

## 2020-04-23 ENCOUNTER — Encounter: Payer: PPO | Admitting: Speech Pathology

## 2020-04-28 ENCOUNTER — Ambulatory Visit (INDEPENDENT_AMBULATORY_CARE_PROVIDER_SITE_OTHER): Payer: PPO

## 2020-04-28 ENCOUNTER — Encounter: Payer: PPO | Admitting: Speech Pathology

## 2020-04-28 VITALS — Ht 64.0 in | Wt 144.0 lb

## 2020-04-28 DIAGNOSIS — Z0000 Encounter for general adult medical examination without abnormal findings: Secondary | ICD-10-CM

## 2020-04-28 NOTE — Patient Instructions (Signed)
[name] , Thank you for taking time to come for your Medicare Wellness Visit. I appreciate your ongoing commitment to your health goals. Please review the following plan we discussed and let me know if I can assist you in the future.   Screening recommendations/referrals: Colonoscopy: not required Mammogram: not required Bone Density: completed 10/02/2013 Recommended yearly ophthalmology/optometry visit for glaucoma screening and checkup Recommended yearly dental visit for hygiene and checkup  Vaccinations: Influenza vaccine: completed 11/29/2019, due 10/27/2020 Pneumococcal vaccine: completed 10/01/2013 Tdap vaccine: completed 11/10/2016, due 11/11/2026 Shingles vaccine: discussed   Covid-19: 03/18/2020, 05/11/2019, 04/20/2019  Advanced directives: Please bring a copy of your POA (Power of Attorney) and/or Living Will to your next appointment.   Conditions/risks identified: none  Next appointment: Follow up in one year for your annual wellness visit    Preventive Care 65 Years and Older, Female Preventive care refers to lifestyle choices and visits with your health care provider that can promote health and wellness. What does preventive care include?  A yearly physical exam. This is also called an annual well check.  Dental exams once or twice a year.  Routine eye exams. Ask your health care provider how often you should have your eyes checked.  Personal lifestyle choices, including:  Daily care of your teeth and gums.  Regular physical activity.  Eating a healthy diet.  Avoiding tobacco and drug use.  Limiting alcohol use.  Practicing safe sex.  Taking low-dose aspirin every day.  Taking vitamin and mineral supplements as recommended by your health care provider. What happens during an annual well check? The services and screenings done by your health care provider during your annual well check will depend on your age, overall health, lifestyle risk factors, and family  history of disease. Counseling  Your health care provider may ask you questions about your:  Alcohol use.  Tobacco use.  Drug use.  Emotional well-being.  Home and relationship well-being.  Sexual activity.  Eating habits.  History of falls.  Memory and ability to understand (cognition).  Work and work Statistician.  Reproductive health. Screening  You may have the following tests or measurements:  Height, weight, and BMI.  Blood pressure.  Lipid and cholesterol levels. These may be checked every 5 years, or more frequently if you are over 69 years old.  Skin check.  Lung cancer screening. You may have this screening every year starting at age 7 if you have a 30-pack-year history of smoking and currently smoke or have quit within the past 15 years.  Fecal occult blood test (FOBT) of the stool. You may have this test every year starting at age 70.  Flexible sigmoidoscopy or colonoscopy. You may have a sigmoidoscopy every 5 years or a colonoscopy every 10 years starting at age 52.  Hepatitis C blood test.  Hepatitis B blood test.  Sexually transmitted disease (STD) testing.  Diabetes screening. This is done by checking your blood sugar (glucose) after you have not eaten for a while (fasting). You may have this done every 1-3 years.  Bone density scan. This is done to screen for osteoporosis. You may have this done starting at age 75.  Mammogram. This may be done every 1-2 years. Talk to your health care provider about how often you should have regular mammograms. Talk with your health care provider about your test results, treatment options, and if necessary, the need for more tests. Vaccines  Your health care provider may recommend certain vaccines, such as:  Influenza  vaccine. This is recommended every year.  Tetanus, diphtheria, and acellular pertussis (Tdap, Td) vaccine. You may need a Td booster every 10 years.  Zoster vaccine. You may need this after  age 50.  Pneumococcal 13-valent conjugate (PCV13) vaccine. One dose is recommended after age 36.  Pneumococcal polysaccharide (PPSV23) vaccine. One dose is recommended after age 87. Talk to your health care provider about which screenings and vaccines you need and how often you need them. This information is not intended to replace advice given to you by your health care provider. Make sure you discuss any questions you have with your health care provider. Document Released: 04/11/2015 Document Revised: 12/03/2015 Document Reviewed: 01/14/2015 Elsevier Interactive Patient Education  2017 [place] Prevention in the Home Falls can cause injuries. They can happen to people of all ages. There are many things you can do to make your home safe and to help prevent falls. What can I do on the outside of my home?  Regularly fix the edges of walkways and driveways and fix any cracks.  Remove anything that might make you trip as you walk through a door, such as a raised step or threshold.  Trim any bushes or trees on the path to your home.  Use bright outdoor lighting.  Clear any walking paths of anything that might make someone trip, such as rocks or tools.  Regularly check to see if handrails are loose or broken. Make sure that both sides of any steps have handrails.  Any raised decks and porches should have guardrails on the edges.  Have any leaves, snow, or ice cleared regularly.  Use sand or salt on walking paths during winter.  Clean up any spills in your garage right away. This includes oil or grease spills. What can I do in the bathroom?  Use night lights.  Install grab bars by the toilet and in the tub and shower. Do not use towel bars as grab bars.  Use non-skid mats or decals in the tub or shower.  If you need to sit down in the shower, use a plastic, non-slip stool.  Keep the floor dry. Clean up any water that spills on the floor as soon as it  happens.  Remove soap buildup in the tub or shower regularly.  Attach bath mats securely with double-sided non-slip rug tape.  Do not have throw rugs and other things on the floor that can make you trip. What can I do in the bedroom?  Use night lights.  Make sure that you have a light by your bed that is easy to reach.  Do not use any sheets or blankets that are too big for your bed. They should not hang down onto the floor.  Have a firm chair that has side arms. You can use this for support while you get dressed.  Do not have throw rugs and other things on the floor that can make you trip. What can I do in the kitchen?  Clean up any spills right away.  Avoid walking on wet floors.  Keep items that you use a lot in easy-to-reach places.  If you need to reach something above you, use a strong step stool that has a grab bar.  Keep electrical cords out of the way.  Do not use floor polish or wax that makes floors slippery. If you must use wax, use non-skid floor wax.  Do not have throw rugs and other things on the floor that can  make you trip. What can I do with my stairs?  Do not leave any items on the stairs.  Make sure that there are handrails on both sides of the stairs and use them. Fix handrails that are broken or loose. Make sure that handrails are as long as the stairways.  Check any carpeting to make sure that it is firmly attached to the stairs. Fix any carpet that is loose or worn.  Avoid having throw rugs at the top or bottom of the stairs. If you do have throw rugs, attach them to the floor with carpet tape.  Make sure that you have a light switch at the top of the stairs and the bottom of the stairs. If you do not have them, ask someone to add them for you. What else can I do to help prevent falls?  Wear shoes that:  Do not have high heels.  Have rubber bottoms.  Are comfortable and fit you well.  Are closed at the toe. Do not wear sandals.  If you  use a stepladder:  Make sure that it is fully opened. Do not climb a closed stepladder.  Make sure that both sides of the stepladder are locked into place.  Ask someone to hold it for you, if possible.  Clearly mark and make sure that you can see:  Any grab bars or handrails.  First and last steps.  Where the edge of each step is.  Use tools that help you move around (mobility aids) if they are needed. These include:  Canes.  Walkers.  Scooters.  Crutches.  Turn on the lights when you go into a dark area. Replace any light bulbs as soon as they burn out.  Set up your furniture so you have a clear path. Avoid moving your furniture around.  If any of your floors are uneven, fix them.  If there are any pets around you, be aware of where they are.  Review your medicines with your doctor. Some medicines can make you feel dizzy. This can increase your chance of falling. Ask your doctor what other things that you can do to help prevent falls. This information is not intended to replace advice given to you by your health care provider. Make sure you discuss any questions you have with your health care provider. Document Released: 01/09/2009 Document Revised: 08/21/2015 Document Reviewed: 04/19/2014 Elsevier Interactive Patient Education  2017 [employer].

## 2020-04-28 NOTE — Progress Notes (Signed)
I connected with Genia Hotter today by telephone and verified that I am speaking with the correct person using two identifiers. Location patient: home Location provider: work Persons participating in the virtual visit: Merlie Mouse, Glenna Durand LPN.   I discussed the limitations, risks, security and privacy concerns of performing an evaluation and management service by telephone and the availability of in person appointments. I also discussed with the patient that there may be a patient responsible charge related to this service. The patient expressed understanding and verbally consented to this telephonic visit.    Interactive audio and video telecommunications were attempted between this provider and patient, however failed, due to patient having technical difficulties OR patient did not have access to video capability.  We continued and completed visit with audio only.     Vital signs may be patient reported or missing.  Subjective:   Cassie Brewer is a 87 y.o. female who presents for Medicare Annual (Subsequent) preventive examination.  Review of Systems     Cardiac Risk Factors include: advanced age (>69men, >27 women);hypertension;dyslipidemia;sedentary lifestyle     Objective:    Today's Vitals   04/28/20 1111  Weight: 144 lb (65.3 kg)  Height: 5\' 4"  (1.626 m)   Body mass index is 24.72 kg/m.  Advanced Directives 04/28/2020 03/25/2020 03/10/2020 03/10/2020 11/16/2018 11/14/2017 11/03/2016  Does Patient Have a Medical Advance Directive? Yes No No No Yes Yes Yes  Type of Paramedic of [place];Living will - - - Living will;Healthcare Power of [place];Living will [place];Living will  Copy of [place] in Chart? No - copy requested - - - No - copy requested No - copy requested No - copy requested  Would patient like information on creating a medical advance directive? - - - No - Patient  declined - - -    Current Medications (verified) Outpatient Encounter Medications as of 04/28/2020  Medication Sig  . aspirin EC 81 MG EC tablet Take 1 tablet (81 mg total) by mouth daily. Swallow whole.  Marland Kitchen atorvastatin (LIPITOR) 80 MG tablet Take 1 tablet (80 mg total) by mouth daily.  . benazepril (LOTENSIN) 40 MG tablet Take 1 tablet (40 mg total) by mouth daily. (Patient taking differently: Take 40 mg by mouth every evening.)  . Calcium Carbonate-Vit D-Min (CALCIUM 600+D PLUS MINERALS) 600-400 MG-UNIT TABS Take 1 tablet by mouth as directed.  . famotidine (PEPCID) 40 MG tablet TAKE 1 TABLET BY MOUTH EVERYDAY AT BEDTIME (Patient taking differently: Take 40 mg by mouth at bedtime as needed for heartburn.)  . hydrochlorothiazide (HYDRODIURIL) 25 MG tablet Take 1 tablet (25 mg total) by mouth daily.  . hydrocortisone (ANUSOL-HC) 2.5 % rectal cream Place 1 application rectally 2 (two) times daily.  Marland Kitchen latanoprost (XALATAN) 0.005 % ophthalmic solution Place 1 drop into both eyes at bedtime.  Marland Kitchen levothyroxine (SYNTHROID) 112 MCG tablet Take 1 tablet (112 mcg total) by mouth daily before breakfast.  . Multiple Vitamins-Minerals (CENTRUM SILVER ULTRA WOMENS PO) Take 1 tablet by mouth daily.  . Omega-3 Fatty Acids (FISH OIL) 1000 MG CAPS Take 1,000 mg by mouth daily.  . polyethylene glycol powder (GLYCOLAX/MIRALAX) 17 GM/SCOOP powder Take 17 g by mouth 2 (two) times daily as needed.  . timolol (TIMOPTIC) 0.5 % ophthalmic solution Place 1 drop into the left eye daily.  . vitamin C (ASCORBIC ACID) 500 MG tablet Take 500 mg by mouth daily.  . fluticasone (FLONASE) 50 MCG/ACT  nasal spray Place 2 sprays into both nostrils daily as needed for allergies. (Patient not taking: No sig reported)   No facility-administered encounter medications on file as of 04/28/2020.    Allergies (verified) Amoxicillin, Eryc [erythromycin], and Sulfa antibiotics   History: Past Medical History:  Diagnosis Date  .  Glaucoma   . Hyperlipidemia   . Hypertension   . Hypothyroidism 10/02/2014  . Overactive bladder   . Squamous cell skin cancer, face    Past Surgical History:  Procedure Laterality Date  . ABDOMINAL HYSTERECTOMY    . BREAST SURGERY    . THYROIDECTOMY, PARTIAL Right    Family History  Problem Relation Age of Onset  . Hypertension Mother   . Diabetes Father   . Colon cancer Father   . Breast cancer Grandchild    Social History   Socioeconomic History  . Marital status: Widowed    Spouse name: Not on file  . Number of children: Not on file  . Years of education: Not on file  . Highest education level: Some college, no degree  Occupational History  . Not on file  Tobacco Use  . Smoking status: Never Smoker  . Smokeless tobacco: Never Used  Vaping Use  . Vaping Use: Never used  Substance and Sexual Activity  . Alcohol use: No  . Drug use: No  . Sexual activity: Not Currently    Birth control/protection: Surgical  Other Topics Concern  . Not on file  Social History Narrative  . Not on file   Social Determinants of Health   Financial Resource Strain: Low Risk   . Difficulty of Paying Living Expenses: Not hard at all  Food Insecurity: No Food Insecurity  . Worried About Programme researcher, broadcasting/film/videounning Out of Food in the Last Year: Never true  . Ran Out of Food in the Last Year: Never true  Transportation Needs: No Transportation Needs  . Lack of Transportation (Medical): No  . Lack of Transportation (Non-Medical): No  Physical Activity: Inactive  . Days of Exercise per Week: 0 days  . Minutes of Exercise per Session: 0 min  Stress: No Stress Concern Present  . Feeling of Stress : Not at all  Social Connections: Moderately Integrated  . Frequency of Communication with Friends and Family: More than three times a week  . Frequency of Social Gatherings with Friends and Family: More than three times a week  . Attends Religious Services: More than 4 times per year  . Active Member of Clubs or  Organizations: Yes  . Attends BankerClub or Organization Meetings: More than 4 times per year  . Marital Status: Widowed    Tobacco Counseling Counseling given: Not Answered   Clinical Intake:  Pre-visit preparation completed: Yes  Pain : No/denies pain     Nutritional Status: BMI of 19-24  Normal Nutritional Risks: None Diabetes: No  How often do you need to have someone help you when you read instructions, pamphlets, or other written materials from your doctor or pharmacy?: 1 - Never What is the last grade level you completed in school?: 5878yr college  Diabetic? no  Interpreter Needed?: No  Information entered by :: NAllen LPN   Activities of Daily Living In your present state of health, do you have any difficulty performing the following activities: 04/28/2020 03/10/2020  Hearing? Y N  Comment some -  Vision? N N  Difficulty concentrating or making decisions? N N  Walking or climbing stairs? N Y  Dressing or bathing? N N  Doing errands, shopping? N N  Preparing Food and eating ? N -  Using the Toilet? N -  In the past six months, have you accidently leaked urine? Y -  Do you have problems with loss of bowel control? Y -  Managing your Medications? N -  Managing your Finances? N -  Housekeeping or managing your Housekeeping? N -  Some recent data might be hidden    Patient Care Team: Valerie Roys, DO as PCP - General (Family Medicine) Oneta Rack, MD (Dermatology) Sharlotte Alamo, [place] (Podiatry) Samara Deist, DPM as Referring Physician (Podiatry) Vanita Ingles, RN as Case Manager (General Practice)  Indicate any recent Medical Services you may have received from other than [facility] providers in the past year (date may be approximate).     Assessment:   This is a routine wellness examination for Sumiya.  Hearing/Vision screen  Hearing Screening   125Hz  250Hz  500Hz  1000Hz  2000Hz  3000Hz  4000Hz  6000Hz  8000Hz   Right ear:           Left ear:           Vision  Screening Comments: Regular eye exams, Dr. Ellin Mayhew  Dietary issues and exercise activities discussed: Current Exercise Habits: The patient does not participate in regular exercise at present  Goals    . Increase water intake     Recommend drinking at least 6-7 glasses of water a day     . Patient Stated     04/28/2020, to walk more and get back out in the garden    . Prevent Falls    . RNCM: I take my blood pressure sometimes     CARE PLAN ENTRY (see longtitudinal plan of care for additional care plan information)  Current Barriers:  . Chronic Disease Management support, education, and care coordination needs related to HTN, HLD, and CKD Stage 3  Clinical Goal(s) related to HTN, HLD, and CKD Stage 3 :  Over the next 90days, patient will:  . Work with the care management team to address educational, disease management, and care coordination needs  . Begin or continue self health monitoring activities as directed today Measure and record blood pressure 3 times per week . Call provider office for new or worsened signs and symptoms Blood pressure findings outside established parameters and New or worsened symptom related to HLD/CKD3 and new concerns with back pain at times and left neck swelling . Call care management team with questions or concerns . Verbalize basic understanding of patient centered plan of care established today  Interventions related to HTN, HLD, and CKD Stage 3 :  . Evaluation of current treatment plans and patient's adherence to plan as established by provider. The patient is compliant with the plan of care.  . Assessed patient understanding of disease states. 10-30-2019: Has good understanding of chronic conditions. Ask questions today about her thyroid possibly being the reason why she is gain weight around her waist.  Advised the patient to write down questions to ask the pcp at her next appointment on 11-29-2019. . Assessed patient's education and care coordination  needs: The patient was locked out of her my chart.  Education on the user name and ask the patient to try as it was listed and if she had issues to let the RNCM know and the RNCM will assist by resetting the password for the patient. The patient verbalized that she would have her daughter in law who was there help her also. The patient verbalized  thanks and understanding. 10-30-2019: still has an issue at times getting in my Chart. Forgot to get her daughter to help. Will keep working with it.  . Printed the patients lab work per patient request.  The patient wanted a copy of her recent lab work in March. She can see it in my chart but wanted to have a physical copy to look at. Will place readings in mail for the patient today. 10-30-2019: Did request a copy of her lab work printed out when she has labs drawn. Instructed the patient to ask for a copy of lab work at the office when she is here. The patient thanked the [facility] for sending her labwork in mail from her last visit.  Marland Kitchen Provided disease specific education to patient. 10-30-2019: Education on pacing activities and not going outside when it is hot to work in her yard. The patient enjoys being outside. The patient is eating well and denies any deviation from Heart Healthy Diet. The patient states she is noticing a "pudgy" area at her midline and is concerned this has something to do with her thyroid. Will discuss with the pcp on next visit.  Nash Dimmer with appropriate clinical care team members regarding patient needs.  The patient denies any needs at this time. Knows the LCSW and pharmacist are here to help as needed.  . Evaluation of upcoming appointments. Sees Dr. Posey Pronto- 11-01-2019 about her feet and problems she is having with her feet. The patient has an appointment with pcp on 11-29-2019 at 9 am.  RNCM will follow up with the patient on 01-08-2020 at 10:30.  The patent will call for changes or needs before the next outreach.   Patient Self Care Activities  related to HTN, HLD, and CKD Stage 3 :  . Patient is unable to independently self-manage chronic health conditions  Please see past updates related to this goal by clicking on the "Past Updates" button in the selected goal        Depression Screen PHQ 2/9 Scores 04/28/2020 11/29/2019 11/27/2018 11/16/2018 11/14/2017 11/03/2016 10/29/2015  PHQ - 2 Score 0 1 1 1  0 0 0  PHQ- 9 Score - 3 3 - - - -    Fall Risk Fall Risk  04/28/2020 11/29/2019 11/27/2018 11/16/2018 11/14/2017  Falls in the past year? 0 0 0 0 No  Number falls in past yr: - 0 0 - -  Injury with Fall? - 0 0 - -  Risk for fall due to : Medication side effect - - - -  Follow up Falls evaluation completed;Education provided;Falls prevention discussed - - - -    FALL RISK PREVENTION PERTAINING TO THE HOME:  Any stairs in or around the home? Yes  If so, are there any without handrails? No  Home free of loose throw rugs in walkways, pet beds, electrical cords, etc? Yes  Adequate lighting in your home to reduce risk of falls? Yes   ASSISTIVE DEVICES UTILIZED TO PREVENT FALLS:  Life alert? Yes  Use of a cane, walker or w/c? No  Grab bars in the bathroom? Yes  Shower chair or bench in shower? No  Elevated toilet seat or a handicapped toilet? Yes   TIMED UP AND GO:  Was the test performed? No .  Cognitive Function:     6CIT Screen 04/28/2020 11/14/2017 11/03/2016  What Year? 0 points 0 points 0 points  What month? 0 points 0 points 0 points  What time? 0 points 0 points 0  points  Count back from 20 0 points 0 points 0 points  Months in reverse 0 points 0 points 0 points  Repeat phrase 0 points 0 points 0 points  Total Score 0 0 0    Immunizations Immunization History  Administered Date(s) Administered  . Fluad Quad(high Dose 65+) 11/27/2018, 11/29/2019  . Influenza, High Dose Seasonal PF 01/30/2016, 05/13/2017, 11/24/2017  . Influenza,inj,Quad PF,6+ Mos 01/21/2015  . Influenza-Unspecified 11/24/2017  . PFIZER(Purple  Top)SARS-COV-2 Vaccination 04/20/2019, 05/11/2019, 03/18/2020  . Pneumococcal Conjugate-13 10/01/2013  . Pneumococcal Polysaccharide-23 06/06/2006, 09/28/2006  . Td 09/28/2006, 11/10/2016    TDAP status: Up to date  Flu Vaccine status: Up to date  Pneumococcal vaccine status: Up to date  Covid-19 vaccine status: Completed vaccines  Qualifies for Shingles Vaccine? Yes   Zostavax completed No   Shingrix Completed?: No.    Education has been provided regarding the importance of this vaccine. Patient has been advised to call insurance company to determine out of pocket expense if they have not yet received this vaccine. Advised may also receive vaccine at local pharmacy or Health Dept. Verbalized acceptance and understanding.  Screening Tests Health Maintenance  Topic Date Due  . TETANUS/TDAP  11/11/2026  . INFLUENZA VACCINE  Completed  . DEXA SCAN  Completed  . COVID-19 Vaccine  Completed  . PNA vac Low Risk Adult  Completed    Health Maintenance  There are no preventive care reminders to display for this patient.  Colorectal cancer screening: No longer required.   Mammogram status: No longer required due to age.  Bone Density status: Completed 10/02/2013. Results reflect: Bone density results: NORMAL. Repeat every 0 years.  Lung Cancer Screening: (Low Dose CT Chest recommended if Age 61-80 years, 30 pack-year currently smoking OR have quit w/in 15years.) does not qualify.   Lung Cancer Screening Referral: no  Additional Screening:  Hepatitis C Screening: does not qualify;   Vision Screening: Recommended annual ophthalmology exams for early detection of glaucoma and other disorders of the eye. Is the patient up to date with their annual eye exam?  Yes  Who is the provider or what is the name of the office in which the patient attends annual eye exams? Dr. Ellin Mayhew If pt is not established with a provider, would they like to be referred to a provider to establish care? No .    Dental Screening: Recommended annual dental exams for proper oral hygiene  Community Resource Referral / Chronic Care Management: CRR required this visit?  No   CCM required this visit?  No      Plan:     I have personally reviewed and noted the following in the patient's chart:   . Medical and social history . Use of alcohol, tobacco or illicit drugs  . Current medications and supplements . Functional ability and status . Nutritional status . Physical activity . Advanced directives . List of other physicians . Hospitalizations, surgeries, and ER visits in previous 12 months . Vitals . Screenings to include cognitive, depression, and falls . Referrals and appointments  In addition, I have reviewed and discussed with patient certain preventive protocols, quality metrics, and best practice recommendations. A written personalized care plan for preventive services as well as general preventive health recommendations were provided to patient.     Kellie Simmering, LPN   [id number]   Nurse Notes:

## 2020-04-30 ENCOUNTER — Encounter: Payer: PPO | Admitting: Speech Pathology

## 2020-05-02 ENCOUNTER — Telehealth: Payer: PPO | Admitting: General Practice

## 2020-05-02 ENCOUNTER — Ambulatory Visit (INDEPENDENT_AMBULATORY_CARE_PROVIDER_SITE_OTHER): Payer: PPO | Admitting: General Practice

## 2020-05-02 DIAGNOSIS — I129 Hypertensive chronic kidney disease with stage 1 through stage 4 chronic kidney disease, or unspecified chronic kidney disease: Secondary | ICD-10-CM | POA: Diagnosis not present

## 2020-05-02 DIAGNOSIS — E785 Hyperlipidemia, unspecified: Secondary | ICD-10-CM

## 2020-05-02 NOTE — Chronic Care Management (AMB) (Signed)
Chronic Care Management   CCM RN Visit Note  05/02/2020 Name: Cassie Brewer MRN: [id number] DOB: 09-28-1928  Subjective: Cassie Brewer is a 87 y.o. year old female who is a primary care patient of Valerie Roys, DO. The care management team was consulted for assistance with disease management and care coordination needs.    Engaged with patient by telephone for follow up visit in response to provider referral for case management and/or care coordination services.   Consent to Services:  The patient was given information about Chronic Care Management services, agreed to services, and gave verbal consent prior to initiation of services.  Please see initial visit note for detailed documentation.   Patient agreed to services and verbal consent obtained.   Assessment: Review of patient past medical history, allergies, medications, health status, including review of consultants reports, laboratory and other test data, was performed as part of comprehensive evaluation and provision of chronic care management services.   SDOH (Social Determinants of Health) assessments and interventions performed:    CCM Care Plan  Allergies  Allergen Reactions  . Amoxicillin     Other reaction(s): Other (See Comments) "blood pressure very high - almost passed out"  . Eryc [Erythromycin] Other (See Comments)  . Sulfa Antibiotics Nausea And Vomiting    Nausea with bactrim in 2017  Nausea with bactrim in 2017     Outpatient Encounter Medications as of 05/02/2020  Medication Sig  . aspirin EC 81 MG EC tablet Take 1 tablet (81 mg total) by mouth daily. Swallow whole.  Marland Kitchen atorvastatin (LIPITOR) 80 MG tablet Take 1 tablet (80 mg total) by mouth daily.  . benazepril (LOTENSIN) 40 MG tablet Take 1 tablet (40 mg total) by mouth daily. (Patient taking differently: Take 40 mg by mouth every evening.)  . Calcium Carbonate-Vit D-Min (CALCIUM 600+D PLUS MINERALS) 600-400 MG-UNIT TABS Take 1 tablet by mouth as  directed.  . famotidine (PEPCID) 40 MG tablet TAKE 1 TABLET BY MOUTH EVERYDAY AT BEDTIME (Patient taking differently: Take 40 mg by mouth at bedtime as needed for heartburn.)  . fluticasone (FLONASE) 50 MCG/ACT nasal spray Place 2 sprays into both nostrils daily as needed for allergies. (Patient not taking: No sig reported)  . hydrochlorothiazide (HYDRODIURIL) 25 MG tablet Take 1 tablet (25 mg total) by mouth daily.  . hydrocortisone (ANUSOL-HC) 2.5 % rectal cream Place 1 application rectally 2 (two) times daily.  Marland Kitchen latanoprost (XALATAN) 0.005 % ophthalmic solution Place 1 drop into both eyes at bedtime.  Marland Kitchen levothyroxine (SYNTHROID) 112 MCG tablet Take 1 tablet (112 mcg total) by mouth daily before breakfast.  . Multiple Vitamins-Minerals (CENTRUM SILVER ULTRA WOMENS PO) Take 1 tablet by mouth daily.  . Omega-3 Fatty Acids (FISH OIL) 1000 MG CAPS Take 1,000 mg by mouth daily.  . polyethylene glycol powder (GLYCOLAX/MIRALAX) 17 GM/SCOOP powder Take 17 g by mouth 2 (two) times daily as needed.  . timolol (TIMOPTIC) 0.5 % ophthalmic solution Place 1 drop into the left eye daily.  . vitamin C (ASCORBIC ACID) 500 MG tablet Take 500 mg by mouth daily.   No facility-administered encounter medications on file as of 05/02/2020.    Patient Active Problem List   Diagnosis Date Noted  . Aphasia 03/21/2020  . Aphasia S/P CVA 03/11/2020  . TIA (transient ischemic attack) 03/10/2020  . 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 Arthropathy 04/24/2015  . Benign hypertensive renal disease 10/02/2014  . Hypothyroidism 10/02/2014  . Hyperlipidemia 10/02/2014  . CKD (chronic  kidney disease), stage III ([place]) 10/02/2014    Conditions to be addressed/monitored:HTN and HLD  Care Plan : RNCM: Hypertension (Adult)  Updates made by Vanita Ingles since 05/02/2020 12:00 AM    Problem: RNCM: Hypertension (Hypertension)   Priority: Medium    Goal: RNCM: Hypertension Monitored   Priority: Medium  Note:   Objective:   . Last practice recorded BP readings:  BP Readings from Last 3 Encounters:  04/10/20 (!) 143/73  03/25/20 (!) 136/49  03/20/20 120/71 .   Marland Kitchen Most recent eGFR/CrCl: No results found for: EGFR  No components found for: CRCL Current Barriers:  Marland Kitchen Knowledge Deficits related to basic understanding of hypertension pathophysiology and self care management . Knowledge Deficits related to understanding of medications prescribed for management of hypertension . Unable to independently manage HTN . Does not contact provider office for questions/concerns Case Manager Clinical Goal(s):  Marland Kitchen Over the next 120 days, patient will verbalize understanding of plan for hypertension management . Over the next 120 days, patient will attend all scheduled medical appointments: 05-28-2020 . Over the next 120 days, patient will demonstrate improved adherence to prescribed treatment plan for hypertension as evidenced by taking all medications as prescribed, monitoring and recording blood pressure as directed, adhering to low sodium/DASH diet . Over the next 120 days, patient will demonstrate improved health management independence as evidenced by checking blood pressure as directed and notifying PCP if SBP>160 or DBP > 90, taking all medications as prescribe, and adhering to a low sodium diet as discussed. . Over the next 120 days, patient will verbalize basic understanding of hypertension disease process and self health management plan as evidenced by normalized blood pressures, managing stressors to maintain blood pressure and working with CCM team to optimize health and well being.  Interventions:  . Collaboration with Valerie Roys, DO regarding development and update of comprehensive plan of care as evidenced by provider attestation and co-signature . Inter-disciplinary care team collaboration (see longitudinal plan of care) . Evaluation of current treatment plan related to hypertension self management and patient's  adherence to plan as established by provider. . Provided education to patient re: stroke prevention, s/s of heart attack and stroke, DASH diet, complications of uncontrolled blood pressure . Reviewed medications with patient and discussed importance of compliance . Discussed plans with patient for ongoing care management follow up and provided patient with direct contact information for care management team . Advised patient, providing education and rationale, to monitor blood pressure daily and record, calling PCP for findings outside established parameters.  . Reviewed scheduled/upcoming provider appointments including: 05-28-2020 Patient Goals/Self-Care Activities . Over the next 120 days, patient will:  - Self administers medications as prescribed Attends all scheduled provider appointments Calls provider office for new concerns, questions, or BP outside discussed parameters Checks BP and records as discussed Follows a low sodium diet/DASH diet - blood pressure trends reviewed - depression screen reviewed - home or ambulatory blood pressure monitoring encouraged Follow Up Plan: Telephone follow up appointment with care management team member scheduled for: 07-04-2020 at 0945 am   Task: RNCM: Identify and Monitor Blood Pressure Elevation   Note:   Care Management Activities:    - blood pressure trends reviewed - depression screen reviewed - home or ambulatory blood pressure monitoring encouraged       Care Plan : RNCM: HLD Management  Updates made by Vanita Ingles since 05/02/2020 12:00 AM    Problem: RNCM: HLD   Priority: Medium  Goal: RNCM: HLD   Priority: Medium  Note:   Current Barriers:  . Poorly controlled hyperlipidemia, complicated by HTN, constipation  . Current antihyperlipidemic regimen: atorvastatin 80 mg QD . Most recent lipid panel:     Component Value Date/Time   CHOL 165 04/11/2020 1159   CHOL 211 (H) 05/05/2016 0926   TRIG 67 04/11/2020 1159   TRIG 77  05/05/2016 0926   HDL 74 04/11/2020 1159   CHOLHDL 3.3 03/11/2020 0612   VLDL 13 03/11/2020 0612   VLDL 15 05/05/2016 0926   LDLCALC 78 04/11/2020 1159 .   Marland Kitchen ASCVD risk enhancing conditions: age >29, HTN, CKD . Unable to independently manage HLD . Does not contact provider office for questions/concerns  RN Care Manager Clinical Goal(s):  Marland Kitchen Over the next 120 days, patient will work with Consulting civil engineer, providers, and care team towards execution of optimized self-health management plan . Over the next 120 days, patient will verbalize understanding of plan for effective management of HLD  . Over the next 120 days, patient will work with [place], and pcp to address needs related to changes in chronic conditions  . Over the next 120 days, patient will attend all scheduled medical appointments: 05-28-2020  Interventions: . Collaboration with Valerie Roys, DO regarding development and update of comprehensive plan of care as evidenced by provider attestation and co-signature . Inter-disciplinary care team collaboration (see longitudinal plan of care) . Medication review performed; medication list updated in electronic medical record.  Bertram Savin care team collaboration (see longitudinal plan of care) . Referred to pharmacy team for assistance with HLD medication management . Evaluation of current treatment plan related to HLD  and patient's adherence to plan as established by provider. . Advised patient to call the office for questions or concerns . Provided education to patient re: heart healthy diet, maintaining healthy weight, ways to manage bowel movements, and support given for questions related to chronic conditions  . Reviewed scheduled/upcoming provider appointments including: 05-28-2020 . Discussed plans with patient for ongoing care management follow up and provided patient with direct contact information for care management team   Patient Goals/Self-Care Activities: . Over  the next 120 days, patient will:   - call for medicine refill 2 or 3 days before it runs out - call if I am sick and can't take my medicine - keep a list of all the medicines I take; vitamins and herbals too - learn to read medicine labels - use a pillbox to sort medicine - use an alarm clock or phone to remind me to take my medicine - change to whole grain breads, cereal, pasta - drink 6 to 8 glasses of water each day - eat 3 to 5 servings of fruits and vegetables each day - eat 5 or 6 small meals each day - limit fast food meals to no more than 1 per week - manage portion size - prepare main meal at home 3 to 5 days each week - read food labels for fat, fiber, carbohydrates and portion size - be open to making changes - I can manage, know and watch for signs of a heart attack - if I have chest pain, call for help - learn about small changes that will make a big difference - learn my personal risk factors - administration or use of medication demonstrated - barriers to medication adherence identified - medication list compiled - medication list reviewed - medication-adherence assessment completed - self-management plan initiated or updated -  strategies for improving adherence encouraged - understanding of current medications assessed  Follow Up Plan: Telephone follow up appointment with care management team member scheduled for: 07-04-2020 at 0945 am     Task: RNCM: HLD management   Note:   Care Management Activities:    - administration or use of medication demonstrated - barriers to medication adherence identified - medication list compiled - medication list reviewed - medication-adherence assessment completed - self-management plan initiated or updated - strategies for improving adherence encouraged - understanding of current medications assessed         Plan:Telephone follow up appointment with care management team member scheduled for:  07-04-2020 at [place] am  [place], MSN, [facility] Mobile: [phone]

## 2020-05-02 NOTE — Patient Instructions (Signed)
Visit Information  PATIENT GOALS: Goals Addressed            This Visit's Progress   . COMPLETED: RNCM: I take my blood pressure sometimes       CARE PLAN ENTRY (see longtitudinal plan of care for additional care plan information)  Current Barriers: Closing this goal and opening in new ELS . Chronic Disease Management support, education, and care coordination needs related to HTN, HLD, and CKD Stage 3  Clinical Goal(s) related to HTN, HLD, and CKD Stage 3 :  Over the next 90days, patient will:  . Work with the care management team to address educational, disease management, and care coordination needs  . Begin or continue self health monitoring activities as directed today Measure and record blood pressure 3 times per week . Call provider office for new or worsened signs and symptoms Blood pressure findings outside established parameters and New or worsened symptom related to HLD/CKD3 and new concerns with back pain at times and left neck swelling . Call care management team with questions or concerns . Verbalize basic understanding of patient centered plan of care established today  Interventions related to HTN, HLD, and CKD Stage 3 :  . Evaluation of current treatment plans and patient's adherence to plan as established by provider. The patient is compliant with the plan of care.  . Assessed patient understanding of disease states. 10-30-2019: Has good understanding of chronic conditions. Ask questions today about her thyroid possibly being the reason why she is gain weight around her waist.  Advised the patient to write down questions to ask the pcp at her next appointment on 11-29-2019. . Assessed patient's education and care coordination needs: The patient was locked out of her my chart.  Education on the user name and ask the patient to try as it was listed and if she had issues to let the RNCM know and the RNCM will assist by resetting the password for the patient. The patient  verbalized that she would have her daughter in law who was there help her also. The patient verbalized thanks and understanding. 10-30-2019: still has an issue at times getting in my Chart. Forgot to get her daughter to help. Will keep working with it.  . Printed the patients lab work per patient request.  The patient wanted a copy of her recent lab work in March. She can see it in my chart but wanted to have a physical copy to look at. Will place readings in mail for the patient today. 10-30-2019: Did request a copy of her lab work printed out when she has labs drawn. Instructed the patient to ask for a copy of lab work at the office when she is here. The patient thanked the [facility] for sending her labwork in mail from her last visit.  Marland Kitchen Provided disease specific education to patient. 10-30-2019: Education on pacing activities and not going outside when it is hot to work in her yard. The patient enjoys being outside. The patient is eating well and denies any deviation from Heart Healthy Diet. The patient states she is noticing a "pudgy" area at her midline and is concerned this has something to do with her thyroid. Will discuss with the pcp on next visit.  Nash Dimmer with appropriate clinical care team members regarding patient needs.  The patient denies any needs at this time. Knows the LCSW and pharmacist are here to help as needed.  . Evaluation of upcoming appointments. Sees Dr. Posey Pronto- 11-01-2019 about  her feet and problems she is having with her feet. The patient has an appointment with pcp on 11-29-2019 at 9 am.  RNCM will follow up with the patient on 01-08-2020 at 10:30.  The patent will call for changes or needs before the next outreach.   Patient Self Care Activities related to HTN, HLD, and CKD Stage 3 :  . Patient is unable to independently self-manage chronic health conditions  Please see past updates related to this goal by clicking on the "Past Updates" button in the selected goal          Patient verbalizes understanding of instructions provided today and agrees to view in [place].   Telephone follow up appointment with care management team member scheduled for: 07-04-2020 at [place] am  Noreene Larsson RN, MSN, [facility] Mobile: [phone]

## 2020-05-05 ENCOUNTER — Encounter: Payer: PPO | Admitting: Speech Pathology

## 2020-05-07 ENCOUNTER — Encounter: Payer: PPO | Admitting: Speech Pathology

## 2020-05-08 ENCOUNTER — Other Ambulatory Visit: Payer: Self-pay

## 2020-05-08 ENCOUNTER — Ambulatory Visit: Payer: PPO | Admitting: Podiatry

## 2020-05-08 ENCOUNTER — Encounter: Payer: Self-pay | Admitting: Podiatry

## 2020-05-08 DIAGNOSIS — B351 Tinea unguium: Secondary | ICD-10-CM | POA: Diagnosis not present

## 2020-05-08 DIAGNOSIS — M79674 Pain in right toe(s): Secondary | ICD-10-CM | POA: Diagnosis not present

## 2020-05-08 DIAGNOSIS — M79675 Pain in left toe(s): Secondary | ICD-10-CM

## 2020-05-08 DIAGNOSIS — B353 Tinea pedis: Secondary | ICD-10-CM

## 2020-05-08 MED ORDER — CLOTRIMAZOLE-BETAMETHASONE 1-0.05 % EX CREA: 1.0000 "application " | TOPICAL_CREAM | Freq: Two times a day (BID) | CUTANEOUS | 0 refills | Status: AC

## 2020-05-08 NOTE — Progress Notes (Signed)
  Subjective:  Patient ID: Cassie Brewer, female    DOB: September 26, 1928,  MRN: [id number]  No chief complaint on file.  87 y.o. female returns for the above complaint.  Patient presents with thickened elongated mycotic toenails x10.  Patient is especially concerned for right hallux onychomycosis.  Patient not able to debride on herself.  She would like for [place] to debride down.  She also has secondary complaint of bilateral athlete's foot with itching.  She would like to know if there is any treatment options for this. Objective:   There were no vitals filed for this visit. Podiatric Exam: Vascular: dorsalis pedis and posterior tibial pulses are palpable bilateral. Capillary return is immediate. Temperature gradient is WNL. Skin turgor WNL  Sensorium: Normal Semmes Weinstein monofilament test. Normal tactile sensation bilaterally.  However she experiences subjective neuropathic pain to the distal tip of the toes.  Intact sensation Nail Exam: Pt has thick disfigured discolored nails with subungual debris noted bilateral entire nail hallux through fifth toenails.  Ingrown noted to the right hallux lateral border.  Mild pain on palpation. Ulcer Exam: There is no evidence of ulcer or pre-ulcerative changes or infection. Orthopedic Exam: Muscle tone and strength are WNL. No limitations in general ROM. No crepitus or effusions noted. HAV  B/L.  Hammer toes 2-5  B/L. Skin: No Porokeratosis. No infection or ulcers.  Xerosis/psoriasis of the plantar foot with now subjective component of itching..  There is mild cracking noted.  No bleeding noted.  Assessment & Plan:  Patient was evaluated and treated and all questions answered.  Athlete's foot bilateral -Explained to patient the etiology of athlete's foot and various treatment options were discussed.  Given that there is subjective complaint of itching associated with the bottom of the foot I believe she will benefit from Lotrisone cream.  I have asked her to  apply twice a day.  She states understanding.  Neuropathic pain -I explained to the patient the etiology of neuropathic pain and various treatment options were extensively discussed.  I believe she will benefit from neuropathic cream to help decrease the pain associated with this -Prescription for neuropathic cream was sent to [facility] drug pharmacy.  Xerosis/psoriasis -I explained to the patient the etiology of dryness with cracking as well as various treatment options associated with it.  Given that she is doing good with clotrimazole cream that was given to her by dermatologist and had diagnosed her with psoriasis, I believe she can continue using the cream.  Patient agrees with this plan.  Onychomycosis with pain  -Nails palliatively debrided as below. -Educated on self-care  Procedure: Nail Debridement Rationale: pain  Type of Debridement: manual, sharp debridement. Instrumentation: Nail nipper, rotary burr. Number of Nails: 10  Procedures and Treatment: Consent by patient was obtained for treatment procedures. The patient understood the discussion of treatment and procedures well. All questions were answered thoroughly reviewed. Debridement of mycotic and hypertrophic toenails, 1 through 5 bilateral and clearing of subungual debris. No ulceration, no infection noted.  Return Visit-Office Procedure: Patient instructed to return to the office for a follow up visit 3 months for continued evaluation and treatment.  Boneta Lucks, DPM    No follow-ups on file.

## 2020-05-12 ENCOUNTER — Encounter: Payer: PPO | Admitting: Speech Pathology

## 2020-05-13 DIAGNOSIS — I499 Cardiac arrhythmia, unspecified: Secondary | ICD-10-CM | POA: Diagnosis not present

## 2020-05-13 DIAGNOSIS — R0600 Dyspnea, unspecified: Secondary | ICD-10-CM | POA: Diagnosis not present

## 2020-05-13 DIAGNOSIS — E782 Mixed hyperlipidemia: Secondary | ICD-10-CM | POA: Diagnosis not present

## 2020-05-13 DIAGNOSIS — N1831 Chronic kidney disease, stage 3a: Secondary | ICD-10-CM | POA: Diagnosis not present

## 2020-05-13 DIAGNOSIS — I639 Cerebral infarction, unspecified: Secondary | ICD-10-CM | POA: Diagnosis not present

## 2020-05-13 DIAGNOSIS — I10 Essential (primary) hypertension: Secondary | ICD-10-CM | POA: Diagnosis not present

## 2020-05-13 DIAGNOSIS — E079 Disorder of thyroid, unspecified: Secondary | ICD-10-CM | POA: Diagnosis not present

## 2020-05-14 ENCOUNTER — Encounter: Payer: PPO | Admitting: Speech Pathology

## 2020-05-15 DIAGNOSIS — Z85828 Personal history of other malignant neoplasm of skin: Secondary | ICD-10-CM | POA: Diagnosis not present

## 2020-05-15 DIAGNOSIS — X32XXXA Exposure to sunlight, initial encounter: Secondary | ICD-10-CM | POA: Diagnosis not present

## 2020-05-15 DIAGNOSIS — Z08 Encounter for follow-up examination after completed treatment for malignant neoplasm: Secondary | ICD-10-CM | POA: Diagnosis not present

## 2020-05-15 DIAGNOSIS — L814 Other melanin hyperpigmentation: Secondary | ICD-10-CM | POA: Diagnosis not present

## 2020-05-15 DIAGNOSIS — L570 Actinic keratosis: Secondary | ICD-10-CM | POA: Diagnosis not present

## 2020-05-19 ENCOUNTER — Encounter: Payer: PPO | Admitting: Speech Pathology

## 2020-05-21 ENCOUNTER — Encounter: Payer: PPO | Admitting: Speech Pathology

## 2020-05-23 DIAGNOSIS — R4781 Slurred speech: Secondary | ICD-10-CM | POA: Diagnosis not present

## 2020-05-23 DIAGNOSIS — Z8673 Personal history of transient ischemic attack (TIA), and cerebral infarction without residual deficits: Secondary | ICD-10-CM

## 2020-05-23 DIAGNOSIS — R4701 Aphasia: Secondary | ICD-10-CM | POA: Insufficient documentation

## 2020-05-23 HISTORY — DX: Personal history of transient ischemic attack (TIA), and cerebral infarction without residual deficits: Z86.73

## 2020-05-26 ENCOUNTER — Encounter: Payer: PPO | Admitting: Speech Pathology

## 2020-05-28 ENCOUNTER — Encounter: Payer: Self-pay | Admitting: Family Medicine

## 2020-05-28 ENCOUNTER — Other Ambulatory Visit: Payer: Self-pay

## 2020-05-28 ENCOUNTER — Encounter: Payer: PPO | Admitting: Speech Pathology

## 2020-05-28 ENCOUNTER — Ambulatory Visit (INDEPENDENT_AMBULATORY_CARE_PROVIDER_SITE_OTHER): Payer: PPO | Admitting: Family Medicine

## 2020-05-28 VITALS — BP 145/77 | HR 64 | Temp 97.8°F | Wt 149.4 lb

## 2020-05-28 DIAGNOSIS — E039 Hypothyroidism, unspecified: Secondary | ICD-10-CM | POA: Diagnosis not present

## 2020-05-28 DIAGNOSIS — N183 Chronic kidney disease, stage 3 unspecified: Secondary | ICD-10-CM

## 2020-05-28 DIAGNOSIS — I129 Hypertensive chronic kidney disease with stage 1 through stage 4 chronic kidney disease, or unspecified chronic kidney disease: Secondary | ICD-10-CM | POA: Diagnosis not present

## 2020-05-28 DIAGNOSIS — E785 Hyperlipidemia, unspecified: Secondary | ICD-10-CM

## 2020-05-28 DIAGNOSIS — I10 Essential (primary) hypertension: Secondary | ICD-10-CM

## 2020-05-28 MED ORDER — BENAZEPRIL HCL 40 MG PO TABS: 40.0000 mg | ORAL_TABLET | Freq: Every day | ORAL | 1 refills | Status: DC

## 2020-05-28 MED ORDER — HYDROCHLOROTHIAZIDE 25 MG PO TABS: 25.0000 mg | ORAL_TABLET | Freq: Every day | ORAL | 1 refills | Status: DC

## 2020-05-28 NOTE — Assessment & Plan Note (Signed)
Rechecking labs today. Await results. Treat as needed.  °

## 2020-05-28 NOTE — Assessment & Plan Note (Signed)
Under good control on current regimen. Continue current regimen. Continue to monitor. Call with any concerns. Refills given. Labs drawn today.   

## 2020-05-28 NOTE — Progress Notes (Signed)
BP (!) 145/77   Pulse 64   Temp 97.8 F (36.6 C)   Wt 149 lb 6.4 oz (67.8 kg)   LMP 05/13/1980   SpO2 96%   BMI 25.64 kg/m    Subjective:    Patient ID: Cassie Brewer, female    DOB: [birth year], 87 y.o.   MRN: [id number]  HPI: Cassie Brewer is a 87 y.o. female  Chief Complaint  Patient presents with  . Hypothyroidism  . Hyperlipidemia  . Hypertension  . Chronic Kidney Disease   HYPERTENSION / [place] Satisfied with current treatment? yes Duration of hypertension: chronic BP monitoring frequency: not checking BP medication side effects: no Past BP meds: HCTZ, benazepril Duration of hyperlipidemia: chronic Cholesterol medication side effects: no Cholesterol supplements: none Past cholesterol medications: atorvastatin Medication compliance: excellent compliance Aspirin: yes Recent stressors: no Recurrent headaches: no Visual changes: no Palpitations: no Dyspnea: no Chest pain: no Lower extremity edema: no Dizzy/lightheaded: no  HYPOTHYROIDISM Thyroid control status:controlled Satisfied with current treatment? yes Medication side effects: no Medication compliance: excellent compliance Recent dose adjustment:no Fatigue: no Cold intolerance: no Heat intolerance: no Weight gain: no Weight loss: no Constipation: no Diarrhea/loose stools: no Palpitations: no Lower extremity edema: no Anxiety/depressed mood: no  Relevant past medical, surgical, family and social history reviewed and updated as indicated. Interim medical history since our last visit reviewed. Allergies and medications reviewed and updated.  Review of Systems  Constitutional: Negative.   Respiratory: Negative.   Cardiovascular: Negative.   Gastrointestinal: Negative.   Musculoskeletal: Negative.   Psychiatric/Behavioral: Negative.     Per HPI unless specifically indicated above     Objective:    BP (!) 145/77   Pulse 64   Temp 97.8 F (36.6 C)   Wt 149 lb 6.4 oz (67.8  kg)   LMP 05/13/1980   SpO2 96%   BMI 25.64 kg/m   Wt Readings from Last 3 Encounters:  05/28/20 149 lb 6.4 oz (67.8 kg)  04/28/20 144 lb (65.3 kg)  03/25/20 147 lb 11.3 oz (67 kg)    Physical Exam Vitals and nursing note reviewed.  Constitutional:      General: She is not in acute distress.    Appearance: Normal appearance. She is not ill-appearing, toxic-appearing or diaphoretic.  HENT:     Head: Normocephalic and atraumatic.     Right Ear: External ear normal.     Left Ear: External ear normal.     Nose: Nose normal.     Mouth/Throat:     Mouth: Mucous membranes are moist.     Pharynx: Oropharynx is clear.  Eyes:     General: No scleral icterus.       Right eye: No discharge.        Left eye: No discharge.     Extraocular Movements: Extraocular movements intact.     Conjunctiva/sclera: Conjunctivae normal.     Pupils: Pupils are equal, round, and reactive to light.  Cardiovascular:     Rate and Rhythm: Normal rate and regular rhythm.     Pulses: Normal pulses.     Heart sounds: Normal heart sounds. No murmur heard. No friction rub. No gallop.   Pulmonary:     Effort: Pulmonary effort is normal. No respiratory distress.     Breath sounds: Normal breath sounds. No stridor. No wheezing, rhonchi or rales.  Chest:     Chest wall: No tenderness.  Musculoskeletal:        General: Normal range of  motion.     Cervical back: Normal range of motion and neck supple.  Skin:    General: Skin is warm and dry.     Capillary Refill: Capillary refill takes less than 2 seconds.     Coloration: Skin is not jaundiced or pale.     Findings: No bruising, erythema, lesion or rash.  Neurological:     General: No focal deficit present.     Mental Status: She is alert and oriented to person, place, and time. Mental status is at baseline.  Psychiatric:        Mood and Affect: Mood normal.        Behavior: Behavior normal.        Thought Content: Thought content normal.        Judgment:  Judgment normal.     Results for orders placed or performed in visit on 04/10/20  CBC with Differential/Platelet  Result Value Ref Range   WBC 6.6 3.4 - 10.8 x10E3/uL   RBC 3.93 3.77 - 5.28 x10E6/uL   Hemoglobin 12.1 11.1 - 15.9 g/dL   Hematocrit 34.1 34.0 - 46.6 %   MCV 87 79 - 97 fL   MCH 30.8 26.6 - 33.0 pg   MCHC 35.5 31.5 - 35.7 g/dL   RDW 12.6 11.7 - 15.4 %   Platelets 333 150 - 450 x10E3/uL   Neutrophils 65 Not Estab. %   Lymphs 23 Not Estab. %   Monocytes 8 Not Estab. %   Eos 2 Not Estab. %   Basos 1 Not Estab. %   Neutrophils Absolute 4.3 1.4 - 7.0 x10E3/uL   Lymphocytes Absolute 1.5 0.7 - 3.1 x10E3/uL   Monocytes Absolute 0.5 0.1 - 0.9 x10E3/uL   EOS (ABSOLUTE) 0.1 0.0 - 0.4 x10E3/uL   Basophils Absolute 0.1 0.0 - 0.2 x10E3/uL   Immature Granulocytes 1 Not Estab. %   Immature Grans (Abs) 0.0 0.0 - 0.1 x10E3/uL  Comprehensive metabolic panel  Result Value Ref Range   Glucose 103 (H) 65 - 99 mg/dL   BUN 18 8 - 27 mg/dL   Creatinine, Ser 1.02 (H) 0.57 - 1.00 mg/dL   GFR calc non Af Amer 49 (L) >59 mL/min/1.73   GFR calc Af Amer 56 (L) >59 mL/min/1.73   BUN/Creatinine Ratio 18 12 - 28   Sodium 135 134 - 144 mmol/L   Potassium 4.0 3.5 - 5.2 mmol/L   Chloride 97 96 - 106 mmol/L   CO2 26 20 - 29 mmol/L   Calcium 9.6 8.7 - 10.3 mg/dL   Total Protein 7.3 6.0 - 8.5 g/dL   Albumin 4.4 3.6 - 4.6 g/dL   Globulin, Total 2.9 1.5 - 4.5 g/dL   Albumin/Globulin Ratio 1.5 1.2 - 2.2   Bilirubin Total 0.4 0.0 - 1.2 mg/dL   Alkaline Phosphatase 60 44 - 121 IU/L   AST 20 0 - 40 IU/L   ALT 14 0 - 32 IU/L  Lipid Panel w/o Chol/HDL Ratio  Result Value Ref Range   Cholesterol, Total 165 100 - 199 mg/dL   Triglycerides 67 0 - 149 mg/dL   HDL 74 >39 mg/dL   VLDL Cholesterol Cal 13 5 - 40 mg/dL   LDL Chol Calc (NIH) 78 0 - 99 mg/dL      Assessment & Plan:   Problem List Items Addressed This Visit      Endocrine   Hypothyroidism - Primary    Rechecking labs today. Await  results. Treat as needed.  Relevant Orders   Comprehensive metabolic panel   TSH     Genitourinary   Benign hypertensive renal disease    Under good control on current regimen. Continue current regimen. Continue to monitor. Call with any concerns. Refills given. Labs drawn today.        Relevant Medications   benazepril (LOTENSIN) 40 MG tablet   hydrochlorothiazide (HYDRODIURIL) 25 MG tablet   Other Relevant Orders   Comprehensive metabolic panel   CKD (chronic kidney disease), stage III ([place])    Rechecking labs today. Await results. Treat as needed.       Relevant Orders   CBC with Differential/Platelet   Comprehensive metabolic panel     Other   Hyperlipidemia    Under good control on current regimen. Continue current regimen. Continue to monitor. Call with any concerns. Refills given. Labs drawn today.        Relevant Medications   benazepril (LOTENSIN) 40 MG tablet   hydrochlorothiazide (HYDRODIURIL) 25 MG tablet   Other Relevant Orders   Comprehensive metabolic panel   Lipid Panel w/o Chol/HDL Ratio    Other Visit Diagnoses    Essential hypertension       Relevant Medications   benazepril (LOTENSIN) 40 MG tablet   hydrochlorothiazide (HYDRODIURIL) 25 MG tablet       Follow up plan: Return in about 6 months (around 11/28/2020).

## 2020-05-29 LAB — CBC WITH DIFFERENTIAL/PLATELET
Basophils Absolute: 0.1 10*3/uL (ref 0.0–0.2)
Basos: 1 %
EOS (ABSOLUTE): 0.2 10*3/uL (ref 0.0–0.4)
Eos: 3 %
Hematocrit: 34.1 % (ref 34.0–46.6)
Hemoglobin: 11.5 g/dL (ref 11.1–15.9)
Immature Grans (Abs): 0 10*3/uL (ref 0.0–0.1)
Immature Granulocytes: 0 %
Lymphocytes Absolute: 1.2 10*3/uL (ref 0.7–3.1)
Lymphs: 21 %
MCH: 30.1 pg (ref 26.6–33.0)
MCHC: 33.7 g/dL (ref 31.5–35.7)
MCV: 89 fL (ref 79–97)
Monocytes Absolute: 0.6 10*3/uL (ref 0.1–0.9)
Monocytes: 10 %
Neutrophils Absolute: 3.6 10*3/uL (ref 1.4–7.0)
Neutrophils: 65 %
Platelets: 310 10*3/uL (ref 150–450)
RBC: 3.82 x10E6/uL (ref 3.77–5.28)
RDW: 12.7 % (ref 11.7–15.4)
WBC: 5.7 10*3/uL (ref 3.4–10.8)

## 2020-05-29 LAB — COMPREHENSIVE METABOLIC PANEL
ALT: 15 IU/L (ref 0–32)
AST: 16 IU/L (ref 0–40)
Albumin/Globulin Ratio: 1.6 (ref 1.2–2.2)
Albumin: 4.2 g/dL (ref 3.6–4.6)
Alkaline Phosphatase: 66 IU/L (ref 44–121)
BUN/Creatinine Ratio: 20 (ref 12–28)
BUN: 21 mg/dL (ref 8–27)
Bilirubin Total: 0.5 mg/dL (ref 0.0–1.2)
CO2: 23 mmol/L (ref 20–29)
Calcium: 9.3 mg/dL (ref 8.7–10.3)
Chloride: 102 mmol/L (ref 96–106)
Creatinine, Ser: 1.04 mg/dL — ABNORMAL HIGH (ref 0.57–1.00)
Globulin, Total: 2.6 g/dL (ref 1.5–4.5)
Glucose: 95 mg/dL (ref 65–99)
Potassium: 4.3 mmol/L (ref 3.5–5.2)
Sodium: 140 mmol/L (ref 134–144)
Total Protein: 6.8 g/dL (ref 6.0–8.5)
eGFR: 51 mL/min/{1.73_m2} — ABNORMAL LOW (ref >59)

## 2020-05-29 LAB — LIPID PANEL W/O CHOL/HDL RATIO
Cholesterol, Total: 160 mg/dL (ref 100–199)
HDL: 75 mg/dL (ref >39)
LDL Chol Calc (NIH): 74 mg/dL (ref 0–99)
Triglycerides: 51 mg/dL (ref 0–149)
VLDL Cholesterol Cal: 11 mg/dL (ref 5–40)

## 2020-05-29 LAB — TSH: TSH: 1.42 u[IU]/mL (ref 0.450–4.500)

## 2020-05-31 ENCOUNTER — Encounter: Payer: Self-pay | Admitting: Family Medicine

## 2020-07-03 DIAGNOSIS — H353131 Nonexudative age-related macular degeneration, bilateral, early dry stage: Secondary | ICD-10-CM | POA: Diagnosis not present

## 2020-07-03 DIAGNOSIS — H401131 Primary open-angle glaucoma, bilateral, mild stage: Secondary | ICD-10-CM | POA: Diagnosis not present

## 2020-07-03 DIAGNOSIS — H26493 Other secondary cataract, bilateral: Secondary | ICD-10-CM | POA: Diagnosis not present

## 2020-07-04 ENCOUNTER — Ambulatory Visit (INDEPENDENT_AMBULATORY_CARE_PROVIDER_SITE_OTHER): Payer: PPO | Admitting: General Practice

## 2020-07-04 ENCOUNTER — Telehealth: Payer: Self-pay | Admitting: General Practice

## 2020-07-04 DIAGNOSIS — E785 Hyperlipidemia, unspecified: Secondary | ICD-10-CM

## 2020-07-04 DIAGNOSIS — I10 Essential (primary) hypertension: Secondary | ICD-10-CM

## 2020-07-04 DIAGNOSIS — K5900 Constipation, unspecified: Secondary | ICD-10-CM

## 2020-07-04 NOTE — Chronic Care Management (AMB) (Signed)
Chronic Care Management   CCM RN Visit Note  07/04/2020 Name: Cassie Brewer MRN: [id number] DOB: 09/29/1928  Subjective: Cassie Brewer is a 87 y.o. year old female who is a primary care patient of Valerie Roys, DO. The care management team was consulted for assistance with disease management and care coordination needs.    Engaged with patient by telephone for follow up visit in response to provider referral for case management and/or care coordination services.   Consent to Services:  The patient was given information about Chronic Care Management services, agreed to services, and gave verbal consent prior to initiation of services.  Please see initial visit note for detailed documentation.   Patient agreed to services and verbal consent obtained.   Assessment: Review of patient past medical history, allergies, medications, health status, including review of consultants reports, laboratory and other test data, was performed as part of comprehensive evaluation and provision of chronic care management services.   SDOH (Social Determinants of Health) assessments and interventions performed:    CCM Care Plan  Allergies  Allergen Reactions  . Amoxicillin     Other reaction(s): Other (See Comments) "blood pressure very high - almost passed out"  . Eryc [Erythromycin] Other (See Comments)  . Sulfa Antibiotics Nausea And Vomiting    Nausea with bactrim in 2017  Nausea with bactrim in 2017     Outpatient Encounter Medications as of 07/04/2020  Medication Sig  . aspirin EC 81 MG EC tablet Take 1 tablet (81 mg total) by mouth daily. Swallow whole.  Marland Kitchen atorvastatin (LIPITOR) 80 MG tablet Take 1 tablet (80 mg total) by mouth daily.  . benazepril (LOTENSIN) 40 MG tablet Take 1 tablet (40 mg total) by mouth daily.  . Calcium Carbonate-Vit D-Min (CALCIUM 600+D PLUS MINERALS) 600-400 MG-UNIT TABS Take 1 tablet by mouth as directed.  . clotrimazole-betamethasone (LOTRISONE) cream Apply 1  application topically 2 (two) times daily.  . famotidine (PEPCID) 40 MG tablet TAKE 1 TABLET BY MOUTH EVERYDAY AT BEDTIME (Patient taking differently: Take 40 mg by mouth at bedtime as needed for heartburn.)  . fluticasone (FLONASE) 50 MCG/ACT nasal spray Place 2 sprays into both nostrils daily as needed for allergies. (Patient not taking: No sig reported)  . hydrochlorothiazide (HYDRODIURIL) 25 MG tablet Take 1 tablet (25 mg total) by mouth daily.  . hydrocortisone (ANUSOL-HC) 2.5 % rectal cream Place 1 application rectally 2 (two) times daily.  Marland Kitchen latanoprost (XALATAN) 0.005 % ophthalmic solution Place 1 drop into both eyes at bedtime.  Marland Kitchen levothyroxine (SYNTHROID) 112 MCG tablet Take 1 tablet (112 mcg total) by mouth daily before breakfast.  . Multiple Vitamins-Minerals (CENTRUM SILVER ULTRA WOMENS PO) Take 1 tablet by mouth daily.  . Omega-3 Fatty Acids (FISH OIL) 1000 MG CAPS Take 1,000 mg by mouth daily.  . polyethylene glycol powder (GLYCOLAX/MIRALAX) 17 GM/SCOOP powder Take 17 g by mouth 2 (two) times daily as needed.  . timolol (TIMOPTIC) 0.5 % ophthalmic solution Place 1 drop into the left eye daily.  . vitamin C (ASCORBIC ACID) 500 MG tablet Take 500 mg by mouth daily.   No facility-administered encounter medications on file as of 07/04/2020.    Patient Active Problem List   Diagnosis Date Noted  . Aphasia 03/21/2020  . Aphasia S/P CVA 03/11/2020  . TIA (transient ischemic attack) 03/10/2020  . 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 Arthropathy 04/24/2015  . Benign hypertensive renal disease 10/02/2014  . Hypothyroidism 10/02/2014  . Hyperlipidemia 10/02/2014  .  CKD (chronic kidney disease), stage III ([place]) 10/02/2014    Conditions to be addressed/monitored:HTN, HLD and constipation  Care Plan : RNCM: Hypertension (Adult)  Updates made by Vanita Ingles since 07/04/2020 12:00 AM    Problem: RNCM: Hypertension (Hypertension)   Priority: Medium    Long-Range Goal: RNCM: Hypertension  Monitored   Priority: Medium  Note:   Objective:  . Last practice recorded BP readings:  . BP Readings from Last 3 Encounters: .  05/28/20 . (!) 145/77 .  04/10/20 . (!) 143/73 .  03/25/20 . (!) 136/49 .    Marland Kitchen Most recent eGFR/CrCl: No results found for: EGFR  No components found for: CRCL Current Barriers:  Marland Kitchen Knowledge Deficits related to basic understanding of hypertension pathophysiology and self care management . Knowledge Deficits related to understanding of medications prescribed for management of hypertension . Unable to independently manage HTN . Does not contact provider office for questions/concerns Case Manager Clinical Goal(s):  Marland Kitchen Over the next 120 days, patient will verbalize understanding of plan for hypertension management . Over the next 120 days, patient will attend all scheduled medical appointments: 11-28-2020 . Over the next 120 days, patient will demonstrate improved adherence to prescribed treatment plan for hypertension as evidenced by taking all medications as prescribed, monitoring and recording blood pressure as directed, adhering to low sodium/DASH diet . Over the next 120 days, patient will demonstrate improved health management independence as evidenced by checking blood pressure as directed and notifying PCP if SBP>160 or DBP > 90, taking all medications as prescribe, and adhering to a low sodium diet as discussed. . Over the next 120 days, patient will verbalize basic understanding of hypertension disease process and self health management plan as evidenced by normalized blood pressures, managing stressors to maintain blood pressure and working with CCM team to optimize health and well being.  Interventions:  . Collaboration with Valerie Roys, DO regarding development and update of comprehensive plan of care as evidenced by provider attestation and co-signature . Inter-disciplinary care team collaboration (see longitudinal plan of care) . Evaluation of current  treatment plan related to hypertension self management and patient's adherence to plan as established by provider. 07-04-2020: The patient denies any issues with her blood pressures.  Denies any headaches. Is compliant with the plan of care.  . Provided education to patient re: stroke prevention, s/s of heart attack and stroke, DASH diet, complications of uncontrolled blood pressure . Reviewed medications with patient and discussed importance of compliance . Discussed plans with patient for ongoing care management follow up and provided patient with direct contact information for care management team . Advised patient, providing education and rationale, to monitor blood pressure daily and record, calling PCP for findings outside established parameters.  . Reviewed scheduled/upcoming provider appointments including: 11-28-2020 Patient Goals/Self-Care Activities . Over the next 120 days, patient will:  - Self administers medications as prescribed Attends all scheduled provider appointments Calls provider office for new concerns, questions, or BP outside discussed parameters Checks BP and records as discussed Follows a low sodium diet/DASH diet - blood pressure trends reviewed - depression screen reviewed - home or ambulatory blood pressure monitoring encouraged Follow Up Plan: Telephone follow up appointment with care management team member scheduled for: 09-19-2020 at 0945 am   Care Plan : RNCM: HLD Management  Updates made by Vanita Ingles since 07/04/2020 12:00 AM    Problem: RNCM: HLD   Priority: Medium    Long-Range Goal: RNCM: HLD  Priority: Medium  Note:   Current Barriers:  . Poorly controlled hyperlipidemia, complicated by HTN, constipation  . Current antihyperlipidemic regimen: atorvastatin 80 mg QD- patient is now taking only 20 mg a day- states she can not tolerate the 80 mg or even 40 mg- notifying pcp (07-04-2020) . Most recent lipid panel:  . Lab Results .  Component . Value  . Date .   Marland Kitchen CHOL . 160 . 05/28/2020 .   Marland Kitchen CHOL . 165 . 04/11/2020 .   Marland Kitchen CHOL . 225 (H) . 03/11/2020 .   Marland Kitchen Lab Results .  Component . Value . Date .   Marland Kitchen HDL . 75 . 05/28/2020 .   Marland Kitchen HDL . 74 . 04/11/2020 .   Marland Kitchen HDL . 69 . 03/11/2020 .   Marland Kitchen Lab Results .  Component . Value . Date .   Marland Kitchen LDLCALC . 74 . 05/28/2020 .   Marland Kitchen LDLCALC . 78 . 04/11/2020 .   Marland Kitchen LDLCALC . 143 (H) . 03/11/2020 .   Marland Kitchen Lab Results .  Component . Value . Date .   Marland Kitchen TRIG . 51 . 05/28/2020 .   Marland Kitchen TRIG . 67 . 04/11/2020 .   Marland Kitchen TRIG . 63 . 03/11/2020 .   Marland Kitchen Lab Results .  Component . Value . Date .   Marland Kitchen CHOLHDL . 3.3 . 03/11/2020 .   Marland Kitchen CHOLHDL . 2.7 . 10/07/2014 .   Marland Kitchen No results found for: LDLDIRECT  . ASCVD risk enhancing conditions: age >20, HTN, CKD . Unable to independently manage HLD . Does not contact provider office for questions/concerns  RN Care Manager Clinical Goal(s):  Marland Kitchen Over the next 120 days, patient will work with Medical illustrator, providers, and care team towards execution of optimized self-health management plan . Over the next 120 days, patient will verbalize understanding of plan for effective management of HLD  . Over the next 120 days, patient will work with RNCM, and pcp to address needs related to changes in chronic conditions  . Over the next 120 days, patient will attend all scheduled medical appointments: 11-28-2020  Interventions: . Collaboration with Dorcas Carrow, DO regarding development and update of comprehensive plan of care as evidenced by provider attestation and co-signature . Inter-disciplinary care team collaboration (see longitudinal plan of care) . Medication review performed; medication list updated in electronic medical record. 07-04-2020: In review of medications the patient states she took the higher dose of Lipitor 80 mg for one month and her arms and legs hurt so badly, she cut it in half to 40 mg but was still having pain and discomfort in joints. She now is taking 20 mg daily  and is tolerating that well. Wanted the RNCM to let the pcp know. She does not go back to see pcp until September but wanted the pcp to be aware of the changes she made. Ask for recommendations and call back with recommendations from the pcp.  Rene Paci care team collaboration (see longitudinal plan of care) . Referred to pharmacy team for assistance with HLD medication management-07-04-2020: Will update pharmacy on the patients stated change by the patient with Lipitor . Evaluation of current treatment plan related to HLD  and patient's adherence to plan as established by provider. 07-04-2020: The patient has changed her dose of Lipitor. The patient would like the pcp to know. Will collaborate with the pcp.  . Advised patient to call the office for questions or concerns .  Provided education to patient re: heart healthy diet, maintaining healthy weight, ways to manage bowel movements, and support given for questions related to chronic conditions  . Reviewed scheduled/upcoming provider appointments including: 11-28-2020 . Discussed plans with patient for ongoing care management follow up and provided patient with direct contact information for care management team   Patient Goals/Self-Care Activities: . Over the next 120 days, patient will:   - call for medicine refill 2 or 3 days before it runs out - call if I am sick and can't take my medicine - keep a list of all the medicines I take; vitamins and herbals too - learn to read medicine labels - use a pillbox to sort medicine - use an alarm clock or phone to remind me to take my medicine - change to whole grain breads, cereal, pasta - drink 6 to 8 glasses of water each day - eat 3 to 5 servings of fruits and vegetables each day - eat 5 or 6 small meals each day - limit fast food meals to no more than 1 per week - manage portion size - prepare main meal at home 3 to 5 days each week - read food labels for fat, fiber, carbohydrates and  portion size - be open to making changes - I can manage, know and watch for signs of a heart attack - if I have chest pain, call for help - learn about small changes that will make a big difference - learn my personal risk factors - administration or use of medication demonstrated - barriers to medication adherence identified - medication list compiled - medication list reviewed - medication-adherence assessment completed - self-management plan initiated or updated - strategies for improving adherence encouraged - understanding of current medications assessed  Follow Up Plan: Telephone follow up appointment with care management team member scheduled for: 09-19-2020 at 0945 am     Care Plan : RNCM: Constipation  Updates made by Vanita Ingles since 07/04/2020 12:00 AM    Problem: RNCM: Management of Constipation   Priority: High    Goal: RNCM: Management of Constipation   Priority: High  Note:   Current Barriers:  . Chronic Disease Management support and education needs related to chronic constipation  . Unable to independently manage constipation  . Lacks social connections . Does not contact provider office for questions/concerns  Nurse Case Manager Clinical Goal(s):  . patient will verbalize understanding of plan for effective management of constipation  . patient will work with [facility] and pcp  to address needs related to constipation concerns  . patient will attend all scheduled medical appointments: 11-28-2020  . patient will demonstrate improved adherence to prescribed treatment plan for constipation as evidenced byimproved bowel patterns.   Interventions:  . 1:1 collaboration with Valerie Roys, DO regarding development and update of comprehensive plan of care as evidenced by provider attestation and co-signature . Inter-disciplinary care team collaboration (see longitudinal plan of care) . Evaluation of current treatment plan related to constipation  and patient's  adherence to plan as established by provider. . Advised patient to call the office for changes or questions  . Provided education to patient re: eating fiber in diet, drinking plenty of water, keeping active and taking medications as directed  . Collaborated with pcp regarding patient states that the miralax bloats her sometimes. She is having soft stool but seems to be having a hard time getting it to pass. The patient ask for recommendations . Provided patient with alternative  measurers for constipation  educational materials related to difficulty with passing stool and the patient being concerned . Reviewed scheduled/upcoming provider appointments including: 11-28-2020  . Discussed plans with patient for ongoing care management follow up and provided patient with direct contact information for care management team  Patient Goals/Self-Care Activities Over the next 120 days, patient will:  - Patient will self administer medications as prescribed Patient will attend all scheduled provider appointments Patient will call pharmacy for medication refills Patient will attend church or other social activities Patient will continue to perform ADL's independently Patient will continue to perform IADL's independently Patient will call provider office for new concerns or questions Patient will work with BSW to address care coordination needs and will continue to work with the clinical team to address health care and disease management related needs.   - barriers to meeting goals identified - change-talk evoked - choices provided - collaboration with team encouraged - decision-making supported - health risks reviewed - problem-solving facilitated - questions answered - readiness for change evaluated - reassurance provided - self-reflection promoted - self-reliance encouraged - verbalization of feelings encouraged  Follow Up Plan: Telephone follow up appointment with care management team member  scheduled for: 09-19-2020 at 0945 am       Task: RNCM: Constipation Management   Note:   Care Management Activities:    - barriers to meeting goals identified - change-talk evoked - choices provided - collaboration with team encouraged - decision-making supported - health risks reviewed - problem-solving facilitated - questions answered - readiness for change evaluated - reassurance provided - self-reflection promoted - self-reliance encouraged - verbalization of feelings encouraged         Plan:Telephone follow up appointment with care management team member scheduled for:  09-19-2020 at [place] am  Noreene Larsson RN, MSN, [facility] Mobile: [phone]

## 2020-07-04 NOTE — Patient Instructions (Signed)
Visit Information  PATIENT GOALS: Patient Care Plan: RNCM: Hypertension (Adult)    Problem Identified: RNCM: Hypertension (Hypertension)   Priority: Medium    Long-Range Goal: RNCM: Hypertension Monitored   Priority: Medium  Note:   Objective:  . Last practice recorded BP readings:  . BP Readings from Last 3 Encounters: .  05/28/20 . (!) 145/77 .  04/10/20 . (!) 143/73 .  03/25/20 . (!) 136/49 .    Marland Kitchen Most recent eGFR/CrCl: No results found for: EGFR  No components found for: CRCL Current Barriers:  Marland Kitchen Knowledge Deficits related to basic understanding of hypertension pathophysiology and self care management . Knowledge Deficits related to understanding of medications prescribed for management of hypertension . Unable to independently manage HTN . Does not contact provider office for questions/concerns Case Manager Clinical Goal(s):  Marland Kitchen Over the next 120 days, patient will verbalize understanding of plan for hypertension management . Over the next 120 days, patient will attend all scheduled medical appointments: 11-28-2020 . Over the next 120 days, patient will demonstrate improved adherence to prescribed treatment plan for hypertension as evidenced by taking all medications as prescribed, monitoring and recording blood pressure as directed, adhering to low sodium/DASH diet . Over the next 120 days, patient will demonstrate improved health management independence as evidenced by checking blood pressure as directed and notifying PCP if SBP>160 or DBP > 90, taking all medications as prescribe, and adhering to a low sodium diet as discussed. . Over the next 120 days, patient will verbalize basic understanding of hypertension disease process and self health management plan as evidenced by normalized blood pressures, managing stressors to maintain blood pressure and working with CCM team to optimize health and well being.  Interventions:  . Collaboration with Valerie Roys, DO regarding  development and update of comprehensive plan of care as evidenced by provider attestation and co-signature . Inter-disciplinary care team collaboration (see longitudinal plan of care) . Evaluation of current treatment plan related to hypertension self management and patient's adherence to plan as established by provider. 07-04-2020: The patient denies any issues with her blood pressures.  Denies any headaches. Is compliant with the plan of care.  . Provided education to patient re: stroke prevention, s/s of heart attack and stroke, DASH diet, complications of uncontrolled blood pressure . Reviewed medications with patient and discussed importance of compliance . Discussed plans with patient for ongoing care management follow up and provided patient with direct contact information for care management team . Advised patient, providing education and rationale, to monitor blood pressure daily and record, calling PCP for findings outside established parameters.  . Reviewed scheduled/upcoming provider appointments including: 11-28-2020 Patient Goals/Self-Care Activities . Over the next 120 days, patient will:  - Self administers medications as prescribed Attends all scheduled provider appointments Calls provider office for new concerns, questions, or BP outside discussed parameters Checks BP and records as discussed Follows a low sodium diet/DASH diet - blood pressure trends reviewed - depression screen reviewed - home or ambulatory blood pressure monitoring encouraged Follow Up Plan: Telephone follow up appointment with care management team member scheduled for: 09-19-2020 at 0945 am   Task: RNCM: Identify and Monitor Blood Pressure Elevation   Note:   Care Management Activities:    - blood pressure trends reviewed - depression screen reviewed - home or ambulatory blood pressure monitoring encouraged       Patient Care Plan: RNCM: HLD Management    Problem Identified: RNCM: HLD   Priority:  Medium  Long-Range Goal: RNCM: HLD   Priority: Medium  Note:   Current Barriers:  . Poorly controlled hyperlipidemia, complicated by HTN, constipation  . Current antihyperlipidemic regimen: atorvastatin 80 mg QD- patient is now taking only 20 mg a day- states she can not tolerate the 80 mg or even 40 mg- notifying pcp (07-04-2020) . Most recent lipid panel:  . Lab Results .  Component . Value . Date .   Marland Kitchen CHOL . 160 . 05/28/2020 .   Marland Kitchen CHOL . 165 . 04/11/2020 .   Marland Kitchen CHOL . 225 (H) . 03/11/2020 .   Marland Kitchen Lab Results .  Component . Value . Date .   Marland Kitchen HDL . 75 . 05/28/2020 .   Marland Kitchen HDL . 74 . 04/11/2020 .   Marland Kitchen HDL . 69 . 03/11/2020 .   Marland Kitchen Lab Results .  Component . Value . Date .   Marland Kitchen LDLCALC . 74 . 05/28/2020 .   Marland Kitchen LDLCALC . 78 . 04/11/2020 .   Marland Kitchen LDLCALC . 143 (H) . 03/11/2020 .   Marland Kitchen Lab Results .  Component . Value . Date .   Marland Kitchen TRIG . 51 . 05/28/2020 .   Marland Kitchen TRIG . 67 . 04/11/2020 .   Marland Kitchen TRIG . 63 . 03/11/2020 .   Marland Kitchen Lab Results .  Component . Value . Date .   Marland Kitchen CHOLHDL . 3.3 . 03/11/2020 .   Marland Kitchen CHOLHDL . 2.7 . 10/07/2014 .   Marland Kitchen No results found for: LDLDIRECT  . ASCVD risk enhancing conditions: age >86, HTN, CKD . Unable to independently manage HLD . Does not contact provider office for questions/concerns  RN Care Manager Clinical Goal(s):  Marland Kitchen Over the next 120 days, patient will work with Medical illustrator, providers, and care team towards execution of optimized self-health management plan . Over the next 120 days, patient will verbalize understanding of plan for effective management of HLD  . Over the next 120 days, patient will work with RNCM, and pcp to address needs related to changes in chronic conditions  . Over the next 120 days, patient will attend all scheduled medical appointments: 11-28-2020  Interventions: . Collaboration with Dorcas Carrow, DO regarding development and update of comprehensive plan of care as evidenced by provider attestation and  co-signature . Inter-disciplinary care team collaboration (see longitudinal plan of care) . Medication review performed; medication list updated in electronic medical record. 07-04-2020: In review of medications the patient states she took the higher dose of Lipitor 80 mg for one month and her arms and legs hurt so badly, she cut it in half to 40 mg but was still having pain and discomfort in joints. She now is taking 20 mg daily and is tolerating that well. Wanted the RNCM to let the pcp know. She does not go back to see pcp until September but wanted the pcp to be aware of the changes she made. Ask for recommendations and call back with recommendations from the pcp.  Rene Paci care team collaboration (see longitudinal plan of care) . Referred to pharmacy team for assistance with HLD medication management-07-04-2020: Will update pharmacy on the patients stated change by the patient with Lipitor . Evaluation of current treatment plan related to HLD  and patient's adherence to plan as established by provider. 07-04-2020: The patient has changed her dose of Lipitor. The patient would like the pcp to know. Will collaborate with the pcp.  . Advised patient to call the office  for questions or concerns . Provided education to patient re: heart healthy diet, maintaining healthy weight, ways to manage bowel movements, and support given for questions related to chronic conditions  . Reviewed scheduled/upcoming provider appointments including: 11-28-2020 . Discussed plans with patient for ongoing care management follow up and provided patient with direct contact information for care management team   Patient Goals/Self-Care Activities: . Over the next 120 days, patient will:   - call for medicine refill 2 or 3 days before it runs out - call if I am sick and can't take my medicine - keep a list of all the medicines I take; vitamins and herbals too - learn to read medicine labels - use a pillbox to sort  medicine - use an alarm clock or phone to remind me to take my medicine - change to whole grain breads, cereal, pasta - drink 6 to 8 glasses of water each day - eat 3 to 5 servings of fruits and vegetables each day - eat 5 or 6 small meals each day - limit fast food meals to no more than 1 per week - manage portion size - prepare main meal at home 3 to 5 days each week - read food labels for fat, fiber, carbohydrates and portion size - be open to making changes - I can manage, know and watch for signs of a heart attack - if I have chest pain, call for help - learn about small changes that will make a big difference - learn my personal risk factors - administration or use of medication demonstrated - barriers to medication adherence identified - medication list compiled - medication list reviewed - medication-adherence assessment completed - self-management plan initiated or updated - strategies for improving adherence encouraged - understanding of current medications assessed  Follow Up Plan: Telephone follow up appointment with care management team member scheduled for: 09-19-2020 at 0945 am     Task: RNCM: HLD management   Note:   Care Management Activities:    - administration or use of medication demonstrated - barriers to medication adherence identified - medication list compiled - medication list reviewed - medication-adherence assessment completed - self-management plan initiated or updated - strategies for improving adherence encouraged - understanding of current medications assessed       Patient Care Plan: RNCM: Constipation    Problem Identified: RNCM: Management of Constipation   Priority: High    Goal: RNCM: Management of Constipation   Priority: High  Note:   Current Barriers:  . Chronic Disease Management support and education needs related to chronic constipation  . Unable to independently manage constipation  . Lacks social connections . Does not  contact provider office for questions/concerns  Nurse Case Manager Clinical Goal(s):  . patient will verbalize understanding of plan for effective management of constipation  . patient will work with [facility] and pcp  to address needs related to constipation concerns  . patient will attend all scheduled medical appointments: 11-28-2020  . patient will demonstrate improved adherence to prescribed treatment plan for constipation as evidenced byimproved bowel patterns.   Interventions:  . 1:1 collaboration with Valerie Roys, DO regarding development and update of comprehensive plan of care as evidenced by provider attestation and co-signature . Inter-disciplinary care team collaboration (see longitudinal plan of care) . Evaluation of current treatment plan related to constipation  and patient's adherence to plan as established by provider. . Advised patient to call the office for changes or questions  . Provided education  to patient re: eating fiber in diet, drinking plenty of water, keeping active and taking medications as directed  . Collaborated with pcp regarding patient states that the miralax bloats her sometimes. She is having soft stool but seems to be having a hard time getting it to pass. The patient ask for recommendations . Provided patient with alternative measurers for constipation  educational materials related to difficulty with passing stool and the patient being concerned . Reviewed scheduled/upcoming provider appointments including: 11-28-2020  . Discussed plans with patient for ongoing care management follow up and provided patient with direct contact information for care management team  Patient Goals/Self-Care Activities Over the next 120 days, patient will:  - Patient will self administer medications as prescribed Patient will attend all scheduled provider appointments Patient will call pharmacy for medication refills Patient will attend church or other social  activities Patient will continue to perform ADL's independently Patient will continue to perform IADL's independently Patient will call provider office for new concerns or questions Patient will work with BSW to address care coordination needs and will continue to work with the clinical team to address health care and disease management related needs.   - barriers to meeting goals identified - change-talk evoked - choices provided - collaboration with team encouraged - decision-making supported - health risks reviewed - problem-solving facilitated - questions answered - readiness for change evaluated - reassurance provided - self-reflection promoted - self-reliance encouraged - verbalization of feelings encouraged  Follow Up Plan: Telephone follow up appointment with care management team member scheduled for: 09-19-2020 at 0945 am       Task: RNCM: Constipation Management   Note:   Care Management Activities:    - barriers to meeting goals identified - change-talk evoked - choices provided - collaboration with team encouraged - decision-making supported - health risks reviewed - problem-solving facilitated - questions answered - readiness for change evaluated - reassurance provided - self-reflection promoted - self-reliance encouraged - verbalization of feelings encouraged         Patient verbalizes understanding of instructions provided today and agrees to view in [place].   Telephone follow up appointment with care management team member scheduled for: 09-19-2020 at [place] am  Noreene Larsson RN, MSN, [facility] Mobile: [phone]

## 2020-07-18 ENCOUNTER — Telehealth: Payer: Self-pay

## 2020-07-18 MED ORDER — ATORVASTATIN CALCIUM 20 MG PO TABS: 20.0000 mg | ORAL_TABLET | Freq: Every day | ORAL | 1 refills | Status: DC

## 2020-07-18 NOTE — Telephone Encounter (Signed)
That is an old Rx. The new one should be at the pharmacy

## 2020-07-18 NOTE — Telephone Encounter (Signed)
Called pt to schedule an appt. Pt states that at her appt in march she spoke with Dr Wynetta Emery about this as it was a suggestion from her neurologist as well. She wanted to check because the pharmacy refilled rx and still have it at 80 mg. Please advise  Copied from [place] [phone]. Topic: General - Other >> Jul 18, 2020 10:35 AM Tessa Lerner A wrote: Reason for CRM: Patient would like to speak with a member of clinical staff about medication management  Patient is currently prescribed atorvastatin (LIPITOR) 80 MG tablets.  Patient's arms started hurting in early March and patient began taking half of their tablet  The pain continued and patient began taking only 1/4 of their 80 MG tablets.   Patient spoke with "Nurse Pam" on 06/03/20 and requested for their dosage to be reduced but patient shares that they don't feel able to tolerate the dosage prescribed any further   Please contact to further advise when possible

## 2020-07-21 NOTE — Telephone Encounter (Signed)
Patient notified

## 2020-08-07 ENCOUNTER — Ambulatory Visit: Payer: PPO | Admitting: Podiatry

## 2020-08-14 ENCOUNTER — Encounter: Payer: Self-pay | Admitting: Podiatry

## 2020-08-14 ENCOUNTER — Other Ambulatory Visit: Payer: Self-pay

## 2020-08-14 ENCOUNTER — Ambulatory Visit (INDEPENDENT_AMBULATORY_CARE_PROVIDER_SITE_OTHER): Payer: PPO | Admitting: Podiatry

## 2020-08-14 DIAGNOSIS — B351 Tinea unguium: Secondary | ICD-10-CM

## 2020-08-14 DIAGNOSIS — M79674 Pain in right toe(s): Secondary | ICD-10-CM | POA: Diagnosis not present

## 2020-08-14 DIAGNOSIS — M79675 Pain in left toe(s): Secondary | ICD-10-CM

## 2020-08-20 ENCOUNTER — Encounter: Payer: Self-pay | Admitting: Podiatry

## 2020-08-20 NOTE — Progress Notes (Signed)
  Subjective:  Patient ID: Cassie Brewer, female    DOB: [birth year],  MRN: [id number]  Chief Complaint  Patient presents with  . Nail Problem    Nail trim RFC   "My bunion on my right foot has been bothering lately"   87 y.o. female returns for the above complaint.  Patient presents with thickened elongated mycotic toenails x10.  Patient is especially concerned for right hallux onychomycosis.  Patient not able to debride on herself.  She would like for [place] to debride down.  She does not have any secondary complaints. Objective:   There were no vitals filed for this visit. Podiatric Exam: Vascular: dorsalis pedis and posterior tibial pulses are palpable bilateral. Capillary return is immediate. Temperature gradient is WNL. Skin turgor WNL  Sensorium: Normal Semmes Weinstein monofilament test. Normal tactile sensation bilaterally.  However she experiences subjective neuropathic pain to the distal tip of the toes.  Intact sensation Nail Exam: Pt has thick disfigured discolored nails with subungual debris noted bilateral entire nail hallux through fifth toenails.  Ingrown noted to the right hallux lateral border.  Mild pain on palpation. Ulcer Exam: There is no evidence of ulcer or pre-ulcerative changes or infection. Orthopedic Exam: Muscle tone and strength are WNL. No limitations in general ROM. No crepitus or effusions noted. HAV  B/L.  Hammer toes 2-5  B/L. Skin: No Porokeratosis. No infection or ulcers.  Xerosis/psoriasis of the plantar foot with now subjective component of itching..  There is mild cracking noted.  No bleeding noted.  Assessment & Plan:  Patient was evaluated and treated and all questions answered.  Athlete's foot bilateral -Explained to patient the etiology of athlete's foot and various treatment options were discussed.  Given that there is subjective complaint of itching associated with the bottom of the foot I believe she will benefit from Lotrisone cream.  I have asked  her to apply twice a day.  She states understanding.  Neuropathic pain -I explained to the patient the etiology of neuropathic pain and various treatment options were extensively discussed.  I believe she will benefit from neuropathic cream to help decrease the pain associated with this -Prescription for neuropathic cream was [facility].  Xerosis/psoriasis -I explained to the patient the etiology of dryness with cracking as well as various treatment options associated with it.  Given that she is doing good with clotrimazole cream that was given to her by dermatologist and had diagnosed her with psoriasis, I believe she can continue using the cream.  Patient agrees with this plan.  Onychomycosis with pain  -Nails palliatively debrided as below. -Educated on self-care  Procedure: Nail Debridement Rationale: pain  Type of Debridement: manual, sharp debridement. Instrumentation: Nail nipper, rotary burr. Number of Nails: 10  Procedures and Treatment: Consent by patient was obtained for treatment procedures. The patient understood the discussion of treatment and procedures well. All questions were answered thoroughly reviewed. Debridement of mycotic and hypertrophic toenails, 1 through 5 bilateral and clearing of subungual debris. No ulceration, no infection noted.  Return Visit-Office Procedure: Patient instructed to return to the office for a follow up visit 3 months for continued evaluation and treatment.  Boneta Lucks, DPM    No follow-ups on file.

## 2020-09-08 DIAGNOSIS — M25472 Effusion, left ankle: Secondary | ICD-10-CM | POA: Diagnosis not present

## 2020-09-08 DIAGNOSIS — I779 Disorder of arteries and arterioles, unspecified: Secondary | ICD-10-CM | POA: Diagnosis not present

## 2020-09-08 DIAGNOSIS — R0600 Dyspnea, unspecified: Secondary | ICD-10-CM | POA: Diagnosis not present

## 2020-09-08 DIAGNOSIS — E079 Disorder of thyroid, unspecified: Secondary | ICD-10-CM | POA: Diagnosis not present

## 2020-09-08 DIAGNOSIS — N1831 Chronic kidney disease, stage 3a: Secondary | ICD-10-CM | POA: Diagnosis not present

## 2020-09-08 DIAGNOSIS — M25471 Effusion, right ankle: Secondary | ICD-10-CM | POA: Diagnosis not present

## 2020-09-08 DIAGNOSIS — I499 Cardiac arrhythmia, unspecified: Secondary | ICD-10-CM | POA: Diagnosis not present

## 2020-09-08 DIAGNOSIS — I639 Cerebral infarction, unspecified: Secondary | ICD-10-CM | POA: Diagnosis not present

## 2020-09-08 DIAGNOSIS — E782 Mixed hyperlipidemia: Secondary | ICD-10-CM | POA: Diagnosis not present

## 2020-09-08 DIAGNOSIS — I10 Essential (primary) hypertension: Secondary | ICD-10-CM | POA: Diagnosis not present

## 2020-09-19 ENCOUNTER — Telehealth: Payer: Self-pay | Admitting: General Practice

## 2020-09-19 ENCOUNTER — Ambulatory Visit (INDEPENDENT_AMBULATORY_CARE_PROVIDER_SITE_OTHER): Payer: PPO | Admitting: General Practice

## 2020-09-19 DIAGNOSIS — I69320 Aphasia following cerebral infarction: Secondary | ICD-10-CM

## 2020-09-19 DIAGNOSIS — E785 Hyperlipidemia, unspecified: Secondary | ICD-10-CM

## 2020-09-19 DIAGNOSIS — K5900 Constipation, unspecified: Secondary | ICD-10-CM

## 2020-09-19 DIAGNOSIS — I10 Essential (primary) hypertension: Secondary | ICD-10-CM

## 2020-09-19 NOTE — Telephone Encounter (Signed)
  Chronic Care Management   Note  09/19/2020 Name: Cassie Brewer MRN: [id number] DOB: 1928/05/04  The patient returned call and visit was completed. See new encounter.  Follow up plan: Telephone follow up appointment with care management team member scheduled for:11-21-2020 at [place] am  Noreene Larsson RN, MSN, [facility] Mobile: [phone]

## 2020-09-19 NOTE — Chronic Care Management (AMB) (Signed)
Chronic Care Management   CCM RN Visit Note  09/19/2020 Name: Cassie Brewer MRN: [id number] DOB: 05-Aug-1928  Subjective: Cassie Brewer is a 87 y.o. year old female who is a primary care patient of Cassie Carrow, DO. The care management team was consulted for assistance with disease management and care coordination needs.    Engaged with patient by telephone for follow up visit in response to provider referral for case management and/or care coordination services.   Consent to Services:  The patient was given information about Chronic Care Management services, agreed to services, and gave verbal consent prior to initiation of services.  Please see initial visit note for detailed documentation.   Patient agreed to services and verbal consent obtained.   Assessment: Review of patient past medical history, allergies, medications, health status, including review of consultants reports, laboratory and other test data, was performed as part of comprehensive evaluation and provision of chronic care management services.   SDOH (Social Determinants of Health) assessments and interventions performed:    CCM Care Plan  Allergies  Allergen Reactions   Amoxicillin     Other reaction(s): Other (See Comments) "blood pressure very high - almost passed out"   Eryc [Erythromycin] Other (See Comments)   Sulfa Antibiotics Nausea And Vomiting    Nausea with bactrim in 2017  Nausea with bactrim in 2017     Outpatient Encounter Medications as of 09/19/2020  Medication Sig   aspirin EC 81 MG EC tablet Take 1 tablet (81 mg total) by mouth daily. Swallow whole.   atorvastatin (LIPITOR) 20 MG tablet Take 1 tablet (20 mg total) by mouth daily.   benazepril (LOTENSIN) 40 MG tablet Take 1 tablet (40 mg total) by mouth daily.   Calcium Carbonate-Vit D-Min (CALCIUM 600+D PLUS MINERALS) 600-400 MG-UNIT TABS Take 1 tablet by mouth as directed.   clotrimazole-betamethasone (LOTRISONE) cream Apply 1  application topically 2 (two) times daily.   famotidine (PEPCID) 40 MG tablet TAKE 1 TABLET BY MOUTH EVERYDAY AT BEDTIME (Patient taking differently: Take 40 mg by mouth at bedtime as needed for heartburn.)   fluticasone (FLONASE) 50 MCG/ACT nasal spray Place 2 sprays into both nostrils daily as needed for allergies. (Patient not taking: No sig reported)   hydrochlorothiazide (HYDRODIURIL) 25 MG tablet Take 1 tablet (25 mg total) by mouth daily.   hydrocortisone (ANUSOL-HC) 2.5 % rectal cream Place 1 application rectally 2 (two) times daily.   latanoprost (XALATAN) 0.005 % ophthalmic solution Place 1 drop into both eyes at bedtime.   levothyroxine (SYNTHROID) 112 MCG tablet Take 1 tablet (112 mcg total) by mouth daily before breakfast.   Multiple Vitamins-Minerals (CENTRUM SILVER ULTRA WOMENS PO) Take 1 tablet by mouth daily.   Omega-3 Fatty Acids (FISH OIL) 1000 MG CAPS Take 1,000 mg by mouth daily.   polyethylene glycol powder (GLYCOLAX/MIRALAX) 17 GM/SCOOP powder Take 17 g by mouth 2 (two) times daily as needed.   timolol (TIMOPTIC) 0.5 % ophthalmic solution Place 1 drop into the left eye daily.   vitamin C (ASCORBIC ACID) 500 MG tablet Take 500 mg by mouth daily.   No facility-administered encounter medications on file as of 09/19/2020.    Patient Active Problem List   Diagnosis Date Noted   Aphasia 03/21/2020   Aphasia S/P CVA 03/11/2020   TIA (transient ischemic attack) 03/10/2020   Arthritis of foot 04/24/2015   Arthropathy 04/24/2015   Benign hypertensive renal disease 10/02/2014   Hypothyroidism 10/02/2014   Hyperlipidemia 10/02/2014  CKD (chronic kidney disease), stage III ([place]) 10/02/2014    Conditions to be addressed/monitored:HTN, HLD, and history of stroke, and constipation  Care Plan : RNCM: Hypertension (Adult)  Updates made by Vanita Ingles since 09/19/2020 12:00 AM     Problem: RNCM: Hypertension (Hypertension)   Priority: Medium     Long-Range Goal: RNCM:  Hypertension Monitored   Start Date: 05/02/2020  Expected End Date: 05/17/2021  This Visit's Progress: On track  Priority: Medium  Note:   Objective:  Last practice recorded BP readings:  BP Readings from Last 3 Encounters:  05/28/20 (!) 145/77  04/10/20 (!) 143/73  03/25/20 (!) 136/49   Most recent eGFR/CrCl:  Lab Results  Component Value Date   EGFR 51 (L) 05/28/2020    No components found for: CRCL Current Barriers:  Knowledge Deficits related to basic understanding of hypertension pathophysiology and self care management Knowledge Deficits related to understanding of medications prescribed for management of hypertension Limited Social Support Unable to independently manage HTN Lives alone, adult children in [place] and [place]):  patient will verbalize understanding of plan for hypertension management patient will attend all scheduled medical appointments: 11-28-2020  patient will demonstrate improved adherence to prescribed treatment plan for hypertension as evidenced by taking all medications as prescribed, monitoring and recording blood pressure as directed, adhering to low sodium/DASH diet patient will demonstrate improved health management independence as evidenced by checking blood pressure as directed and notifying PCP if SBP>150 or DBP > 90, taking all medications as prescribe, and adhering to a low sodium diet as discussed. patient will verbalize basic understanding of hypertension disease process and self health management plan as evidenced by compliance with heart healthy diet, compliance with medications and working with the CCM team to optimize health and well being.  Interventions:  Collaboration with Valerie Roys, DO regarding development and update of comprehensive plan of care as evidenced by provider attestation and co-signature Inter-disciplinary care team collaboration (see longitudinal plan of care) Evaluation of current  treatment plan related to hypertension self management and patient's adherence to plan as established by provider. Provided education to patient re: stroke prevention, s/s of heart attack and stroke, DASH diet, complications of uncontrolled blood pressure. Sees neurologist again in August. Feels she is doing very well since having her CVA. Is back to her normal activities. Does have some issues with swelling but saw card on 09-08-2020 and is taking her medications as prescribed and also she is using compression stockings to help with fluid. She also endorses elevation of feet and legs when sitting. This helps her a lot.  Reviewed medications with patient and discussed importance of compliance. Confirms compliance with medications.  Discussed plans with patient for ongoing care management follow up and provided patient with direct contact information for care management team Advised patient, providing education and rationale, to monitor blood pressure daily and record, calling PCP for findings outside established parameters.  Reviewed scheduled/upcoming provider appointments including: 11-28-2020 Self-Care Activities: - Self administers medications as prescribed Attends all scheduled provider appointments Calls provider office for new concerns, questions, or BP outside discussed parameters Checks BP and records as discussed Follows a low sodium diet/DASH diet Patient Goals: - check blood pressure weekly - choose a place to take my blood pressure (home, clinic or office, retail store) - write blood pressure results in a log or diary - agree on reward when goals are met - agree to work together to make changes - ask  questions to understand - have a family meeting to talk about healthy habits - learn about high blood pressure  Follow Up Plan: Telephone follow up appointment with care management team member scheduled for: 11-21-2020 at 0945 am     Task: RNCM: Identify and Monitor Blood Pressure  Elevation Completed 09/19/2020  Outcome: Positive  Note:   Care Management Activities:    - blood pressure trends reviewed - depression screen reviewed - home or ambulatory blood pressure monitoring encouraged        Care Plan : RNCM: HLD Management  Updates made by Vanita Ingles since 09/19/2020 12:00 AM     Problem: RNCM: HLD   Priority: Medium     Long-Range Goal: RNCM: HLD   Priority: Medium  Note:   Current Barriers:  Poorly controlled hyperlipidemia, complicated by HTN, past CVA Current antihyperlipidemic regimen: Lipitor 20 mg daily, could not tolerate the $RemoveBefo'40mg'hrTTwtlzIFd$  or the $Remo'80mg'WUEXU$  due to muscle pain Most recent lipid panel:     Component Value Date/Time   CHOL 160 05/28/2020 0934   CHOL 211 (H) 05/05/2016 0926   TRIG 51 05/28/2020 0934   TRIG 77 05/05/2016 0926   HDL 75 05/28/2020 0934   CHOLHDL 3.3 03/11/2020 0612   VLDL 13 03/11/2020 0612   VLDL 15 05/05/2016 0926   LDLCALC 74 05/28/2020 0934   ASCVD risk enhancing conditions: age >82,  HTN, past CVA Unable to independently HLD Unable to self administer medications as prescribed- resolved as the patient is tolerating lower dose of Lipitor 20 mg without difficulty Lacks Microbiologist Clinical Goal(s):  patient will work with Consulting civil engineer, providers, and care team towards execution of optimized self-health management plan patient will verbalize understanding of plan for effective management of HLD patient will work with RNCM, pcp and specialist  to address needs related to effective management of HLD patient will take all medications exactly as prescribed and will call provider for medication related questions patient will attend all scheduled medical appointments: 11-28-2020, sees neurologist again in August  on the 25th and the cardiologist in 6 months.  the patient will demonstrate ongoing self health care management ability Interventions: Collaboration with Valerie Roys, DO regarding  development and update of comprehensive plan of care as evidenced by provider attestation and co-signature Inter-disciplinary care team collaboration (see longitudinal plan of care) Medication review performed; medication list updated in electronic medical record.  Inter-disciplinary care team collaboration (see longitudinal plan of care) Referred to pharmacy team for assistance with HLD medication management Evaluation of current treatment plan related to HLD and patient's adherence to plan as established by provider. Advised patient to call the office for changes in condition, questions or concerns Provided education to patient re: heart healthy diet, exercise, and managing chronic conditions Reviewed medications with patient and discussed states she is compliant with medications Reviewed scheduled/upcoming provider appointments including: 11-28-2020 Discussed plans with patient for ongoing care management follow up and provided patient with direct contact information for care management team Patient Goals/Self-Care Activities: - call for medicine refill 2 or 3 days before it runs out - call if I am sick and can't take my medicine - keep a list of all the medicines I take; vitamins and herbals too - learn to read medicine labels - use a pillbox to sort medicine - use an alarm clock or phone to remind me to take my medicine - change to whole grain breads, cereal, pasta - drink 6 to 8 glasses  of water each day - eat 3 to 5 servings of fruits and vegetables each day - eat 5 or 6 small meals each day - eat fish at least once per week - fill half the plate with nonstarchy vegetables - limit fast food meals to no more than 1 per week - manage portion size - prepare main meal at home 3 to 5 days each week - read food labels for fat, fiber, carbohydrates and portion size - reduce red meat to 2 to 3 times a week - be open to making changes - I can manage, know and watch for signs of a heart  attack - if I have chest pain, call for help - learn about small changes that will make a big difference - learn my personal risk factors  Follow Up Plan: Telephone follow up appointment with care management team member scheduled for: 11-21-2020 at 0945 am        Task: RNCM: HLD management Completed 09/19/2020  Outcome: Positive  Note:   Care Management Activities:    - administration or use of medication demonstrated - barriers to medication adherence identified - medication list compiled - medication list reviewed - medication-adherence assessment completed - self-management plan initiated or updated - strategies for improving adherence encouraged - understanding of current medications assessed        Care Plan : RNCM: Constipation  Updates made by Vanita Ingles since 09/19/2020 12:00 AM     Problem: RNCM: Management of Constipation   Priority: High     Goal: RNCM: Management of Constipation   Priority: High  Note:   Current Barriers:  Chronic Disease Management support and education needs related to chronic constipation  Unable to independently manage constipation  Lacks social connections Does not contact provider office for questions/concerns  Nurse Case Manager Clinical Goal(s):  patient will verbalize understanding of plan for effective management of constipation  patient will work with [facility] and pcp  to address needs related to constipation concerns  patient will attend all scheduled medical appointments: 11-28-2020  patient will demonstrate improved adherence to prescribed treatment plan for constipation as evidenced byimproved bowel patterns.   Interventions:  1:1 collaboration with Valerie Roys, DO regarding development and update of comprehensive plan of care as evidenced by provider attestation and co-signature Inter-disciplinary care team collaboration (see longitudinal plan of care) Evaluation of current treatment plan related to constipation  and  patient's adherence to plan as established by provider. 09-19-2020: The patient is doing better with constipation.  States still having some. Likely close the goal at next outreach.  Advised patient to call the office for changes or questions  Provided education to patient re: eating fiber in diet, drinking plenty of water, keeping active and taking medications as directed  Collaborated with pcp regarding patient states that the miralax bloats her sometimes. She is having soft stool but seems to be having a hard time getting it to pass. The patient ask for recommendations Provided patient with alternative measurers for constipation  educational materials related to difficulty with passing stool and the patient being concerned Reviewed scheduled/upcoming provider appointments including: 11-28-2020  Discussed plans with patient for ongoing care management follow up and provided patient with direct contact information for care management team  Patient Goals/Self-Care Activities Over the next 120 days, patient will:  - Patient will self administer medications as prescribed Patient will attend all scheduled provider appointments Patient will call pharmacy for medication refills Patient will attend church or other  social activities Patient will continue to perform ADL's independently Patient will continue to perform IADL's independently Patient will call provider office for new concerns or questions Patient will work with BSW to address care coordination needs and will continue to work with the clinical team to address health care and disease management related needs.   - barriers to meeting goals identified - change-talk evoked - choices provided - collaboration with team encouraged - decision-making supported - health risks reviewed - problem-solving facilitated - questions answered - readiness for change evaluated - reassurance provided - self-reflection promoted - self-reliance encouraged -  verbalization of feelings encouraged  Follow Up Plan: Telephone follow up appointment with care management team member scheduled for: 11-21-2020 at 0945 am        Task: RNCM: Constipation Management Completed 09/19/2020  Note:   Care Management Activities:    - barriers to meeting goals identified - change-talk evoked - choices provided - collaboration with team encouraged - decision-making supported - health risks reviewed - problem-solving facilitated - questions answered - readiness for change evaluated - reassurance provided - self-reflection promoted - self-reliance encouraged - verbalization of feelings encouraged         Plan:Telephone follow up appointment with care management team member scheduled for:  11-21-2020 at [place] am   Noreene Larsson RN, MSN, [facility] Mobile: [phone]

## 2020-09-19 NOTE — Patient Instructions (Signed)
Visit Information  PATIENT GOALS:  Goals Addressed             This Visit's Progress    RNCM: Keep or Improve My Strength-Stroke       Timeframe:  Short-Term Goal Priority:  Medium Start Date:     09-19-2020                        Expected End Date:    03-27-2021                   Follow Up Date 11/21/2020    - eat healthy to increase strength - increase activity or exercise time a little every week - know who to call for help if I fall - learn how to get up if I fall - plan activity for times when energy is the highest    Why is this important?   Before the stroke you probably did not think much about being safe when you are up and about.  Now, it may be harder for you to get around.  It may also be easier for you to trip or fall.  It is common to have muscle weakness after a stroke. You may also feel like you cannot control an arm or leg.  It will be helpful to work with a physical therapist to get your strength and muscle control back.  It is good to stay as active as you can. Walking and stretching help you stay strong and flexible.  The physical therapist will develop an exercise program just for you.     Notes: The patient is doing very well post stroke. Will see Dr. Melrose Nakayama in August and will likely not have to go back to see Dr. Melrose Nakayama anymore. Denies any issues with activity level. Is enjoying working out in her garden and doing her normal activities.       RNCM: Lifestyle Change-Hypertension       Timeframe:  Long-Range Goal Priority:  Medium Start Date:      09-19-2020                       Expected End Date:         09-19-2021              Follow Up Date 11/21/2020    - agree on reward when goals are met - agree to work together to make changes - ask questions to understand - have a family meeting to talk about healthy habits    Why is this important?   The changes that you are asked to make may be hard to do.  This is especially true when the changes are  life-long.  Knowing why it is important to you is the first step.  Working on the change with your family or support person helps you not feel alone.  Reward yourself and family or support person when goals are met. This can be an activity you choose like bowling, hiking, biking, swimming or shooting hoops.     Notes: Patient feels she is managing her care well. Her daughter is here from [place] and will be taking the patient on a trip to [place] to see her son next Tuesday, then she will travel to [place] to see her grandchildren and great grandchildren. She will return home on July the 9th. She is excited about her upcoming trip and will be celebrating her 89th birthday  at the end of July. She feels blessed.          Patient verbalizes understanding of instructions provided today and agrees to view in [place].   Telephone follow up appointment with care management team member scheduled for:11-21-2020 at [place] am  Dixie Inn, MSN, [facility] Mobile: [phone]

## 2020-11-13 DIAGNOSIS — L249 Irritant contact dermatitis, unspecified cause: Secondary | ICD-10-CM | POA: Diagnosis not present

## 2020-11-20 DIAGNOSIS — R4781 Slurred speech: Secondary | ICD-10-CM | POA: Diagnosis not present

## 2020-11-20 DIAGNOSIS — Z8673 Personal history of transient ischemic attack (TIA), and cerebral infarction without residual deficits: Secondary | ICD-10-CM | POA: Diagnosis not present

## 2020-11-20 DIAGNOSIS — R4701 Aphasia: Secondary | ICD-10-CM | POA: Diagnosis not present

## 2020-11-21 ENCOUNTER — Ambulatory Visit (INDEPENDENT_AMBULATORY_CARE_PROVIDER_SITE_OTHER): Payer: PPO

## 2020-11-21 ENCOUNTER — Telehealth: Payer: PPO | Admitting: General Practice

## 2020-11-21 DIAGNOSIS — L409 Psoriasis, unspecified: Secondary | ICD-10-CM

## 2020-11-21 DIAGNOSIS — E785 Hyperlipidemia, unspecified: Secondary | ICD-10-CM | POA: Diagnosis not present

## 2020-11-21 DIAGNOSIS — I10 Essential (primary) hypertension: Secondary | ICD-10-CM | POA: Diagnosis not present

## 2020-11-21 DIAGNOSIS — I69320 Aphasia following cerebral infarction: Secondary | ICD-10-CM

## 2020-11-21 DIAGNOSIS — K5900 Constipation, unspecified: Secondary | ICD-10-CM

## 2020-11-21 NOTE — Chronic Care Management (AMB) (Signed)
Chronic Care Management   CCM RN Visit Note  11/21/2020 Name: Cassie Brewer MRN: [id number] DOB: Nov 14, 1928  Subjective: Cassie Brewer is a 87 y.o. year old female who is a primary care patient of Dorcas Carrow, DO. The care management team was consulted for assistance with disease management and care coordination needs.    Engaged with patient by telephone for follow up visit in response to provider referral for case management and/or care coordination services.   Consent to Services:  The patient was given information about Chronic Care Management services, agreed to services, and gave verbal consent prior to initiation of services.  Please see initial visit note for detailed documentation.   Patient agreed to services and verbal consent obtained.   Assessment: Review of patient past medical history, allergies, medications, health status, including review of consultants reports, laboratory and other test data, was performed as part of comprehensive evaluation and provision of chronic care management services.   SDOH (Social Determinants of Health) assessments and interventions performed:    CCM Care Plan  Allergies  Allergen Reactions   Amoxicillin     Other reaction(s): Other (See Comments) "blood pressure very high - almost passed out"   Eryc [Erythromycin] Other (See Comments)   Sulfa Antibiotics Nausea And Vomiting    Nausea with bactrim in 2017  Nausea with bactrim in 2017     Outpatient Encounter Medications as of 11/21/2020  Medication Sig   aspirin EC 81 MG EC tablet Take 1 tablet (81 mg total) by mouth daily. Swallow whole.   atorvastatin (LIPITOR) 20 MG tablet Take 1 tablet (20 mg total) by mouth daily.   benazepril (LOTENSIN) 40 MG tablet Take 1 tablet (40 mg total) by mouth daily.   Calcium Carbonate-Vit D-Min (CALCIUM 600+D PLUS MINERALS) 600-400 MG-UNIT TABS Take 1 tablet by mouth as directed.   clotrimazole-betamethasone (LOTRISONE) cream Apply 1  application topically 2 (two) times daily.   famotidine (PEPCID) 40 MG tablet TAKE 1 TABLET BY MOUTH EVERYDAY AT BEDTIME (Patient taking differently: Take 40 mg by mouth at bedtime as needed for heartburn.)   fluticasone (FLONASE) 50 MCG/ACT nasal spray Place 2 sprays into both nostrils daily as needed for allergies. (Patient not taking: No sig reported)   hydrochlorothiazide (HYDRODIURIL) 25 MG tablet Take 1 tablet (25 mg total) by mouth daily.   hydrocortisone (ANUSOL-HC) 2.5 % rectal cream Place 1 application rectally 2 (two) times daily.   latanoprost (XALATAN) 0.005 % ophthalmic solution Place 1 drop into both eyes at bedtime.   levothyroxine (SYNTHROID) 112 MCG tablet Take 1 tablet (112 mcg total) by mouth daily before breakfast.   Multiple Vitamins-Minerals (CENTRUM SILVER ULTRA WOMENS PO) Take 1 tablet by mouth daily.   Omega-3 Fatty Acids (FISH OIL) 1000 MG CAPS Take 1,000 mg by mouth daily.   polyethylene glycol powder (GLYCOLAX/MIRALAX) 17 GM/SCOOP powder Take 17 g by mouth 2 (two) times daily as needed.   timolol (TIMOPTIC) 0.5 % ophthalmic solution Place 1 drop into the left eye daily.   vitamin C (ASCORBIC ACID) 500 MG tablet Take 500 mg by mouth daily.   No facility-administered encounter medications on file as of 11/21/2020.    Patient Active Problem List   Diagnosis Date Noted   Aphasia 03/21/2020   Aphasia S/P CVA 03/11/2020   TIA (transient ischemic attack) 03/10/2020   Arthritis of foot 04/24/2015   Arthropathy 04/24/2015   Benign hypertensive renal disease 10/02/2014   Hypothyroidism 10/02/2014   Hyperlipidemia 10/02/2014  CKD (chronic kidney disease), stage III ([place]) 10/02/2014    Conditions to be addressed/monitored:HTN, HLD, and constipation, history of stroke, and psoriasis   Care Plan : RNCM: Hypertension (Adult)  Updates made by Vanita Ingles, RN since 11/21/2020 12:00 AM     Problem: RNCM: Hypertension (Hypertension)   Priority: Medium     Long-Range  Goal: RNCM: Hypertension Monitored   Start Date: 05/02/2020  Expected End Date: 05/17/2021  This Visit's Progress: On track  Recent Progress: On track  Priority: Medium  Note:   Objective:  Last practice recorded BP readings:  BP Readings from Last 3 Encounters:  05/28/20 (!) 145/77  04/10/20 (!) 143/73  03/25/20 (!) 136/49   Most recent eGFR/CrCl:  Lab Results  Component Value Date   EGFR 51 (L) 05/28/2020    No components found for: CRCL Current Barriers:  Knowledge Deficits related to basic understanding of hypertension pathophysiology and self care management Knowledge Deficits related to understanding of medications prescribed for management of hypertension Limited Social Support Unable to independently manage HTN Lives alone, adult children in [place] and [place]):  patient will verbalize understanding of plan for hypertension management patient will attend all scheduled medical appointments: 11-28-2020  patient will demonstrate improved adherence to prescribed treatment plan for hypertension as evidenced by taking all medications as prescribed, monitoring and recording blood pressure as directed, adhering to low sodium/DASH diet patient will demonstrate improved health management independence as evidenced by checking blood pressure as directed and notifying PCP if SBP>150 or DBP > 90, taking all medications as prescribe, and adhering to a low sodium diet as discussed. patient will verbalize basic understanding of hypertension disease process and self health management plan as evidenced by compliance with heart healthy diet, compliance with medications and working with the CCM team to optimize health and well being.  Interventions:  Collaboration with Valerie Roys, DO regarding development and update of comprehensive plan of care as evidenced by provider attestation and co-signature Inter-disciplinary care team collaboration (see longitudinal  plan of care) Evaluation of current treatment plan related to hypertension self management and patient's adherence to plan as established by provider. 11-21-2020: The patient is doing well and denies any acute distress. States she has had a great summer and celebrated turning 55 with 3 different parties. The patient states that she is eating well, working in her garden, takes rest breaks, and sleeps well for the most part. Denies any issues with management of cardiac health. Will continue to monitor.  Provided education to patient re: stroke prevention, s/s of heart attack and stroke, DASH diet, complications of uncontrolled blood pressure. Sees neurologist again in August. Feels she is doing very well since having her CVA. Is back to her normal activities. Does have some issues with swelling but saw card on 11-21-2020 and is taking her medications as prescribed and also she is using compression stockings to help with fluid. She also endorses elevation of feet and legs when sitting. This helps her a lot.  Reviewed medications with patient and discussed importance of compliance. 11-21-2020: Confirms compliance with medications.  Discussed plans with patient for ongoing care management follow up and provided patient with direct contact information for care management team Advised patient, providing education and rationale, to monitor blood pressure daily and record, calling PCP for findings outside established parameters.  Reviewed scheduled/upcoming provider appointments including: 11-28-2020, reminded the patient of her appointment today for next week Self-Care Activities: - Self administers medications as  prescribed Attends all scheduled provider appointments Calls provider office for new concerns, questions, or BP outside discussed parameters Checks BP and records as discussed Follows a low sodium diet/DASH diet Patient Goals: - check blood pressure weekly - choose a place to take my blood pressure  (home, clinic or office, retail store) - write blood pressure results in a log or diary - agree on reward when goals are met - agree to work together to make changes - ask questions to understand - have a family meeting to talk about healthy habits - learn about high blood pressure  Follow Up Plan: Telephone follow up appointment with care management team member scheduled for: 01-23-2021 at 0945 am     Care Plan : RNCM: HLD Management  Updates made by Vanita Ingles, RN since 11/21/2020 12:00 AM     Problem: RNCM: HLD   Priority: Medium     Long-Range Goal: RNCM: HLD   Start Date: 05/02/2020  Expected End Date: 05/02/2021  This Visit's Progress: On track  Priority: Medium  Note:   Current Barriers:  Poorly controlled hyperlipidemia, complicated by HTN, past CVA Current antihyperlipidemic regimen: Lipitor 20 mg daily, could not tolerate the $RemoveBefo'40mg'DRMopNBYpIw$  or the $Remo'80mg'wxirm$  due to muscle pain Most recent lipid panel:     Component Value Date/Time   CHOL 160 05/28/2020 0934   CHOL 211 (H) 05/05/2016 0926   TRIG 51 05/28/2020 0934   TRIG 77 05/05/2016 0926   HDL 75 05/28/2020 0934   CHOLHDL 3.3 03/11/2020 0612   VLDL 13 03/11/2020 0612   VLDL 15 05/05/2016 0926   LDLCALC 74 05/28/2020 0934   ASCVD risk enhancing conditions: age >23,  HTN, past CVA Unable to independently HLD Unable to self administer medications as prescribed- resolved as the patient is tolerating lower dose of Lipitor 20 mg without difficulty Lacks Microbiologist Clinical Goal(s):  patient will work with Consulting civil engineer, providers, and care team towards execution of optimized self-health management plan patient will verbalize understanding of plan for effective management of HLD patient will work with [place], pcp and specialist  to address needs related to effective management of HLD patient will take all medications exactly as prescribed and will call provider for medication related questions patient will  attend all scheduled medical appointments: 11-28-2020, sees neurologist again in August  on the 25th and the cardiologist in 6 months.  the patient will demonstrate ongoing self health care management ability Interventions: Collaboration with Valerie Roys, DO regarding development and update of comprehensive plan of care as evidenced by provider attestation and co-signature Inter-disciplinary care team collaboration (see longitudinal plan of care) Medication review performed; medication list updated in electronic medical record.  Inter-disciplinary care team collaboration (see longitudinal plan of care) Referred to pharmacy team for assistance with HLD medication management Evaluation of current treatment plan related to HLD and patient's adherence to plan as established by provider. 11-21-2020: The patient is doing well and denies any issues with HLD and cardiac health. Is eating well and adherent to heart healthy diet. Will continue to monitor for changes.  Advised patient to call the office for changes in condition, questions or concerns Provided education to patient re: heart healthy diet, exercise, and managing chronic conditions Reviewed medications with patient and discussed states she is compliant with medications. 11-21-2020: Is compliant with heart healthy diet Reviewed scheduled/upcoming provider appointments including: 11-28-2020 Discussed plans with patient for ongoing care management follow up and provided patient with direct contact  information for care management team Patient Goals/Self-Care Activities: - call for medicine refill 2 or 3 days before it runs out - call if I am sick and can't take my medicine - keep a list of all the medicines I take; vitamins and herbals too - learn to read medicine labels - use a pillbox to sort medicine - use an alarm clock or phone to remind me to take my medicine - change to whole grain breads, cereal, pasta - drink 6 to 8 glasses of water each  day - eat 3 to 5 servings of fruits and vegetables each day - eat 5 or 6 small meals each day - eat fish at least once per week - fill half the plate with nonstarchy vegetables - limit fast food meals to no more than 1 per week - manage portion size - prepare main meal at home 3 to 5 days each week - read food labels for fat, fiber, carbohydrates and portion size - reduce red meat to 2 to 3 times a week - be open to making changes - I can manage, know and watch for signs of a heart attack - if I have chest pain, call for help - learn about small changes that will make a big difference - learn my personal risk factors  Follow Up Plan: Telephone follow up appointment with care management team member scheduled for: 01-23-2021 at 0945 am        Care Plan : RNCM: Constipation  Updates made by Vanita Ingles, RN since 11/21/2020 12:00 AM  Completed 11/21/2020   Problem: RNCM: Management of Constipation Resolved 11/21/2020  Priority: High     Goal: RNCM: Management of Constipation Completed 11/21/2020  Start Date: 05/02/2020  Expected End Date: 03/17/2021  This Visit's Progress: On track  Priority: High  Note:   Current Barriers: Patient denies any new issues with constipation. States she is doing much better with management of her constipation. Closing goal. Will monitor for changes Chronic Disease Management support and education needs related to chronic constipation  Unable to independently manage constipation  Lacks social connections Does not contact provider office for questions/concerns  Nurse Case Manager Clinical Goal(s):  patient will verbalize understanding of plan for effective management of constipation  patient will work with [facility] and pcp  to address needs related to constipation concerns  patient will attend all scheduled medical appointments: 11-28-2020  patient will demonstrate improved adherence to prescribed treatment plan for constipation as evidenced byimproved  bowel patterns.   Interventions:  1:1 collaboration with Valerie Roys, DO regarding development and update of comprehensive plan of care as evidenced by provider attestation and co-signature Inter-disciplinary care team collaboration (see longitudinal plan of care) Evaluation of current treatment plan related to constipation  and patient's adherence to plan as established by provider. 09-19-2020: The patient is doing better with constipation.  States still having some. Likely close the goal at next outreach.  Advised patient to call the office for changes or questions  Provided education to patient re: eating fiber in diet, drinking plenty of water, keeping active and taking medications as directed  Collaborated with pcp regarding patient states that the miralax bloats her sometimes. She is having soft stool but seems to be having a hard time getting it to pass. The patient ask for recommendations Provided patient with alternative measurers for constipation  educational materials related to difficulty with passing stool and the patient being concerned Reviewed scheduled/upcoming provider appointments including: 11-28-2020  Discussed  plans with patient for ongoing care management follow up and provided patient with direct contact information for care management team  Patient Goals/Self-Care Activities Over the next 120 days, patient will:  - Patient will self administer medications as prescribed Patient will attend all scheduled provider appointments Patient will call pharmacy for medication refills Patient will attend church or other social activities Patient will continue to perform ADL's independently Patient will continue to perform IADL's independently Patient will call provider office for new concerns or questions Patient will work with BSW to address care coordination needs and will continue to work with the clinical team to address health care and disease management related needs.   -  barriers to meeting goals identified - change-talk evoked - choices provided - collaboration with team encouraged - decision-making supported - health risks reviewed - problem-solving facilitated - questions answered - readiness for change evaluated - reassurance provided - self-reflection promoted - self-reliance encouraged - verbalization of feelings encouraged  Follow Up Plan: Telephone follow up appointment with care management team member scheduled for: 11-21-2020 at 0945 am         Plan:Telephone follow up appointment with care management team member scheduled for:  10-28--2022 at [place] am   Noreene Larsson RN, MSN, [facility] Mobile: [phone]

## 2020-11-21 NOTE — Patient Instructions (Signed)
Visit Information  PATIENT GOALS:  Goals Addressed             This Visit's Progress    RNCM: "itchy bumps" to bilateral legs       11-21-2020: The patient states she is doing well but doesn't like the "itchy bumps" to her bilateral legs. She states nobody seems to know what they are. MD notes indicate it is psoriasis and the patient does have topical cream, Lotrisone to put on the areas. This does help.  Denies any acute distress. Patient Goals: -call the provider for worsening of conditions: Itching, weeping, extreme dryness or other issues related to skin -use topical creams as directed -elevated feet and legs when sitting -keep legs and feet dry to prevent extreme moisture or risk of infection      RNCM: Keep or Improve My Strength-Stroke       Timeframe:  Short-Term Goal Priority:  Medium Start Date:     09-19-2020                        Expected End Date:    03-27-2021                   Follow Up Date 01/23/2021    - eat healthy to increase strength - increase activity or exercise time a little every week - know who to call for help if I fall - learn how to get up if I fall - plan activity for times when energy is the highest    Why is this important?   Before the stroke you probably did not think much about being safe when you are up and about.  Now, it may be harder for you to get around.  It may also be easier for you to trip or fall.  It is common to have muscle weakness after a stroke. You may also feel like you cannot control an arm or leg.  It will be helpful to work with a physical therapist to get your strength and muscle control back.  It is good to stay as active as you can. Walking and stretching help you stay strong and flexible.  The physical therapist will develop an exercise program just for you.     Notes: The patient is doing very well post stroke. Will see Dr. Melrose Nakayama in August and will likely not have to go back to see Dr. Melrose Nakayama anymore. Denies any  issues with activity level. Is enjoying working out in her garden and doing her normal activities. 11-21-2020: Saw the neurologist yesterday and got a good report. States her speech is not back at 100% but she is doing well and denies any acute issues at this time. States when she sees stairs she feels like she loses her balance. The neurologist had her stand up and close her eyes and she did drift slightly. She says the neurologist was not concerned.  The patient states that she feels like for 89 she is doing well. Is thankful she can get out and do things in her yard and she is mindful to take rest breaks.      RNCM: Lifestyle Change-Hypertension       Timeframe:  Long-Range Goal Priority:  Medium Start Date:      09-19-2020                       Expected End Date:  09-19-2021              Follow Up Date 01/23/2021    - agree on reward when goals are met - agree to work together to make changes - ask questions to understand - have a family meeting to talk about healthy habits  BP Readings from Last 3 Encounters:  05/28/20 (!) 145/77  04/10/20 (!) 143/73  03/25/20 (!) 136/49      Why is this important?   The changes that you are asked to make may be hard to do.  This is especially true when the changes are life-long.  Knowing why it is important to you is the first step.  Working on the change with your family or support person helps you not feel alone.  Reward yourself and family or support person when goals are met. This can be an activity you choose like bowling, hiking, biking, swimming or shooting hoops.     Notes: Patient feels she is managing her care well. Her daughter is here from [place] and will be taking the patient on a trip to [place] to see her son next Tuesday, then she will travel to [place] to see her grandchildren and great grandchildren. She will return home on July the 9th. She is excited about her upcoming trip and will be celebrating her 89th birthday at the  end of July. She feels blessed. 11-21-2020: The patient is doing well. Denies any issues with her HTN at this time. She had a great summer and had 3 parties to celebrate turning 4.  She went to [place], [place], and then had a party back home. She is eating well, working in her garden, pacing activity, and sleeping well. Will see the pcp a week from today for 6 month follow up. Will continue to monitor.         Patient verbalizes understanding of instructions provided today and agrees to view in [place].   Telephone follow up appointment with care management team member scheduled for:01-23-2021 at [place] am  [place], MSN, [facility] Mobile: [phone]

## 2020-11-28 ENCOUNTER — Ambulatory Visit (INDEPENDENT_AMBULATORY_CARE_PROVIDER_SITE_OTHER): Payer: PPO | Admitting: Family Medicine

## 2020-11-28 ENCOUNTER — Encounter: Payer: Self-pay | Admitting: Family Medicine

## 2020-11-28 ENCOUNTER — Other Ambulatory Visit: Payer: Self-pay

## 2020-11-28 VITALS — BP 132/80 | HR 61 | Temp 98.5°F | Ht 64.02 in | Wt 143.0 lb

## 2020-11-28 DIAGNOSIS — I10 Essential (primary) hypertension: Secondary | ICD-10-CM | POA: Diagnosis not present

## 2020-11-28 DIAGNOSIS — N183 Chronic kidney disease, stage 3 unspecified: Secondary | ICD-10-CM

## 2020-11-28 DIAGNOSIS — E785 Hyperlipidemia, unspecified: Secondary | ICD-10-CM

## 2020-11-28 DIAGNOSIS — I129 Hypertensive chronic kidney disease with stage 1 through stage 4 chronic kidney disease, or unspecified chronic kidney disease: Secondary | ICD-10-CM | POA: Diagnosis not present

## 2020-11-28 DIAGNOSIS — E039 Hypothyroidism, unspecified: Secondary | ICD-10-CM | POA: Diagnosis not present

## 2020-11-28 MED ORDER — HYDROCHLOROTHIAZIDE 25 MG PO TABS: 25.0000 mg | ORAL_TABLET | Freq: Every day | ORAL | 1 refills | Status: DC

## 2020-11-28 MED ORDER — ATORVASTATIN CALCIUM 20 MG PO TABS: 20.0000 mg | ORAL_TABLET | Freq: Every day | ORAL | 1 refills | Status: DC

## 2020-11-28 MED ORDER — BENAZEPRIL HCL 40 MG PO TABS: 40.0000 mg | ORAL_TABLET | Freq: Every day | ORAL | 1 refills | Status: DC

## 2020-11-28 NOTE — Assessment & Plan Note (Signed)
Under good control on current regimen. Continue current regimen. Continue to monitor. Call with any concerns. Refills given. Labs drawn today.   

## 2020-11-28 NOTE — Progress Notes (Signed)
BP (!) 153/67   Pulse 61   Temp 98.5 F (36.9 C) (Oral)   Ht 5' 4.02" (1.626 m)   Wt 143 lb (64.9 kg)   LMP 05/13/1980   SpO2 98%   BMI 24.53 kg/m    Subjective:    Patient ID: Cassie Brewer, female    DOB: Aug 21, 1928, 87 y.o.   MRN: [id number]  HPI: Cassie Brewer is a 87 y.o. female  Chief Complaint  Patient presents with   Hyperlipidemia   Hypothyroidism   Hypertension   HYPERTENSION / HYPERLIPIDEMIA Satisfied with current treatment? yes Duration of hypertension: chronic BP monitoring frequency: not checking BP medication side effects: no Past BP meds: benazepril, hctz Duration of hyperlipidemia: chronic Cholesterol medication side effects: no Cholesterol supplements: none Past cholesterol medications: atorvastatin Medication compliance: excellent compliance Aspirin: yes Recent stressors: no Recurrent headaches: no Visual changes: no Palpitations: no Dyspnea: no Chest pain: no Lower extremity edema: no Dizzy/lightheaded: no  HYPOTHYROIDISM Thyroid control status:stable Satisfied with current treatment? yes Medication side effects: no Medication compliance: excellent compliance Recent dose adjustment:no Fatigue: yes Cold intolerance: no Heat intolerance: no Weight gain: no Weight loss: no Constipation: no Diarrhea/loose stools: no Palpitations: no Lower extremity edema: no Anxiety/depressed mood: no  Relevant past medical, surgical, family and social history reviewed and updated as indicated. Interim medical history since our last visit reviewed. Allergies and medications reviewed and updated.  Review of Systems  Constitutional:  Positive for fatigue. Negative for activity change, appetite change, chills, diaphoresis, fever and unexpected weight change.  Respiratory: Negative.    Cardiovascular: Negative.   Gastrointestinal: Negative.   Musculoskeletal: Negative.   Psychiatric/Behavioral: Negative.     Per HPI unless specifically indicated  above     Objective:    BP (!) 153/67   Pulse 61   Temp 98.5 F (36.9 C) (Oral)   Ht 5' 4.02" (1.626 m)   Wt 143 lb (64.9 kg)   LMP 05/13/1980   SpO2 98%   BMI 24.53 kg/m   Wt Readings from Last 3 Encounters:  11/28/20 143 lb (64.9 kg)  05/28/20 149 lb 6.4 oz (67.8 kg)  04/28/20 144 lb (65.3 kg)    Physical Exam Vitals and nursing note reviewed.  Constitutional:      General: She is not in acute distress.    Appearance: Normal appearance. She is not ill-appearing, toxic-appearing or diaphoretic.  HENT:     Head: Normocephalic and atraumatic.     Right Ear: External ear normal.     Left Ear: External ear normal.     Nose: Nose normal.     Mouth/Throat:     Mouth: Mucous membranes are moist.     Pharynx: Oropharynx is clear.  Eyes:     General: No scleral icterus.       Right eye: No discharge.        Left eye: No discharge.     Extraocular Movements: Extraocular movements intact.     Conjunctiva/sclera: Conjunctivae normal.     Pupils: Pupils are equal, round, and reactive to light.  Cardiovascular:     Rate and Rhythm: Normal rate and regular rhythm.     Pulses: Normal pulses.     Heart sounds: Normal heart sounds. No murmur heard.   No friction rub. No gallop.  Pulmonary:     Effort: Pulmonary effort is normal. No respiratory distress.     Breath sounds: Normal breath sounds. No stridor. No wheezing, rhonchi or rales.  Chest:     Chest wall: No tenderness.  Musculoskeletal:        General: Normal range of motion.     Cervical back: Normal range of motion and neck supple.  Skin:    General: Skin is warm and dry.     Capillary Refill: Capillary refill takes less than 2 seconds.     Coloration: Skin is not jaundiced or pale.     Findings: No bruising, erythema, lesion or rash.  Neurological:     General: No focal deficit present.     Mental Status: She is alert and oriented to person, place, and time. Mental status is at baseline.  Psychiatric:        Mood  and Affect: Mood normal.        Behavior: Behavior normal.        Thought Content: Thought content normal.        Judgment: Judgment normal.    Results for orders placed or performed in visit on 05/28/20  CBC with Differential/Platelet  Result Value Ref Range   WBC 5.7 3.4 - 10.8 x10E3/uL   RBC 3.82 3.77 - 5.28 x10E6/uL   Hemoglobin 11.5 11.1 - 15.9 g/dL   Hematocrit 78.4 78.4 - 46.6 %   MCV 89 79 - 97 fL   MCH 30.1 26.6 - 33.0 pg   MCHC 33.7 31.5 - 35.7 g/dL   RDW 12.8 20.8 - 13.8 %   Platelets 310 150 - 450 x10E3/uL   Neutrophils 65 Not Estab. %   Lymphs 21 Not Estab. %   Monocytes 10 Not Estab. %   Eos 3 Not Estab. %   Basos 1 Not Estab. %   Neutrophils Absolute 3.6 1.4 - 7.0 x10E3/uL   Lymphocytes Absolute 1.2 0.7 - 3.1 x10E3/uL   Monocytes Absolute 0.6 0.1 - 0.9 x10E3/uL   EOS (ABSOLUTE) 0.2 0.0 - 0.4 x10E3/uL   Basophils Absolute 0.1 0.0 - 0.2 x10E3/uL   Immature Granulocytes 0 Not Estab. %   Immature Grans (Abs) 0.0 0.0 - 0.1 x10E3/uL  Comprehensive metabolic panel  Result Value Ref Range   Glucose 95 65 - 99 mg/dL   BUN 21 8 - 27 mg/dL   Creatinine, Ser 8.71 (H) 0.57 - 1.00 mg/dL   eGFR 51 (L) >95 VD/IXV/8.55   BUN/Creatinine Ratio 20 12 - 28   Sodium 140 134 - 144 mmol/L   Potassium 4.3 3.5 - 5.2 mmol/L   Chloride 102 96 - 106 mmol/L   CO2 23 20 - 29 mmol/L   Calcium 9.3 8.7 - 10.3 mg/dL   Total Protein 6.8 6.0 - 8.5 g/dL   Albumin 4.2 3.6 - 4.6 g/dL   Globulin, Total 2.6 1.5 - 4.5 g/dL   Albumin/Globulin Ratio 1.6 1.2 - 2.2   Bilirubin Total 0.5 0.0 - 1.2 mg/dL   Alkaline Phosphatase 66 44 - 121 IU/L   AST 16 0 - 40 IU/L   ALT 15 0 - 32 IU/L  Lipid Panel w/o Chol/HDL Ratio  Result Value Ref Range   Cholesterol, Total 160 100 - 199 mg/dL   Triglycerides 51 0 - 149 mg/dL   HDL 75 >01 mg/dL   VLDL Cholesterol Cal 11 5 - 40 mg/dL   LDL Chol Calc (NIH) 74 0 - 99 mg/dL  TSH  Result Value Ref Range   TSH 1.420 0.450 - 4.500 uIU/mL      Assessment &  Plan:   Problem List Items Addressed This Visit  Endocrine   Hypothyroidism    Rechecking labs today. Await results. Treat as needed. Call with any concerns.       Relevant Orders   CBC with Differential/Platelet   Comprehensive metabolic panel     Genitourinary   Benign hypertensive renal disease - Primary    Under good control on current regimen. Continue current regimen. Continue to monitor. Call with any concerns. Refills given. Labs drawn today.       Relevant Medications   hydrochlorothiazide (HYDRODIURIL) 25 MG tablet   benazepril (LOTENSIN) 40 MG tablet   Other Relevant Orders   TSH   CBC with Differential/Platelet   Comprehensive metabolic panel   CKD (chronic kidney disease), stage III ([place])    Rechecking labs today. Await results. Treat as needed. Call with any concerns.       Relevant Orders   CBC with Differential/Platelet   Comprehensive metabolic panel     Other   Hyperlipidemia    Under good control on current regimen. Continue current regimen. Continue to monitor. Call with any concerns. Refills given. Labs drawn today.       Relevant Medications   hydrochlorothiazide (HYDRODIURIL) 25 MG tablet   benazepril (LOTENSIN) 40 MG tablet   atorvastatin (LIPITOR) 20 MG tablet   Other Relevant Orders   Lipid Panel w/o Chol/HDL Ratio   CBC with Differential/Platelet   Comprehensive metabolic panel   Other Visit Diagnoses     Essential hypertension       Relevant Medications   hydrochlorothiazide (HYDRODIURIL) 25 MG tablet   benazepril (LOTENSIN) 40 MG tablet   atorvastatin (LIPITOR) 20 MG tablet        Follow up plan: Return in about 3 months (around 02/27/2021).

## 2020-11-28 NOTE — Assessment & Plan Note (Signed)
Rechecking labs today. Await results. Treat as needed. Call with any concerns.  

## 2020-11-29 LAB — COMPREHENSIVE METABOLIC PANEL
ALT: 16 IU/L (ref 0–32)
AST: 18 IU/L (ref 0–40)
Albumin/Globulin Ratio: 2 (ref 1.2–2.2)
Albumin: 4.5 g/dL (ref 3.5–4.6)
Alkaline Phosphatase: 63 IU/L (ref 44–121)
BUN/Creatinine Ratio: 14 (ref 12–28)
BUN: 13 mg/dL (ref 10–36)
Bilirubin Total: 0.6 mg/dL (ref 0.0–1.2)
CO2: 24 mmol/L (ref 20–29)
Calcium: 9.7 mg/dL (ref 8.7–10.3)
Chloride: 100 mmol/L (ref 96–106)
Creatinine, Ser: 0.95 mg/dL (ref 0.57–1.00)
Globulin, Total: 2.3 g/dL (ref 1.5–4.5)
Glucose: 102 mg/dL — ABNORMAL HIGH (ref 65–99)
Potassium: 4.4 mmol/L (ref 3.5–5.2)
Sodium: 138 mmol/L (ref 134–144)
Total Protein: 6.8 g/dL (ref 6.0–8.5)
eGFR: 57 mL/min/{1.73_m2} — ABNORMAL LOW (ref >59)

## 2020-11-29 LAB — CBC WITH DIFFERENTIAL/PLATELET
Basophils Absolute: 0.1 10*3/uL (ref 0.0–0.2)
Basos: 1 %
EOS (ABSOLUTE): 0.3 10*3/uL (ref 0.0–0.4)
Eos: 5 %
Hematocrit: 35.7 % (ref 34.0–46.6)
Hemoglobin: 12.3 g/dL (ref 11.1–15.9)
Immature Grans (Abs): 0 10*3/uL (ref 0.0–0.1)
Immature Granulocytes: 0 %
Lymphocytes Absolute: 1.2 10*3/uL (ref 0.7–3.1)
Lymphs: 21 %
MCH: 29.6 pg (ref 26.6–33.0)
MCHC: 34.5 g/dL (ref 31.5–35.7)
MCV: 86 fL (ref 79–97)
Monocytes Absolute: 0.6 10*3/uL (ref 0.1–0.9)
Monocytes: 11 %
Neutrophils Absolute: 3.5 10*3/uL (ref 1.4–7.0)
Neutrophils: 62 %
Platelets: 314 10*3/uL (ref 150–450)
RBC: 4.16 x10E6/uL (ref 3.77–5.28)
RDW: 13 % (ref 11.7–15.4)
WBC: 5.6 10*3/uL (ref 3.4–10.8)

## 2020-11-29 LAB — LIPID PANEL W/O CHOL/HDL RATIO
Cholesterol, Total: 170 mg/dL (ref 100–199)
HDL: 76 mg/dL (ref >39)
LDL Chol Calc (NIH): 83 mg/dL (ref 0–99)
Triglycerides: 55 mg/dL (ref 0–149)
VLDL Cholesterol Cal: 11 mg/dL (ref 5–40)

## 2020-11-29 LAB — TSH: TSH: 0.635 u[IU]/mL (ref 0.450–4.500)

## 2020-12-01 ENCOUNTER — Other Ambulatory Visit: Payer: Self-pay | Admitting: Family Medicine

## 2020-12-01 DIAGNOSIS — E039 Hypothyroidism, unspecified: Secondary | ICD-10-CM

## 2020-12-01 MED ORDER — LEVOTHYROXINE SODIUM 112 MCG PO TABS: 112.0000 ug | ORAL_TABLET | Freq: Every day | ORAL | 3 refills | Status: DC

## 2020-12-12 ENCOUNTER — Telehealth: Payer: Self-pay

## 2020-12-12 NOTE — Telephone Encounter (Signed)
Copied from [place] [id number]. Topic: General - Other >> Dec 12, 2020  8:44 AM Leward Quan A wrote: Reason for CRM: Patient called in and stated that she have asked Dr Wynetta Emery to please mail her a copy of her most recent labs. Patient asking Dr Wynetta Emery nurse to please call her and go over the labs with her also. Please call patient at Ph# [phone]   Called and gave lab results to patient per result note. Labs printed and mailed to patient as requested.

## 2020-12-18 ENCOUNTER — Encounter: Payer: Self-pay | Admitting: Podiatry

## 2020-12-18 ENCOUNTER — Ambulatory Visit: Payer: PPO | Admitting: Podiatry

## 2020-12-18 ENCOUNTER — Other Ambulatory Visit: Payer: Self-pay

## 2020-12-18 DIAGNOSIS — M79674 Pain in right toe(s): Secondary | ICD-10-CM | POA: Diagnosis not present

## 2020-12-18 DIAGNOSIS — B351 Tinea unguium: Secondary | ICD-10-CM | POA: Diagnosis not present

## 2020-12-18 DIAGNOSIS — M79675 Pain in left toe(s): Secondary | ICD-10-CM | POA: Diagnosis not present

## 2020-12-18 NOTE — Progress Notes (Signed)
  Subjective:  Patient ID: Cassie Brewer, female    DOB: 04-18-1928,  MRN: [id number]  Chief Complaint  Patient presents with   Nail Problem    "He trims my toenails and checks my feet."   87 y.o. female returns for the above complaint.  Patient presents with thickened elongated mycotic toenails x10.  Patient is especially concerned for right hallux onychomycosis.  Patient not able to debride on herself.  She would like for [place] to debride down.  She does not have any secondary complaints. Objective:   There were no vitals filed for this visit. Podiatric Exam: Vascular: dorsalis pedis and posterior tibial pulses are palpable bilateral. Capillary return is immediate. Temperature gradient is WNL. Skin turgor WNL  Sensorium: Normal Semmes Weinstein monofilament test. Normal tactile sensation bilaterally.  However she experiences subjective neuropathic pain to the distal tip of the toes.  Intact sensation Nail Exam: Pt has thick disfigured discolored nails with subungual debris noted bilateral entire nail hallux through fifth toenails.  Ingrown noted to the right hallux lateral border.  Mild pain on palpation. Ulcer Exam: There is no evidence of ulcer or pre-ulcerative changes or infection. Orthopedic Exam: Muscle tone and strength are WNL. No limitations in general ROM. No crepitus or effusions noted. HAV  B/L.  Hammer toes 2-5  B/L. Skin: No Porokeratosis. No infection or ulcers.  Xerosis/psoriasis of the plantar foot with now subjective component of itching..  There is mild cracking noted.  No bleeding noted.  Assessment & Plan:  Patient was evaluated and treated and all questions answered.  Athlete's foot bilateral -Explained to patient the etiology of athlete's foot and various treatment options were discussed.  Given that there is subjective complaint of itching associated with the bottom of the foot I believe she will benefit from Lotrisone cream.  I have asked her to apply twice a day.  She  states understanding.  Neuropathic pain -I explained to the patient the etiology of neuropathic pain and various treatment options were extensively discussed.  I believe she will benefit from neuropathic cream to help decrease the pain associated with this -Prescription for neuropathic cream was sent to [facility] pharmacy.  Xerosis/psoriasis -I explained to the patient the etiology of dryness with cracking as well as various treatment options associated with it.  Given that she is doing good with clotrimazole cream that was given to her by dermatologist and had diagnosed her with psoriasis, I believe she can continue using the cream.  Patient agrees with this plan.  Onychomycosis with pain  -Nails palliatively debrided as below. -Educated on self-care  Procedure: Nail Debridement Rationale: pain  Type of Debridement: manual, sharp debridement. Instrumentation: Nail nipper, rotary burr. Number of Nails: 10  Procedures and Treatment: Consent by patient was obtained for treatment procedures. The patient understood the discussion of treatment and procedures well. All questions were answered thoroughly reviewed. Debridement of mycotic and hypertrophic toenails, 1 through 5 bilateral and clearing of subungual debris. No ulceration, no infection noted.  Return Visit-Office Procedure: Patient instructed to return to the office for a follow up visit 3 months for continued evaluation and treatment.  Boneta Lucks, DPM    No follow-ups on file.

## 2021-01-14 ENCOUNTER — Telehealth: Payer: Self-pay | Admitting: Family Medicine

## 2021-01-14 NOTE — Telephone Encounter (Signed)
Copied from [place] [id number]. Topic: General - Other >> Jan 14, 2021 11:09 AM Tessa Lerner A wrote: Reason for CRM: The patient would like to be contacted by a member of staff to discuss their latest labs   The patient is concerned with their comprehensive metabolic panel  Please contact further when available

## 2021-01-14 NOTE — Telephone Encounter (Signed)
Called patient to discuss lab results, no answer, left a voicemail for patientto return my call.    [place] for nurse triage to give results if patient calls back.

## 2021-01-23 ENCOUNTER — Ambulatory Visit (INDEPENDENT_AMBULATORY_CARE_PROVIDER_SITE_OTHER): Payer: PPO

## 2021-01-23 ENCOUNTER — Other Ambulatory Visit: Payer: Self-pay

## 2021-01-23 ENCOUNTER — Telehealth: Payer: PPO | Admitting: General Practice

## 2021-01-23 ENCOUNTER — Telehealth: Payer: Self-pay | Admitting: Family Medicine

## 2021-01-23 DIAGNOSIS — Z23 Encounter for immunization: Secondary | ICD-10-CM

## 2021-01-23 DIAGNOSIS — E785 Hyperlipidemia, unspecified: Secondary | ICD-10-CM

## 2021-01-23 DIAGNOSIS — I10 Essential (primary) hypertension: Secondary | ICD-10-CM

## 2021-01-23 DIAGNOSIS — E039 Hypothyroidism, unspecified: Secondary | ICD-10-CM

## 2021-01-23 NOTE — Telephone Encounter (Signed)
Patient notified of Dr. Durenda Age response.

## 2021-01-23 NOTE — Patient Instructions (Signed)
Visit Information  PATIENT GOALS:  Goals Addressed             This Visit's Progress    COMPLETED: RNCM: "itchy bumps" to bilateral legs       11-21-2020: The patient states she is doing well but doesn't like the "itchy bumps" to her bilateral legs. She states nobody seems to know what they are. MD notes indicate it is psoriasis and the patient does have topical cream, Lotrisone to put on the areas. This does help.  Denies any acute distress. 01-23-2021: Denies any issues with "itchy bumps". Will continue to monitor and reopen goal for any new concerns. Patient Goals: -call the provider for worsening of conditions: Itching, weeping, extreme dryness or other issues related to skin -use topical creams as directed -elevated feet and legs when sitting -keep legs and feet dry to prevent extreme moisture or risk of infection      RNCM: Keep or Improve My Strength-Stroke       Timeframe:  Short-Term Goal Priority:  Medium Start Date:     09-19-2020                        Expected End Date:    03-27-2021                   Follow Up Date 03/17/2021    - eat healthy to increase strength - increase activity or exercise time a little every week - know who to call for help if I fall - learn how to get up if I fall - plan activity for times when energy is the highest    Why is this important?   Before the stroke you probably did not think much about being safe when you are up and about.  Now, it may be harder for you to get around.  It may also be easier for you to trip or fall.  It is common to have muscle weakness after a stroke. You may also feel like you cannot control an arm or leg.  It will be helpful to work with a physical therapist to get your strength and muscle control back.  It is good to stay as active as you can. Walking and stretching help you stay strong and flexible.  The physical therapist will develop an exercise program just for you.     Notes: The patient is doing very  well post stroke. Will see Dr. Melrose Nakayama in August and will likely not have to go back to see Dr. Melrose Nakayama anymore. Denies any issues with activity level. Is enjoying working out in her garden and doing her normal activities. 11-21-2020: Saw the neurologist yesterday and got a good report. States her speech is not back at 100% but she is doing well and denies any acute issues at this time. States when she sees stairs she feels like she loses her balance. The neurologist had her stand up and close her eyes and she did drift slightly. She says the neurologist was not concerned.  The patient states that she feels like for 89 she is doing well. Is thankful she can get out and do things in her yard and she is mindful to take rest breaks. 01-23-2021: The patient has been doing well. Denies any acute findings. She has been working in her yard and getting her plants in. Paces activity. Denies any new concerns at this time. Will continue to monitor.  RNCM: Lifestyle Change-Hypertension       Timeframe:  Long-Range Goal Priority:  Medium Start Date:      09-19-2020                       Expected End Date:         09-19-2021              Follow Up Date 03/17/2021    - agree on reward when goals are met - agree to work together to make changes - ask questions to understand - have a family meeting to talk about healthy habits  BP Readings from Last 3 Encounters:  11/28/20 132/80  05/28/20 (!) 145/77  04/10/20 (!) 143/73      Why is this important?   The changes that you are asked to make may be hard to do.  This is especially true when the changes are life-long.  Knowing why it is important to you is the first step.  Working on the change with your family or support person helps you not feel alone.  Reward yourself and family or support person when goals are met. This can be an activity you choose like bowling, hiking, biking, swimming or shooting hoops.     Notes: Patient feels she is managing her care  well. Her daughter is here from [place] and will be taking the patient on a trip to [place] to see her son next Tuesday, then she will travel to [place] to see her grandchildren and great grandchildren. She will return home on July the 9th. She is excited about her upcoming trip and will be celebrating her 89th birthday at the end of July. She feels blessed. 11-21-2020: The patient is doing well. Denies any issues with her HTN at this time. She had a great summer and had 3 parties to celebrate turning 13.  She went to [place], [place], and then had a party back home. She is eating well, working in her garden, pacing activity, and sleeping well. Will see the pcp a week from today for 6 month follow up. Will continue to monitor. 01-23-2021: The patient states that she feels overall she is doing very well. She states that she is eating well, working in her garden and other than feeling some tired she is doing good. Did want to get the high dose flu vaccine. Coordinated with [place], [place] for assistance with getting flu vaccination this afternoon at the office. Will continue to monitor for changes.         Patient verbalizes understanding of instructions provided today and agrees to view in [place].   Telephone follow up appointment with care management team member scheduled for: 03-17-2021 at [place] am  Noreene Larsson RN, MSN, [facility] Mobile: [phone]

## 2021-01-23 NOTE — Telephone Encounter (Signed)
-----   Message from Vanita Ingles, RN sent at 01/23/2021 10:46 AM EDT ----- Cassie Brewer Dr. Wynetta Emery,  I reviewed the patients TSH level with her and told her it was normal. She is concerned because it is on the low end of normal. She wants to know if her medications need to be changes. She said she was always told that it should be on the higher end of normal. Complaining of more tiredness and exhaustion. Please advise?

## 2021-01-23 NOTE — Chronic Care Management (AMB) (Signed)
Chronic Care Management   CCM RN Visit Note  01/23/2021 Name: Cassie Brewer MRN: [id number] DOB: 09/04/1928  Subjective: Cassie Brewer is a 87 y.o. year old female who is a primary care patient of Valerie Roys, DO. The care management team was consulted for assistance with disease management and care coordination needs.    Engaged with patient by telephone for follow up visit in response to provider referral for case management and/or care coordination services.   Consent to Services:  The patient was given information about Chronic Care Management services, agreed to services, and gave verbal consent prior to initiation of services.  Please see initial visit note for detailed documentation.   Patient agreed to services and verbal consent obtained.   Assessment: Review of patient past medical history, allergies, medications, health status, including review of consultants reports, laboratory and other test data, was performed as part of comprehensive evaluation and provision of chronic care management services.   SDOH (Social Determinants of Health) assessments and interventions performed:  SDOH Interventions    Flowsheet Row Most Recent Value  SDOH Interventions   Food Insecurity Interventions Intervention Not Indicated  Financial Strain Interventions Intervention Not Indicated  Housing Interventions Intervention Not Indicated  Intimate Partner Violence Interventions Intervention Not Indicated  Physical Activity Interventions Intervention Not Indicated, Other (Comments)  [active in her yard]  Stress Interventions Intervention Not Indicated  Social Connections Interventions Intervention Not Indicated  Transportation Interventions Intervention Not Indicated        CCM Care Plan  Allergies  Allergen Reactions   Amoxicillin     Other reaction(s): Other (See Comments) "blood pressure very high - almost passed out"   Eryc [Erythromycin] Other (See Comments)   Sulfa  Antibiotics Nausea And Vomiting    Nausea with bactrim in 2017  Nausea with bactrim in 2017     Outpatient Encounter Medications as of 01/23/2021  Medication Sig   aspirin EC 81 MG EC tablet Take 1 tablet (81 mg total) by mouth daily. Swallow whole.   atorvastatin (LIPITOR) 20 MG tablet Take 1 tablet (20 mg total) by mouth daily.   benazepril (LOTENSIN) 40 MG tablet Take 1 tablet (40 mg total) by mouth daily.   Calcium Carbonate-Vit D-Min (CALCIUM 600+D PLUS MINERALS) 600-400 MG-UNIT TABS Take 1 tablet by mouth as directed.   clotrimazole-betamethasone (LOTRISONE) cream Apply 1 application topically 2 (two) times daily.   famotidine (PEPCID) 40 MG tablet TAKE 1 TABLET BY MOUTH EVERYDAY AT BEDTIME (Patient not taking: Reported on 11/28/2020)   fluticasone (FLONASE) 50 MCG/ACT nasal spray Place 2 sprays into both nostrils daily as needed for allergies.   hydrochlorothiazide (HYDRODIURIL) 25 MG tablet Take 1 tablet (25 mg total) by mouth daily.   hydrocortisone (ANUSOL-HC) 2.5 % rectal cream Place 1 application rectally 2 (two) times daily.   latanoprost (XALATAN) 0.005 % ophthalmic solution Place 1 drop into both eyes at bedtime.   levothyroxine (SYNTHROID) 112 MCG tablet Take 1 tablet (112 mcg total) by mouth daily before breakfast.   Multiple Vitamins-Minerals (CENTRUM SILVER ULTRA WOMENS PO) Take 1 tablet by mouth daily.   Omega-3 Fatty Acids (FISH OIL) 1000 MG CAPS Take 1,000 mg by mouth daily.   polyethylene glycol powder (GLYCOLAX/MIRALAX) 17 GM/SCOOP powder Take 17 g by mouth 2 (two) times daily as needed.   timolol (TIMOPTIC) 0.5 % ophthalmic solution Place 1 drop into the left eye daily.   vitamin C (ASCORBIC ACID) 500 MG tablet Take 500 mg by mouth daily.  No facility-administered encounter medications on file as of 01/23/2021.    Patient Active Problem List   Diagnosis Date Noted   Aphasia 03/21/2020   Aphasia S/P CVA 03/11/2020   TIA (transient ischemic attack) 03/10/2020    Arthritis of foot 04/24/2015   Arthropathy 04/24/2015   Benign hypertensive renal disease 10/02/2014   Hypothyroidism 10/02/2014   Hyperlipidemia 10/02/2014   CKD (chronic kidney disease), stage III ([place]) 10/02/2014    Conditions to be addressed/monitored:HTN, HLD, and Hypothyroidism  Care Plan : RNCM: Hypertension (Adult)  Updates made by Vanita Ingles, RN since 01/23/2021 12:00 AM  Completed 01/23/2021   Problem: RNCM: Hypertension (Hypertension) Resolved 01/23/2021  Priority: Medium     Long-Range Goal: RNCM: Hypertension Monitored Completed 01/23/2021  Start Date: 05/02/2020  Expected End Date: 05/17/2021  Recent Progress: On track  Priority: Medium  Note:   Objective: Resolving duplicate goal Last practice recorded BP readings:  BP Readings from Last 3 Encounters:  05/28/20 (!) 145/77  04/10/20 (!) 143/73  03/25/20 (!) 136/49   Most recent eGFR/CrCl:  Lab Results  Component Value Date   EGFR 51 (L) 05/28/2020    No components found for: CRCL Current Barriers:  Knowledge Deficits related to basic understanding of hypertension pathophysiology and self care management Knowledge Deficits related to understanding of medications prescribed for management of hypertension Limited Social Support Unable to independently manage HTN Lives alone, adult children in [place] and [place]):  patient will verbalize understanding of plan for hypertension management patient will attend all scheduled medical appointments: 11-28-2020  patient will demonstrate improved adherence to prescribed treatment plan for hypertension as evidenced by taking all medications as prescribed, monitoring and recording blood pressure as directed, adhering to low sodium/DASH diet patient will demonstrate improved health management independence as evidenced by checking blood pressure as directed and notifying PCP if SBP>150 or DBP > 90, taking all medications as prescribe, and  adhering to a low sodium diet as discussed. patient will verbalize basic understanding of hypertension disease process and self health management plan as evidenced by compliance with heart healthy diet, compliance with medications and working with the CCM team to optimize health and well being.  Interventions:  Collaboration with Valerie Roys, DO regarding development and update of comprehensive plan of care as evidenced by provider attestation and co-signature Inter-disciplinary care team collaboration (see longitudinal plan of care) Evaluation of current treatment plan related to hypertension self management and patient's adherence to plan as established by provider. 11-21-2020: The patient is doing well and denies any acute distress. States she has had a great summer and celebrated turning 67 with 3 different parties. The patient states that she is eating well, working in her garden, takes rest breaks, and sleeps well for the most part. Denies any issues with management of cardiac health. Will continue to monitor.  Provided education to patient re: stroke prevention, s/s of heart attack and stroke, DASH diet, complications of uncontrolled blood pressure. Sees neurologist again in August. Feels she is doing very well since having her CVA. Is back to her normal activities. Does have some issues with swelling but saw card on 11-21-2020 and is taking her medications as prescribed and also she is using compression stockings to help with fluid. She also endorses elevation of feet and legs when sitting. This helps her a lot.  Reviewed medications with patient and discussed importance of compliance. 11-21-2020: Confirms compliance with medications.  Discussed plans with patient for ongoing care  management follow up and provided patient with direct contact information for care management team Advised patient, providing education and rationale, to monitor blood pressure daily and record, calling PCP for findings  outside established parameters.  Reviewed scheduled/upcoming provider appointments including: 11-28-2020, reminded the patient of her appointment today for next week Self-Care Activities: - Self administers medications as prescribed Attends all scheduled provider appointments Calls provider office for new concerns, questions, or BP outside discussed parameters Checks BP and records as discussed Follows a low sodium diet/DASH diet Patient Goals: - check blood pressure weekly - choose a place to take my blood pressure (home, clinic or office, retail store) - write blood pressure results in a log or diary - agree on reward when goals are met - agree to work together to make changes - ask questions to understand - have a family meeting to talk about healthy habits - learn about high blood pressure  Follow Up Plan: Telephone follow up appointment with care management team member scheduled for: 01-23-2021 at 0945 am     Care Plan : RNCM: HLD Management  Updates made by Vanita Ingles, RN since 01/23/2021 12:00 AM  Completed 01/23/2021   Problem: RNCM: HLD Resolved 01/23/2021  Priority: Medium     Long-Range Goal: RNCM: HLD Completed 01/23/2021  Start Date: 05/02/2020  Expected End Date: 05/02/2021  Recent Progress: On track  Priority: Medium  Note:   Current Barriers: Resolving, duplicate goals Poorly controlled hyperlipidemia, complicated by HTN, past CVA Current antihyperlipidemic regimen: Lipitor 20 mg daily, could not tolerate the $RemoveBefo'40mg'MOsFByrbIjl$  or the $Remo'80mg'TxgUF$  due to muscle pain Most recent lipid panel:     Component Value Date/Time   CHOL 160 05/28/2020 0934   CHOL 211 (H) 05/05/2016 0926   TRIG 51 05/28/2020 0934   TRIG 77 05/05/2016 0926   HDL 75 05/28/2020 0934   CHOLHDL 3.3 03/11/2020 0612   VLDL 13 03/11/2020 0612   VLDL 15 05/05/2016 0926   LDLCALC 74 05/28/2020 0934   ASCVD risk enhancing conditions: age >27,  HTN, past CVA Unable to independently HLD Unable to self administer  medications as prescribed- resolved as the patient is tolerating lower dose of Lipitor 20 mg without difficulty Lacks Microbiologist Clinical Goal(s):  patient will work with Consulting civil engineer, providers, and care team towards execution of optimized self-health management plan patient will verbalize understanding of plan for effective management of HLD patient will work with RNCM, pcp and specialist  to address needs related to effective management of HLD patient will take all medications exactly as prescribed and will call provider for medication related questions patient will attend all scheduled medical appointments: 11-28-2020, sees neurologist again in August  on the 25th and the cardiologist in 6 months.  the patient will demonstrate ongoing self health care management ability Interventions: Collaboration with Valerie Roys, DO regarding development and update of comprehensive plan of care as evidenced by provider attestation and co-signature Inter-disciplinary care team collaboration (see longitudinal plan of care) Medication review performed; medication list updated in electronic medical record.  Inter-disciplinary care team collaboration (see longitudinal plan of care) Referred to pharmacy team for assistance with HLD medication management Evaluation of current treatment plan related to HLD and patient's adherence to plan as established by provider. 11-21-2020: The patient is doing well and denies any issues with HLD and cardiac health. Is eating well and adherent to heart healthy diet. Will continue to monitor for changes.  Advised patient to call the  office for changes in condition, questions or concerns Provided education to patient re: heart healthy diet, exercise, and managing chronic conditions Reviewed medications with patient and discussed states she is compliant with medications. 11-21-2020: Is compliant with heart healthy diet Reviewed scheduled/upcoming  provider appointments including: 11-28-2020 Discussed plans with patient for ongoing care management follow up and provided patient with direct contact information for care management team Patient Goals/Self-Care Activities: - call for medicine refill 2 or 3 days before it runs out - call if I am sick and can't take my medicine - keep a list of all the medicines I take; vitamins and herbals too - learn to read medicine labels - use a pillbox to sort medicine - use an alarm clock or phone to remind me to take my medicine - change to whole grain breads, cereal, pasta - drink 6 to 8 glasses of water each day - eat 3 to 5 servings of fruits and vegetables each day - eat 5 or 6 small meals each day - eat fish at least once per week - fill half the plate with nonstarchy vegetables - limit fast food meals to no more than 1 per week - manage portion size - prepare main meal at home 3 to 5 days each week - read food labels for fat, fiber, carbohydrates and portion size - reduce red meat to 2 to 3 times a week - be open to making changes - I can manage, know and watch for signs of a heart attack - if I have chest pain, call for help - learn about small changes that will make a big difference - learn my personal risk factors  Follow Up Plan: Telephone follow up appointment with care management team member scheduled for: 01-23-2021 at 0945 am        Care Plan : RNCM: General Plan of Care (Adult) for Chronic Disease Manage and Care Coordination Needs  Updates made by Vanita Ingles, RN since 01/23/2021 12:00 AM     Problem: RNCM: Development of Plan of Care for Chronic Disease Management (HTN, HLD, Constipation, Hypothyroidism)   Priority: High     Long-Range Goal: RNCM: Effective Management of Plan of Care for Chronic Disease Management (HTN, HLD, Constipation, Hypothyroidism)   Start Date: 01/23/2021  Expected End Date: 01/23/2022  Priority: High  Note:   Current Barriers:  Knowledge  Deficits related to plan of care for management of HTN, HLD, and Hypothyroidism and Constipation  Chronic Disease Management support and education needs related to HTN, HLD, and Hypothyroidism and Constipation  RNCM Clinical Goal(s):  Patient will verbalize understanding of plan for management of HTN, HLD, Hypothyroidism, and Constipation   verbalize basic understanding of HTN, HLD, Hypothyroidism, and Constipation  disease process and self health management plan   take all medications exactly as prescribed and will call provider for medication related questions demonstrate understanding of rationale for each prescribed medication   attend all scheduled medical appointments: 03-06-2021 at 0920 am demonstrate improved and ongoing adherence to prescribed treatment plan for HTN, HLD, Hypothyroidism, and Constipation as evidenced by adherence to prescribed medication regimen contacting provider for new or worsened symptoms or questions   demonstrate a decrease in HTN, HLD, Hypothyroidism, and constipation  exacerbations   demonstrate ongoing self health care management ability to effectively manage chronic conditions   through collaboration with RN Care manager, provider, and care team.   Interventions: 1:1 collaboration with primary care provider regarding development and update of comprehensive plan of care  as evidenced by provider attestation and co-signature Inter-disciplinary care team collaboration (see longitudinal plan of care) Evaluation of current treatment plan related to  self management and patient's adherence to plan as established by provider   SDOH Barriers (Status: Goal on track: YES.) Long Term Goal (>30 days) Patient interviewed and SDOH assessment performed        SDOH Interventions    Flowsheet Row Most Recent Value  SDOH Interventions   Food Insecurity Interventions Intervention Not Indicated  Financial Strain Interventions Intervention Not Indicated  Housing  Interventions Intervention Not Indicated  Intimate Partner Violence Interventions Intervention Not Indicated  Physical Activity Interventions Intervention Not Indicated, Other (Comments)  [active in her yard]  Stress Interventions Intervention Not Indicated  Social Connections Interventions Intervention Not Indicated  Transportation Interventions Intervention Not Indicated     Patient interviewed and appropriate assessments performed Provided patient with information about changes in SDOH, and resources to help with any needs. Care guides being available to assist with changes  Discussed plans with patient for ongoing care management follow up and provided patient with direct contact information for care management team Advised patient to call the office for changes in SDOH, new concerns or questions.     Hypothyroidism  (Status: Goal on track: YES.) Evaluation of current treatment plan related to  Hypothyroidism,  self-management and patient's adherence to plan as established by provider. 01-23-2021: The patient states that she is feeling more tired than usual. She is a little concerned that her thyroid levels are on the low end of normal and said the the specialist always told her that it should be on the higher end of normal. She would like to know from Dr. Wynetta Emery if her levothyroxine dose should be changed. Will collaborate with Dr. Wynetta Emery and send an inbasket message to Dr. Wynetta Emery for recommendations. Reviewed the results with the patient and confirmed that she is taking levothyroxine 112 mcg daily as prescribed.  Discussed plans with patient for ongoing care management follow up and provided patient with direct contact information for care management team Advised patient to call the office for changes in condition, questions, or concerns; Provided education to patient re: thyroid level being in normal range.  The patient is concerned it is on the low end of normal and was told before it  needed to be on the higher range. ; Reviewed medications with patient and discussed compliance. The patient is taking levothyroxine 112 mcg as prescribed; Collaborated with Dr. Wynetta Emery regarding patients questions related to her hypothyroidism and concern that is was on the low end of normal and she is feeling more "tired and exhausted" then usual; Provided patient with tsh educational materials related to hypothyroidism ; Reviewed scheduled/upcoming provider appointments including 03-06-2021 at 0920 am, the patient is going in the office today to get her flu vaccine; Discussed plans with patient for ongoing care management follow up and provided patient with direct contact information for care management team; Screening for signs and symptoms of depression related to chronic disease state;  Assessed social determinant of health barriers;   Hyperlipidemia:  (Status: Goal on track: YES.) Lab Results  Component Value Date   CHOL 170 11/28/2020   HDL 76 11/28/2020   LDLCALC 83 11/28/2020   TRIG 55 11/28/2020   CHOLHDL 3.3 03/11/2020     Medication review performed; medication list updated in electronic medical record.  Provider established cholesterol goals reviewed; Counseled on importance of regular laboratory monitoring as prescribed; Provided HLD educational materials; Reviewed role  and benefits of statin for ASCVD risk reduction; Discussed strategies to manage statin-induced myalgias; Reviewed importance of limiting foods high in cholesterol;  Hypertension: (Status: Goal on track: YES.) Last practice recorded BP readings:  BP Readings from Last 3 Encounters:  11/28/20 132/80  05/28/20 (!) 145/77  04/10/20 (!) 143/73  Most recent eGFR/CrCl:  Lab Results  Component Value Date   EGFR 57 (L) 11/28/2020    No components found for: CRCL  Evaluation of current treatment plan related to hypertension self management and patient's adherence to plan as established by provider;    Provided education to patient re: stroke prevention, s/s of heart attack and stroke; Reviewed prescribed diet Heart Healthy Reviewed medications with patient and discussed importance of compliance;  Discussed plans with patient for ongoing care management follow up and provided patient with direct contact information for care management team; Advised patient, providing education and rationale, to monitor blood pressure daily and record, calling PCP for findings outside established parameters;  Reviewed scheduled/upcoming provider appointments including: 03-17-2021 at 0945 am Provided education on prescribed diet Heart healthy;  Discussed complications of poorly controlled blood pressure such as heart disease, stroke, circulatory complications, vision complications, kidney impairment, sexual dysfunction;   Patient Goals/Self-Care Activities: Patient will self administer medications as prescribed as evidenced by self report/primary caregiver report  Patient will attend all scheduled provider appointments as evidenced by clinician review of documented attendance to scheduled appointments and patient/caregiver report Patient will call pharmacy for medication refills as evidenced by patient report and review of pharmacy fill history as appropriate Patient will attend church or other social activities as evidenced by patient report Patient will continue to perform ADL's independently as evidenced by patient/caregiver report Patient will continue to perform IADL's independently as evidenced by patient/caregiver report Patient will call provider office for new concerns or questions as evidenced by review of documented incoming telephone call notes and patient report Patient will work with BSW to address care coordination needs and will continue to work with the clinical team to address health care and disease management related needs as evidenced by documented adherence to scheduled care management/care  coordination appointments - check blood pressure 3 times per week - choose a place to take my blood pressure (home, clinic or office, retail store) - write blood pressure results in a log or diary - learn about high blood pressure - keep a blood pressure log - take blood pressure log to all doctor appointments - call doctor for signs and symptoms of high blood pressure - develop an action plan for high blood pressure - keep all doctor appointments - take medications for blood pressure exactly as prescribed - report new symptoms to your doctor - eat more whole grains, fruits and vegetables, lean meats and healthy fats - call for medicine refill 2 or 3 days before it runs out - take all medications exactly as prescribed - call doctor with any symptoms you believe are related to your medicine - call doctor when you experience any new symptoms - go to all doctor appointments as scheduled - adhere to prescribed diet: heart Healthy        Plan:Telephone follow up appointment with care management team member scheduled for:  03-17-2021 at [place] am  Noreene Larsson RN, MSN, [facility] Mobile: [phone]

## 2021-01-23 NOTE — Telephone Encounter (Signed)
Please let her know that her thyroid is in the normal range. I do not think her fatigue is related to her thyroid. We can recheck it next time, but no need to change her medicine at this time.

## 2021-01-26 DIAGNOSIS — E785 Hyperlipidemia, unspecified: Secondary | ICD-10-CM

## 2021-01-26 DIAGNOSIS — H353131 Nonexudative age-related macular degeneration, bilateral, early dry stage: Secondary | ICD-10-CM | POA: Diagnosis not present

## 2021-01-26 DIAGNOSIS — H401131 Primary open-angle glaucoma, bilateral, mild stage: Secondary | ICD-10-CM | POA: Diagnosis not present

## 2021-01-26 DIAGNOSIS — I10 Essential (primary) hypertension: Secondary | ICD-10-CM | POA: Diagnosis not present

## 2021-01-26 DIAGNOSIS — E039 Hypothyroidism, unspecified: Secondary | ICD-10-CM

## 2021-01-26 DIAGNOSIS — H26493 Other secondary cataract, bilateral: Secondary | ICD-10-CM | POA: Diagnosis not present

## 2021-02-17 ENCOUNTER — Other Ambulatory Visit: Payer: Self-pay | Admitting: Family Medicine

## 2021-02-17 NOTE — Telephone Encounter (Signed)
Requested medication (s) are due for refill today: Yes  Requested medication (s) are on the active medication list: Yes  Last refill:  05/08/20 30g/0RF  Future visit scheduled: Yes  Notes to clinic:  Unable to refill per protocol, last refill by another provider. Unable to refill per protocol, medication not assigned to the refill protocol.      Requested Prescriptions  Pending Prescriptions Disp Refills   clotrimazole-betamethasone (LOTRISONE) cream [Pharmacy Med Name: CLOTRIMAZOLE/BETAMETH CREAM] 45 g 3    Sig: APPLY TO AFFECTED AREA TWICE A DAY     Off-Protocol Failed - 02/17/2021 11:19 AM      Failed - Medication not assigned to a protocol, review manually.      Passed - Valid encounter within last 12 months    Recent Outpatient Visits           2 months ago Benign hypertensive renal disease   [facility] [place], Megan P, DO   8 months ago Hypothyroidism, unspecified type   [facility], Megan P, DO   10 months ago Hyperlipidemia, unspecified hyperlipidemia type   [facility], Megan P, DO   11 months ago Aphasia S/P CVA   [place], DO   1 year ago Routine general medical examination at a health care facility   [place], Barb Merino, DO       Future Appointments             In 2 weeks Wynetta Emery, Barb Merino, DO [place], [place]   In 2 months  [employer], [place] [facility]

## 2021-03-04 DIAGNOSIS — R002 Palpitations: Secondary | ICD-10-CM | POA: Diagnosis not present

## 2021-03-04 DIAGNOSIS — R0609 Other forms of dyspnea: Secondary | ICD-10-CM | POA: Diagnosis not present

## 2021-03-04 DIAGNOSIS — N1831 Chronic kidney disease, stage 3a: Secondary | ICD-10-CM | POA: Diagnosis not present

## 2021-03-04 DIAGNOSIS — E782 Mixed hyperlipidemia: Secondary | ICD-10-CM | POA: Diagnosis not present

## 2021-03-04 DIAGNOSIS — I779 Disorder of arteries and arterioles, unspecified: Secondary | ICD-10-CM | POA: Diagnosis not present

## 2021-03-04 DIAGNOSIS — I499 Cardiac arrhythmia, unspecified: Secondary | ICD-10-CM | POA: Diagnosis not present

## 2021-03-04 DIAGNOSIS — I10 Essential (primary) hypertension: Secondary | ICD-10-CM | POA: Diagnosis not present

## 2021-03-06 ENCOUNTER — Other Ambulatory Visit: Payer: Self-pay

## 2021-03-06 ENCOUNTER — Encounter: Payer: Self-pay | Admitting: Family Medicine

## 2021-03-06 ENCOUNTER — Ambulatory Visit (INDEPENDENT_AMBULATORY_CARE_PROVIDER_SITE_OTHER): Payer: PPO | Admitting: Family Medicine

## 2021-03-06 VITALS — BP 121/75 | HR 63 | Temp 98.2°F | Wt 144.8 lb

## 2021-03-06 DIAGNOSIS — E785 Hyperlipidemia, unspecified: Secondary | ICD-10-CM | POA: Diagnosis not present

## 2021-03-06 DIAGNOSIS — K5900 Constipation, unspecified: Secondary | ICD-10-CM | POA: Insufficient documentation

## 2021-03-06 DIAGNOSIS — E039 Hypothyroidism, unspecified: Secondary | ICD-10-CM | POA: Diagnosis not present

## 2021-03-06 DIAGNOSIS — N183 Chronic kidney disease, stage 3 unspecified: Secondary | ICD-10-CM

## 2021-03-06 DIAGNOSIS — Z0000 Encounter for general adult medical examination without abnormal findings: Secondary | ICD-10-CM | POA: Diagnosis not present

## 2021-03-06 DIAGNOSIS — I129 Hypertensive chronic kidney disease with stage 1 through stage 4 chronic kidney disease, or unspecified chronic kidney disease: Secondary | ICD-10-CM | POA: Diagnosis not present

## 2021-03-06 LAB — URINALYSIS, ROUTINE W REFLEX MICROSCOPIC
Bilirubin, UA: NEGATIVE
Glucose, UA: NEGATIVE
Ketones, UA: NEGATIVE
Nitrite, UA: NEGATIVE
Protein,UA: NEGATIVE
RBC, UA: NEGATIVE
Specific Gravity, UA: 1.015 (ref 1.005–1.030)
Urobilinogen, Ur: 0.2 mg/dL (ref 0.2–1.0)
pH, UA: 7 (ref 5.0–7.5)

## 2021-03-06 LAB — MICROSCOPIC EXAMINATION: Epithelial Cells (non renal): NONE SEEN /hpf (ref 0–10)

## 2021-03-06 LAB — MICROALBUMIN, URINE WAIVED
Creatinine, Urine Waived: 50 mg/dL (ref 10–300)
Microalb, Ur Waived: 10 mg/L (ref 0–19)
Microalb/Creat Ratio: <30 mg/g (ref <30)

## 2021-03-06 NOTE — Assessment & Plan Note (Signed)
Rechecking labs today. Await results. Treat as needed.  °

## 2021-03-06 NOTE — Assessment & Plan Note (Signed)
Under good control on current regimen. Continue current regimen. Continue to monitor. Call with any concerns. Refills given. Labs drawn today.   

## 2021-03-06 NOTE — Progress Notes (Signed)
BP 121/75   Pulse 63   Temp 98.2 F (36.8 C)   Wt 144 lb 12.8 oz (65.7 kg)   LMP 05/13/1980   SpO2 98%   BMI 24.84 kg/m    Subjective:    Patient ID: Cassie Brewer, female    DOB: 1929-02-16, 87 y.o.   MRN: [id number]  HPI: Cassie Brewer is a 87 y.o. female presenting on 03/06/2021 for comprehensive medical examination. Current medical complaints include:  HYPERTENSION / HYPERLIPIDEMIA Satisfied with current treatment? yes Duration of hypertension: chronic BP monitoring frequency: not checking BP medication side effects: no Past BP meds: benazepril, HCTZ Duration of hyperlipidemia: chronic Cholesterol medication side effects: no Cholesterol supplements: fish oil Past cholesterol medications: atorvastatin Medication compliance: excellent compliance Aspirin: yes Recent stressors: no Recurrent headaches: no Visual changes: no Palpitations: no Dyspnea: no Chest pain: no Lower extremity edema: no Dizzy/lightheaded: no  HYPOTHYROIDISM Thyroid control status:controlled Satisfied with current treatment? yes Medication side effects: no Medication compliance: excellent compliance Recent dose adjustment:no Fatigue: no Cold intolerance: no Heat intolerance: no Weight gain: no Weight loss: no Constipation: no Diarrhea/loose stools: no Palpitations: no Lower extremity edema: no Anxiety/depressed mood: no  Menopausal Symptoms: no  Depression Screen done today and results listed below:  Depression screen [facility] 2/9 03/06/2021 01/23/2021 04/28/2020 11/29/2019 11/27/2018  Decreased Interest 0 0 0 1 1  Down, Depressed, Hopeless 0 0 0 0 0  PHQ - 2 Score 0 0 0 1 1  Altered sleeping 0 - - 0 0  Tired, decreased energy 1 - - 1 2  Change in appetite 0 - - 0 0  Feeling bad or failure about yourself  0 - - 0 0  Trouble concentrating 0 - - 1 0  Moving slowly or fidgety/restless 0 - - 0 0  Suicidal thoughts 0 - - 0 0  PHQ-9 Score 1 - - 3 3  Difficult doing work/chores - - - Not  difficult at all Not difficult at all    Past Medical History:  Past Medical History:  Diagnosis Date   Glaucoma    Hyperlipidemia    Hypertension    Hypothyroidism 10/02/2014   Overactive bladder    Squamous cell skin cancer, face     Surgical History:  Past Surgical History:  Procedure Laterality Date   BREAST SURGERY     THYROIDECTOMY, PARTIAL Right    TOTAL ABDOMINAL HYSTERECTOMY      Medications:  Current Outpatient Medications on File Prior to Visit  Medication Sig   aspirin EC 81 MG EC tablet Take 1 tablet (81 mg total) by mouth daily. Swallow whole.   atorvastatin (LIPITOR) 20 MG tablet Take 1 tablet (20 mg total) by mouth daily.   benazepril (LOTENSIN) 40 MG tablet Take 1 tablet (40 mg total) by mouth daily.   Calcium Carbonate-Vit D-Min (CALCIUM 600+D PLUS MINERALS) 600-400 MG-UNIT TABS Take 1 tablet by mouth as directed.   clotrimazole-betamethasone (LOTRISONE) cream Apply 1 application topically 2 (two) times daily.   fluticasone (FLONASE) 50 MCG/ACT nasal spray Place 2 sprays into both nostrils daily as needed for allergies.   hydrochlorothiazide (HYDRODIURIL) 25 MG tablet Take 1 tablet (25 mg total) by mouth daily.   hydrocortisone (ANUSOL-HC) 2.5 % rectal cream Place 1 application rectally 2 (two) times daily.   latanoprost (XALATAN) 0.005 % ophthalmic solution Place 1 drop into both eyes at bedtime.   levothyroxine (SYNTHROID) 112 MCG tablet Take 1 tablet (112 mcg total) by mouth daily before breakfast.  Multiple Vitamins-Minerals (CENTRUM SILVER ULTRA WOMENS PO) Take 1 tablet by mouth daily.   Omega-3 Fatty Acids (FISH OIL) 1000 MG CAPS Take 1,000 mg by mouth daily.   polyethylene glycol powder (GLYCOLAX/MIRALAX) 17 GM/SCOOP powder Take 17 g by mouth 2 (two) times daily as needed.   timolol (TIMOPTIC) 0.5 % ophthalmic solution Place 1 drop into the left eye daily.   vitamin C (ASCORBIC ACID) 500 MG tablet Take 500 mg by mouth daily.   No current  facility-administered medications on file prior to visit.    Allergies:  Allergies  Allergen Reactions   Amoxicillin     Other reaction(s): Other (See Comments) "blood pressure very high - almost passed out"   Eryc [Erythromycin] Other (See Comments)   Sulfa Antibiotics Nausea And Vomiting    Nausea with bactrim in 2017  Nausea with bactrim in 2017     Social History:  Social History   Socioeconomic History   Marital status: Widowed    Spouse name: Not on file   Number of children: Not on file   Years of education: Not on file   Highest education level: Some college, no degree  Occupational History   Not on file  Tobacco Use   Smoking status: Never   Smokeless tobacco: Never  Vaping Use   Vaping Use: Never used  Substance and Sexual Activity   Alcohol use: No   Drug use: No   Sexual activity: Not Currently    Birth control/protection: Surgical  Other Topics Concern   Not on file  Social History Narrative   Not on file   Social Determinants of Health   Financial Resource Strain: Low Risk    Difficulty of Paying Living Expenses: Not hard at all  Food Insecurity: No Food Insecurity   Worried About Charity fundraiser in the Last Year: Never true   [place] in the Last Year: Never true  Transportation Needs: No Transportation Needs   Lack of Transportation (Medical): No   Lack of Transportation (Non-Medical): No  Physical Activity: Sufficiently Active   Days of Exercise per Week: 4 days   Minutes of Exercise per Session: 40 min  Stress: No Stress Concern Present   Feeling of Stress : Not at all  Social Connections: Moderately Integrated   Frequency of Communication with Friends and Family: More than three times a week   Frequency of Social Gatherings with Friends and Family: More than three times a week   Attends Religious Services: More than 4 times per year   Active Member of [employer] or Organizations: Yes   Attends Archivist Meetings: More  than 4 times per year   Marital Status: Widowed  Human resources officer Violence: Not At Risk   Fear of Current or Ex-Partner: No   Emotionally Abused: No   Physically Abused: No   Sexually Abused: No   Social History   Tobacco Use  Smoking Status Never  Smokeless Tobacco Never   Social History   Substance and Sexual Activity  Alcohol Use No    Family History:  Family History  Problem Relation Age of Onset   Hypertension Mother    Diabetes Father    Colon cancer Father    Breast cancer Grandchild     Past medical history, surgical history, medications, allergies, family history and social history reviewed with patient today and changes made to appropriate areas of the chart.   Review of Systems  Constitutional: Negative.   HENT:  Negative.         Runny nose  Eyes: Negative.   Respiratory:  Positive for cough. Negative for hemoptysis, sputum production, shortness of breath and wheezing.   Cardiovascular: Negative.   Gastrointestinal:  Positive for constipation. Negative for abdominal pain, blood in stool, diarrhea, heartburn, melena, nausea and vomiting.  Genitourinary: Negative.   Musculoskeletal: Negative.   Skin:  Positive for itching. Negative for rash.  Neurological: Negative.   Endo/Heme/Allergies: Negative.   Psychiatric/Behavioral: Negative.    All other ROS negative except what is listed above and in the HPI.      Objective:    BP 121/75   Pulse 63   Temp 98.2 F (36.8 C)   Wt 144 lb 12.8 oz (65.7 kg)   LMP 05/13/1980   SpO2 98%   BMI 24.84 kg/m   Wt Readings from Last 3 Encounters:  03/06/21 144 lb 12.8 oz (65.7 kg)  11/28/20 143 lb (64.9 kg)  05/28/20 149 lb 6.4 oz (67.8 kg)    Physical Exam Vitals and nursing note reviewed.  Constitutional:      General: She is not in acute distress.    Appearance: Normal appearance. She is not ill-appearing, toxic-appearing or diaphoretic.  HENT:     Head: Normocephalic and atraumatic.     Right Ear:  Tympanic membrane, ear canal and external ear normal. There is no impacted cerumen.     Left Ear: Tympanic membrane, ear canal and external ear normal. There is no impacted cerumen.     Nose: Nose normal. No congestion or rhinorrhea.     Mouth/Throat:     Mouth: Mucous membranes are moist.     Pharynx: Oropharynx is clear. No oropharyngeal exudate or posterior oropharyngeal erythema.  Eyes:     General: No scleral icterus.       Right eye: No discharge.        Left eye: No discharge.     Extraocular Movements: Extraocular movements intact.     Conjunctiva/sclera: Conjunctivae normal.     Pupils: Pupils are equal, round, and reactive to light.  Neck:     Vascular: No carotid bruit.  Cardiovascular:     Rate and Rhythm: Normal rate and regular rhythm.     Pulses: Normal pulses.     Heart sounds: No murmur heard.   No friction rub. No gallop.  Pulmonary:     Effort: Pulmonary effort is normal. No respiratory distress.     Breath sounds: Normal breath sounds. No stridor. No wheezing, rhonchi or rales.  Chest:     Chest wall: No tenderness.  Abdominal:     General: Abdomen is flat. Bowel sounds are normal. There is no distension.     Palpations: Abdomen is soft. There is no mass.     Tenderness: There is no abdominal tenderness. There is no right CVA tenderness, left CVA tenderness, guarding or rebound.     Hernia: No hernia is present.  Genitourinary:    Comments: Breast and pelvic exams deferred with shared decision making Musculoskeletal:        General: No swelling, tenderness, deformity or signs of injury. Normal range of motion.     Cervical back: Normal range of motion and neck supple. No rigidity. No muscular tenderness.     Right lower leg: No edema.     Left lower leg: No edema.  Lymphadenopathy:     Cervical: No cervical adenopathy.  Skin:    General: Skin is warm and dry.  Capillary Refill: Capillary refill takes less than 2 seconds.     Coloration: Skin is not  jaundiced or pale.     Findings: No bruising, erythema, lesion or rash.  Neurological:     General: No focal deficit present.     Mental Status: She is alert and oriented to person, place, and time. Mental status is at baseline.     Cranial Nerves: No cranial nerve deficit.     Sensory: No sensory deficit.     Motor: No weakness.     Coordination: Coordination normal.     Gait: Gait normal.     Deep Tendon Reflexes: Reflexes normal.  Psychiatric:        Mood and Affect: Mood normal.        Behavior: Behavior normal.        Thought Content: Thought content normal.        Judgment: Judgment normal.    Results for orders placed or performed in visit on 11/28/20  TSH  Result Value Ref Range   TSH 0.635 0.450 - 4.500 uIU/mL  Lipid Panel w/o Chol/HDL Ratio  Result Value Ref Range   Cholesterol, Total 170 100 - 199 mg/dL   Triglycerides 55 0 - 149 mg/dL   HDL 76 >39 mg/dL   VLDL Cholesterol Cal 11 5 - 40 mg/dL   LDL Chol Calc (NIH) 83 0 - 99 mg/dL  CBC with Differential/Platelet  Result Value Ref Range   WBC 5.6 3.4 - 10.8 x10E3/uL   RBC 4.16 3.77 - 5.28 x10E6/uL   Hemoglobin 12.3 11.1 - 15.9 g/dL   Hematocrit 35.7 34.0 - 46.6 %   MCV 86 79 - 97 fL   MCH 29.6 26.6 - 33.0 pg   MCHC 34.5 31.5 - 35.7 g/dL   RDW 13.0 11.7 - 15.4 %   Platelets 314 150 - 450 x10E3/uL   Neutrophils 62 Not Estab. %   Lymphs 21 Not Estab. %   Monocytes 11 Not Estab. %   Eos 5 Not Estab. %   Basos 1 Not Estab. %   Neutrophils Absolute 3.5 1.4 - 7.0 x10E3/uL   Lymphocytes Absolute 1.2 0.7 - 3.1 x10E3/uL   Monocytes Absolute 0.6 0.1 - 0.9 x10E3/uL   EOS (ABSOLUTE) 0.3 0.0 - 0.4 x10E3/uL   Basophils Absolute 0.1 0.0 - 0.2 x10E3/uL   Immature Granulocytes 0 Not Estab. %   Immature Grans (Abs) 0.0 0.0 - 0.1 x10E3/uL  Comprehensive metabolic panel  Result Value Ref Range   Glucose 102 (H) 65 - 99 mg/dL   BUN 13 10 - 36 mg/dL   Creatinine, Ser 0.95 0.57 - 1.00 mg/dL   eGFR 57 (L) >59 mL/min/1.73    BUN/Creatinine Ratio 14 12 - 28   Sodium 138 134 - 144 mmol/L   Potassium 4.4 3.5 - 5.2 mmol/L   Chloride 100 96 - 106 mmol/L   CO2 24 20 - 29 mmol/L   Calcium 9.7 8.7 - 10.3 mg/dL   Total Protein 6.8 6.0 - 8.5 g/dL   Albumin 4.5 3.5 - 4.6 g/dL   Globulin, Total 2.3 1.5 - 4.5 g/dL   Albumin/Globulin Ratio 2.0 1.2 - 2.2   Bilirubin Total 0.6 0.0 - 1.2 mg/dL   Alkaline Phosphatase 63 44 - 121 IU/L   AST 18 0 - 40 IU/L   ALT 16 0 - 32 IU/L      Assessment & Plan:   Problem List Items Addressed This Visit  Endocrine   Hypothyroidism    Rechecking labs today. Await results. Treat as needed.       Relevant Orders   Comprehensive metabolic panel   CBC with Differential/Platelet   TSH     Genitourinary   Benign hypertensive renal disease    Under good control on current regimen. Continue current regimen. Continue to monitor. Call with any concerns. Refills given. Labs drawn today.        Relevant Orders   Comprehensive metabolic panel   CBC with Differential/Platelet   Microalbumin, Urine Waived   CKD (chronic kidney disease), stage III ([place])    Rechecking labs today. Await results. Treat as needed.       Relevant Orders   Comprehensive metabolic panel   CBC with Differential/Platelet   Urinalysis, Routine w reflex microscopic     Other   Hyperlipidemia    Under good control on current regimen. Continue current regimen. Continue to monitor. Call with any concerns. Refills given. Labs drawn today.        Relevant Orders   Comprehensive metabolic panel   CBC with Differential/Platelet   Lipid Panel w/o Chol/HDL Ratio   Constipation    Soft stools. Doing well with miralax. Having some issues with evacuation, will get her into pelvic PT. Call with any concerns.       Relevant Orders   Ambulatory referral to Physical Therapy   Other Visit Diagnoses     Routine general medical examination at a health care facility    -  Primary   Vaccines up to date.  Screening labs checked today. DEXA up to date. Continue diet and exercise. Call with any concerns. Continue to monitor.         Follow up plan: Return in about 3 months (around 06/04/2021).   LABORATORY TESTING:  - Pap smear: not applicable  IMMUNIZATIONS:   - Tdap: Tetanus vaccination status reviewed: last tetanus booster within 10 years. - Influenza: Up to date - Pneumovax: Up to date - Prevnar: Up to date - COVID: Up to date - HPV: Not applicable - Shingrix vaccine: Not applicable  SCREENING: -Mammogram: Not applicable  - Colonoscopy: Not applicable  - Bone Density: Up to date   PATIENT COUNSELING:   Advised to take 1 mg of folate supplement per day if capable of pregnancy.   Sexuality: Discussed sexually transmitted diseases, partner selection, use of condoms, avoidance of unintended pregnancy  and contraceptive alternatives.   Advised to avoid cigarette smoking.  I discussed with the patient that most people either abstain from alcohol or drink within safe limits (<=14/week and <=4 drinks/occasion for males, <=7/weeks and <= 3 drinks/occasion for females) and that the risk for alcohol disorders and other health effects rises proportionally with the number of drinks per week and how often a drinker exceeds daily limits.  Discussed cessation/primary prevention of drug use and availability of treatment for abuse.   Diet: Encouraged to adjust caloric intake to maintain  or achieve ideal body weight, to reduce intake of dietary saturated fat and total fat, to limit sodium intake by avoiding high sodium foods and not adding table salt, and to maintain adequate dietary potassium and calcium preferably from fresh fruits, vegetables, and low-fat dairy products.    stressed the importance of regular exercise  Injury prevention: Discussed safety belts, safety helmets, smoke detector, smoking near bedding or upholstery.   Dental health: Discussed importance of regular tooth  brushing, flossing, and dental visits.    NEXT PREVENTATIVE  PHYSICAL DUE IN 1 YEAR. Return in about 3 months (around 06/04/2021).

## 2021-03-06 NOTE — Assessment & Plan Note (Signed)
Soft stools. Doing well with miralax. Having some issues with evacuation, will get her into pelvic PT. Call with any concerns.

## 2021-03-07 LAB — COMPREHENSIVE METABOLIC PANEL
ALT: 14 IU/L (ref 0–32)
AST: 18 IU/L (ref 0–40)
Albumin/Globulin Ratio: 1.9 (ref 1.2–2.2)
Albumin: 4.5 g/dL (ref 3.5–4.6)
Alkaline Phosphatase: 63 IU/L (ref 44–121)
BUN/Creatinine Ratio: 25 (ref 12–28)
BUN: 23 mg/dL (ref 10–36)
Bilirubin Total: 0.5 mg/dL (ref 0.0–1.2)
CO2: 25 mmol/L (ref 20–29)
Calcium: 9.6 mg/dL (ref 8.7–10.3)
Chloride: 101 mmol/L (ref 96–106)
Creatinine, Ser: 0.93 mg/dL (ref 0.57–1.00)
Globulin, Total: 2.4 g/dL (ref 1.5–4.5)
Glucose: 103 mg/dL — ABNORMAL HIGH (ref 70–99)
Potassium: 4 mmol/L (ref 3.5–5.2)
Sodium: 140 mmol/L (ref 134–144)
Total Protein: 6.9 g/dL (ref 6.0–8.5)
eGFR: 58 mL/min/{1.73_m2} — ABNORMAL LOW (ref >59)

## 2021-03-07 LAB — CBC WITH DIFFERENTIAL/PLATELET
Basophils Absolute: 0.1 10*3/uL (ref 0.0–0.2)
Basos: 1 %
EOS (ABSOLUTE): 0.2 10*3/uL (ref 0.0–0.4)
Eos: 4 %
Hematocrit: 34.8 % (ref 34.0–46.6)
Hemoglobin: 11.8 g/dL (ref 11.1–15.9)
Immature Grans (Abs): 0 10*3/uL (ref 0.0–0.1)
Immature Granulocytes: 0 %
Lymphocytes Absolute: 1.2 10*3/uL (ref 0.7–3.1)
Lymphs: 23 %
MCH: 29.5 pg (ref 26.6–33.0)
MCHC: 33.9 g/dL (ref 31.5–35.7)
MCV: 87 fL (ref 79–97)
Monocytes Absolute: 0.5 10*3/uL (ref 0.1–0.9)
Monocytes: 10 %
Neutrophils Absolute: 3.3 10*3/uL (ref 1.4–7.0)
Neutrophils: 62 %
Platelets: 320 10*3/uL (ref 150–450)
RBC: 4 x10E6/uL (ref 3.77–5.28)
RDW: 12.8 % (ref 11.7–15.4)
WBC: 5.2 10*3/uL (ref 3.4–10.8)

## 2021-03-07 LAB — TSH: TSH: 0.909 u[IU]/mL (ref 0.450–4.500)

## 2021-03-07 LAB — LIPID PANEL W/O CHOL/HDL RATIO
Cholesterol, Total: 171 mg/dL (ref 100–199)
HDL: 77 mg/dL (ref >39)
LDL Chol Calc (NIH): 82 mg/dL (ref 0–99)
Triglycerides: 60 mg/dL (ref 0–149)
VLDL Cholesterol Cal: 12 mg/dL (ref 5–40)

## 2021-03-08 ENCOUNTER — Encounter: Payer: Self-pay | Admitting: Family Medicine

## 2021-03-17 ENCOUNTER — Telehealth: Payer: PPO | Admitting: General Practice

## 2021-03-17 ENCOUNTER — Ambulatory Visit (INDEPENDENT_AMBULATORY_CARE_PROVIDER_SITE_OTHER): Payer: PPO

## 2021-03-17 DIAGNOSIS — E039 Hypothyroidism, unspecified: Secondary | ICD-10-CM

## 2021-03-17 DIAGNOSIS — E785 Hyperlipidemia, unspecified: Secondary | ICD-10-CM

## 2021-03-17 DIAGNOSIS — K5900 Constipation, unspecified: Secondary | ICD-10-CM

## 2021-03-17 DIAGNOSIS — I10 Essential (primary) hypertension: Secondary | ICD-10-CM

## 2021-03-17 NOTE — Patient Instructions (Signed)
Visit Information  Thank you for taking time to visit with me today. Please don't hesitate to contact me if I can be of assistance to you before our next scheduled telephone appointment.  Following are the goals we discussed today:  RNCM Clinical Goal(s):  Patient will verbalize understanding of plan for management of HTN, HLD, Hypothyroidism, and Constipation   verbalize basic understanding of HTN, HLD, Hypothyroidism, and Constipation  disease process and self health management plan  take all medications exactly as prescribed and will call provider for medication related questions demonstrate understanding of rationale for each prescribed medication  attend all scheduled medical appointments: 06-04-2021 at 0820 am demonstrate improved and ongoing adherence to prescribed treatment plan for HTN, HLD, Hypothyroidism, and Constipation as evidenced by adherence to prescribed medication regimen contacting provider for new or worsened symptoms or questions  demonstrate a decrease in HTN, HLD, Hypothyroidism, and constipation  exacerbations  demonstrate ongoing self health care management ability to effectively manage chronic conditions  through collaboration with RN Care manager, provider, and care team.    Interventions: 1:1 collaboration with primary care provider regarding development and update of comprehensive plan of care as evidenced by provider attestation and co-signature Inter-disciplinary care team collaboration (see longitudinal plan of care) Evaluation of current treatment plan related to  self management and patient's adherence to plan as established by provider     SDOH Barriers (Status: Goal on track: YES.) Long Term Goal (>30 days) Patient interviewed and SDOH assessment performed        SDOH Interventions     Flowsheet Row Most Recent Value  SDOH Interventions    Food Insecurity Interventions Intervention Not Indicated  Financial Strain Interventions Intervention Not Indicated   Housing Interventions Intervention Not Indicated  Intimate Partner Violence Interventions Intervention Not Indicated  Physical Activity Interventions Intervention Not Indicated, Other (Comments)  [active in her yard]  Stress Interventions Intervention Not Indicated  Social Connections Interventions Intervention Not Indicated  Transportation Interventions Intervention Not Indicated       Patient interviewed and appropriate assessments performed Provided patient with information about changes in SDOH, and resources to help with any needs. Care guides being available to assist with changes  Discussed plans with patient for ongoing care management follow up and provided patient with direct contact information for care management team Advised patient to call the office for changes in SDOH, new concerns or questions.        Hypothyroidism  (Status: Goal on track: YES.) Evaluation of current treatment plan related to  Hypothyroidism,  self-management and patient's adherence to plan as established by provider. 01-23-2021: The patient states that she is feeling more tired than usual. She is a little concerned that her thyroid levels are on the low end of normal and said the the specialist always told her that it should be on the higher end of normal. She would like to know from Dr. Wynetta Emery if her levothyroxine dose should be changed. Will collaborate with Dr. Wynetta Emery and send an inbasket message to Dr. Wynetta Emery for recommendations. Reviewed the results with the patient and confirmed that she is taking levothyroxine 112 mcg daily as prescribed. 03-17-2021: The patient states she is doing well. Her thyroid level was normal at recent appointment. The patient states she feels great and denies any new concerns with Thyroid health and well being.   Discussed plans with patient for ongoing care management follow up and provided patient with direct contact information for care management team Advised patient to  call the office for changes in condition, questions, or concerns; Provided education to patient re: thyroid level being in normal range.  The patient is concerned it is on the low end of normal and was told before it needed to be on the higher range. ; Reviewed medications with patient and discussed compliance. The patient is taking levothyroxine 112 mcg as prescribed; Provided patient with tsh educational materials related to hypothyroidism ; Reviewed scheduled/upcoming provider appointments including 06-04-2021 at [place] am, the patient is going in the office today to get her flu vaccine; Discussed plans with patient for ongoing care management follow up and provided patient with direct contact information for care management team; Screening for signs and symptoms of depression related to chronic disease state;  Assessed social determinant of health barriers;    Hyperlipidemia:  (Status: Goal on track: YES.)      Lab Results  Component Value Date    CHOL 171 03/06/2021    HDL 77 03/06/2021    LDLCALC 82 03/06/2021    TRIG 60 03/06/2021    CHOLHDL 3.3 03/11/2020      Medication review performed; medication list updated in electronic medical record.  Provider established cholesterol goals reviewed. 03-17-2021: The patient is doing well and denies any acute findings with HLD and heart health. Will continue to monitor for changes in conditions. ; Counseled on importance of regular laboratory monitoring as prescribed; Provided HLD educational materials; Reviewed role and benefits of statin for ASCVD risk reduction; Discussed strategies to manage statin-induced myalgias; Reviewed importance of limiting foods high in cholesterol;   Hypertension: (Status: Goal on track: YES.) Last practice recorded BP readings:     BP Readings from Last 3 Encounters:  03/06/21 121/75  11/28/20 132/80  05/28/20 (!) 145/77  Most recent eGFR/CrCl:       Lab Results  Component Value Date    EGFR 57 (L)  11/28/2020    No components found for: CRCL   Evaluation of current treatment plan related to hypertension self management and patient's adherence to plan as established by provider. 03-17-2021: Blood pressures more stable. The patient saw pcp recently and receive a good report. Denies any acute findings and states she is eating well and feeling good.    Provided education to patient re: stroke prevention, s/s of heart attack and stroke; Reviewed prescribed diet Heart Healthy. 03-17-2021: The patient is compliant with a heart healthy diet Reviewed medications with patient and discussed importance of compliance. 03-17-2021: The patient is compliant with a heart healthy diet;  Discussed plans with patient for ongoing care management follow up and provided patient with direct contact information for care management team; Advised patient, providing education and rationale, to monitor blood pressure daily and record, calling PCP for findings outside established parameters;  Reviewed scheduled/upcoming provider appointments including: 06-04-2021 at 0820 am Provided education on prescribed diet Heart healthy;  Discussed complications of poorly controlled blood pressure such as heart disease, stroke, circulatory complications, vision complications, kidney impairment, sexual dysfunction;      Bowel concerns/constipation  (Status: Goal on Track (progressing): YES.) Long Term Goal  Evaluation of current treatment plan related to  bowel concerns/constipation ,  self-management and patient's adherence to plan as established by provider. Discussed plans with patient for ongoing care management follow up and provided patient with direct contact information for care management team Advised patient to call after the holidays the office if she has not heard from the referral for the PT to work with her for pelvic  exercises ; Provided education to patient re: monitoring for changes in bowel habits, taking medications  as directed, calling the office for questions or concerns related to bowel health and well being; Reviewed medications with patient and discussed compliance ; Reviewed scheduled/upcoming provider appointments including 06-04-2021 at 0820 am; Discussed plans with patient for ongoing care management follow up and provided patient with direct contact information for care management team; Advised patient to discuss bowel changes and concerns with provider; Screening for signs and symptoms of depression related to chronic disease state;  Assessed social determinant of health barriers;   Patient Goals/Self-Care Activities: Patient will self administer medications as prescribed as evidenced by self report/primary caregiver report  Patient will attend all scheduled provider appointments as evidenced by clinician review of documented attendance to scheduled appointments and patient/caregiver report Patient will call pharmacy for medication refills as evidenced by patient report and review of pharmacy fill history as appropriate Patient will attend church or other social activities as evidenced by patient report Patient will continue to perform ADL's independently as evidenced by patient/caregiver report Patient will continue to perform IADL's independently as evidenced by patient/caregiver report Patient will call provider office for new concerns or questions as evidenced by review of documented incoming telephone call notes and patient report Patient will work with BSW to address care coordination needs and will continue to work with the clinical team to address health care and disease management related needs as evidenced by documented adherence to scheduled care management/care coordination appointments - check blood pressure 3 times per week - choose a place to take my blood pressure (home, clinic or office, retail store) - write blood pressure results in a log or diary - learn about high blood  pressure - keep a blood pressure log - take blood pressure log to all doctor appointments - call doctor for signs and symptoms of high blood pressure - develop an action plan for high blood pressure - keep all doctor appointments - take medications for blood pressure exactly as prescribed - report new symptoms to your doctor - eat more whole grains, fruits and vegetables, lean meats and healthy fats - call for medicine refill 2 or 3 days before it runs out - take all medications exactly as prescribed - call doctor with any symptoms you believe are related to your medicine - call doctor when you experience any new symptoms - go to all doctor appointments as scheduled - adhere to prescribed diet: heart Healthy       Our next appointment is by telephone on 05-26-2021 at 0945 am  Please call the care guide team at [phone] if you need to cancel or reschedule your appointment.   If you are experiencing a Mental Health or [place] or need someone to talk to, please call the Suicide and Crisis Lifeline: 988 call the [place] National Suicide Prevention Lifeline: 414-172-9285 or TTY: (831) 313-9322 TTY (581)421-5717) to talk to a trained counselor call 1-800-273-TALK (toll free, 24 hour hotline)   Patient verbalizes understanding of instructions provided today and agrees to view in [facility].   Noreene Larsson RN, MSN, [facility] Mobile: [phone]

## 2021-03-17 NOTE — Chronic Care Management (AMB) (Signed)
Chronic Care Management   CCM RN Visit Note  03/17/2021 Name: Cassie Brewer MRN: [id number] DOB: [birth year]  Subjective: Cassie Brewer is a 87 y.o. year old female who is a primary care patient of Valerie Roys, DO. The care management team was consulted for assistance with disease management and care coordination needs.    Engaged with patient by telephone for follow up visit in response to provider referral for case management and/or care coordination services.   Consent to Services:  The patient was given information about Chronic Care Management services, agreed to services, and gave verbal consent prior to initiation of services.  Please see initial visit note for detailed documentation.   Patient agreed to services and verbal consent obtained.   Assessment: Review of patient past medical history, allergies, medications, health status, including review of consultants reports, laboratory and other test data, was performed as part of comprehensive evaluation and provision of chronic care management services.   SDOH (Social Determinants of Health) assessments and interventions performed:    CCM Care Plan  Allergies  Allergen Reactions   Amoxicillin     Other reaction(s): Other (See Comments) "blood pressure very high - almost passed out"   Eryc [Erythromycin] Other (See Comments)   Sulfa Antibiotics Nausea And Vomiting    Nausea with bactrim in 2017  Nausea with bactrim in 2017     Outpatient Encounter Medications as of 03/17/2021  Medication Sig   aspirin EC 81 MG EC tablet Take 1 tablet (81 mg total) by mouth daily. Swallow whole.   atorvastatin (LIPITOR) 20 MG tablet Take 1 tablet (20 mg total) by mouth daily.   benazepril (LOTENSIN) 40 MG tablet Take 1 tablet (40 mg total) by mouth daily.   Calcium Carbonate-Vit D-Min (CALCIUM 600+D PLUS MINERALS) 600-400 MG-UNIT TABS Take 1 tablet by mouth as directed.   clotrimazole-betamethasone (LOTRISONE) cream Apply 1  application topically 2 (two) times daily.   fluticasone (FLONASE) 50 MCG/ACT nasal spray Place 2 sprays into both nostrils daily as needed for allergies.   hydrochlorothiazide (HYDRODIURIL) 25 MG tablet Take 1 tablet (25 mg total) by mouth daily.   hydrocortisone (ANUSOL-HC) 2.5 % rectal cream Place 1 application rectally 2 (two) times daily.   latanoprost (XALATAN) 0.005 % ophthalmic solution Place 1 drop into both eyes at bedtime.   levothyroxine (SYNTHROID) 112 MCG tablet Take 1 tablet (112 mcg total) by mouth daily before breakfast.   Multiple Vitamins-Minerals (CENTRUM SILVER ULTRA WOMENS PO) Take 1 tablet by mouth daily.   Omega-3 Fatty Acids (FISH OIL) 1000 MG CAPS Take 1,000 mg by mouth daily.   polyethylene glycol powder (GLYCOLAX/MIRALAX) 17 GM/SCOOP powder Take 17 g by mouth 2 (two) times daily as needed.   timolol (TIMOPTIC) 0.5 % ophthalmic solution Place 1 drop into the left eye daily.   vitamin C (ASCORBIC ACID) 500 MG tablet Take 500 mg by mouth daily.   No facility-administered encounter medications on file as of 03/17/2021.    Patient Active Problem List   Diagnosis Date Noted   Constipation 03/06/2021   Aphasia 03/21/2020   Aphasia S/P CVA 03/11/2020   TIA (transient ischemic attack) 03/10/2020   Arthritis of foot 04/24/2015   Arthropathy 04/24/2015   Benign hypertensive renal disease 10/02/2014   Hypothyroidism 10/02/2014   Hyperlipidemia 10/02/2014   CKD (chronic kidney disease), stage III ([place]) 10/02/2014    Conditions to be addressed/monitored:HTN, HLD, Hypothyroidism, and constipation, bowel issues   Care Plan : RNCM: General Plan of  Care (Adult) for Chronic Disease Manage and Care Coordination Needs  Updates made by Vanita Ingles, RN since 03/17/2021 12:00 AM     Problem: RNCM: Development of Plan of Care for Chronic Disease Management (HTN, HLD, Constipation, Hypothyroidism)   Priority: High     Long-Range Goal: RNCM: Effective Management of Plan of  Care for Chronic Disease Management (HTN, HLD, Constipation, Hypothyroidism)   Start Date: 01/23/2021  Expected End Date: 01/23/2022  Priority: High  Note:   Current Barriers:  Knowledge Deficits related to plan of care for management of HTN, HLD, and Hypothyroidism and Constipation  Chronic Disease Management support and education needs related to HTN, HLD, and Hypothyroidism and Constipation  RNCM Clinical Goal(s):  Patient will verbalize understanding of plan for management of HTN, HLD, Hypothyroidism, and Constipation   verbalize basic understanding of HTN, HLD, Hypothyroidism, and Constipation  disease process and self health management plan  take all medications exactly as prescribed and will call provider for medication related questions demonstrate understanding of rationale for each prescribed medication  attend all scheduled medical appointments: 06-04-2021 at 0820 am demonstrate improved and ongoing adherence to prescribed treatment plan for HTN, HLD, Hypothyroidism, and Constipation as evidenced by adherence to prescribed medication regimen contacting provider for new or worsened symptoms or questions  demonstrate a decrease in HTN, HLD, Hypothyroidism, and constipation  exacerbations  demonstrate ongoing self health care management ability to effectively manage chronic conditions  through collaboration with RN Care manager, provider, and care team.   Interventions: 1:1 collaboration with primary care provider regarding development and update of comprehensive plan of care as evidenced by provider attestation and co-signature Inter-disciplinary care team collaboration (see longitudinal plan of care) Evaluation of current treatment plan related to  self management and patient's adherence to plan as established by provider   SDOH Barriers (Status: Goal on track: YES.) Long Term Goal (>30 days) Patient interviewed and SDOH assessment performed        SDOH Interventions     Flowsheet Row Most Recent Value  SDOH Interventions   Food Insecurity Interventions Intervention Not Indicated  Financial Strain Interventions Intervention Not Indicated  Housing Interventions Intervention Not Indicated  Intimate Partner Violence Interventions Intervention Not Indicated  Physical Activity Interventions Intervention Not Indicated, Other (Comments)  [active in her yard]  Stress Interventions Intervention Not Indicated  Social Connections Interventions Intervention Not Indicated  Transportation Interventions Intervention Not Indicated     Patient interviewed and appropriate assessments performed Provided patient with information about changes in SDOH, and resources to help with any needs. Care guides being available to assist with changes  Discussed plans with patient for ongoing care management follow up and provided patient with direct contact information for care management team Advised patient to call the office for changes in SDOH, new concerns or questions.     Hypothyroidism  (Status: Goal on track: YES.) Evaluation of current treatment plan related to  Hypothyroidism,  self-management and patient's adherence to plan as established by provider. 01-23-2021: The patient states that she is feeling more tired than usual. She is a little concerned that her thyroid levels are on the low end of normal and said the the specialist always told her that it should be on the higher end of normal. She would like to know from Dr. Wynetta Emery if her levothyroxine dose should be changed. Will collaborate with Dr. Wynetta Emery and send an inbasket message to Dr. Wynetta Emery for recommendations. Reviewed the results with the patient and confirmed that she  is taking levothyroxine 112 mcg daily as prescribed. 03-17-2021: The patient states she is doing well. Her thyroid level was normal at recent appointment. The patient states she feels great and denies any new concerns with Thyroid health and well  being.   Discussed plans with patient for ongoing care management follow up and provided patient with direct contact information for care management team Advised patient to call the office for changes in condition, questions, or concerns; Provided education to patient re: thyroid level being in normal range.  The patient is concerned it is on the low end of normal and was told before it needed to be on the higher range. ; Reviewed medications with patient and discussed compliance. The patient is taking levothyroxine 112 mcg as prescribed; Provided patient with tsh educational materials related to hypothyroidism ; Reviewed scheduled/upcoming provider appointments including 06-04-2021 at [place] am, the patient is going in the office today to get her flu vaccine; Discussed plans with patient for ongoing care management follow up and provided patient with direct contact information for care management team; Screening for signs and symptoms of depression related to chronic disease state;  Assessed social determinant of health barriers;   Hyperlipidemia:  (Status: Goal on track: YES.) Lab Results  Component Value Date   CHOL 171 03/06/2021   HDL 77 03/06/2021   LDLCALC 82 03/06/2021   TRIG 60 03/06/2021   CHOLHDL 3.3 03/11/2020     Medication review performed; medication list updated in electronic medical record.  Provider established cholesterol goals reviewed. 03-17-2021: The patient is doing well and denies any acute findings with HLD and heart health. Will continue to monitor for changes in conditions. ; Counseled on importance of regular laboratory monitoring as prescribed; Provided HLD educational materials; Reviewed role and benefits of statin for ASCVD risk reduction; Discussed strategies to manage statin-induced myalgias; Reviewed importance of limiting foods high in cholesterol;  Hypertension: (Status: Goal on track: YES.) Last practice recorded BP readings:  BP Readings from Last 3  Encounters:  03/06/21 121/75  11/28/20 132/80  05/28/20 (!) 145/77  Most recent eGFR/CrCl:  Lab Results  Component Value Date   EGFR 57 (L) 11/28/2020    No components found for: CRCL  Evaluation of current treatment plan related to hypertension self management and patient's adherence to plan as established by provider. 03-17-2021: Blood pressures more stable. The patient saw pcp recently and receive a good report. Denies any acute findings and states she is eating well and feeling good.    Provided education to patient re: stroke prevention, s/s of heart attack and stroke; Reviewed prescribed diet Heart Healthy. 03-17-2021: The patient is compliant with a heart healthy diet Reviewed medications with patient and discussed importance of compliance. 03-17-2021: The patient is compliant with a heart healthy diet;  Discussed plans with patient for ongoing care management follow up and provided patient with direct contact information for care management team; Advised patient, providing education and rationale, to monitor blood pressure daily and record, calling PCP for findings outside established parameters;  Reviewed scheduled/upcoming provider appointments including: 06-04-2021 at 0820 am Provided education on prescribed diet Heart healthy;  Discussed complications of poorly controlled blood pressure such as heart disease, stroke, circulatory complications, vision complications, kidney impairment, sexual dysfunction;    Bowel concerns/constipation  (Status: Goal on Track (progressing): YES.) Long Term Goal  Evaluation of current treatment plan related to  bowel concerns/constipation ,  self-management and patient's adherence to plan as established by provider. Discussed plans with patient  for ongoing care management follow up and provided patient with direct contact information for care management team Advised patient to call after the holidays the office if she has not heard from the referral  for the PT to work with her for pelvic exercises ; Provided education to patient re: monitoring for changes in bowel habits, taking medications as directed, calling the office for questions or concerns related to bowel health and well being; Reviewed medications with patient and discussed compliance ; Reviewed scheduled/upcoming provider appointments including 06-04-2021 at 0820 am; Discussed plans with patient for ongoing care management follow up and provided patient with direct contact information for care management team; Advised patient to discuss bowel changes and concerns with provider; Screening for signs and symptoms of depression related to chronic disease state;  Assessed social determinant of health barriers;   Patient Goals/Self-Care Activities: Patient will self administer medications as prescribed as evidenced by self report/primary caregiver report  Patient will attend all scheduled provider appointments as evidenced by clinician review of documented attendance to scheduled appointments and patient/caregiver report Patient will call pharmacy for medication refills as evidenced by patient report and review of pharmacy fill history as appropriate Patient will attend church or other social activities as evidenced by patient report Patient will continue to perform ADL's independently as evidenced by patient/caregiver report Patient will continue to perform IADL's independently as evidenced by patient/caregiver report Patient will call provider office for new concerns or questions as evidenced by review of documented incoming telephone call notes and patient report Patient will work with BSW to address care coordination needs and will continue to work with the clinical team to address health care and disease management related needs as evidenced by documented adherence to scheduled care management/care coordination appointments - check blood pressure 3 times per week - choose a place to  take my blood pressure (home, clinic or office, retail store) - write blood pressure results in a log or diary - learn about high blood pressure - keep a blood pressure log - take blood pressure log to all doctor appointments - call doctor for signs and symptoms of high blood pressure - develop an action plan for high blood pressure - keep all doctor appointments - take medications for blood pressure exactly as prescribed - report new symptoms to your doctor - eat more whole grains, fruits and vegetables, lean meats and healthy fats - call for medicine refill 2 or 3 days before it runs out - take all medications exactly as prescribed - call doctor with any symptoms you believe are related to your medicine - call doctor when you experience any new symptoms - go to all doctor appointments as scheduled - adhere to prescribed diet: heart Healthy        Plan:Telephone follow up appointment with care management team member scheduled for:  05-26-2021 at [place] am  Noreene Larsson RN, MSN, [facility] Mobile: [phone]

## 2021-03-19 ENCOUNTER — Ambulatory Visit: Payer: PPO | Admitting: Podiatry

## 2021-03-19 ENCOUNTER — Other Ambulatory Visit: Payer: Self-pay

## 2021-03-19 DIAGNOSIS — M79674 Pain in right toe(s): Secondary | ICD-10-CM

## 2021-03-19 DIAGNOSIS — M79675 Pain in left toe(s): Secondary | ICD-10-CM

## 2021-03-19 DIAGNOSIS — B351 Tinea unguium: Secondary | ICD-10-CM | POA: Diagnosis not present

## 2021-03-19 NOTE — Progress Notes (Signed)
°  Subjective:  Patient ID: Cassie Brewer, female    DOB: 07/29/1928,  MRN: [id number]  Chief Complaint  Patient presents with   Nail Problem    Nail trim    87 y.o. female returns for the above complaint.  Patient presents with thickened elongated mycotic toenails x10.  Patient is especially concerned for right hallux onychomycosis.  Patient not able to debride on herself.  She would like for us to debride down.  She does not have any secondary complaints. Objective:   There were no vitals filed for this visit. Podiatric Exam: Vascular: dorsalis pedis and posterior tibial pulses are palpable bilateral. Capillary return is immediate. Temperature gradient is WNL. Skin turgor WNL  Sensorium: Normal Semmes Weinstein monofilament test. Normal tactile sensation bilaterally.  However she experiences subjective neuropathic pain to the distal tip of the toes.  Intact sensation Nail Exam: Pt has thick disfigured discolored nails with subungual debris noted bilateral entire nail hallux through fifth toenails.  Ingrown noted to the right hallux lateral border.  Mild pain on palpation. Ulcer Exam: There is no evidence of ulcer or pre-ulcerative changes or infection. Orthopedic Exam: Muscle tone and strength are WNL. No limitations in general ROM. No crepitus or effusions noted. HAV  B/L.  Hammer toes 2-5  B/L. Skin: No Porokeratosis. No infection or ulcers.  Xerosis/psoriasis of the plantar foot with now subjective component of itching..  There is mild cracking noted.  No bleeding noted.  Assessment & Plan:  Patient was evaluated and treated and all questions answered.  Athlete's foot bilateral -Explained to patient the etiology of athlete's foot and various treatment options were discussed.  Given that there is subjective complaint of itching associated with the bottom of the foot I believe she will benefit from Lotrisone cream.  I have asked her to apply twice a day.  She states  understanding.  Neuropathic pain -I explained to the patient the etiology of neuropathic pain and various treatment options were extensively discussed.  I believe she will benefit from neuropathic cream to help decrease the pain associated with this -Prescription for neuropathic cream was sent to [facility].  Xerosis/psoriasis -I explained to the patient the etiology of dryness with cracking as well as various treatment options associated with it.  Given that she is doing good with clotrimazole cream that was given to her by dermatologist and had diagnosed her with psoriasis, I believe she can continue using the cream.  Patient agrees with this plan.  Onychomycosis with pain  -Nails palliatively debrided as below. -Educated on self-care  Procedure: Nail Debridement Rationale: pain  Type of Debridement: manual, sharp debridement. Instrumentation: Nail nipper, rotary burr. Number of Nails: 10  Procedures and Treatment: Consent by patient was obtained for treatment procedures. The patient understood the discussion of treatment and procedures well. All questions were answered thoroughly reviewed. Debridement of mycotic and hypertrophic toenails, 1 through 5 bilateral and clearing of subungual debris. No ulceration, no infection noted.  Return Visit-Office Procedure: Patient instructed to return to the office for a follow up visit 3 months for continued evaluation and treatment.  Shuronda Santino, DPM    No follow-ups on file. 

## 2021-03-28 DIAGNOSIS — E785 Hyperlipidemia, unspecified: Secondary | ICD-10-CM

## 2021-03-28 DIAGNOSIS — E039 Hypothyroidism, unspecified: Secondary | ICD-10-CM | POA: Diagnosis not present

## 2021-03-28 DIAGNOSIS — I10 Essential (primary) hypertension: Secondary | ICD-10-CM | POA: Diagnosis not present

## 2021-03-31 ENCOUNTER — Ambulatory Visit: Payer: PPO | Admitting: Podiatry

## 2021-04-30 ENCOUNTER — Ambulatory Visit (INDEPENDENT_AMBULATORY_CARE_PROVIDER_SITE_OTHER): Payer: PPO | Admitting: *Deleted

## 2021-04-30 DIAGNOSIS — Z0000 Encounter for general adult medical examination without abnormal findings: Secondary | ICD-10-CM

## 2021-04-30 NOTE — Patient Instructions (Signed)
Cassie Brewer , Thank you for taking time to come for your Medicare Wellness Visit. I appreciate your ongoing commitment to your health goals. Please review the following plan we discussed and let me know if I can assist you in the future.   Screening recommendations/referrals: Colonoscopy: no longer required Mammogram: no longer required Bone Density: up to date Recommended yearly ophthalmology/optometry visit for glaucoma screening and checkup Recommended yearly dental visit for hygiene and checkup  Vaccinations: Influenza vaccine: up to date Pneumococcal vaccine: up to date Tdap vaccine: up to date Shingles vaccine: Education provided    Advanced directives: Education provided  Conditions/risks identified:   Next appointment: 06-04-2021  8:20  [place] 86 Years and Older, Female Preventive care refers to lifestyle choices and visits with your health care provider that can promote health and wellness. What does preventive care include? A yearly physical exam. This is also called an annual well check. Dental exams once or twice a year. Routine eye exams. Ask your health care provider how often you should have your eyes checked. Personal lifestyle choices, including: Daily care of your teeth and gums. Regular physical activity. Eating a healthy diet. Avoiding tobacco and drug use. Limiting alcohol use. Practicing safe sex. Taking low-dose aspirin every day. Taking vitamin and mineral supplements as recommended by your health care provider. What happens during an annual well check? The services and screenings done by your health care provider during your annual well check will depend on your age, overall health, lifestyle risk factors, and family history of disease. Counseling  Your health care provider may ask you questions about your: Alcohol use. Tobacco use. Drug use. Emotional well-being. Home and relationship well-being. Sexual activity. Eating  habits. History of falls. Memory and ability to understand (cognition). Work and work Statistician. Reproductive health. Screening  You may have the following tests or measurements: Height, weight, and BMI. Blood pressure. Lipid and cholesterol levels. These may be checked every 5 years, or more frequently if you are over 15 years old. Skin check. Lung cancer screening. You may have this screening every year starting at age 32 if you have a 30-pack-year history of smoking and currently smoke or have quit within the past 15 years. Fecal occult blood test (FOBT) of the stool. You may have this test every year starting at age 53. Flexible sigmoidoscopy or colonoscopy. You may have a sigmoidoscopy every 5 years or a colonoscopy every 10 years starting at age 65. Hepatitis C blood test. Hepatitis B blood test. Sexually transmitted disease (STD) testing. Diabetes screening. This is done by checking your blood sugar (glucose) after you have not eaten for a while (fasting). You may have this done every 1-3 years. Bone density scan. This is done to screen for osteoporosis. You may have this done starting at age 53. Mammogram. This may be done every 1-2 years. Talk to your health care provider about how often you should have regular mammograms. Talk with your health care provider about your test results, treatment options, and if necessary, the need for more tests. Vaccines  Your health care provider may recommend certain vaccines, such as: Influenza vaccine. This is recommended every year. Tetanus, diphtheria, and acellular pertussis (Tdap, Td) vaccine. You may need a Td booster every 10 years. Zoster vaccine. You may need this after age 55. Pneumococcal 13-valent conjugate (PCV13) vaccine. One dose is recommended after age 2. Pneumococcal polysaccharide (PPSV23) vaccine. One dose is recommended after age 52. Talk to your health care  provider about which screenings and vaccines you need and how  often you need them. This information is not intended to replace advice given to you by your health care provider. Make sure you discuss any questions you have with your health care provider. Document Released: 04/11/2015 Document Revised: 12/03/2015 Document Reviewed: 01/14/2015 Elsevier Interactive Patient Education  2017 [place] Prevention in the Home Falls can cause injuries. They can happen to people of all ages. There are many things you can do to make your home safe and to help prevent falls. What can I do on the outside of my home? Regularly fix the edges of walkways and driveways and fix any cracks. Remove anything that might make you trip as you walk through a door, such as a raised step or threshold. Trim any bushes or trees on the path to your home. Use bright outdoor lighting. Clear any walking paths of anything that might make someone trip, such as rocks or tools. Regularly check to see if handrails are loose or broken. Make sure that both sides of any steps have handrails. Any raised decks and porches should have guardrails on the edges. Have any leaves, snow, or ice cleared regularly. Use sand or salt on walking paths during winter. Clean up any spills in your garage right away. This includes oil or grease spills. What can I do in the bathroom? Use night lights. Install grab bars by the toilet and in the tub and shower. Do not use towel bars as grab bars. Use non-skid mats or decals in the tub or shower. If you need to sit down in the shower, use a plastic, non-slip stool. Keep the floor dry. Clean up any water that spills on the floor as soon as it happens. Remove soap buildup in the tub or shower regularly. Attach bath mats securely with double-sided non-slip rug tape. Do not have throw rugs and other things on the floor that can make you trip. What can I do in the bedroom? Use night lights. Make sure that you have a light by your bed that is easy to  reach. Do not use any sheets or blankets that are too big for your bed. They should not hang down onto the floor. Have a firm chair that has side arms. You can use this for support while you get dressed. Do not have throw rugs and other things on the floor that can make you trip. What can I do in the kitchen? Clean up any spills right away. Avoid walking on wet floors. Keep items that you use a lot in easy-to-reach places. If you need to reach something above you, use a strong step stool that has a grab bar. Keep electrical cords out of the way. Do not use floor polish or wax that makes floors slippery. If you must use wax, use non-skid floor wax. Do not have throw rugs and other things on the floor that can make you trip. What can I do with my stairs? Do not leave any items on the stairs. Make sure that there are handrails on both sides of the stairs and use them. Fix handrails that are broken or loose. Make sure that handrails are as long as the stairways. Check any carpeting to make sure that it is firmly attached to the stairs. Fix any carpet that is loose or worn. Avoid having throw rugs at the top or bottom of the stairs. If you do have throw rugs, attach them to the  floor with carpet tape. Make sure that you have a light switch at the top of the stairs and the bottom of the stairs. If you do not have them, ask someone to add them for you. What else can I do to help prevent falls? Wear shoes that: Do not have high heels. Have rubber bottoms. Are comfortable and fit you well. Are closed at the toe. Do not wear sandals. If you use a stepladder: Make sure that it is fully opened. Do not climb a closed stepladder. Make sure that both sides of the stepladder are locked into place. Ask someone to hold it for you, if possible. Clearly mark and make sure that you can see: Any grab bars or handrails. First and last steps. Where the edge of each step is. Use tools that help you move  around (mobility aids) if they are needed. These include: Canes. Walkers. Scooters. Crutches. Turn on the lights when you go into a dark area. Replace any light bulbs as soon as they burn out. Set up your furniture so you have a clear path. Avoid moving your furniture around. If any of your floors are uneven, fix them. If there are any pets around you, be aware of where they are. Review your medicines with your doctor. Some medicines can make you feel dizzy. This can increase your chance of falling. Ask your doctor what other things that you can do to help prevent falls. This information is not intended to replace advice given to you by your health care provider. Make sure you discuss any questions you have with your health care provider. Document Released: 01/09/2009 Document Revised: 08/21/2015 Document Reviewed: 04/19/2014 Elsevier Interactive Patient Education  2017 [employer].

## 2021-04-30 NOTE — Progress Notes (Signed)
Subjective:   Cassie Brewer is a 88 y.o. female who presents for Medicare Annual (Subsequent) preventive examination.  I connected with  Erling Cruz on 04/30/21 by a telephone enabled telemedicine application and verified that I am speaking with the correct person using two identifiers.   I discussed the limitations of evaluation and management by telemedicine. The patient expressed understanding and agreed to proceed.  Patient location: home  Provider location: [place] not in office    Review of Systems     Cardiac Risk Factors include: advanced age (>2men, >67 women)     Objective:    Today's Vitals   There is no height or weight on file to calculate BMI.  Advanced Directives 04/30/2021 04/28/2020 03/25/2020 03/10/2020 03/10/2020 11/16/2018 11/14/2017  Does Patient Have a Medical Advance Directive? Yes Yes No No No Yes Yes  Type of Industrial/product designer of [place];Living will - - - Living will;Healthcare Power of [place];Living will  Copy of [place] in Chart? No - copy requested No - copy requested - - - No - copy requested No - copy requested  Would patient like information on creating a medical advance directive? - - - - No - Patient declined - -    Current Medications (verified) Outpatient Encounter Medications as of 04/30/2021  Medication Sig   aspirin EC 81 MG EC tablet Take 1 tablet (81 mg total) by mouth daily. Swallow whole.   atorvastatin (LIPITOR) 20 MG tablet Take 1 tablet (20 mg total) by mouth daily.   benazepril (LOTENSIN) 40 MG tablet Take 1 tablet (40 mg total) by mouth daily.   Calcium Carbonate-Vit D-Min (CALCIUM 600+D PLUS MINERALS) 600-400 MG-UNIT TABS Take 1 tablet by mouth as directed.   clotrimazole-betamethasone (LOTRISONE) cream Apply 1 application topically 2 (two) times daily.   fluticasone (FLONASE) 50 MCG/ACT nasal spray Place 2 sprays into both  nostrils daily as needed for allergies.   hydrochlorothiazide (HYDRODIURIL) 25 MG tablet Take 1 tablet (25 mg total) by mouth daily.   hydrocortisone (ANUSOL-HC) 2.5 % rectal cream Place 1 application rectally 2 (two) times daily.   latanoprost (XALATAN) 0.005 % ophthalmic solution Place 1 drop into both eyes at bedtime.   levothyroxine (SYNTHROID) 112 MCG tablet Take 1 tablet (112 mcg total) by mouth daily before breakfast.   Multiple Vitamins-Minerals (CENTRUM SILVER ULTRA WOMENS PO) Take 1 tablet by mouth daily.   Omega-3 Fatty Acids (FISH OIL) 1000 MG CAPS Take 1,000 mg by mouth daily.   polyethylene glycol powder (GLYCOLAX/MIRALAX) 17 GM/SCOOP powder Take 17 g by mouth 2 (two) times daily as needed.   timolol (TIMOPTIC) 0.5 % ophthalmic solution Place 1 drop into the left eye daily.   vitamin C (ASCORBIC ACID) 500 MG tablet Take 500 mg by mouth daily.   No facility-administered encounter medications on file as of 04/30/2021.    Allergies (verified) Amoxicillin, Eryc [erythromycin], and Sulfa antibiotics   History: Past Medical History:  Diagnosis Date   Glaucoma    Hyperlipidemia    Hypertension    Hypothyroidism 10/02/2014   Overactive bladder    Squamous cell skin cancer, face    Past Surgical History:  Procedure Laterality Date   BREAST SURGERY     THYROIDECTOMY, PARTIAL Right    TOTAL ABDOMINAL HYSTERECTOMY     Family History  Problem Relation Age of Onset   Hypertension Mother    Diabetes Father  Colon cancer Father    Breast cancer Grandchild    Social History   Socioeconomic History   Marital status: Widowed    Spouse name: Not on file   Number of children: Not on file   Years of education: Not on file   Highest education level: Some college, no degree  Occupational History   Not on file  Tobacco Use   Smoking status: Never   Smokeless tobacco: Never  Vaping Use   Vaping Use: Never used  Substance and Sexual Activity   Alcohol use: No   Drug use: No    Sexual activity: Not Currently    Birth control/protection: Surgical  Other Topics Concern   Not on file  Social History Narrative   Not on file   Social Determinants of Health   Financial Resource Strain: Low Risk    Difficulty of Paying Living Expenses: Not hard at all  Food Insecurity: No Food Insecurity   Worried About Charity fundraiser in the Last Year: Never true   [place] in the Last Year: Never true  Transportation Needs: No Transportation Needs   Lack of Transportation (Medical): No   Lack of Transportation (Non-Medical): No  Physical Activity: Insufficiently Active   Days of Exercise per Week: 2 days   Minutes of Exercise per Session: 20 min  Stress: No Stress Concern Present   Feeling of Stress : Not at all  Social Connections: Moderately Integrated   Frequency of Communication with Friends and Family: More than three times a week   Frequency of Social Gatherings with Friends and Family: More than three times a week   Attends Religious Services: More than 4 times per year   Active Member of [employer] or Organizations: Yes   Attends Archivist Meetings: More than 4 times per year   Marital Status: Widowed    Tobacco Counseling Counseling given: Not Answered   Clinical Intake:  Pre-visit preparation completed: Yes  Pain : No/denies pain     Nutritional Risks: None Diabetes: No  How often do you need to have someone help you when you read instructions, pamphlets, or other written materials from your doctor or pharmacy?: 1 - Never  Diabetic?  no  Interpreter Needed?: No  Information entered by :: Leroy Kennedy LPN   Activities of Daily Living In your present state of health, do you have any difficulty performing the following activities: 04/30/2021  Hearing? N  Vision? N  Difficulty concentrating or making decisions? N  Walking or climbing stairs? N  Dressing or bathing? N  Doing errands, shopping? N  Preparing Food and eating ? N   Using the Toilet? N  In the past six months, have you accidently leaked urine? N  Do you have problems with loss of bowel control? N  Managing your Medications? N  Managing your Finances? N  Housekeeping or managing your Housekeeping? N  Some recent data might be hidden    Patient Care Team: Valerie Roys, DO as PCP - General (Family Medicine) Oneta Rack, MD (Dermatology) Sharlotte [place], [place] (Podiatry) Samara Deist, DPM as Referring Physician (Podiatry) Vanita Ingles, RN as Case Manager (General Practice)  Indicate any recent Medical Services you may have received from other than [facility] providers in the past year (date may be approximate).     Assessment:   This is a routine wellness examination for Jonnette.  Hearing/Vision screen Hearing Screening - Comments:: Some trouble hearing No hearing Vision  Screening - Comments:: Up to date [facility]  Dietary issues and exercise activities discussed: Current Exercise Habits: Home exercise routine, Type of exercise: stretching (thia chi), Time (Minutes): 25, Frequency (Times/Week): 4, Weekly Exercise (Minutes/Week): 100, Intensity: Mild   Goals Addressed             This Visit's Progress    Patient Stated       Continue current lifestyle       Depression Screen PHQ 2/9 Scores 04/30/2021 03/06/2021 01/23/2021 04/28/2020 11/29/2019 11/27/2018 11/16/2018  PHQ - 2 Score 0 0 0 0 1 1 1   PHQ- 9 Score 0 1 - - 3 3 -    Fall Risk Fall Risk  04/30/2021 03/06/2021 11/28/2020 04/28/2020 11/29/2019  Falls in the past year? 0 0 0 0 0  Number falls in past yr: 0 0 0 - 0  Injury with Fall? 0 0 0 - 0  Risk for fall due to : - No Fall Risks No Fall Risks Medication side effect -  Follow up Falls evaluation completed;Falls prevention discussed Falls evaluation completed Falls evaluation completed Falls evaluation completed;Education provided;Falls prevention discussed -    FALL RISK PREVENTION PERTAINING TO THE HOME:  Any stairs in or around the  home? No  If so, are there any without handrails? No  Home free of loose throw rugs in walkways, pet beds, electrical cords, etc? Yes  Adequate lighting in your home to reduce risk of falls? Yes   ASSISTIVE DEVICES UTILIZED TO PREVENT FALLS:  Life alert? Yes  Use of a cane, walker or w/c? No  Grab bars in the bathroom? Yes  Shower chair or bench in shower? No  Elevated toilet seat or a handicapped toilet? Yes   TIMED UP AND GO:  Was the test performed? No .    Cognitive Function:  Normal cognitive status assessed by direct observation by this Nurse Health Advisor. No abnormalities found.       6CIT Screen 04/28/2020 11/14/2017 11/03/2016  What Year? 0 points 0 points 0 points  What month? 0 points 0 points 0 points  What time? 0 points 0 points 0 points  Count back from 20 0 points 0 points 0 points  Months in reverse 0 points 0 points 0 points  Repeat phrase 0 points 0 points 0 points  Total Score 0 0 0    Immunizations Immunization History  Administered Date(s) Administered   Fluad Quad(high Dose 65+) 11/27/2018, 11/29/2019, 01/23/2021   Influenza, High Dose Seasonal PF 01/30/2016, 05/13/2017, 11/24/2017   Influenza,inj,Quad PF,6+ Mos 01/21/2015   Influenza-Unspecified 11/24/2017   PFIZER(Purple Top)SARS-COV-2 Vaccination 04/20/2019, 05/11/2019, 03/18/2020   Pneumococcal Conjugate-13 10/01/2013   Pneumococcal Polysaccharide-23 06/06/2006, 09/28/2006   Td 09/28/2006, 11/10/2016    TDAP status: Up to date  Flu Vaccine status: Up to date  Pneumococcal vaccine status: Up to date  Covid-19 vaccine status: Information provided on how to obtain vaccines.   Qualifies for Shingles Vaccine? Yes   Zostavax completed No   Shingrix Completed?: No.    Education has been provided regarding the importance of this vaccine. Patient has been advised to call insurance company to determine out of pocket expense if they have not yet received this vaccine. Advised may also receive  vaccine at local pharmacy or Health Dept. Verbalized acceptance and understanding.  Screening Tests Health Maintenance  Topic Date Due   COVID-19 Vaccine (4 - Booster for Pfizer series) 05/13/2020   Zoster Vaccines- Shingrix (1 of 2) 07/28/2021 (Originally 10/25/1978)  TETANUS/TDAP  11/11/2026   Pneumonia Vaccine 53+ Years old  Completed   INFLUENZA VACCINE  Completed   DEXA SCAN  Completed   HPV VACCINES  Aged Out    Health Maintenance  Health Maintenance Due  Topic Date Due   COVID-19 Vaccine (4 - Booster for Pfizer series) 05/13/2020    Colorectal cancer screening: No longer required.   Mammogram status: No longer required due to age.  Bone Density status: Completed 2017. Results reflect: Bone density results: NORMAL. Repeat every 0 years.  Lung Cancer Screening: (Low Dose CT Chest recommended if Age 64-80 years, 30 pack-year currently smoking OR have quit w/in 15years.) does not qualify.  Lung Cancer Screening Referral:   Additional Screening:  Hepatitis C Screening: does not qualify;   Vision Screening: Recommended annual ophthalmology exams for early detection of glaucoma and other disorders of the eye. Is the patient up to date with their annual eye exam?  Yes  Who is the provider or what is the name of the office in which the patient attends annual eye exams? [facility] If pt is not established with a provider, would they like to be referred to a provider to establish care? No .   Dental Screening: Recommended annual dental exams for proper oral hygiene  Community Resource Referral / Chronic Care Management: CRR required this visit?  No   CCM required this visit?  No      Plan:     I have personally reviewed and noted the following in the patients chart:   Medical and social history Use of alcohol, tobacco or illicit drugs  Current medications and supplements including opioid prescriptions.  Functional ability and status Nutritional status Physical  activity Advanced directives List of other physicians Hospitalizations, surgeries, and ER visits in previous 12 months Vitals Screenings to include cognitive, depression, and falls Referrals and appointments  In addition, I have reviewed and discussed with patient certain preventive protocols, quality metrics, and best practice recommendations. A written personalized care plan for preventive services as well as general preventive health recommendations were provided to patient.     Leroy Kennedy, LPN   06/30/9673   Nurse Notes:

## 2021-05-01 ENCOUNTER — Ambulatory Visit: Payer: PPO

## 2021-05-04 NOTE — Progress Notes (Signed)
I, as supervising physician, have reviewed the nurse health advisor's Medicare Wellness Visit note for this patient and concur with the findings and recommendations listed above.   

## 2021-05-20 DIAGNOSIS — B353 Tinea pedis: Secondary | ICD-10-CM | POA: Diagnosis not present

## 2021-05-20 DIAGNOSIS — Z85828 Personal history of other malignant neoplasm of skin: Secondary | ICD-10-CM | POA: Diagnosis not present

## 2021-05-20 DIAGNOSIS — L538 Other specified erythematous conditions: Secondary | ICD-10-CM | POA: Diagnosis not present

## 2021-05-20 DIAGNOSIS — Z08 Encounter for follow-up examination after completed treatment for malignant neoplasm: Secondary | ICD-10-CM | POA: Diagnosis not present

## 2021-05-20 DIAGNOSIS — L820 Inflamed seborrheic keratosis: Secondary | ICD-10-CM | POA: Diagnosis not present

## 2021-05-20 DIAGNOSIS — R208 Other disturbances of skin sensation: Secondary | ICD-10-CM | POA: Diagnosis not present

## 2021-05-20 DIAGNOSIS — B078 Other viral warts: Secondary | ICD-10-CM | POA: Diagnosis not present

## 2021-05-20 DIAGNOSIS — D692 Other nonthrombocytopenic purpura: Secondary | ICD-10-CM | POA: Diagnosis not present

## 2021-05-20 DIAGNOSIS — L309 Dermatitis, unspecified: Secondary | ICD-10-CM | POA: Diagnosis not present

## 2021-05-25 DIAGNOSIS — H401131 Primary open-angle glaucoma, bilateral, mild stage: Secondary | ICD-10-CM | POA: Diagnosis not present

## 2021-05-25 DIAGNOSIS — H353131 Nonexudative age-related macular degeneration, bilateral, early dry stage: Secondary | ICD-10-CM | POA: Diagnosis not present

## 2021-05-25 DIAGNOSIS — H26493 Other secondary cataract, bilateral: Secondary | ICD-10-CM | POA: Diagnosis not present

## 2021-05-26 ENCOUNTER — Telehealth: Payer: PPO

## 2021-05-26 ENCOUNTER — Ambulatory Visit (INDEPENDENT_AMBULATORY_CARE_PROVIDER_SITE_OTHER): Payer: PPO

## 2021-05-26 DIAGNOSIS — I10 Essential (primary) hypertension: Secondary | ICD-10-CM | POA: Diagnosis not present

## 2021-05-26 DIAGNOSIS — K5900 Constipation, unspecified: Secondary | ICD-10-CM

## 2021-05-26 DIAGNOSIS — E039 Hypothyroidism, unspecified: Secondary | ICD-10-CM | POA: Diagnosis not present

## 2021-05-26 DIAGNOSIS — E785 Hyperlipidemia, unspecified: Secondary | ICD-10-CM

## 2021-05-26 NOTE — Patient Instructions (Signed)
Visit Information  Thank you for taking time to visit with me today. Please don't hesitate to contact me if I can be of assistance to you before our next scheduled telephone appointment.  Following are the goals we discussed today:  RNCM Clinical Goal(s):  Patient will verbalize understanding of plan for management of HTN, HLD, Hypothyroidism, and Constipation   verbalize basic understanding of HTN, HLD, Hypothyroidism, and Constipation  disease process and self health management plan  take all medications exactly as prescribed and will call provider for medication related questions demonstrate understanding of rationale for each prescribed medication  attend all scheduled medical appointments: 06-04-2021 at 0820 am demonstrate improved and ongoing adherence to prescribed treatment plan for HTN, HLD, Hypothyroidism, and Constipation as evidenced by adherence to prescribed medication regimen contacting provider for new or worsened symptoms or questions  demonstrate a decrease in HTN, HLD, Hypothyroidism, and constipation  exacerbations  demonstrate ongoing self health care management ability to effectively manage chronic conditions  through collaboration with RN Care manager, provider, and care team.    Interventions: 1:1 collaboration with primary care provider regarding development and update of comprehensive plan of care as evidenced by provider attestation and co-signature Inter-disciplinary care team collaboration (see longitudinal plan of care) Evaluation of current treatment plan related to  self management and patient's adherence to plan as established by provider     SDOH Barriers (Status: Goal on track: YES.) Long Term Goal (>30 days) Patient interviewed and SDOH assessment performed        SDOH Interventions     Flowsheet Row Most Recent Value  SDOH Interventions    Food Insecurity Interventions Intervention Not Indicated  Financial Strain Interventions Intervention Not Indicated   Housing Interventions Intervention Not Indicated  Intimate Partner Violence Interventions Intervention Not Indicated  Physical Activity Interventions Intervention Not Indicated, Other (Comments)  [active in her yard]  Stress Interventions Intervention Not Indicated  Social Connections Interventions Intervention Not Indicated  Transportation Interventions Intervention Not Indicated       Patient interviewed and appropriate assessments performed Provided patient with information about changes in SDOH, and resources to help with any needs. Care guides being available to assist with changes  Discussed plans with patient for ongoing care management follow up and provided patient with direct contact information for care management team Advised patient to call the office for changes in SDOH, new concerns or questions.  Saw dermatologist recently and will go back for follow up on some areas of concern the patient has. Will continue to monitor for changes.        Hypothyroidism  (Status: Goal on track: YES.) Evaluation of current treatment plan related to  Hypothyroidism,  self-management and patient's adherence to plan as established by provider. 01-23-2021: The patient states that she is feeling more tired than usual. She is a little concerned that her thyroid levels are on the low end of normal and said the the specialist always told her that it should be on the higher end of normal. She would like to know from Dr. Wynetta Emery if her levothyroxine dose should be changed. Will collaborate with Dr. Wynetta Emery and send an inbasket message to Dr. Wynetta Emery for recommendations. Reviewed the results with the patient and confirmed that she is taking levothyroxine 112 mcg daily as prescribed. 03-17-2021: The patient states she is doing well. Her thyroid level was normal at recent appointment. The patient states she feels great and denies any new concerns with Thyroid health and well being.  05-26-2021:  The patient denies  any new issues with her thyroid. Is compliant with the plan of care.  Discussed plans with patient for ongoing care management follow up and provided patient with direct contact information for care management team Advised patient to call the office for changes in condition, questions, or concerns; Provided education to patient re: thyroid level being in normal range.  The patient is concerned it is on the low end of normal and was told before it needed to be on the higher range. ; Reviewed medications with patient and discussed compliance. The patient is taking levothyroxine 112 mcg as prescribed. 05-26-2021: The patient is compliant with medications; Provided patient with tsh educational materials related to hypothyroidism ; Reviewed scheduled/upcoming provider appointments including 06-04-2021 at [place] am, the patient is going in the office today to get her flu vaccine; Discussed plans with patient for ongoing care management follow up and provided patient with direct contact information for care management team; Screening for signs and symptoms of depression related to chronic disease state;  Assessed social determinant of health barriers;    Hyperlipidemia:  (Status: Goal on track: YES.)      Lab Results  Component Value Date    CHOL 171 03/06/2021    HDL 77 03/06/2021    LDLCALC 82 03/06/2021    TRIG 60 03/06/2021    CHOLHDL 3.3 03/11/2020      Medication review performed; medication list updated in electronic medical record. 05-26-2021: Lipitor 20 mg daily and is compliant.  Provider established cholesterol goals reviewed. 05-26-2021: The patient is doing well and denies any acute findings with HLD and heart health. Will continue to monitor for changes in conditions. ; Counseled on importance of regular laboratory monitoring as prescribed; Provided HLD educational materials; Reviewed role and benefits of statin for ASCVD risk reduction; Discussed strategies to manage statin-induced  myalgias; Reviewed importance of limiting foods high in cholesterol. 05-26-2021: the patient is eating well.  Her children visited recently and she states her daughter in law prepared freezer meals for her to have on hand and cook when she wanted them. She denies any issues with compliance with dietary restrictions; Last eye exam on 05-25-2021. Will see her eye provider again in 6 months.   Hypertension: (Status: Goal on track: YES.) Last practice recorded BP readings:     BP Readings from Last 3 Encounters:  03/06/21 121/75  11/28/20 132/80  05/28/20 (!) 145/77  Most recent eGFR/CrCl:       Lab Results  Component Value Date    EGFR 58 (L) 03/06/2021    No components found for: CRCL   Evaluation of current treatment plan related to hypertension self management and patient's adherence to plan as established by provider. 03-17-2021: Blood pressures more stable. The patient saw pcp recently and receive a good report. Denies any acute findings and states she is eating well and feeling good.   05-26-2021: Blood pressures are stable. Denies any acute findings. Is compliant with plan of care.  Provided education to patient re: stroke prevention, s/s of heart attack and stroke; Reviewed prescribed diet Heart Healthy. 05-26-2021: The patient is compliant with a heart healthy diet Reviewed medications with patient and discussed importance of compliance. 05-26-2021: The patient is compliant with a heart healthy diet;  Discussed plans with patient for ongoing care management follow up and provided patient with direct contact information for care management team; Advised patient, providing education and rationale, to monitor blood pressure daily and record, calling PCP for findings  outside established parameters;  Reviewed scheduled/upcoming provider appointments including: 06-04-2021 at 0820 am Provided education on prescribed diet Heart healthy;  Discussed complications of poorly controlled blood pressure  such as heart disease, stroke, circulatory complications, vision complications, kidney impairment, sexual dysfunction;      Bowel concerns/constipation  (Status: Goal on Track (progressing): YES.) Long Term Goal  Evaluation of current treatment plan related to  bowel concerns/constipation ,  self-management and patient's adherence to plan as established by provider. 05-26-2021: The patient states that she heard from the referral made by Dr. Wynetta Emery and the first available appointment for her to go and learn about exercises for bowel health is 06-10-2021. She states she thought they had forgotten her but she has heard from them and she will see what they say. Her bowel regimen is working well at this time and stable. She is taking Miralax 5 days a week. She denies any acute distress. Follow up planned with Dr. Wynetta Emery on 06-04-2021 Discussed plans with patient for ongoing care management follow up and provided patient with direct contact information for care management team Advised patient to call after the holidays the office if she has not heard from the referral for the PT to work with her for pelvic exercises. 05-26-2021: The patient states heard from the referral and her appointment is 06-10-2021 for evaluation and recommendations.; Provided education to patient re: monitoring for changes in bowel habits, taking medications as directed, calling the office for questions or concerns related to bowel health and well being; Reviewed medications with patient and discussed compliance ; Reviewed scheduled/upcoming provider appointments including 06-04-2021 at 0820 am; Discussed plans with patient for ongoing care management follow up and provided patient with direct contact information for care management team; Advised patient to discuss bowel changes and concerns with provider; Screening for signs and symptoms of depression related to chronic disease state;  Assessed social determinant of health barriers;      Patient Goals/Self-Care Activities: Patient will self administer medications as prescribed as evidenced by self report/primary caregiver report  Patient will attend all scheduled provider appointments as evidenced by clinician review of documented attendance to scheduled appointments and patient/caregiver report Patient will call pharmacy for medication refills as evidenced by patient report and review of pharmacy fill history as appropriate Patient will attend church or other social activities as evidenced by patient report Patient will continue to perform ADL's independently as evidenced by patient/caregiver report Patient will continue to perform IADL's independently as evidenced by patient/caregiver report Patient will call provider office for new concerns or questions as evidenced by review of documented incoming telephone call notes and patient report Patient will work with BSW to address care coordination needs and will continue to work with the clinical team to address health care and disease management related needs as evidenced by documented adherence to scheduled care management/care coordination appointments - check blood pressure 3 times per week - choose a place to take my blood pressure (home, clinic or office, retail store) - write blood pressure results in a log or diary - learn about high blood pressure - keep a blood pressure log - take blood pressure log to all doctor appointments - call doctor for signs and symptoms of high blood pressure - develop an action plan for high blood pressure - keep all doctor appointments - take medications for blood pressure exactly as prescribed - report new symptoms to your doctor - eat more whole grains, fruits and vegetables, lean meats and healthy fats -  call for medicine refill 2 or 3 days before it runs out - take all medications exactly as prescribed - call doctor with any symptoms you believe are related to your medicine - call  doctor when you experience any new symptoms - go to all doctor appointments as scheduled - adhere to prescribed diet: heart Healthy       Our next appointment is by telephone on 07-28-2021 at 0945 am  Please call the care guide team at [phone] if you need to cancel or reschedule your appointment.   If you are experiencing a Mental Health or [place] or need someone to talk to, please call the Suicide and Crisis Lifeline: 988 call the [place] National Suicide Prevention Lifeline: 513-678-1245 or TTY: 7054678344 TTY (941)607-4432) to talk to a trained counselor call 1-800-273-TALK (toll free, 24 hour hotline)   Patient verbalizes understanding of instructions and care plan provided today and agrees to view in Micro. Active MyChart status confirmed with patient.    Noreene Larsson RN, MSN, [facility] Mobile: [phone]

## 2021-05-26 NOTE — Chronic Care Management (AMB) (Signed)
Chronic Care Management   CCM RN Visit Note  05/26/2021 Name: Cassie Brewer MRN: [id number] DOB: 12-08-1928  Subjective: Cassie Brewer is a 88 y.o. year old female who is a primary care patient of Valerie Roys, DO. The care management team was consulted for assistance with disease management and care coordination needs.    Engaged with patient by telephone for follow up visit in response to provider referral for case management and/or care coordination services.   Consent to Services:  The patient was given information about Chronic Care Management services, agreed to services, and gave verbal consent prior to initiation of services.  Please see initial visit note for detailed documentation.   Patient agreed to services and verbal consent obtained.   Assessment: Review of patient past medical history, allergies, medications, health status, including review of consultants reports, laboratory and other test data, was performed as part of comprehensive evaluation and provision of chronic care management services.   SDOH (Social Determinants of Health) assessments and interventions performed:    CCM Care Plan  Allergies  Allergen Reactions   Amoxicillin     Other reaction(s): Other (See Comments) "blood pressure very high - almost passed out"   Eryc [Erythromycin] Other (See Comments)   Sulfa Antibiotics Nausea And Vomiting    Nausea with bactrim in 2017  Nausea with bactrim in 2017     Outpatient Encounter Medications as of 05/26/2021  Medication Sig   aspirin EC 81 MG EC tablet Take 1 tablet (81 mg total) by mouth daily. Swallow whole.   atorvastatin (LIPITOR) 20 MG tablet Take 1 tablet (20 mg total) by mouth daily.   benazepril (LOTENSIN) 40 MG tablet Take 1 tablet (40 mg total) by mouth daily.   Calcium Carbonate-Vit D-Min (CALCIUM 600+D PLUS MINERALS) 600-400 MG-UNIT TABS Take 1 tablet by mouth as directed.   clotrimazole-betamethasone (LOTRISONE) cream Apply 1  application topically 2 (two) times daily.   fluticasone (FLONASE) 50 MCG/ACT nasal spray Place 2 sprays into both nostrils daily as needed for allergies.   hydrochlorothiazide (HYDRODIURIL) 25 MG tablet Take 1 tablet (25 mg total) by mouth daily.   hydrocortisone (ANUSOL-HC) 2.5 % rectal cream Place 1 application rectally 2 (two) times daily.   latanoprost (XALATAN) 0.005 % ophthalmic solution Place 1 drop into both eyes at bedtime.   levothyroxine (SYNTHROID) 112 MCG tablet Take 1 tablet (112 mcg total) by mouth daily before breakfast.   Multiple Vitamins-Minerals (CENTRUM SILVER ULTRA WOMENS PO) Take 1 tablet by mouth daily.   Omega-3 Fatty Acids (FISH OIL) 1000 MG CAPS Take 1,000 mg by mouth daily.   polyethylene glycol powder (GLYCOLAX/MIRALAX) 17 GM/SCOOP powder Take 17 g by mouth 2 (two) times daily as needed.   timolol (TIMOPTIC) 0.5 % ophthalmic solution Place 1 drop into the left eye daily.   vitamin C (ASCORBIC ACID) 500 MG tablet Take 500 mg by mouth daily.   No facility-administered encounter medications on file as of 05/26/2021.    Patient Active Problem List   Diagnosis Date Noted   Constipation 03/06/2021   Aphasia 03/21/2020   Aphasia S/P CVA 03/11/2020   TIA (transient ischemic attack) 03/10/2020   Arthritis of foot 04/24/2015   Arthropathy 04/24/2015   Benign hypertensive renal disease 10/02/2014   Hypothyroidism 10/02/2014   Hyperlipidemia 10/02/2014   CKD (chronic kidney disease), stage III (Hand) 10/02/2014    Conditions to be addressed/monitored:HTN, HLD, Hypothyroidism, and Constipation   Care Plan : RNCM: General Plan of Care (Adult)  for Chronic Disease Manage and Care Coordination Needs  Updates made by Vanita Ingles, RN since 05/26/2021 12:00 AM     Problem: RNCM: Development of Plan of Care for Chronic Disease Management (HTN, HLD, Constipation, Hypothyroidism)   Priority: High     Long-Range Goal: RNCM: Effective Management of Plan of Care for Chronic  Disease Management (HTN, HLD, Constipation, Hypothyroidism)   Start Date: 01/23/2021  Expected End Date: 01/23/2022  Priority: High  Note:   Current Barriers:  Knowledge Deficits related to plan of care for management of HTN, HLD, and Hypothyroidism and Constipation  Chronic Disease Management support and education needs related to HTN, HLD, and Hypothyroidism and Constipation  RNCM Clinical Goal(s):  Patient will verbalize understanding of plan for management of HTN, HLD, Hypothyroidism, and Constipation   verbalize basic understanding of HTN, HLD, Hypothyroidism, and Constipation  disease process and self health management plan  take all medications exactly as prescribed and will call provider for medication related questions demonstrate understanding of rationale for each prescribed medication  attend all scheduled medical appointments: 06-04-2021 at 0820 am demonstrate improved and ongoing adherence to prescribed treatment plan for HTN, HLD, Hypothyroidism, and Constipation as evidenced by adherence to prescribed medication regimen contacting provider for new or worsened symptoms or questions  demonstrate a decrease in HTN, HLD, Hypothyroidism, and constipation  exacerbations  demonstrate ongoing self health care management ability to effectively manage chronic conditions  through collaboration with RN Care manager, provider, and care team.   Interventions: 1:1 collaboration with primary care provider regarding development and update of comprehensive plan of care as evidenced by provider attestation and co-signature Inter-disciplinary care team collaboration (see longitudinal plan of care) Evaluation of current treatment plan related to  self management and patient's adherence to plan as established by provider   SDOH Barriers (Status: Goal on track: YES.) Long Term Goal (>30 days) Patient interviewed and SDOH assessment performed        SDOH Interventions    Flowsheet Row Most Recent  Value  SDOH Interventions   Food Insecurity Interventions Intervention Not Indicated  Financial Strain Interventions Intervention Not Indicated  Housing Interventions Intervention Not Indicated  Intimate Partner Violence Interventions Intervention Not Indicated  Physical Activity Interventions Intervention Not Indicated, Other (Comments)  [active in her yard]  Stress Interventions Intervention Not Indicated  Social Connections Interventions Intervention Not Indicated  Transportation Interventions Intervention Not Indicated     Patient interviewed and appropriate assessments performed Provided patient with information about changes in SDOH, and resources to help with any needs. Care guides being available to assist with changes  Discussed plans with patient for ongoing care management follow up and provided patient with direct contact information for care management team Advised patient to call the office for changes in SDOH, new concerns or questions.  Saw dermatologist recently and will go back for follow up on some areas of concern the patient has. Will continue to monitor for changes.     Hypothyroidism  (Status: Goal on track: YES.) Evaluation of current treatment plan related to  Hypothyroidism,  self-management and patient's adherence to plan as established by provider. 01-23-2021: The patient states that she is feeling more tired than usual. She is a little concerned that her thyroid levels are on the low end of normal and said the the specialist always told her that it should be on the higher end of normal. She would like to know from Dr. Wynetta Emery if her levothyroxine dose should be changed. Will collaborate  with Dr. Wynetta Emery and send an inbasket message to Dr. Wynetta Emery for recommendations. Reviewed the results with the patient and confirmed that she is taking levothyroxine 112 mcg daily as prescribed. 03-17-2021: The patient states she is doing well. Her thyroid level was normal at recent  appointment. The patient states she feels great and denies any new concerns with Thyroid health and well being.  05-26-2021: The patient denies any new issues with her thyroid. Is compliant with the plan of care.  Discussed plans with patient for ongoing care management follow up and provided patient with direct contact information for care management team Advised patient to call the office for changes in condition, questions, or concerns; Provided education to patient re: thyroid level being in normal range.  The patient is concerned it is on the low end of normal and was told before it needed to be on the higher range. ; Reviewed medications with patient and discussed compliance. The patient is taking levothyroxine 112 mcg as prescribed. 05-26-2021: The patient is compliant with medications; Provided patient with tsh educational materials related to hypothyroidism ; Reviewed scheduled/upcoming provider appointments including 06-04-2021 at [place] [facility] am, the patient is going in the office today to get her flu vaccine; Discussed plans with patient for ongoing care management follow up and provided patient with direct contact information for care management team; Screening for signs and symptoms of depression related to chronic disease state;  Assessed social determinant of health barriers;   Hyperlipidemia:  (Status: Goal on track: YES.) Lab Results  Component Value Date   CHOL 171 03/06/2021   HDL 77 03/06/2021   LDLCALC 82 03/06/2021   TRIG 60 03/06/2021   CHOLHDL 3.3 03/11/2020     Medication review performed; medication list updated in electronic medical record. 05-26-2021: Lipitor 20 mg daily and is compliant.  Provider established cholesterol goals reviewed. 05-26-2021: The patient is doing well and denies any acute findings with HLD and heart health. Will continue to monitor for changes in conditions. ; Counseled on importance of regular laboratory monitoring as prescribed; Provided HLD  educational materials; Reviewed role and benefits of statin for ASCVD risk reduction; Discussed strategies to manage statin-induced myalgias; Reviewed importance of limiting foods high in cholesterol. 05-26-2021: the patient is eating well.  Her children visited recently and she states her daughter in law prepared freezer meals for her to have on hand and cook when she wanted them. She denies any issues with compliance with dietary restrictions; Last eye exam on 05-25-2021. Will see her eye provider again in 6 months.  Hypertension: (Status: Goal on track: YES.) Last practice recorded BP readings:  BP Readings from Last 3 Encounters:  03/06/21 121/75  11/28/20 132/80  05/28/20 (!) 145/77  Most recent eGFR/CrCl:  Lab Results  Component Value Date   EGFR 58 (L) 03/06/2021    No components found for: CRCL  Evaluation of current treatment plan related to hypertension self management and patient's adherence to plan as established by provider. 03-17-2021: Blood pressures more stable. The patient saw pcp recently and receive a good report. Denies any acute findings and states she is eating well and feeling good.   05-26-2021: Blood pressures are stable. Denies any acute findings. Is compliant with plan of care.  Provided education to patient re: stroke prevention, s/s of heart attack and stroke; Reviewed prescribed diet Heart Healthy. 05-26-2021: The patient is compliant with a heart healthy diet Reviewed medications with patient and discussed importance of compliance. 05-26-2021: The patient is compliant with  a heart healthy diet;  Discussed plans with patient for ongoing care management follow up and provided patient with direct contact information for care management team; Advised patient, providing education and rationale, to monitor blood pressure daily and record, calling PCP for findings outside established parameters;  Reviewed scheduled/upcoming provider appointments including: 06-04-2021 at 0820  am Provided education on prescribed diet Heart healthy;  Discussed complications of poorly controlled blood pressure such as heart disease, stroke, circulatory complications, vision complications, kidney impairment, sexual dysfunction;    Bowel concerns/constipation  (Status: Goal on Track (progressing): YES.) Long Term Goal  Evaluation of current treatment plan related to  bowel concerns/constipation ,  self-management and patient's adherence to plan as established by provider. 05-26-2021: The patient states that she heard from the referral made by Dr. Wynetta Emery and the first available appointment for her to go and learn about exercises for bowel health is 06-10-2021. She states she thought they had forgotten her but she has heard from them and she will see what they say. Her bowel regimen is working well at this time and stable. She is taking Miralax 5 days a week. She denies any acute distress. Follow up planned with Dr. Wynetta Emery on 06-04-2021 Discussed plans with patient for ongoing care management follow up and provided patient with direct contact information for care management team Advised patient to call after the holidays the office if she has not heard from the referral for the PT to work with her for pelvic exercises. 05-26-2021: The patient states heard from the referral and her appointment is 06-10-2021 for evaluation and recommendations.; Provided education to patient re: monitoring for changes in bowel habits, taking medications as directed, calling the office for questions or concerns related to bowel health and well being; Reviewed medications with patient and discussed compliance ; Reviewed scheduled/upcoming provider appointments including 06-04-2021 at 0820 am; Discussed plans with patient for ongoing care management follow up and provided patient with direct contact information for care management team; Advised patient to discuss bowel changes and concerns with provider; Screening for signs  and symptoms of depression related to chronic disease state;  Assessed social determinant of health barriers;    Patient Goals/Self-Care Activities: Patient will self administer medications as prescribed as evidenced by self report/primary caregiver report  Patient will attend all scheduled provider appointments as evidenced by clinician review of documented attendance to scheduled appointments and patient/caregiver report Patient will call pharmacy for medication refills as evidenced by patient report and review of pharmacy fill history as appropriate Patient will attend church or other social activities as evidenced by patient report Patient will continue to perform ADL's independently as evidenced by patient/caregiver report Patient will continue to perform IADL's independently as evidenced by patient/caregiver report Patient will call provider office for new concerns or questions as evidenced by review of documented incoming telephone call notes and patient report Patient will work with BSW to address care coordination needs and will continue to work with the clinical team to address health care and disease management related needs as evidenced by documented adherence to scheduled care management/care coordination appointments - check blood pressure 3 times per week - choose a place to take my blood pressure (home, clinic or office, retail store) - write blood pressure results in a log or diary - learn about high blood pressure - keep a blood pressure log - take blood pressure log to all doctor appointments - call doctor for signs and symptoms of high blood pressure - develop an action  plan for high blood pressure - keep all doctor appointments - take medications for blood pressure exactly as prescribed - report new symptoms to your doctor - eat more whole grains, fruits and vegetables, lean meats and healthy fats - call for medicine refill 2 or 3 days before it runs out - take all  medications exactly as prescribed - call doctor with any symptoms you believe are related to your medicine - call doctor when you experience any new symptoms - go to all doctor appointments as scheduled - adhere to prescribed diet: heart Healthy        Plan:Telephone follow up appointment with care management team member scheduled for:  07-28-2021 at [place] am  [place], MSN, [facility] Mobile: [phone]

## 2021-06-04 ENCOUNTER — Ambulatory Visit (INDEPENDENT_AMBULATORY_CARE_PROVIDER_SITE_OTHER): Payer: PPO | Admitting: Family Medicine

## 2021-06-04 ENCOUNTER — Other Ambulatory Visit: Payer: Self-pay

## 2021-06-04 ENCOUNTER — Encounter: Payer: Self-pay | Admitting: Family Medicine

## 2021-06-04 DIAGNOSIS — I10 Essential (primary) hypertension: Secondary | ICD-10-CM | POA: Diagnosis not present

## 2021-06-04 DIAGNOSIS — I129 Hypertensive chronic kidney disease with stage 1 through stage 4 chronic kidney disease, or unspecified chronic kidney disease: Secondary | ICD-10-CM

## 2021-06-04 DIAGNOSIS — E785 Hyperlipidemia, unspecified: Secondary | ICD-10-CM | POA: Diagnosis not present

## 2021-06-04 DIAGNOSIS — E039 Hypothyroidism, unspecified: Secondary | ICD-10-CM

## 2021-06-04 MED ORDER — HYDROCHLOROTHIAZIDE 25 MG PO TABS: 25.0000 mg | ORAL_TABLET | Freq: Every day | ORAL | 1 refills | Status: DC

## 2021-06-04 MED ORDER — BENAZEPRIL HCL 40 MG PO TABS: 40.0000 mg | ORAL_TABLET | Freq: Every day | ORAL | 1 refills | Status: DC

## 2021-06-04 MED ORDER — ATORVASTATIN CALCIUM 20 MG PO TABS
20.0000 mg | ORAL_TABLET | Freq: Every day | ORAL | 1 refills | Status: DC
Start: 2021-06-04 — End: 2021-09-07

## 2021-06-04 MED ORDER — ASPIRIN 81 MG PO TBEC: 81.0000 mg | DELAYED_RELEASE_TABLET | Freq: Every day | ORAL | 12 refills | Status: DC

## 2021-06-04 NOTE — Assessment & Plan Note (Signed)
Under good control on current regimen. Continue current regimen. Continue to monitor. Call with any concerns. Refills up to date. Will do tier exception. Recheck labs next visit.  ? ?

## 2021-06-04 NOTE — Assessment & Plan Note (Signed)
Under good control on current regimen. Continue current regimen. Continue to monitor. Call with any concerns. Refills given. Labs normal last visit. Recheck next visit.  ? ?

## 2021-06-04 NOTE — Progress Notes (Signed)
? ?BP 134/75   Pulse 66   Temp 98 ?F (36.7 ?C)   Wt 147 lb 9.6 oz (67 kg)   LMP 05/13/1980   SpO2 97%   BMI 25.32 kg/m?   ? ?Subjective:  ? ? Patient ID: Cassie Brewer, female    DOB: Jul 28, 1928, 88 y.o.   MRN: [id number] ? ?HPI: ?Cassie Brewer is a 88 y.o. female ? ?Chief Complaint  ?Patient presents with  ? Hypothyroidism  ? Hypertension  ? Chronic Kidney Disease  ? ?HYPOTHYROIDISM ?Thyroid control status:controlled ?Satisfied with current treatment? yes ?Medication side effects: no ?Medication compliance: excellent compliance ?Recent dose adjustment:no ?Fatigue: no ?Cold intolerance: no ?Heat intolerance: no ?Weight gain: no ?Weight loss: no ?Constipation: no ?Diarrhea/loose stools: no ?Palpitations: no ?Lower extremity edema: no ?Anxiety/depressed mood: no ? ?HYPERTENSION / HYPERLIPIDEMIA ?Satisfied with current treatment? yes ?Duration of hypertension: chronic ?BP monitoring frequency: not checking ?BP medication side effects: no ?Past BP meds: benazepril ?Duration of hyperlipidemia: chronic ?Cholesterol medication side effects: no ?Cholesterol supplements: fish oil ?Past cholesterol medications: atorvastatin ?Medication compliance: excellent compliance ?Aspirin: yes ?Recent stressors: no ?Recurrent headaches: no ?Visual changes: no ?Palpitations: no ?Dyspnea: no ?Chest pain: no ?Lower extremity edema: no ?Dizzy/lightheaded: no ? ?Relevant past medical, surgical, family and social history reviewed and updated as indicated. Interim medical history since our last visit reviewed. ?Allergies and medications reviewed and updated. ? ?Review of Systems  ?Constitutional: Negative.   ?Respiratory: Negative.    ?Cardiovascular: Negative.   ?Gastrointestinal: Negative.   ?Musculoskeletal: Negative.   ?Psychiatric/Behavioral: Negative.    ? ?Per HPI unless specifically indicated above ? ?   ?Objective:  ?  ?BP 134/75   Pulse 66   Temp 98 ?F (36.7 ?C)   Wt 147 lb 9.6 oz (67 kg)   LMP 05/13/1980   SpO2 97%   BMI  25.32 kg/m?   ?Wt Readings from Last 3 Encounters:  ?06/04/21 147 lb 9.6 oz (67 kg)  ?03/06/21 144 lb 12.8 oz (65.7 kg)  ?11/28/20 143 lb (64.9 kg)  ?  ?Physical Exam ?Vitals and nursing note reviewed.  ?Constitutional:   ?   General: She is not in acute distress. ?   Appearance: Normal appearance. She is not ill-appearing, toxic-appearing or diaphoretic.  ?HENT:  ?   Head: Normocephalic and atraumatic.  ?   Right Ear: External ear normal.  ?   Left Ear: External ear normal.  ?   Nose: Nose normal.  ?   Mouth/Throat:  ?   Mouth: Mucous membranes are moist.  ?   Pharynx: Oropharynx is clear.  ?Eyes:  ?   General: No scleral icterus.    ?   Right eye: No discharge.     ?   Left eye: No discharge.  ?   Extraocular Movements: Extraocular movements intact.  ?   Conjunctiva/sclera: Conjunctivae normal.  ?   Pupils: Pupils are equal, round, and reactive to light.  ?Cardiovascular:  ?   Rate and Rhythm: Normal rate and regular rhythm.  ?   Pulses: Normal pulses.  ?   Heart sounds: Normal heart sounds. No murmur heard. ?  No friction rub. No gallop.  ?Pulmonary:  ?   Effort: Pulmonary effort is normal. No respiratory distress.  ?   Breath sounds: Normal breath sounds. No stridor. No wheezing, rhonchi or rales.  ?Chest:  ?   Chest wall: No tenderness.  ?Musculoskeletal:     ?   General: Normal range of motion.  ?  Cervical back: Normal range of motion and neck supple.  ?Skin: ?   General: Skin is warm and dry.  ?   Capillary Refill: Capillary refill takes less than 2 seconds.  ?   Coloration: Skin is not jaundiced or pale.  ?   Findings: No bruising, erythema, lesion or rash.  ?Neurological:  ?   General: No focal deficit present.  ?   Mental Status: She is alert and oriented to person, place, and time. Mental status is at baseline.  ?Psychiatric:     ?   Mood and Affect: Mood normal.     ?   Behavior: Behavior normal.     ?   Thought Content: Thought content normal.     ?   Judgment: Judgment normal.  ? ? ?Results for  orders placed or performed in visit on 03/06/21  ?Microscopic Examination  ? Urine  ?Result Value Ref Range  ? WBC, UA 0-5 0 - 5 /hpf  ? RBC 0-2 0 - 2 /hpf  ? Epithelial Cells (non renal) None seen 0 - 10 /hpf  ? Bacteria, UA Few (A) None seen/Few  ?Comprehensive metabolic panel  ?Result Value Ref Range  ? Glucose 103 (H) 70 - 99 mg/dL  ? BUN 23 10 - 36 mg/dL  ? Creatinine, Ser 0.93 0.57 - 1.00 mg/dL  ? eGFR 58 (L) >59 mL/min/1.73  ? BUN/Creatinine Ratio 25 12 - 28  ? Sodium 140 134 - 144 mmol/L  ? Potassium 4.0 3.5 - 5.2 mmol/L  ? Chloride 101 96 - 106 mmol/L  ? CO2 25 20 - 29 mmol/L  ? Calcium 9.6 8.7 - 10.3 mg/dL  ? Total Protein 6.9 6.0 - 8.5 g/dL  ? Albumin 4.5 3.5 - 4.6 g/dL  ? Globulin, Total 2.4 1.5 - 4.5 g/dL  ? Albumin/Globulin Ratio 1.9 1.2 - 2.2  ? Bilirubin Total 0.5 0.0 - 1.2 mg/dL  ? Alkaline Phosphatase 63 44 - 121 IU/L  ? AST 18 0 - 40 IU/L  ? ALT 14 0 - 32 IU/L  ?CBC with Differential/Platelet  ?Result Value Ref Range  ? WBC 5.2 3.4 - 10.8 x10E3/uL  ? RBC 4.00 3.77 - 5.28 x10E6/uL  ? Hemoglobin 11.8 11.1 - 15.9 g/dL  ? Hematocrit 34.8 34.0 - 46.6 %  ? MCV 87 79 - 97 fL  ? MCH 29.5 26.6 - 33.0 pg  ? MCHC 33.9 31.5 - 35.7 g/dL  ? RDW 12.8 11.7 - 15.4 %  ? Platelets 320 150 - 450 x10E3/uL  ? Neutrophils 62 Not Estab. %  ? Lymphs 23 Not Estab. %  ? Monocytes 10 Not Estab. %  ? Eos 4 Not Estab. %  ? Basos 1 Not Estab. %  ? Neutrophils Absolute 3.3 1.4 - 7.0 x10E3/uL  ? Lymphocytes Absolute 1.2 0.7 - 3.1 x10E3/uL  ? Monocytes Absolute 0.5 0.1 - 0.9 x10E3/uL  ? EOS (ABSOLUTE) 0.2 0.0 - 0.4 x10E3/uL  ? Basophils Absolute 0.1 0.0 - 0.2 x10E3/uL  ? Immature Granulocytes 0 Not Estab. %  ? Immature Grans (Abs) 0.0 0.0 - 0.1 x10E3/uL  ?Lipid Panel w/o Chol/HDL Ratio  ?Result Value Ref Range  ? Cholesterol, Total 171 100 - 199 mg/dL  ? Triglycerides 60 0 - 149 mg/dL  ? HDL 77 >39 mg/dL  ? VLDL Cholesterol Cal 12 5 - 40 mg/dL  ? LDL Chol Calc (NIH) 82 0 - 99 mg/dL  ?Microalbumin, Urine Waived  ?Result Value Ref  Range  ? Microalb,  Ur Waived 10 0 - 19 mg/L  ? Creatinine, Urine Waived 50 10 - 300 mg/dL  ? Microalb/Creat Ratio <30 <30 mg/g  ?TSH  ?Result Value Ref Range  ? TSH 0.909 0.450 - 4.500 uIU/mL  ?Urinalysis, Routine w reflex microscopic  ?Result Value Ref Range  ? Specific Gravity, UA 1.015 1.005 - 1.030  ? pH, UA 7.0 5.0 - 7.5  ? Color, UA Yellow Yellow  ? Appearance Ur Clear Clear  ? Leukocytes,UA 2+ (A) Negative  ? Protein,UA Negative Negative/Trace  ? Glucose, UA Negative Negative  ? Ketones, UA Negative Negative  ? RBC, UA Negative Negative  ? Bilirubin, UA Negative Negative  ? Urobilinogen, Ur 0.2 0.2 - 1.0 mg/dL  ? Nitrite, UA Negative Negative  ? Microscopic Examination See below:   ? ?   ?Assessment & Plan:  ? ?Problem List Items Addressed This Visit   ? ?  ? Endocrine  ? Hypothyroidism  ?  Under good control on current regimen. Continue current regimen. Continue to monitor. Call with any concerns. Refills up to date. Will do tier exception. Recheck labs next visit.  ? ?  ?  ?  ? Genitourinary  ? Benign hypertensive renal disease  ?  Under good control on current regimen. Continue current regimen. Continue to monitor. Call with any concerns. Refills given. Labs normal last visit. Recheck next visit.  ? ?  ?  ? Relevant Medications  ? benazepril (LOTENSIN) 40 MG tablet  ? hydrochlorothiazide (HYDRODIURIL) 25 MG tablet  ?  ? Other  ? Hyperlipidemia  ?  Under good control on current regimen. Continue current regimen. Continue to monitor. Call with any concerns. Refills given. Labs normal last visit. Recheck next visit.  ? ?  ?  ? Relevant Medications  ? benazepril (LOTENSIN) 40 MG tablet  ? atorvastatin (LIPITOR) 20 MG tablet  ? hydrochlorothiazide (HYDRODIURIL) 25 MG tablet  ? aspirin 81 MG EC tablet  ? ?Other Visit Diagnoses   ? ? Essential hypertension      ? Relevant Medications  ? benazepril (LOTENSIN) 40 MG tablet  ? atorvastatin (LIPITOR) 20 MG tablet  ? hydrochlorothiazide (HYDRODIURIL) 25 MG tablet  ?  aspirin 81 MG EC tablet  ? ?  ?  ? ?Follow up plan: ?Return in about 3 months (around 09/04/2021). ? ? ? ? ? ?

## 2021-06-10 ENCOUNTER — Encounter: Payer: Self-pay | Admitting: Physical Therapy

## 2021-06-10 ENCOUNTER — Other Ambulatory Visit: Payer: Self-pay

## 2021-06-10 ENCOUNTER — Ambulatory Visit: Payer: PPO | Attending: Family Medicine | Admitting: Physical Therapy

## 2021-06-10 DIAGNOSIS — K5900 Constipation, unspecified: Secondary | ICD-10-CM | POA: Insufficient documentation

## 2021-06-10 DIAGNOSIS — M6281 Muscle weakness (generalized): Secondary | ICD-10-CM | POA: Insufficient documentation

## 2021-06-10 DIAGNOSIS — R278 Other lack of coordination: Secondary | ICD-10-CM | POA: Insufficient documentation

## 2021-06-10 NOTE — Therapy (Signed)
[place] ?[facility] ?[address] [place], [place], [address] ?Phone: [phone]   Fax:  [phone] ? ?Physical Therapy Evaluation ? ?Patient Details  ?Name: Cassie Brewer ?MRN: [id number] ?Date of Birth: [birth year] ?Referring Provider (PT): Park Liter ? ? ?Encounter Date: 06/10/2021 ? ? PT End of Session - 06/10/21 0951   ? ? Visit Number 1   ? Number of Visits 8   ? Date for PT Re-Evaluation 08/05/21   ? Authorization Type IE 06/10/2021   ? PT Start Time 1000   ? PT Stop Time 6962   ? PT Time Calculation (min) 40 min   ? Activity Tolerance Patient tolerated treatment well   ? Behavior During Therapy [facility] for tasks assessed/performed   ? ?  ?  ? ?  ? ? ?Past Medical History:  ?Diagnosis Date  ? Glaucoma   ? Hyperlipidemia   ? Hypertension   ? Hypothyroidism 10/02/2014  ? Overactive bladder   ? Squamous cell skin cancer, face   ? ? ?Past Surgical History:  ?Procedure Laterality Date  ? BREAST SURGERY    ? THYROIDECTOMY, PARTIAL Right   ? TOTAL ABDOMINAL HYSTERECTOMY    ? ? ?There were no vitals filed for this visit. ? ? ? ? ? ? ? [facility] PT Assessment - 06/10/21 0950   ? ?  ? Assessment  ? Medical Diagnosis Constipation, unspecified constipation type   ? Referring Provider (PT) Park Liter   ? Hand Dominance Right   ? Next MD Visit June 2023   ? Prior Therapy None for this dx   ?  ? Balance Screen  ? Has the patient fallen in the past 6 months No   ? ?  ?  ? ?  ? ?PELVIC HEALTH PHYSICAL THERAPY EVALUATION ? ?SCREENING ?Red Flags: None ?Have you had any night sweats? ?Unexplained weight loss? ?Saddle anesthesia? ?Unexplained changes in bowel or bladder habits? ? ?Precautions: None ? ?SUBJECTIVE ? ?Chief Complaint: Patient states that she is having both bowel and urinary difficulties. She notes bowel problem started in 2021 and continues to manage with miralax about 5x/week. Patient endorses that this prevents her bowels from irregularity but clarifies that she never feels her  bowel movements are complete. Patient was playing badminton regularly prior to [place] and has not yet returned.  ? ?Patient notes urinary incontinence has been longstanding but has gotten worse recently. She notes it is most troubling at night when she soaks through the pad and pajamas. Daytime leakage is more mild. Patient notes that she wakes up around 2A to empty her bladder but will already be wet. Patient notes her bedtime can be anywhere from 930P to 1130P.  ? ?Pertinent History:  ?Falls Negative.  ?Scoliosis Negative. ?Pulmonary disease/dysfunction Negative. ?Surgical history: Positive for see above.  ? ? ?Obstetrical History: ?G2P2 ?Deliveries:  ?Tearing/Episiotomy:  ?Birthing position: ? ?Gynecological History: ?Hysterectomy: Yes Abdominal, 1982 ?Endometriosis: Negative ?Pelvic Organ Prolapse: Negative ? ?Urinary History: ?Incontinence: Positive. Onset: longstanding Triggers: sleeping, nocturnal urge, post-void, arriving home. Amount: Min to Mod.  ?Protective undergarments: Yes  Type: incontinence 3; Number used/day: 1-4 ?Fluid Intake: 40+ oz H20, 2 coffee caffeinated, 1x juices/sodas/teas ?Nocturia: 2x/night ?Frequency of urination: every 1-3 hours ?Pain with urination: Negative ?Difficulty initiating urination: Negative ?Intermittent stream: Negative ?Frequent UTI: Negative.  ? ?Gastrointestinal History: ?Bristol Stool Chart: Type 4-5 (with miralax use 5-7x/day) ?Frequency of BMs: 1-3x/day ?Pain with defecation: Negative ?Straining with defecation: Positive ?Hemorrhoids: Negative ; internal/external; active/latent ?  Toileting posture: feet flat ?Incontinence: Negative.  ? ?Sexual activity/pain: ?No expressed concerns. ? ?Location of pain: n/a ? ?Current activities:  [place], gardens in the warmer seasons, walking ? ?Patient Goals:  ?Control leakage; work toward a complete bowel movement ? ? ?OBJECTIVE ? ?Mental Status ?Patient is oriented to person, place and time.  ?Recent memory is intact.   ?Remote memory is intact.  ?Attention span and concentration are intact.  ?Expressive speech is intact.  ?Patient's fund of knowledge is within normal limits for educational level. ? ?POSTURE/OBSERVATIONS:  ?Lumbar lordosis: WNL ?Thoracic kyphosis: minimally increased ?Iliac crest height:R appearing elevated, not formally assessed ?Lumbar lateral shift: not formally assessed ?Pelvic obliquity: not formally assessed ?Leg length discrepancy: not formally assessed ? ?GAIT: ?Grossly WFL. No apparent deficits; may benefit from more thorough future assessment. ? ?RANGE OF MOTION: deferred 2/2 to time constraints ?  LEFT RIGHT  ?Lumbar forward flexion (65):      ?Lumbar extension (30):     ?Lumbar lateral flexion (25):     ?Thoracic and Lumbar rotation (30 degrees):       ?Hip Flexion (0-125):      ?Hip IR (0-45):     ?Hip ER (0-45):     ?Hip Abduction (0-40):     ?Hip extension (0-15):     ? ? ?SENSATION: ?deferred 2/2 to time constraints ? ?STRENGTH: MMT deferred 2/2 to time constraints ? RLE LLE  ?Hip Flexion    ?Hip Extension    ?Hip Abduction     ?Hip Adduction     ?Hip ER     ?Hip IR     ?Knee Extension    ?Knee Flexion    ?Dorsiflexion     ?Plantarflexion (seated)    ? ?ABDOMINAL: deferred 2/2 to time constraints ?Palpation: ?Diastasis: ?Scar mobility: ?Rib flare: ? ?SPECIAL TESTS: deferred 2/2 to time constraints ? ? ?PHYSICAL PERFORMANCE MEASURES: ?STS: WFL ?Deep Squat: ?RLE STS: ?LLE STS:  ?6MWT: ?5TSTS:   ? ?EXTERNAL PELVIC EXAM: deferred 2/2 to time constraints ?Palpation: ?Breath coordination: ?Voluntary Contraction: present/absent ?Relaxation: full/delayed/non-relaxing ?Perineal movement with sustained IAP increase ("bear down"): descent/no change/elevation/excessive descent ?Perineal movement with rapid IAP increase ("cough"): elevation/no change/descent ? ?INTERNAL VAGINAL EXAM: deferred 2/2 to time constraints ?Introitus Appears:  ?Skin integrity:  ?Scar mobility: ?Strength (PERF):   ?Symmetry: ?Palpation: ?Prolapse: ? ? ?INTERNAL RECTAL EXAM: not indicated ?Strength (PERF): ?Symmetry: ?Palpation: ?Prolapse: ? ? ?OUTCOME MEASURES: FOTO (Urinary Problem 46, PFDI Bowel 8, PFDI Urinary 21) ? ? ?ASSESSMENT ?Patient is a 88 year old presenting to clinic with chief complaints of incomplete evacuation of bowels and urinary incontinence. Today's evaluation is suggestive of deficits in PFM coordination, PFM strength, and bowel and bladder behaviors as evidenced by straining with bowel movements while using miralax, enuresis, nocturnal urge urinary incontinence, post-void incontinence, and urgency and UUI on returning home. Patient's responses on FOTO outcome measures (Urinary Problem 46, PFDI Bowel 8, PFDI Urinary 21) indicate moderate functional limitations/disability/distress. Patient's progress may be limited due to age; however, patient's motivation, overall health, and successful application of urge suppression strategies are advantageous. Patient will benefit from continued skilled therapeutic intervention to address deficits in PFM coordination, PFM strength, and bowel and bladder behaviors in order to increase function and improve overall QOL. ? ?EDUCATION ?Patient educated on prognosis, POC, and provided with HEP including: toileting posture, toileting techniques. Patient articulated understanding and returned demonstration. Patient will benefit from further education in order to maximize compliance and understanding for long-term therapeutic  gains. ? ? ? ? ? ? ? ?Objective measurements completed on examination: See above findings.  ? ? ? ? ? ? ? ? ? ? ? ? ? ? ? ? ? ? ? PT Long Term Goals - 06/10/21 1135   ? ?  ? PT LONG TERM GOAL #1  ? Title Patient will demonstrate independence with HEP in order to maximize therapeutic gains and improve carryover from physical therapy sessions to ADLs in the home and community.   ? Baseline IE: not initiated   ? Time 8   ? Period Weeks   ? Status New   ?  Target Date 08/05/21   ?  ? PT LONG TERM GOAL #2  ? Title Patient will report decreased reliance on protective undergarments as indicated by < 2/24 hour period consistently over a 2 week timeframe to demonstrate improved bladder con

## 2021-06-17 ENCOUNTER — Encounter: Payer: PPO | Admitting: Physical Therapy

## 2021-06-18 ENCOUNTER — Other Ambulatory Visit: Payer: Self-pay

## 2021-06-18 ENCOUNTER — Encounter: Payer: Self-pay | Admitting: Podiatry

## 2021-06-18 ENCOUNTER — Ambulatory Visit: Payer: PPO | Admitting: Podiatry

## 2021-06-18 DIAGNOSIS — B351 Tinea unguium: Secondary | ICD-10-CM

## 2021-06-18 DIAGNOSIS — M79674 Pain in right toe(s): Secondary | ICD-10-CM | POA: Diagnosis not present

## 2021-06-18 DIAGNOSIS — M79675 Pain in left toe(s): Secondary | ICD-10-CM | POA: Diagnosis not present

## 2021-06-18 NOTE — Progress Notes (Signed)
?  Subjective:  Patient ID: Cassie Brewer, female    DOB: 05/20/1928,  MRN: [id number]  Chief Complaint  Patient presents with   Nail Problem    Nail trim    88 y.o. female returns for the above complaint.  Patient presents with thickened elongated mycotic toenails x10.  Patient is especially concerned for right hallux onychomycosis.  Patient not able to debride on herself.  She would like for us to debride down.  She does not have any secondary complaints. Objective:   There were no vitals filed for this visit. Podiatric Exam: Vascular: dorsalis pedis and posterior tibial pulses are palpable bilateral. Capillary return is immediate. Temperature gradient is WNL. Skin turgor WNL  Sensorium: Normal Semmes Weinstein monofilament test. Normal tactile sensation bilaterally.  However she experiences subjective neuropathic pain to the distal tip of the toes.  Intact sensation Nail Exam: Pt has thick disfigured discolored nails with subungual debris noted bilateral entire nail hallux through fifth toenails.  Ingrown noted to the right hallux lateral border.  Mild pain on palpation. Ulcer Exam: There is no evidence of ulcer or pre-ulcerative changes or infection. Orthopedic Exam: Muscle tone and strength are WNL. No limitations in general ROM. No crepitus or effusions noted. HAV  B/L.  Hammer toes 2-5  B/L. Skin: No Porokeratosis. No infection or ulcers.  Xerosis/psoriasis of the plantar foot with now subjective component of itching..  There is mild cracking noted.  No bleeding noted.  Assessment & Plan:  Patient was evaluated and treated and all questions answered.  Athlete's foot bilateral -Explained to patient the etiology of athlete's foot and various treatment options were discussed.  Given that there is subjective complaint of itching associated with the bottom of the foot I believe she will benefit from Lotrisone cream.  I have asked her to apply twice a day.  She states  understanding.  Neuropathic pain -I explained to the patient the etiology of neuropathic pain and various treatment options were extensively discussed.  I believe she will benefit from neuropathic cream to help decrease the pain associated with this -Prescription for neuropathic cream was sent to [facility].  Xerosis/psoriasis -I explained to the patient the etiology of dryness with cracking as well as various treatment options associated with it.  Given that she is doing good with clotrimazole cream that was given to her by dermatologist and had diagnosed her with psoriasis, I believe she can continue using the cream.  Patient agrees with this plan.  Onychomycosis with pain  -Nails palliatively debrided as below. -Educated on self-care  Procedure: Nail Debridement Rationale: pain  Type of Debridement: manual, sharp debridement. Instrumentation: Nail nipper, rotary burr. Number of Nails: 10  Procedures and Treatment: Consent by patient was obtained for treatment procedures. The patient understood the discussion of treatment and procedures well. All questions were answered thoroughly reviewed. Debridement of mycotic and hypertrophic toenails, 1 through 5 bilateral and clearing of subungual debris. No ulceration, no infection noted.  Return Visit-Office Procedure: Patient instructed to return to the office for a follow up visit 3 months for continued evaluation and treatment.  Maki Hege, DPM    No follow-ups on file. 

## 2021-06-24 ENCOUNTER — Encounter: Payer: Self-pay | Admitting: Physical Therapy

## 2021-06-24 ENCOUNTER — Other Ambulatory Visit: Payer: Self-pay

## 2021-06-24 ENCOUNTER — Ambulatory Visit: Payer: PPO | Admitting: Physical Therapy

## 2021-06-24 DIAGNOSIS — M6281 Muscle weakness (generalized): Secondary | ICD-10-CM | POA: Diagnosis not present

## 2021-06-24 DIAGNOSIS — R278 Other lack of coordination: Secondary | ICD-10-CM

## 2021-06-24 NOTE — Therapy (Signed)
?OUTPATIENT PHYSICAL THERAPY TREATMENT NOTE ? ? ?Patient Name: Cassie Brewer ?MRN: [id number] ?DOB:April 07, 1928, 88 y.o., female ?Today's Date: 06/24/2021 ? ?PCP: Valerie Roys, DO ?REFERRING PROVIDER: Valerie Roys, DO ? ? PT End of Session - 06/24/21 1036   ? ? Visit Number 2   ? Number of Visits 8   ? Date for PT Re-Evaluation 08/05/21   ? Authorization Type IE 06/10/2021   ? PT Start Time 1030   ? PT Stop Time 1110   ? PT Time Calculation (min) 40 min   ? Activity Tolerance Patient tolerated treatment well   ? Behavior During Therapy [facility] for tasks assessed/performed   ? ?  ?  ? ?  ? ? ?Past Medical History:  ?Diagnosis Date  ? Glaucoma   ? Hyperlipidemia   ? Hypertension   ? Hypothyroidism 10/02/2014  ? Overactive bladder   ? Squamous cell skin cancer, face   ? ?Past Surgical History:  ?Procedure Laterality Date  ? BREAST SURGERY    ? THYROIDECTOMY, PARTIAL Right   ? TOTAL ABDOMINAL HYSTERECTOMY    ? ?Patient Active Problem List  ? Diagnosis Date Noted  ? Constipation 03/06/2021  ? Aphasia 03/21/2020  ? Aphasia S/P CVA 03/11/2020  ? TIA (transient ischemic attack) 03/10/2020  ? Arthritis of foot 04/24/2015  ? Arthropathy 04/24/2015  ? Benign hypertensive renal disease 10/02/2014  ? Hypothyroidism 10/02/2014  ? Hyperlipidemia 10/02/2014  ? CKD (chronic kidney disease), stage III ([place]) 10/02/2014  ? ? ?REFERRING DIAG: K59.00 (ICD-10-CM) - Constipation, unspecified constipation type  ? ?THERAPY DIAG:  ?Muscle weakness (generalized) ? ?Other lack of coordination ? ? ?SUBJECTIVE: Patient notes that she continues to have soft BMs but is unable to get stool out easily. She states it feels like she can't get the gate open.  ? ?PAIN:  ?Are you having pain? No ?TREATMENT ? ?Pre-treatment assessment: ?RANGE OF MOTION:  ?  LEFT RIGHT  ?Lumbar forward flexion (65):  WNL    ?Lumbar extension (30): ~20 dgerees    ?Lumbar lateral flexion (25):  WNL WNL  ?Thoracic and Lumbar rotation (30 degrees):    WNL WNL  ?Hip Flexion  (0-125):   WNL WNL  ?Hip IR (0-45):  WNL WNL  ?Hip ER (0-45):  WNL WNL  ?Hip Abduction (0-40):  WNL WNL  ?Hip extension (0-15):  WNL WNL  ? ?STRENGTH: MMT  ? RLE LLE  ?Hip Flexion 4 4  ?Hip Abduction  5 5  ?Hip Adduction  5 5  ?Hip ER  5 5  ?Hip IR  5 5  ? ?ABDOMINAL:  ?Palpation: mild TTP at left lower flexure ?Diastasis: none noted ?Rib flare: none noted ? ?SPECIAL TESTS: ?SLR (SN 92, -LR 0.29): R: Negative L:  Negative ?FABER (SN 81): R: Negative L: Negative ?FADIR (SN 94): R: Negative L: Negative ? ? ?EXTERNAL PELVIC EXAM: Patient educated on the purpose of the pelvic exam and articulated understanding; patient consented to the exam verbally. ?Breath coordination: present ?Cued Lengthen: unable to coordinate without anterior pelvic tilt and valsalva maneuver ?Cued Contraction: perineal ascent; roughly 2-3/5, 3 reps; unable to hold for greater than 1 sec duration ?Cued Relaxation: delayed ?Cough: perineal descent ? ?Manual Therapy: ?Colon massage with patient education on GI anatomy and typical function. Patient provided with handout for home practice.  ? ?Neuromuscular Re-education: ?Supine hooklying diaphragmatic breathing with VCs and TCs for downregulation of the nervous system and improved management of IAP ?Supine hooklying, PFM lengthening with inhalation. VCs  and TCs to decrease compensatory patterns and encourage optimal relaxation of the PFM. ? ? ?Therapeutic Exercise: ? ? ?Treatments unbilled: ? ?Post-treatment assessment: ? ?Patient educated throughout session on appropriate technique and form using multi-modal cueing, HEP, and activity modification. Patient articulated understanding and returned demonstration. ? ?Patient Response to interventions: ?Comfortable to apply bowel massage to home practice. ? ?ASSESSMENT ?Patient presents to clinic with excellent motivation to participate in therapy. Patient demonstrates deficits in PFM coordination, PFM strength, and bowel and bladder behaviors. Patient  able to achieve PFM length with coordinated breath and tactile cueing during today's session and responded positively to manual interventions. Patient will benefit from continued skilled therapeutic intervention to address remaining deficits in PFM coordination, PFM strength, and bowel and bladder behaviors in order to increase function and improve overall QOL. ? ? PT Long Term Goals  ? ?  ? PT LONG TERM GOAL #1  ? Title Patient will demonstrate independence with HEP in order to maximize therapeutic gains and improve carryover from physical therapy sessions to ADLs in the home and community.   ? Baseline IE: not initiated   ? Time 8   ? Period Weeks   ? Status New   ? Target Date 08/05/21   ?  ? PT LONG TERM GOAL #2  ? Title Patient will report decreased reliance on protective undergarments as indicated by < 2/24 hour period consistently over a 2 week timeframe to demonstrate improved bladder control and allow for increased participation in activities outside of the home.   ? Baseline IE: 1-4, type 3/24 hour period   ? Time 8   ? Period Weeks   ? Status New   ? Target Date 08/05/21   ?  ? PT LONG TERM GOAL #3  ? Title Patient will demonstrate improved function as evidenced by a score of 54 on FOTO measure for full participation in activities at home and in the community.   ? Baseline IE: 46   ? Time 8   ? Period Weeks   ? Status New   ? Target Date 08/05/21   ?  ? PT LONG TERM GOAL #4  ? Title Patient will demonstrate circumferential and sequential contraction of >3/5 MMT, > 5 sec hold x5 and 5 consecutive quick flicks with </= 10 min rest between testing bouts, and relaxation of the PFM coordinated with breath for improved management of intra-abdominal pressure and normal bowel and bladder function without the presence of pain nor incontinence in order to improve participation at home and in the community.   ? Baseline IE: not assessed at this visit   ? Time 8   ? Period Weeks   ? Status New   ? Target Date  08/05/21   ? ?  ?  ? ?  ? ? Plan   ? ? Clinical Impression Statement Patient presents to clinic with excellent motivation to participate in therapy. Patient demonstrates deficits in PFM coordination, PFM strength, and bowel and bladder behaviors. Patient able to achieve PFM length with coordinated breath and tactile cueing during today's session and responded positively to manual interventions. Patient will benefit from continued skilled therapeutic intervention to address remaining deficits in PFM coordination, PFM strength, and bowel and bladder behaviors in order to increase function and improve overall QOL.   ? Personal Factors and Comorbidities Age;Comorbidity 3+;Time since onset of injury/illness/exacerbation;Behavior Pattern   ? Comorbidities HTN, hyperlipidemia, hypothyroidism, OAB, CKD, hx of CVA   ? Examination-Activity Limitations Toileting   ?  Examination-Participation Restrictions Community Activity;Other   ? Stability/Clinical Decision Making Evolving/Moderate complexity   ? Rehab Potential Fair   ? PT Frequency 1x / week   ? PT Duration 8 weeks   ? PT Treatment/Interventions ADLs/Self Care Home Management;Cryotherapy;Moist Heat;Electrical Stimulation;Therapeutic exercise;Neuromuscular re-education;Patient/family education;Orthotic Fit/Training;Manual techniques;Taping   ? PT Home Exercise Plan toileting posture; handouts provided on techniques for improved evacuation of bowels   ? Consulted and Agree with Plan of Care Patient   ? ?  ?  ? ?  ?  ?Myles Gip PT, DPT [phone]  ?06/24/2021, 11:35 AM ? ?  ? ?

## 2021-07-01 ENCOUNTER — Encounter: Payer: Self-pay | Admitting: Physical Therapy

## 2021-07-01 ENCOUNTER — Ambulatory Visit: Payer: PPO | Attending: Family Medicine | Admitting: Physical Therapy

## 2021-07-01 DIAGNOSIS — M6281 Muscle weakness (generalized): Secondary | ICD-10-CM | POA: Diagnosis not present

## 2021-07-01 DIAGNOSIS — R278 Other lack of coordination: Secondary | ICD-10-CM | POA: Insufficient documentation

## 2021-07-01 NOTE — Therapy (Signed)
?OUTPATIENT PHYSICAL THERAPY TREATMENT NOTE ? ? ?Patient Name: Cassie Brewer ?MRN: [id number] ?DOB:11/07/1928, 88 y.o., female ?Today's Date: 07/01/2021 ? ?PCP: Valerie Roys, DO ?REFERRING PROVIDER: Valerie Roys, DO ? ? PT End of Session - 07/01/21 1028   ? ? Visit Number 3   ? Number of Visits 8   ? Date for PT Re-Evaluation 08/05/21   ? Authorization Type IE 06/10/2021   ? PT Start Time 1030   ? PT Stop Time 1110   ? PT Time Calculation (min) 40 min   ? Activity Tolerance Patient tolerated treatment well   ? Behavior During Therapy [facility] for tasks assessed/performed   ? ?  ?  ? ?  ? ? ?Past Medical History:  ?Diagnosis Date  ? Glaucoma   ? Hyperlipidemia   ? Hypertension   ? Hypothyroidism 10/02/2014  ? Overactive bladder   ? Squamous cell skin cancer, face   ? ?Past Surgical History:  ?Procedure Laterality Date  ? BREAST SURGERY    ? THYROIDECTOMY, PARTIAL Right   ? TOTAL ABDOMINAL HYSTERECTOMY    ? ?Patient Active Problem List  ? Diagnosis Date Noted  ? Constipation 03/06/2021  ? Aphasia 03/21/2020  ? Aphasia S/P CVA 03/11/2020  ? TIA (transient ischemic attack) 03/10/2020  ? Arthritis of foot 04/24/2015  ? Arthropathy 04/24/2015  ? Benign hypertensive renal disease 10/02/2014  ? Hypothyroidism 10/02/2014  ? Hyperlipidemia 10/02/2014  ? CKD (chronic kidney disease), stage III ([place]) 10/02/2014  ? ? ?REFERRING DIAG: K59.00 (ICD-10-CM) - Constipation, unspecified constipation type  ? ?THERAPY DIAG:  ?Muscle weakness (generalized) ? ?Other lack of coordination ? ? ?SUBJECTIVE: Patient notes that she continues to have regular BMs and has gone twice this morning. Patient had Miralax Friday and Monday. Patient notes that urinary leakage has been about the same. She got up twice last night to void but did not have any UI with nocturnal voids.  ? ?PAIN:  ?Are you having pain? No ?TREATMENT ? ?Pre-treatment assessment: ?Manual Therapy: ? ? ?Neuromuscular Re-education: ?Patient performed supine postural control  exercises/deep core coordination series as below. VCs and TCs for improved control and mm activation as needed: ? - Supine Lower Trunk Rotation  - 10 reps ?- Supine Posterior Pelvic Tilt  - 10 reps ?- Supine Diaphragmatic Breathing  - 10 reps ?- Hooklying Transversus Abdominis Palpation  - 10 reps ?- Bent Knee Fallouts  - 10 reps ? ? ?Therapeutic Exercise: ? ? ?Treatments unbilled: ? ?Post-treatment assessment: ? ?Patient educated throughout session on appropriate technique and form using multi-modal cueing, HEP, and activity modification. Patient articulated understanding and returned demonstration. ? ?Patient Response to interventions: ?Comfortable to add new deep core program to HEP ? ? ? ? PT Long Term Goals  ? ?  ? PT LONG TERM GOAL #1  ? Title Patient will demonstrate independence with HEP in order to maximize therapeutic gains and improve carryover from physical therapy sessions to ADLs in the home and community.   ? Baseline IE: not initiated   ? Time 8   ? Period Weeks   ? Status New   ? Target Date 08/05/21   ?  ? PT LONG TERM GOAL #2  ? Title Patient will report decreased reliance on protective undergarments as indicated by < 2/24 hour period consistently over a 2 week timeframe to demonstrate improved bladder control and allow for increased participation in activities outside of the home.   ? Baseline IE: 1-4, type 3/24 hour period   ?  Time 8   ? Period Weeks   ? Status New   ? Target Date 08/05/21   ?  ? PT LONG TERM GOAL #3  ? Title Patient will demonstrate improved function as evidenced by a score of 54 on FOTO measure for full participation in activities at home and in the community.   ? Baseline IE: 46   ? Time 8   ? Period Weeks   ? Status New   ? Target Date 08/05/21   ?  ? PT LONG TERM GOAL #4  ? Title Patient will demonstrate circumferential and sequential contraction of >3/5 MMT, > 5 sec hold x5 and 5 consecutive quick flicks with </= 10 min rest between testing bouts, and relaxation of the PFM  coordinated with breath for improved management of intra-abdominal pressure and normal bowel and bladder function without the presence of pain nor incontinence in order to improve participation at home and in the community.   ? Baseline IE: 2/5, 2-3x, 1 sec x1  ? Time 8   ? Period Weeks   ? Status New   ? Target Date 08/05/21   ? ?  ?  ? ?  ? ? Plan   ? ? Clinical Impression Statement Patient presents to clinic with excellent motivation to participate in therapy. Patient demonstrates deficits in PFM coordination, PFM strength, and bowel and bladder behaviors. Patient with excellent form and body awareness with supine postural interventions during today's session and responded positively to active interventions. Patient will benefit from continued skilled therapeutic intervention to address remaining deficits in PFM coordination, PFM strength, and bowel and bladder behaviors in order to increase function and improve overall QOL.   ? Personal Factors and Comorbidities Age;Comorbidity 3+;Time since onset of injury/illness/exacerbation;Behavior Pattern   ? Comorbidities HTN, hyperlipidemia, hypothyroidism, OAB, CKD, hx of CVA   ? Examination-Activity Limitations Toileting   ? Examination-Participation Restrictions Community Activity;Other   ? Stability/Clinical Decision Making Evolving/Moderate complexity   ? Rehab Potential Fair   ? PT Frequency 1x / week   ? PT Duration 8 weeks   ? PT Treatment/Interventions ADLs/Self Care Home Management;Cryotherapy;Moist Heat;Electrical Stimulation;Therapeutic exercise;Neuromuscular re-education;Patient/family education;Orthotic Fit/Training;Manual techniques;Taping   ? PT Home Exercise Plan toileting posture; handouts provided on techniques for improved evacuation of bowels   ? Consulted and Agree with Plan of Care Patient   ? ?  ?  ? ?  ?  ?Myles Gip PT, DPT [phone]  ?07/01/2021, 10:28 AM ? ?  ? ?

## 2021-07-08 ENCOUNTER — Ambulatory Visit: Payer: PPO | Admitting: Physical Therapy

## 2021-07-08 ENCOUNTER — Encounter: Payer: Self-pay | Admitting: Physical Therapy

## 2021-07-08 DIAGNOSIS — M6281 Muscle weakness (generalized): Secondary | ICD-10-CM | POA: Diagnosis not present

## 2021-07-08 DIAGNOSIS — R278 Other lack of coordination: Secondary | ICD-10-CM

## 2021-07-08 NOTE — Therapy (Signed)
?OUTPATIENT PHYSICAL THERAPY TREATMENT NOTE ? ? ?Patient Name: Cassie Brewer ?MRN: [id number] ?DOB:[birth year], 88 y.o., female ?Today's Date: 07/08/2021 ? ?PCP: Valerie Roys, DO ?REFERRING PROVIDER: Valerie Roys, DO ? ? PT End of Session - 07/08/21 1031   ? ? Visit Number 4   ? Number of Visits 8   ? Date for PT Re-Evaluation 08/05/21   ? Authorization Type IE 06/10/2021   ? PT Start Time 1030   ? PT Stop Time 1110   ? PT Time Calculation (min) 40 min   ? Activity Tolerance Patient tolerated treatment well   ? Behavior During Therapy [facility] for tasks assessed/performed   ? ?  ?  ? ?  ? ? ?Past Medical History:  ?Diagnosis Date  ? Glaucoma   ? Hyperlipidemia   ? Hypertension   ? Hypothyroidism 10/02/2014  ? Overactive bladder   ? Squamous cell skin cancer, face   ? ?Past Surgical History:  ?Procedure Laterality Date  ? BREAST SURGERY    ? THYROIDECTOMY, PARTIAL Right   ? TOTAL ABDOMINAL HYSTERECTOMY    ? ?Patient Active Problem List  ? Diagnosis Date Noted  ? Constipation 03/06/2021  ? Aphasia 03/21/2020  ? Aphasia S/P CVA 03/11/2020  ? TIA (transient ischemic attack) 03/10/2020  ? Arthritis of foot 04/24/2015  ? Arthropathy 04/24/2015  ? Benign hypertensive renal disease 10/02/2014  ? Hypothyroidism 10/02/2014  ? Hyperlipidemia 10/02/2014  ? CKD (chronic kidney disease), stage III ([place]) 10/02/2014  ? ? ?REFERRING DIAG: K59.00 (ICD-10-CM) - Constipation, unspecified constipation type  ? ?THERAPY DIAG:  ?Muscle weakness (generalized) ? ?Other lack of coordination ? ? ?SUBJECTIVE: Patient states that she had a nice weekend with family. Patient reports that she has been doing the exercises and finds tilts most difficult. She does have increased pain from gardening activities yesterday.  ? ?PAIN:  ?Are you having pain? Yes ?NPRS: 3/10 lumbar, ache ? ?TREATMENT ? ?Pre-treatment assessment: ?Manual Therapy: ? ? ?Neuromuscular Re-education: ?- Hooklying Clamshell with Resistance  - 3 sets - 10 reps ?- Supine Hip Adduction  Isometric with Ball  - 3 sets - 10 reps ?- Supine Pelvic Floor Contraction  - 4 reps - 4 sec hold ? ? ?Therapeutic Exercise: ? ? ?Treatments unbilled: ? ?Post-treatment assessment: ? ?Patient educated throughout session on appropriate technique and form using multi-modal cueing, HEP, and activity modification. Patient articulated understanding and returned demonstration. ? ?Patient Response to interventions: ?Comfortable with new exercises ? ? ? ? PT Long Term Goals  ? ?  ? PT LONG TERM GOAL #1  ? Title Patient will demonstrate independence with HEP in order to maximize therapeutic gains and improve carryover from physical therapy sessions to ADLs in the home and community.   ? Baseline IE: not initiated   ? Time 8   ? Period Weeks   ? Status New   ? Target Date 08/05/21   ?  ? PT LONG TERM GOAL #2  ? Title Patient will report decreased reliance on protective undergarments as indicated by < 2/24 hour period consistently over a 2 week timeframe to demonstrate improved bladder control and allow for increased participation in activities outside of the home.   ? Baseline IE: 1-4, type 3/24 hour period   ? Time 8   ? Period Weeks   ? Status New   ? Target Date 08/05/21   ?  ? PT LONG TERM GOAL #3  ? Title Patient will demonstrate improved function as evidenced by  a score of 54 on FOTO measure for full participation in activities at home and in the community.   ? Baseline IE: 46   ? Time 8   ? Period Weeks   ? Status New   ? Target Date 08/05/21   ?  ? PT LONG TERM GOAL #4  ? Title Patient will demonstrate circumferential and sequential contraction of >3/5 MMT, > 5 sec hold x5 and 5 consecutive quick flicks with </= 10 min rest between testing bouts, and relaxation of the PFM coordinated with breath for improved management of intra-abdominal pressure and normal bowel and bladder function without the presence of pain nor incontinence in order to improve participation at home and in the community.   ? Baseline IE: 2/5,  2-3x, 1 sec x1  ? Time 8   ? Period Weeks   ? Status New   ? Target Date 08/05/21   ? ?  ?  ? ?  ? ? Plan   ? ? Clinical Impression Statement Patient presents to clinic with excellent motivation to participate in therapy. Patient demonstrates deficits in PFM coordination, PFM strength, and bowel and bladder behaviors. Patient appreciative of new hip strengthening/coordination and PFM strengthening/coordination interventions during today's session and responded positively to active interventions. Patient will benefit from continued skilled therapeutic intervention to address remaining deficits in PFM coordination, PFM strength, and bowel and bladder behaviors in order to increase function and improve overall QOL.   ? Personal Factors and Comorbidities Age;Comorbidity 3+;Time since onset of injury/illness/exacerbation;Behavior Pattern   ? Comorbidities HTN, hyperlipidemia, hypothyroidism, OAB, CKD, hx of CVA   ? Examination-Activity Limitations Toileting   ? Examination-Participation Restrictions Community Activity;Other   ? Stability/Clinical Decision Making Evolving/Moderate complexity   ? Rehab Potential Fair   ? PT Frequency 1x / week   ? PT Duration 8 weeks   ? PT Treatment/Interventions ADLs/Self Care Home Management;Cryotherapy;Moist Heat;Electrical Stimulation;Therapeutic exercise;Neuromuscular re-education;Patient/family education;Orthotic Fit/Training;Manual techniques;Taping   ? PT Home Exercise Plan toileting posture; handouts provided on techniques for improved evacuation of bowels   ? Consulted and Agree with Plan of Care Patient   ? ?  ?  ? ?  ?  Myles Gip PT, DPT [phone]  ?07/08/2021, 10:32 AM ? ?  ? ?

## 2021-07-15 ENCOUNTER — Ambulatory Visit: Payer: PPO | Admitting: Physical Therapy

## 2021-07-15 ENCOUNTER — Encounter: Payer: Self-pay | Admitting: Physical Therapy

## 2021-07-15 DIAGNOSIS — R278 Other lack of coordination: Secondary | ICD-10-CM

## 2021-07-15 DIAGNOSIS — M6281 Muscle weakness (generalized): Secondary | ICD-10-CM | POA: Diagnosis not present

## 2021-07-15 NOTE — Therapy (Signed)
?OUTPATIENT PHYSICAL THERAPY TREATMENT NOTE ? ? ?Patient Name: Cassie Brewer ?MRN: [id number] ?DOB:05-03-1928, 88 y.o., female ?Today's Date: 07/15/2021 ? ?PCP: Valerie Roys, DO ?REFERRING PROVIDER: Valerie Roys, DO ? ? PT End of Session - 07/15/21 1042   ? ? Visit Number 5   ? Number of Visits 8   ? Date for PT Re-Evaluation 08/05/21   ? Authorization Type IE 06/10/2021   ? PT Start Time 1035   ? PT Stop Time 1120   ? PT Time Calculation (min) 45 min   ? Activity Tolerance Patient tolerated treatment well   ? Behavior During Therapy [facility] for tasks assessed/performed   ? ?  ?  ? ?  ? ? ?Past Medical History:  ?Diagnosis Date  ? Glaucoma   ? Hyperlipidemia   ? Hypertension   ? Hypothyroidism 10/02/2014  ? Overactive bladder   ? Squamous cell skin cancer, face   ? ?Past Surgical History:  ?Procedure Laterality Date  ? BREAST SURGERY    ? THYROIDECTOMY, PARTIAL Right   ? TOTAL ABDOMINAL HYSTERECTOMY    ? ?Patient Active Problem List  ? Diagnosis Date Noted  ? Constipation 03/06/2021  ? Aphasia 03/21/2020  ? Aphasia S/P CVA 03/11/2020  ? TIA (transient ischemic attack) 03/10/2020  ? Arthritis of foot 04/24/2015  ? Arthropathy 04/24/2015  ? Benign hypertensive renal disease 10/02/2014  ? Hypothyroidism 10/02/2014  ? Hyperlipidemia 10/02/2014  ? CKD (chronic kidney disease), stage III ([place]) 10/02/2014  ? ? ?REFERRING DIAG: K59.00 (ICD-10-CM) - Constipation, unspecified constipation type  ? ?THERAPY DIAG:  ?No diagnosis found. ? ? ?SUBJECTIVE: Patient reports continued difficulty with BMs. Patient notes that this morning she felt increased urge but was unable to push stool out initially. Patient has been 3x this morning but does not feel completely empty. Patient states that she has not tried elevating feet.  ? ?PAIN:  ?Are you having pain? Yes ?NPRS: 3/10 lumbar, ache ? ?TREATMENT ? ?Pre-treatment assessment: ?EXTERNAL PELVIC EXAM: Patient educated on the purpose of the pelvic exam and articulated understanding;  patient consented to the exam verbally. ?Breath coordination: present ?Cued Lengthen: perineal descent, able to sustain position for 4 seconds ?Cued Contraction: perineal ascent; roughly 3/5, 6 sec holdx 3 reps ?Cued Relaxation: full ? ?Manual Therapy: ? ? ?Neuromuscular Re-education: ?Sidelying diaphragmatic breathing with VCs and TCs for downregulation of the nervous system and improved management of IAP ?Supine hooklying, PFM lengthening with hold to fatigue. VCs and TCs to decrease compensatory patterns and encourage optimal relaxation of the PFM. ?Sidelying, PFM contractions (endurance 6 sec x3) with exhalation. VCs and TCs to decrease compensatory patterns and encourage activation of the PFM. ?Patient education on differences in fast-twitch and slow-twitch muscle fiber training and strength requirements for complete bowel movement.  ? ? ?Therapeutic Exercise: ? ? ?Treatments unbilled: ? ?Post-treatment assessment: ? ?Patient educated throughout session on appropriate technique and form using multi-modal cueing, HEP, and activity modification. Patient articulated understanding and returned demonstration. ? ?Patient Response to interventions: ?Comfortable with new exercises ? ? ? ? PT Long Term Goals  ? ?  ? PT LONG TERM GOAL #1  ? Title Patient will demonstrate independence with HEP in order to maximize therapeutic gains and improve carryover from physical therapy sessions to ADLs in the home and community.   ? Baseline IE: not initiated   ? Time 8   ? Period Weeks   ? Status New   ? Target Date 08/05/21   ?  ?  PT LONG TERM GOAL #2  ? Title Patient will report decreased reliance on protective undergarments as indicated by < 2/24 hour period consistently over a 2 week timeframe to demonstrate improved bladder control and allow for increased participation in activities outside of the home.   ? Baseline IE: 1-4, type 3/24 hour period   ? Time 8   ? Period Weeks   ? Status New   ? Target Date 08/05/21   ?  ? PT  LONG TERM GOAL #3  ? Title Patient will demonstrate improved function as evidenced by a score of 54 on FOTO measure for full participation in activities at home and in the community.   ? Baseline IE: 46   ? Time 8   ? Period Weeks   ? Status New   ? Target Date 08/05/21   ?  ? PT LONG TERM GOAL #4  ? Title Patient will demonstrate circumferential and sequential contraction of >3/5 MMT, > 5 sec hold x5 and 5 consecutive quick flicks with </= 10 min rest between testing bouts, and relaxation of the PFM coordinated with breath for improved management of intra-abdominal pressure and normal bowel and bladder function without the presence of pain nor incontinence in order to improve participation at home and in the community.   ? Baseline IE: 2/5, 2-3x, 1 sec x1  ? Time 8   ? Period Weeks   ? Status New   ? Target Date 08/05/21   ? ?  ?  ? ?  ? ? Plan   ? ? Clinical Impression Statement Patient presents to clinic with excellent motivation to participate in therapy. Patient demonstrates deficits in PFM coordination, PFM strength, and bowel and bladder behaviors. Patient with much improved PFM strength and coordination during today's session and responded positively to progressions of PFM coordination/endurance interventions. Patient will benefit from continued skilled therapeutic intervention to address remaining deficits in PFM coordination, PFM strength, and bowel and bladder behaviors in order to increase function and improve overall QOL.   ? Personal Factors and Comorbidities Age;Comorbidity 3+;Time since onset of injury/illness/exacerbation;Behavior Pattern   ? Comorbidities HTN, hyperlipidemia, hypothyroidism, OAB, CKD, hx of CVA   ? Examination-Activity Limitations Toileting   ? Examination-Participation Restrictions Community Activity;Other   ? Stability/Clinical Decision Making Evolving/Moderate complexity   ? Rehab Potential Fair   ? PT Frequency 1x / week   ? PT Duration 8 weeks   ? PT Treatment/Interventions  ADLs/Self Care Home Management;Cryotherapy;Moist Heat;Electrical Stimulation;Therapeutic exercise;Neuromuscular re-education;Patient/family education;Orthotic Fit/Training;Manual techniques;Taping   ? PT Home Exercise Plan toileting posture; handouts provided on techniques for improved evacuation of bowels   ? Consulted and Agree with Plan of Care Patient   ? ?  ?  ? ?  ?  ?Myles Gip PT, DPT [phone]  ?07/15/2021, 10:42 AM ? ?  ? ?

## 2021-07-22 ENCOUNTER — Encounter: Payer: PPO | Admitting: Physical Therapy

## 2021-07-28 ENCOUNTER — Ambulatory Visit (INDEPENDENT_AMBULATORY_CARE_PROVIDER_SITE_OTHER): Payer: PPO

## 2021-07-28 ENCOUNTER — Telehealth: Payer: PPO

## 2021-07-28 DIAGNOSIS — R32 Unspecified urinary incontinence: Secondary | ICD-10-CM

## 2021-07-28 DIAGNOSIS — I10 Essential (primary) hypertension: Secondary | ICD-10-CM

## 2021-07-28 DIAGNOSIS — E039 Hypothyroidism, unspecified: Secondary | ICD-10-CM

## 2021-07-28 DIAGNOSIS — K5900 Constipation, unspecified: Secondary | ICD-10-CM

## 2021-07-28 DIAGNOSIS — E785 Hyperlipidemia, unspecified: Secondary | ICD-10-CM

## 2021-07-28 NOTE — Patient Instructions (Signed)
Visit Information ? ?Thank you for taking time to visit with me today. Please don't hesitate to contact me if I can be of assistance to you before our next scheduled telephone appointment. ? ?Following are the goals we discussed today:  ?RNCM Clinical Goal(s):  ?Patient will verbalize understanding of plan for management of HTN, HLD, Hypothyroidism, and Constipation   ?verbalize basic understanding of HTN, HLD, Hypothyroidism, and Constipation  disease process and self health management plan  ?take all medications exactly as prescribed and will call provider for medication related questions ?demonstrate understanding of rationale for each prescribed medication  ?attend all scheduled medical appointments: 09-07-2021 at 0900 am ?demonstrate improved and ongoing adherence to prescribed treatment plan for HTN, HLD, Hypothyroidism, and Constipation as evidenced by adherence to prescribed medication regimen contacting provider for new or worsened symptoms or questions  ?demonstrate a decrease in HTN, HLD, Hypothyroidism, and constipation  exacerbations  ?demonstrate ongoing self health care management ability to effectively manage chronic conditions  through collaboration with RN Care manager, provider, and care team.  ?  ?Interventions: ?1:1 collaboration with primary care provider regarding development and update of comprehensive plan of care as evidenced by provider attestation and co-signature ?Inter-disciplinary care team collaboration (see longitudinal plan of care) ?Evaluation of current treatment plan related to  self management and patient's adherence to plan as established by provider ?  ?  ?SDOH Barriers (Status: Goal on track: YES.) Long Term Goal (>30 days) ?Patient interviewed and SDOH assessment performed ?       ?SDOH Interventions   ?  ?Flowsheet Row Most Recent Value  ?SDOH Interventions    ?Food Insecurity Interventions Intervention Not Indicated  ?Financial Strain Interventions Intervention Not  Indicated  ?Housing Interventions Intervention Not Indicated  ?Intimate Partner Violence Interventions Intervention Not Indicated  ?Physical Activity Interventions Intervention Not Indicated, Other (Comments)  [active in her yard]  ?Stress Interventions Intervention Not Indicated  ?Social Connections Interventions Intervention Not Indicated  ?Transportation Interventions Intervention Not Indicated  ?  ?   ?Patient interviewed and appropriate assessments performed ?Provided patient with information about changes in SDOH, and resources to help with any needs. Care guides being available to assist with changes  ?Discussed plans with patient for ongoing care management follow up and provided patient with direct contact information for care management team ?Advised patient to call the office for changes in SDOH, new concerns or questions.  ?Saw dermatologist recently and will go back for follow up on some areas of concern the patient has. Will continue to monitor for changes.  ?  ?  ?  ?Hypothyroidism  (Status: Goal on track: YES.) ?Evaluation of current treatment plan related to  Hypothyroidism,  self-management and patient's adherence to plan as established by provider. 01-23-2021: The patient states that she is feeling more tired than usual. She is a little concerned that her thyroid levels are on the low end of normal and said the the specialist always told her that it should be on the higher end of normal. She would like to know from Dr. Wynetta Emery if her levothyroxine dose should be changed. Will collaborate with Dr. Wynetta Emery and send an inbasket message to Dr. Wynetta Emery for recommendations. Reviewed the results with the patient and confirmed that she is taking levothyroxine 112 mcg daily as prescribed. 03-17-2021: The patient states she is doing well. Her thyroid level was normal at recent appointment. The patient states she feels great and denies any new concerns with Thyroid health and well being.  07-28-2021:  The  patient denies any new issues with her thyroid. Is compliant with the plan of care.  ?Discussed plans with patient for ongoing care management follow up and provided patient with direct contact information for care management team ?Advised patient to call the office for changes in condition, questions, or concerns; ?Provided education to patient re: thyroid level being in normal range.  The patient is concerned it is on the low end of normal and was told before it needed to be on the higher range. ; ?Reviewed medications with patient and discussed compliance. The patient is taking levothyroxine 112 mcg as prescribed. 07-28-2021: The patient is compliant with medications; ?Provided patient with tsh educational materials related to hypothyroidism ; ?Reviewed scheduled/upcoming provider appointments including 06-04-2021 at 0820 am, the patient is going in the office today to get her flu vaccine. Next appointment with the pcp is 10-06-2021 at [place] am; ?Discussed plans with patient for ongoing care management follow up and provided patient with direct contact information for care management team; ?Screening for signs and symptoms of depression related to chronic disease state;  ?Assessed social determinant of health barriers;  ?  ?Hyperlipidemia:  (Status: Goal on track: YES.) ?     ?Lab Results  ?Component Value Date  ?  CHOL 171 03/06/2021  ?  HDL 77 03/06/2021  ?  [place] 82 03/06/2021  ?  TRIG 60 03/06/2021  ?  CHOLHDL 3.3 03/11/2020  ?  ?  ?Medication review performed; medication list updated in electronic medical record. 07-28-2021: Lipitor 20 mg daily and is compliant.  ?Provider established cholesterol goals reviewed. 07-28-2021: The patient is doing well and denies any acute findings with HLD and heart health. Will continue to monitor for changes in conditions. ; ?Counseled on importance of regular laboratory monitoring as prescribed. 07-28-2021: The patient has regular lab work; ?Provided HLD Microbiologist; ?Reviewed role and benefits of statin for ASCVD risk reduction; ?Discussed strategies to manage statin-induced myalgias; ?Reviewed importance of limiting foods high in cholesterol. 05-26-2021: the patient is eating well.  Her children visited recently and she states her daughter in law prepared freezer meals for her to have on hand and cook when she wanted them. She denies any issues with compliance with dietary restrictions. 07-28-2021: The patient states that she is eating well. She denies any issues with being compliance with dietary restrictions. ?Last eye exam on 05-25-2021. Will see her eye provider again in 6 months. ?Saw podiatrist in June 18, 2021 ?  ?Hypertension: (Status: Goal on track: YES.) ?Last practice recorded BP readings:  ?   ?BP Readings from Last 3 Encounters:  ?06/04/21 134/75  ?03/06/21 121/75  ?11/28/20 132/80  ?Most recent eGFR/CrCl:  ?     ?Lab Results  ?Component Value Date  ?  EGFR 58 (L) 03/06/2021  ?  No components found for: CRCL ?  ?Evaluation of current treatment plan related to hypertension self management and patient's adherence to plan as established by provider. 03-17-2021: Blood pressures more stable. The patient saw pcp recently and receive a good report. Denies any acute findings and states she is eating well and feeling good.   07-28-2021: Blood pressures are stable. Denies any acute findings. Is compliant with plan of care.  ?Provided education to patient re: stroke prevention, s/s of heart attack and stroke; ?Reviewed prescribed diet Heart Healthy. 07-28-2021: The patient is compliant with a heart healthy diet ?Reviewed medications with patient and discussed importance of compliance. 07-28-2021: The patient is compliant with a heart  healthy diet;  ?Discussed plans with patient for ongoing care management follow up and provided patient with direct contact information for care management team; ?Advised patient, providing education and rationale, to monitor blood pressure  daily and record, calling PCP for findings outside established parameters;  ?Reviewed scheduled/upcoming provider appointments including: 09-07-2021 at 0900 am ?Provided education on prescribed diet Heart healthy. 09-07-2021: Review w

## 2021-07-28 NOTE — Chronic Care Management (AMB) (Signed)
?Chronic Care Management  ? ?CCM RN Visit Note ? ?07/28/2021 ?Name: Cassie Brewer MRN: [id number] DOB: Dec 22, 1928 ? ?Subjective: ?Cassie Brewer is a 88 y.o. year old female who is a primary care patient of Valerie Roys, DO. The care management team was consulted for assistance with disease management and care coordination needs.   ? ?Engaged with patient by telephone for follow up visit in response to provider referral for case management and/or care coordination services.  ? ?Consent to Services:  ?The patient was given information about Chronic Care Management services, agreed to services, and gave verbal consent prior to initiation of services.  Please see initial visit note for detailed documentation.  ? ?Patient agreed to services and verbal consent obtained.  ? ?Assessment: Review of patient past medical history, allergies, medications, health status, including review of consultants reports, laboratory and other test data, was performed as part of comprehensive evaluation and provision of chronic care management services.  ? ?SDOH (Social Determinants of Health) assessments and interventions performed:   ? ?CCM Care Plan ? ?Allergies  ?Allergen Reactions  ? Amoxicillin   ?  Other reaction(s): Other (See Comments) ?"blood pressure very high - almost passed out"  ? Eryc [Erythromycin] Other (See Comments)  ? Sulfa Antibiotics Nausea And Vomiting  ?  Nausea with bactrim in 2017  ?Nausea with bactrim in 2017   ? ? ?Outpatient Encounter Medications as of 07/28/2021  ?Medication Sig  ? aspirin 81 MG EC tablet Take 1 tablet (81 mg total) by mouth daily. Swallow whole.  ? atorvastatin (LIPITOR) 20 MG tablet Take 1 tablet (20 mg total) by mouth daily.  ? benazepril (LOTENSIN) 40 MG tablet Take 1 tablet (40 mg total) by mouth daily.  ? Calcium Carbonate-Vit D-Min (CALCIUM 600+D PLUS MINERALS) 600-400 MG-UNIT TABS Take 1 tablet by mouth as directed.  ? clotrimazole-betamethasone (LOTRISONE) cream Apply 1 application  topically 2 (two) times daily.  ? Desoximetasone (TOPICORT) 0.25 % ointment SMARTSIG:sparingly Topical Twice Daily  ? fluticasone (FLONASE) 50 MCG/ACT nasal spray Place 2 sprays into both nostrils daily as needed for allergies.  ? hydrochlorothiazide (HYDRODIURIL) 25 MG tablet Take 1 tablet (25 mg total) by mouth daily.  ? hydrocortisone (ANUSOL-HC) 2.5 % rectal cream Place 1 application rectally 2 (two) times daily.  ? latanoprost (XALATAN) 0.005 % ophthalmic solution Place 1 drop into both eyes at bedtime.  ? levothyroxine (SYNTHROID) 112 MCG tablet Take 1 tablet (112 mcg total) by mouth daily before breakfast.  ? Multiple Vitamins-Minerals (CENTRUM SILVER ULTRA WOMENS PO) Take 1 tablet by mouth daily.  ? Omega-3 Fatty Acids (FISH OIL) 1000 MG CAPS Take 1,000 mg by mouth daily.  ? polyethylene glycol powder (GLYCOLAX/MIRALAX) 17 GM/SCOOP powder Take 17 g by mouth 2 (two) times daily as needed.  ? timolol (TIMOPTIC) 0.5 % ophthalmic solution Place 1 drop into the left eye daily.  ? vitamin C (ASCORBIC ACID) 500 MG tablet Take 500 mg by mouth daily.  ? ?No facility-administered encounter medications on file as of 07/28/2021.  ? ? ?Patient Active Problem List  ? Diagnosis Date Noted  ? Constipation 03/06/2021  ? Aphasia 03/21/2020  ? Aphasia S/P CVA 03/11/2020  ? TIA (transient ischemic attack) 03/10/2020  ? Arthritis of foot 04/24/2015  ? Arthropathy 04/24/2015  ? Benign hypertensive renal disease 10/02/2014  ? Hypothyroidism 10/02/2014  ? Hyperlipidemia 10/02/2014  ? CKD (chronic kidney disease), stage III ([place]) 10/02/2014  ? ? ?Conditions to be addressed/monitored:HTN, HLD, Hypothyroidism, and constipation, urinary  incontinence ? ?Care Plan : RNCM: General Plan of Care (Adult) for Chronic Disease Manage and Care Coordination Needs  ?Updates made by Vanita Ingles, RN since 07/28/2021 12:00 AM  ?  ? ?Problem: RNCM: Development of Plan of Care for Chronic Disease Management (HTN, HLD, Constipation, Hypothyroidism)    ?Priority: High  ?  ? ?Long-Range Goal: RNCM: Effective Management of Plan of Care for Chronic Disease Management (HTN, HLD, Constipation, Hypothyroidism)   ?Start Date: 01/23/2021  ?Expected End Date: 01/23/2022  ?Priority: High  ?Note:   ?Current Barriers:  ?Knowledge Deficits related to plan of care for management of HTN, HLD, and Hypothyroidism and Constipation  ?Chronic Disease Management support and education needs related to HTN, HLD, and Hypothyroidism and Constipation ? ?RNCM Clinical Goal(s):  ?Patient will verbalize understanding of plan for management of HTN, HLD, Hypothyroidism, and Constipation   ?verbalize basic understanding of HTN, HLD, Hypothyroidism, and Constipation  disease process and self health management plan  ?take all medications exactly as prescribed and will call provider for medication related questions ?demonstrate understanding of rationale for each prescribed medication  ?attend all scheduled medical appointments: 09-07-2021 at 0900 am ?demonstrate improved and ongoing adherence to prescribed treatment plan for HTN, HLD, Hypothyroidism, and Constipation as evidenced by adherence to prescribed medication regimen contacting provider for new or worsened symptoms or questions  ?demonstrate a decrease in HTN, HLD, Hypothyroidism, and constipation  exacerbations  ?demonstrate ongoing self health care management ability to effectively manage chronic conditions  through collaboration with RN Care manager, provider, and care team.  ? ?Interventions: ?1:1 collaboration with primary care provider regarding development and update of comprehensive plan of care as evidenced by provider attestation and co-signature ?Inter-disciplinary care team collaboration (see longitudinal plan of care) ?Evaluation of current treatment plan related to  self management and patient's adherence to plan as established by provider ? ? ?SDOH Barriers (Status: Goal on track: YES.) Long Term Goal (>30 days) ?Patient  interviewed and SDOH assessment performed ?       ?SDOH Interventions   ? ?Flowsheet Row Most Recent Value  ?SDOH Interventions   ?Food Insecurity Interventions Intervention Not Indicated  ?Financial Strain Interventions Intervention Not Indicated  ?Housing Interventions Intervention Not Indicated  ?Intimate Partner Violence Interventions Intervention Not Indicated  ?Physical Activity Interventions Intervention Not Indicated, Other (Comments)  [active in her yard]  ?Stress Interventions Intervention Not Indicated  ?Social Connections Interventions Intervention Not Indicated  ?Transportation Interventions Intervention Not Indicated  ? ?  ?Patient interviewed and appropriate assessments performed ?Provided patient with information about changes in SDOH, and resources to help with any needs. Care guides being available to assist with changes  ?Discussed plans with patient for ongoing care management follow up and provided patient with direct contact information for care management team ?Advised patient to call the office for changes in SDOH, new concerns or questions.  ?Saw dermatologist recently and will go back for follow up on some areas of concern the patient has. Will continue to monitor for changes.  ? ? ? ?Hypothyroidism  (Status: Goal on track: YES.) ?Evaluation of current treatment plan related to  Hypothyroidism,  self-management and patient's adherence to plan as established by provider. 01-23-2021: The patient states that she is feeling more tired than usual. She is a little concerned that her thyroid levels are on the low end of normal and said the the specialist always told her that it should be on the higher end of normal. She would like to know from  Dr. Wynetta Emery if her levothyroxine dose should be changed. Will collaborate with Dr. Wynetta Emery and send an inbasket message to Dr. Wynetta Emery for recommendations. Reviewed the results with the patient and confirmed that she is taking levothyroxine 112 mcg daily as  prescribed. 03-17-2021: The patient states she is doing well. Her thyroid level was normal at recent appointment. The patient states she feels great and denies any new concerns with Thyroid health and well bein

## 2021-07-29 ENCOUNTER — Encounter: Payer: Self-pay | Admitting: Physical Therapy

## 2021-07-29 ENCOUNTER — Ambulatory Visit: Payer: PPO | Attending: Family Medicine | Admitting: Physical Therapy

## 2021-07-29 DIAGNOSIS — M6281 Muscle weakness (generalized): Secondary | ICD-10-CM | POA: Insufficient documentation

## 2021-07-29 DIAGNOSIS — R278 Other lack of coordination: Secondary | ICD-10-CM | POA: Insufficient documentation

## 2021-07-29 NOTE — Therapy (Signed)
?OUTPATIENT PHYSICAL THERAPY TREATMENT NOTE ? ? ?Patient Name: Cassie Brewer ?MRN: [id number] ?DOB:September 03, 1928, 88 y.o., female ?Today's Date: 07/29/2021 ? ?PCP: Valerie Roys, DO ?REFERRING PROVIDER: Valerie Roys, DO ? ? PT End of Session - 07/29/21 1034   ? ? Visit Number 6   ? Number of Visits 8   ? Date for PT Re-Evaluation 08/05/21   ? Authorization Type IE 06/10/2021   ? PT Start Time 1030   ? PT Stop Time 1110   ? PT Time Calculation (min) 40 min   ? Activity Tolerance Patient tolerated treatment well   ? Behavior During Therapy [facility] for tasks assessed/performed   ? ?  ?  ? ?  ? ? ?Past Medical History:  ?Diagnosis Date  ? Glaucoma   ? Hyperlipidemia   ? Hypertension   ? Hypothyroidism 10/02/2014  ? Overactive bladder   ? Squamous cell skin cancer, face   ? ?Past Surgical History:  ?Procedure Laterality Date  ? BREAST SURGERY    ? THYROIDECTOMY, PARTIAL Right   ? TOTAL ABDOMINAL HYSTERECTOMY    ? ?Patient Active Problem List  ? Diagnosis Date Noted  ? Constipation 03/06/2021  ? Aphasia 03/21/2020  ? Aphasia S/P CVA 03/11/2020  ? TIA (transient ischemic attack) 03/10/2020  ? Arthritis of foot 04/24/2015  ? Arthropathy 04/24/2015  ? Benign hypertensive renal disease 10/02/2014  ? Hypothyroidism 10/02/2014  ? Hyperlipidemia 10/02/2014  ? CKD (chronic kidney disease), stage III ([place]) 10/02/2014  ? ? ?REFERRING DIAG: K59.00 (ICD-10-CM) - Constipation, unspecified constipation type  ? ?THERAPY DIAG:  ?Muscle weakness (generalized) ? ?Other lack of coordination ? ? ?SUBJECTIVE: Patient reports continued difficulty with BM consistency. Patient feels exercises have been helpful. Patient notes that she has good days and bad days.  ? ?PAIN:  ?Are you having pain? Yes ?NPRS: 3/10 lumbar, ache ? ?TREATMENT ? ?Pre-treatment assessment: ? ?Manual Therapy: ? ? ?Neuromuscular Re-education: ?Reassessed goals; see below. ? ? ?Therapeutic Exercise: ? ? ?Treatments unbilled: ? ?Post-treatment assessment: ? ?Patient educated  throughout session on appropriate technique and form using multi-modal cueing, HEP, and activity modification. Patient articulated understanding and returned demonstration. ? ?Patient Response to interventions: ?Comfortable to d/c ? ? ? ? PT Long Term Goals  ? ?  ? PT LONG TERM GOAL #1  ? Title Patient will demonstrate independence with HEP in order to maximize therapeutic gains and improve carryover from physical therapy sessions to ADLs in the home and community.   ? Baseline IE: not initiated; 5/3: IND  ? Time 8   ? Period Weeks   ? Status Achieved   ? Target Date 08/05/21   ?  ? PT LONG TERM GOAL #2  ? Title Patient will report decreased reliance on protective undergarments as indicated by < 2/24 hour period consistently over a 2 week timeframe to demonstrate improved bladder control and allow for increased participation in activities outside of the home.   ? Baseline IE: 1-4, type 3/24 hour period; 5/3: 1 pad/ 24 hour period  ? Time 8   ? Period Weeks   ? Status Achieved   ? Target Date 08/05/21   ?  ? PT LONG TERM GOAL #3  ? Title Patient will demonstrate improved function as evidenced by a score of 54 on FOTO measure for full participation in activities at home and in the community.   ? Baseline IE: 46; 5/3: 60  ? Time 8   ? Period Weeks   ? Status  Achieved   ? Target Date 08/05/21   ?  ? PT LONG TERM GOAL #4  ? Title Patient will demonstrate circumferential and sequential contraction of >3/5 MMT, > 5 sec hold x5 and 5 consecutive quick flicks with </= 10 min rest between testing bouts, and relaxation of the PFM coordinated with breath for improved management of intra-abdominal pressure and normal bowel and bladder function without the presence of pain nor incontinence in order to improve participation at home and in the community.   ? Baseline IE: 2/5, 2-3x, 1 sec x1; 5/3: 3-4/5 MMT, x6, 6 sec hold x3  ? Time 8   ? Period Weeks   ? Status Partially Met   ? Target Date 08/05/21   ? ?  ?  ? ?  ? ? Plan   ? ?  Clinical Impression Statement Patient presents to clinic with excellent motivation to participate in therapy. Patient demonstrates minimal remaining deficits in PFM coordination, PFM strength, and bowel and bladder behaviors. Patient indicating goal achievement in all areas during today's session and has responded positively to educational and active interventions throughout her course of care. Patient may benefit from continued skilled therapeutic intervention to address returning deficits in PFM coordination, PFM strength, and bowel and bladder behaviors, but at this time is appropriate to discharge to self-management.  ? Personal Factors and Comorbidities Age;Comorbidity 3+;Time since onset of injury/illness/exacerbation;Behavior Pattern   ? Comorbidities HTN, hyperlipidemia, hypothyroidism, OAB, CKD, hx of CVA   ? Examination-Activity Limitations Toileting   ? Examination-Participation Restrictions Community Activity;Other   ? Stability/Clinical Decision Making Evolving/Moderate complexity   ? Rehab Potential Fair   ? PT Frequency 1x / week   ? PT Duration 8 weeks   ? PT Treatment/Interventions ADLs/Self Care Home Management;Cryotherapy;Moist Heat;Electrical Stimulation;Therapeutic exercise;Neuromuscular re-education;Patient/family education;Orthotic Fit/Training;Manual techniques;Taping   ? PT Home Exercise Plan toileting posture; handouts provided on techniques for improved evacuation of bowels   ? Consulted and Agree with Plan of Care Patient   ? ?  ?  ? ?  ?  ?Myles Gip PT, DPT [id number]  ?07/29/2021, 10:35 AM ? ?  ? ?

## 2021-08-26 DIAGNOSIS — I10 Essential (primary) hypertension: Secondary | ICD-10-CM

## 2021-08-26 DIAGNOSIS — E785 Hyperlipidemia, unspecified: Secondary | ICD-10-CM | POA: Diagnosis not present

## 2021-08-26 DIAGNOSIS — E039 Hypothyroidism, unspecified: Secondary | ICD-10-CM

## 2021-09-07 ENCOUNTER — Encounter: Payer: Self-pay | Admitting: Family Medicine

## 2021-09-07 ENCOUNTER — Ambulatory Visit (INDEPENDENT_AMBULATORY_CARE_PROVIDER_SITE_OTHER): Payer: PPO | Admitting: Family Medicine

## 2021-09-07 VITALS — BP 136/79 | HR 62 | Temp 97.9°F | Wt 150.2 lb

## 2021-09-07 DIAGNOSIS — E039 Hypothyroidism, unspecified: Secondary | ICD-10-CM

## 2021-09-07 DIAGNOSIS — E785 Hyperlipidemia, unspecified: Secondary | ICD-10-CM | POA: Diagnosis not present

## 2021-09-07 DIAGNOSIS — I129 Hypertensive chronic kidney disease with stage 1 through stage 4 chronic kidney disease, or unspecified chronic kidney disease: Secondary | ICD-10-CM

## 2021-09-07 MED ORDER — HYDROCORTISONE (PERIANAL) 2.5 % EX CREA: 1.0000 "application " | TOPICAL_CREAM | Freq: Two times a day (BID) | CUTANEOUS | 12 refills | Status: DC

## 2021-09-07 MED ORDER — BENAZEPRIL HCL 40 MG PO TABS: 40.0000 mg | ORAL_TABLET | Freq: Every day | ORAL | 1 refills | Status: DC

## 2021-09-07 MED ORDER — ATORVASTATIN CALCIUM 20 MG PO TABS: 20.0000 mg | ORAL_TABLET | Freq: Every day | ORAL | 1 refills | Status: DC

## 2021-09-07 MED ORDER — HYDROCHLOROTHIAZIDE 25 MG PO TABS: 25.0000 mg | ORAL_TABLET | Freq: Every day | ORAL | 1 refills | Status: DC

## 2021-09-07 NOTE — Assessment & Plan Note (Signed)
Under good control on current regimen. Continue current regimen. Continue to monitor. Call with any concerns. Refills given. Labs drawn today.   

## 2021-09-07 NOTE — Progress Notes (Signed)
BP 136/79   Pulse 62   Temp 97.9 F (36.6 C)   Wt 150 lb 3.2 oz (68.1 kg)   LMP 05/13/1980   SpO2 98%   BMI 25.77 kg/m    Subjective:    Patient ID: Cassie Brewer, female    DOB: 02-09-1929, 88 y.o.   MRN: [id number]  HPI: Cassie Brewer is a 88 y.o. female  Chief Complaint  Patient presents with   Hypertension   Hypothyroidism   Hyperlipidemia   HYPERTENSION / [place] Satisfied with current treatment? yes Duration of hypertension: chronic BP monitoring frequency: not checking BP medication side effects: no Past BP meds: benazepril, HCTZ Duration of hyperlipidemia: chronic Cholesterol medication side effects: no Cholesterol supplements: fish oil Past cholesterol medications: atorvastatin Medication compliance: excellent compliance Aspirin: yes Recent stressors: no Recurrent headaches: no Visual changes: no Palpitations: no Dyspnea: no Chest pain: no Lower extremity edema: no Dizzy/lightheaded: no  HYPOTHYROIDISM Thyroid control status:controlled Satisfied with current treatment? yes Medication side effects: yes Medication compliance: excellent compliance Etiology of hypothyroidism:  Recent dose adjustment:no Fatigue: yes Cold intolerance: no Heat intolerance: no Weight gain: no Weight loss: no Constipation: no Diarrhea/loose stools: no Palpitations: no Lower extremity edema: no Anxiety/depressed mood: no  Relevant past medical, surgical, family and social history reviewed and updated as indicated. Interim medical history since our last visit reviewed. Allergies and medications reviewed and updated.  Review of Systems  Constitutional: Negative.   Respiratory: Negative.    Cardiovascular: Negative.   Gastrointestinal:  Positive for constipation. Negative for abdominal distention, abdominal pain, anal bleeding, blood in stool, diarrhea, nausea, rectal pain and vomiting.  Musculoskeletal: Negative.   Skin: Negative.    Psychiatric/Behavioral: Negative.      Per HPI unless specifically indicated above     Objective:    BP 136/79   Pulse 62   Temp 97.9 F (36.6 C)   Wt 150 lb 3.2 oz (68.1 kg)   LMP 05/13/1980   SpO2 98%   BMI 25.77 kg/m   Wt Readings from Last 3 Encounters:  09/07/21 150 lb 3.2 oz (68.1 kg)  06/04/21 147 lb 9.6 oz (67 kg)  03/06/21 144 lb 12.8 oz (65.7 kg)    Physical Exam Vitals and nursing note reviewed.  Constitutional:      General: She is not in acute distress.    Appearance: Normal appearance. She is normal weight. She is not ill-appearing, toxic-appearing or diaphoretic.  HENT:     Head: Normocephalic and atraumatic.     Right Ear: External ear normal.     Left Ear: External ear normal.     Nose: Nose normal.     Mouth/Throat:     Mouth: Mucous membranes are moist.     Pharynx: Oropharynx is clear.  Eyes:     General: No scleral icterus.       Right eye: No discharge.        Left eye: No discharge.     Extraocular Movements: Extraocular movements intact.     Conjunctiva/sclera: Conjunctivae normal.     Pupils: Pupils are equal, round, and reactive to light.  Cardiovascular:     Rate and Rhythm: Normal rate and regular rhythm.     Pulses: Normal pulses.     Heart sounds: Normal heart sounds. No murmur heard.    No friction rub. No gallop.  Pulmonary:     Effort: Pulmonary effort is normal. No respiratory distress.     Breath sounds: Normal breath  sounds. No stridor. No wheezing, rhonchi or rales.  Chest:     Chest wall: No tenderness.  Musculoskeletal:        General: Normal range of motion.     Cervical back: Normal range of motion and neck supple.  Skin:    General: Skin is warm and dry.     Capillary Refill: Capillary refill takes less than 2 seconds.     Coloration: Skin is not jaundiced or pale.     Findings: No bruising, erythema, lesion or rash.  Neurological:     General: No focal deficit present.     Mental Status: She is alert and  oriented to person, place, and time. Mental status is at baseline.  Psychiatric:        Mood and Affect: Mood normal.        Behavior: Behavior normal.        Thought Content: Thought content normal.        Judgment: Judgment normal.     Results for orders placed or performed in visit on 03/06/21  Microscopic Examination   Urine  Result Value Ref Range   WBC, UA 0-5 0 - 5 /hpf   RBC 0-2 0 - 2 /hpf   Epithelial Cells (non renal) None seen 0 - 10 /hpf   Bacteria, UA Few (A) None seen/Few  Comprehensive metabolic panel  Result Value Ref Range   Glucose 103 (H) 70 - 99 mg/dL   BUN 23 10 - 36 mg/dL   Creatinine, Ser 0.93 0.57 - 1.00 mg/dL   eGFR 58 (L) >59 mL/min/1.73   BUN/Creatinine Ratio 25 12 - 28   Sodium 140 134 - 144 mmol/L   Potassium 4.0 3.5 - 5.2 mmol/L   Chloride 101 96 - 106 mmol/L   CO2 25 20 - 29 mmol/L   Calcium 9.6 8.7 - 10.3 mg/dL   Total Protein 6.9 6.0 - 8.5 g/dL   Albumin 4.5 3.5 - 4.6 g/dL   Globulin, Total 2.4 1.5 - 4.5 g/dL   Albumin/Globulin Ratio 1.9 1.2 - 2.2   Bilirubin Total 0.5 0.0 - 1.2 mg/dL   Alkaline Phosphatase 63 44 - 121 IU/L   AST 18 0 - 40 IU/L   ALT 14 0 - 32 IU/L  CBC with Differential/Platelet  Result Value Ref Range   WBC 5.2 3.4 - 10.8 x10E3/uL   RBC 4.00 3.77 - 5.28 x10E6/uL   Hemoglobin 11.8 11.1 - 15.9 g/dL   Hematocrit 34.8 34.0 - 46.6 %   MCV 87 79 - 97 fL   MCH 29.5 26.6 - 33.0 pg   MCHC 33.9 31.5 - 35.7 g/dL   RDW 12.8 11.7 - 15.4 %   Platelets 320 150 - 450 x10E3/uL   Neutrophils 62 Not Estab. %   Lymphs 23 Not Estab. %   Monocytes 10 Not Estab. %   Eos 4 Not Estab. %   Basos 1 Not Estab. %   Neutrophils Absolute 3.3 1.4 - 7.0 x10E3/uL   Lymphocytes Absolute 1.2 0.7 - 3.1 x10E3/uL   Monocytes Absolute 0.5 0.1 - 0.9 x10E3/uL   EOS (ABSOLUTE) 0.2 0.0 - 0.4 x10E3/uL   Basophils Absolute 0.1 0.0 - 0.2 x10E3/uL   Immature Granulocytes 0 Not Estab. %   Immature Grans (Abs) 0.0 0.0 - 0.1 x10E3/uL  Lipid Panel w/o  Chol/HDL Ratio  Result Value Ref Range   Cholesterol, Total 171 100 - 199 mg/dL   Triglycerides 60 0 - 149 mg/dL  HDL 77 >39 mg/dL   VLDL Cholesterol Cal 12 5 - 40 mg/dL   LDL Chol Calc (NIH) 82 0 - 99 mg/dL  Microalbumin, Urine Waived  Result Value Ref Range   Microalb, Ur Waived 10 0 - 19 mg/L   Creatinine, Urine Waived 50 10 - 300 mg/dL   Microalb/Creat Ratio <30 <30 mg/g  TSH  Result Value Ref Range   TSH 0.909 0.450 - 4.500 uIU/mL  Urinalysis, Routine w reflex microscopic  Result Value Ref Range   Specific Gravity, UA 1.015 1.005 - 1.030   pH, UA 7.0 5.0 - 7.5   Color, UA Yellow Yellow   Appearance Ur Clear Clear   Leukocytes,UA 2+ (A) Negative   Protein,UA Negative Negative/Trace   Glucose, UA Negative Negative   Ketones, UA Negative Negative   RBC, UA Negative Negative   Bilirubin, UA Negative Negative   Urobilinogen, Ur 0.2 0.2 - 1.0 mg/dL   Nitrite, UA Negative Negative   Microscopic Examination See below:       Assessment & Plan:   Problem List Items Addressed This Visit       Endocrine   Hypothyroidism - Primary    Rechecking labs today. Await results. Treat as needed.       Relevant Orders   Comprehensive metabolic panel   CBC with Differential/Platelet   TSH     Genitourinary   Benign hypertensive renal disease    Under good control on current regimen. Continue current regimen. Continue to monitor. Call with any concerns. Refills given. Labs drawn today.        Relevant Medications   benazepril (LOTENSIN) 40 MG tablet   hydrochlorothiazide (HYDRODIURIL) 25 MG tablet   Other Relevant Orders   Comprehensive metabolic panel   CBC with Differential/Platelet     Other   Hyperlipidemia    Under good control on current regimen. Continue current regimen. Continue to monitor. Call with any concerns. Refills given. Labs drawn today.        Relevant Medications   atorvastatin (LIPITOR) 20 MG tablet   benazepril (LOTENSIN) 40 MG tablet    hydrochlorothiazide (HYDRODIURIL) 25 MG tablet   Other Relevant Orders   Comprehensive metabolic panel   CBC with Differential/Platelet   Lipid Panel w/o Chol/HDL Ratio     Follow up plan: Return in about 3 months (around 12/08/2021).

## 2021-09-07 NOTE — Assessment & Plan Note (Signed)
Rechecking labs today. Await results. Treat as needed.  °

## 2021-09-08 ENCOUNTER — Encounter: Payer: Self-pay | Admitting: Family Medicine

## 2021-09-08 LAB — CBC WITH DIFFERENTIAL/PLATELET
Basophils Absolute: 0.1 10*3/uL (ref 0.0–0.2)
Basos: 1 %
EOS (ABSOLUTE): 0.3 10*3/uL (ref 0.0–0.4)
Eos: 6 %
Hematocrit: 34.4 % (ref 34.0–46.6)
Hemoglobin: 11.9 g/dL (ref 11.1–15.9)
Immature Grans (Abs): 0 10*3/uL (ref 0.0–0.1)
Immature Granulocytes: 1 %
Lymphocytes Absolute: 1.3 10*3/uL (ref 0.7–3.1)
Lymphs: 24 %
MCH: 30.4 pg (ref 26.6–33.0)
MCHC: 34.6 g/dL (ref 31.5–35.7)
MCV: 88 fL (ref 79–97)
Monocytes Absolute: 0.6 10*3/uL (ref 0.1–0.9)
Monocytes: 11 %
Neutrophils Absolute: 3 10*3/uL (ref 1.4–7.0)
Neutrophils: 57 %
Platelets: 305 10*3/uL (ref 150–450)
RBC: 3.92 x10E6/uL (ref 3.77–5.28)
RDW: 13.1 % (ref 11.7–15.4)
WBC: 5.3 10*3/uL (ref 3.4–10.8)

## 2021-09-08 LAB — LIPID PANEL W/O CHOL/HDL RATIO
Cholesterol, Total: 154 mg/dL (ref 100–199)
HDL: 63 mg/dL (ref >39)
LDL Chol Calc (NIH): 81 mg/dL (ref 0–99)
Triglycerides: 46 mg/dL (ref 0–149)
VLDL Cholesterol Cal: 10 mg/dL (ref 5–40)

## 2021-09-08 LAB — COMPREHENSIVE METABOLIC PANEL
ALT: 14 IU/L (ref 0–32)
AST: 18 IU/L (ref 0–40)
Albumin/Globulin Ratio: 1.6 (ref 1.2–2.2)
Albumin: 4.1 g/dL (ref 3.5–4.6)
Alkaline Phosphatase: 63 IU/L (ref 44–121)
BUN/Creatinine Ratio: 19 (ref 12–28)
BUN: 19 mg/dL (ref 10–36)
Bilirubin Total: 0.4 mg/dL (ref 0.0–1.2)
CO2: 23 mmol/L (ref 20–29)
Calcium: 9.4 mg/dL (ref 8.7–10.3)
Chloride: 100 mmol/L (ref 96–106)
Creatinine, Ser: 1.01 mg/dL — ABNORMAL HIGH (ref 0.57–1.00)
Globulin, Total: 2.6 g/dL (ref 1.5–4.5)
Glucose: 107 mg/dL — ABNORMAL HIGH (ref 70–99)
Potassium: 4.1 mmol/L (ref 3.5–5.2)
Sodium: 138 mmol/L (ref 134–144)
Total Protein: 6.7 g/dL (ref 6.0–8.5)
eGFR: 53 mL/min/{1.73_m2} — ABNORMAL LOW (ref >59)

## 2021-09-08 LAB — TSH: TSH: 1.57 u[IU]/mL (ref 0.450–4.500)

## 2021-09-17 ENCOUNTER — Ambulatory Visit: Payer: PPO | Admitting: Podiatry

## 2021-09-22 ENCOUNTER — Ambulatory Visit: Payer: PPO | Admitting: Podiatry

## 2021-09-22 DIAGNOSIS — B351 Tinea unguium: Secondary | ICD-10-CM

## 2021-09-22 DIAGNOSIS — M79674 Pain in right toe(s): Secondary | ICD-10-CM

## 2021-09-22 DIAGNOSIS — M79675 Pain in left toe(s): Secondary | ICD-10-CM

## 2021-09-30 NOTE — Progress Notes (Signed)
  Subjective:  Patient ID: Cassie Brewer, female    DOB: 30-Jun-1928,  MRN: [id number]  Chief Complaint  Patient presents with   Nail Problem    Nail trim    88 y.o. female returns for the above complaint.  Patient presents with thickened elongated mycotic toenails x10.  Patient is especially concerned for right hallux onychomycosis.  Patient not able to debride on herself.  She would like for [place] to debride down.  She does not have any secondary complaints. Objective:   There were no vitals filed for this visit. Podiatric Exam: Vascular: dorsalis pedis and posterior tibial pulses are palpable bilateral. Capillary return is immediate. Temperature gradient is WNL. Skin turgor WNL  Sensorium: Normal Semmes Weinstein monofilament test. Normal tactile sensation bilaterally.  However she experiences subjective neuropathic pain to the distal tip of the toes.  Intact sensation Nail Exam: Pt has thick disfigured discolored nails with subungual debris noted bilateral entire nail hallux through fifth toenails.  Ingrown noted to the right hallux lateral border.  Mild pain on palpation. Ulcer Exam: There is no evidence of ulcer or pre-ulcerative changes or infection. Orthopedic Exam: Muscle tone and strength are WNL. No limitations in general ROM. No crepitus or effusions noted. HAV  B/L.  Hammer toes 2-5  B/L. Skin: No Porokeratosis. No infection or ulcers.  Xerosis/psoriasis of the plantar foot with now subjective component of itching..  There is mild cracking noted.  No bleeding noted.  Assessment & Plan:  Patient was evaluated and treated and all questions answered.  Athlete's foot bilateral -Explained to patient the etiology of athlete's foot and various treatment options were discussed.  Given that there is subjective complaint of itching associated with the bottom of the foot I believe she will benefit from Lotrisone cream.  I have asked her to apply twice a day.  She states  understanding.  Neuropathic pain -I explained to the patient the etiology of neuropathic pain and various treatment options were extensively discussed.  I believe she will benefit from neuropathic cream to help decrease the pain associated with this -Prescription for neuropathic cream was sent to [facility] drug pharmacy.  Xerosis/psoriasis -I explained to the patient the etiology of dryness with cracking as well as various treatment options associated with it.  Given that she is doing good with clotrimazole cream that was given to her by dermatologist and had diagnosed her with psoriasis, I believe she can continue using the cream.  Patient agrees with this plan.  Onychomycosis with pain  -Nails palliatively debrided as below. -Educated on self-care  Procedure: Nail Debridement Rationale: pain  Type of Debridement: manual, sharp debridement. Instrumentation: Nail nipper, rotary burr. Number of Nails: 10  Procedures and Treatment: Consent by patient was obtained for treatment procedures. The patient understood the discussion of treatment and procedures well. All questions were answered thoroughly reviewed. Debridement of mycotic and hypertrophic toenails, 1 through 5 bilateral and clearing of subungual debris. No ulceration, no infection noted.  Return Visit-Office Procedure: Patient instructed to return to the office for a follow up visit 3 months for continued evaluation and treatment.  Boneta Lucks, DPM    No follow-ups on file.

## 2021-10-06 ENCOUNTER — Telehealth: Payer: PPO

## 2021-11-06 ENCOUNTER — Telehealth: Payer: PPO

## 2021-11-06 ENCOUNTER — Ambulatory Visit (INDEPENDENT_AMBULATORY_CARE_PROVIDER_SITE_OTHER): Payer: PPO

## 2021-11-06 DIAGNOSIS — I129 Hypertensive chronic kidney disease with stage 1 through stage 4 chronic kidney disease, or unspecified chronic kidney disease: Secondary | ICD-10-CM

## 2021-11-06 DIAGNOSIS — R32 Unspecified urinary incontinence: Secondary | ICD-10-CM

## 2021-11-06 DIAGNOSIS — E039 Hypothyroidism, unspecified: Secondary | ICD-10-CM

## 2021-11-06 DIAGNOSIS — I10 Essential (primary) hypertension: Secondary | ICD-10-CM

## 2021-11-06 DIAGNOSIS — K5900 Constipation, unspecified: Secondary | ICD-10-CM

## 2021-11-06 DIAGNOSIS — E785 Hyperlipidemia, unspecified: Secondary | ICD-10-CM

## 2021-11-06 NOTE — Patient Instructions (Signed)
Visit Information  Thank you for taking time to visit with me today. Please don't hesitate to contact me if I can be of assistance to you before our next scheduled telephone appointment.  Following are the goals we discussed today:  Current Barriers: 11-06-2021: The patient is doing well. She has no new needs at this time. Her chronic conditions are stable and she knows to call the [facility] for new concerns or needs. Review of RNCM number to call for needs.  Knowledge Deficits related to plan of care for management of HTN, HLD, and Hypothyroidism and Constipation  Chronic Disease Management support and education needs related to HTN, HLD, and Hypothyroidism and Constipation   RNCM Clinical Goal(s):  Patient will verbalize understanding of plan for management of HTN, HLD, Hypothyroidism, and Constipation   verbalize basic understanding of HTN, HLD, Hypothyroidism, and Constipation  disease process and self health management plan  take all medications exactly as prescribed and will call provider for medication related questions demonstrate understanding of rationale for each prescribed medication  attend all scheduled medical appointments: 09-07-2021 at 0900 am demonstrate improved and ongoing adherence to prescribed treatment plan for HTN, HLD, Hypothyroidism, and Constipation as evidenced by adherence to prescribed medication regimen contacting provider for new or worsened symptoms or questions  demonstrate a decrease in HTN, HLD, Hypothyroidism, and constipation  exacerbations  demonstrate ongoing self health care management ability to effectively manage chronic conditions  through collaboration with RN Care manager, provider, and care team.    Interventions: 1:1 collaboration with primary care provider regarding development and update of comprehensive plan of care as evidenced by provider attestation and co-signature Inter-disciplinary care team collaboration (see longitudinal plan of  care) Evaluation of current treatment plan related to  self management and patient's adherence to plan as established by provider     SDOH Barriers (Status: Goal Met.) Long Term Goal (>30 days) Patient interviewed and SDOH assessment performed        SDOH Interventions     Flowsheet Row Most Recent Value  SDOH Interventions    Food Insecurity Interventions Intervention Not Indicated  Financial Strain Interventions Intervention Not Indicated  Housing Interventions Intervention Not Indicated  Intimate Partner Violence Interventions Intervention Not Indicated  Physical Activity Interventions Intervention Not Indicated, Other (Comments)  [active in her yard]  Stress Interventions Intervention Not Indicated  Social Connections Interventions Intervention Not Indicated  Transportation Interventions Intervention Not Indicated       Patient interviewed and appropriate assessments performed Provided patient with information about changes in SDOH, and resources to help with any needs. Care guides being available to assist with changes  Discussed plans with patient for ongoing care management follow up and provided patient with direct contact information for care management team Advised patient to call the office for changes in SDOH, new concerns or questions.  Saw dermatologist recently and will go back for follow up on some areas of concern the patient has. Will continue to monitor for changes.        Hypothyroidism  (Status: Goal Met.)11-06-2021: Goals met and care plan is being closed  Evaluation of current treatment plan related to  Hypothyroidism,  self-management and patient's adherence to plan as established by provider. 01-23-2021: The patient states that she is feeling more tired than usual. She is a little concerned that her thyroid levels are on the low end of normal and said the the specialist always told her that it should be on the higher end of normal. She would  like to know from Dr.  Wynetta Emery if her levothyroxine dose should be changed. Will collaborate with Dr. Wynetta Emery and send an inbasket message to Dr. Wynetta Emery for recommendations. Reviewed the results with the patient and confirmed that she is taking levothyroxine 112 mcg daily as prescribed. 03-17-2021: The patient states she is doing well. Her thyroid level was normal at recent appointment. The patient states she feels great and denies any new concerns with Thyroid health and well being.  07-28-2021: The patient denies any new issues with her thyroid. Is compliant with the plan of care.  Discussed plans with patient for ongoing care management follow up and provided patient with direct contact information for care management team Advised patient to call the office for changes in condition, questions, or concerns; Provided education to patient re: thyroid level being in normal range.  The patient is concerned it is on the low end of normal and was told before it needed to be on the higher range. ; Reviewed medications with patient and discussed compliance. The patient is taking levothyroxine 112 mcg as prescribed. 07-28-2021: The patient is compliant with medications; Provided patient with tsh educational materials related to hypothyroidism ; Reviewed scheduled/upcoming provider appointments including 06-04-2021 at [place] am, the patient is going in the office today to get her flu vaccine. Next appointment with the pcp is 10-06-2021 at 0945 am; Discussed plans with patient for ongoing care management follow up and provided patient with direct contact information for care management team; Screening for signs and symptoms of depression related to chronic disease state;  Assessed social determinant of health barriers;    Hyperlipidemia:  (Status: Goal Met.) 11-06-2021: Goals met and care plan is being closed       Lab Results  Component Value Date    CHOL 154 09/07/2021    HDL 63 09/07/2021    LDLCALC 81 09/07/2021    TRIG 46 09/07/2021     CHOLHDL 3.3 03/11/2020      Medication review performed; medication list updated in electronic medical record. 07-28-2021: Lipitor 20 mg daily and is compliant.  Provider established cholesterol goals reviewed. 07-28-2021: The patient is doing well and denies any acute findings with HLD and heart health. Will continue to monitor for changes in conditions. ; Counseled on importance of regular laboratory monitoring as prescribed. 07-28-2021: The patient has regular lab work; Provided HLD Scientist, clinical (histocompatibility and immunogenetics); Reviewed role and benefits of statin for ASCVD risk reduction; Discussed strategies to manage statin-induced myalgias; Reviewed importance of limiting foods high in cholesterol. 05-26-2021: the patient is eating well.  Her children visited recently and she states her daughter in law prepared freezer meals for her to have on hand and cook when she wanted them. She denies any issues with compliance with dietary restrictions. 07-28-2021: The patient states that she is eating well. She denies any issues with being compliance with dietary restrictions. Last eye exam on 05-25-2021. Will see her eye provider again in 6 months. Saw podiatrist in June 18, 2021   Hypertension: (Status: Goal Met.)11-06-2021: Goals met and care plan is being closed  Last practice recorded BP readings:     BP Readings from Last 3 Encounters:  06/04/21 134/75  03/06/21 121/75  11/28/20 132/80  Most recent eGFR/CrCl:       Lab Results  Component Value Date    EGFR 58 (L) 03/06/2021    No components found for: CRCL   Evaluation of current treatment plan related to hypertension self management and patient's adherence to  plan as established by provider. 03-17-2021: Blood pressures more stable. The patient saw pcp recently and receive a good report. Denies any acute findings and states she is eating well and feeling good.   07-28-2021: Blood pressures are stable. Denies any acute findings. Is compliant with plan of care.   Provided education to patient re: stroke prevention, s/s of heart attack and stroke; Reviewed prescribed diet Heart Healthy. 07-28-2021: The patient is compliant with a heart healthy diet Reviewed medications with patient and discussed importance of compliance. 07-28-2021: The patient is compliant with a heart healthy diet;  Discussed plans with patient for ongoing care management follow up and provided patient with direct contact information for care management team; Advised patient, providing education and rationale, to monitor blood pressure daily and record, calling PCP for findings outside established parameters;  Reviewed scheduled/upcoming provider appointments including: 09-07-2021 at 0900 am Provided education on prescribed diet Heart healthy. 09-07-2021: Review with the patient the benefits of heart healthy diet. The patient is compliant with dietary measures;  Discussed complications of poorly controlled blood pressure such as heart disease, stroke, circulatory complications, vision complications, kidney impairment, sexual dysfunction;      Bowel concerns/constipation  (Status: Goal Met.) Long Term Goal 11-06-2021: Goals met and care plan is being closed  Evaluation of current treatment plan related to  bowel concerns/constipation ,  self-management and patient's adherence to plan as established by provider. 05-26-2021: The patient states that she heard from the referral made by Dr. Wynetta Emery and the first available appointment for her to go and learn about exercises for bowel health is 06-10-2021. She states she thought they had forgotten her but she has heard from them and she will see what they say. Her bowel regimen is working well at this time and stable. She is taking Miralax 5 days a week. She denies any acute distress. Follow up planned with Dr. Wynetta Emery on 06-04-2021. 07-28-2021: The patient states she continues to work with the PT and she can tell some positive things. She continues to do the  exercises at home.  The patient states that she will have her last visit tomorrow with the PT provider. She feels over all this has made her stronger. Will continue to monitor for changes or new needs.  Discussed plans with patient for ongoing care management follow up and provided patient with direct contact information for care management team Advised patient to call after the holidays the office if she has not heard from the referral for the PT to work with her for pelvic exercises. 05-26-2021: The patient states heard from the referral and her appointment is 06-10-2021 for evaluation and recommendations. 07-28-2021: Has been working with the specialist and says this has helped her; Provided education to patient re: monitoring for changes in bowel habits, taking medications as directed, calling the office for questions or concerns related to bowel health and well being; Reviewed medications with patient and discussed compliance ; Reviewed scheduled/upcoming provider appointments including 09-07-2021 at 0900 am; Discussed plans with patient for ongoing care management follow up and provided patient with direct contact information for care management team; Advised patient to discuss bowel changes and concerns with provider; Screening for signs and symptoms of depression related to chronic disease state;  Assessed social determinant of health barriers;       Urinary Incontinence  (Status: Goal Met.) Long Term Goal 11-06-2021: Goals met and care plan is being closed  Evaluation of current treatment plan related to  urinary incontinence ,  self-management and patient's adherence to plan as established by provider. 07-28-2021: The patient is still dealing with a lot of nocturnal incontinence. Her urinary patterns are more mild during the day. She feels the PT she is doing is strengthening her pelvic wall. She states that she is continuing to do the exercises at home and her last visit with the specialist is  tomorrow. Will continue to monitor for new concerns.  Discussed plans with patient for ongoing care management follow up and provided patient with direct contact information for care management team Advised patient to call the office for changes in urinary incontinence patterns, questions or concerns; Provided education to patient re: good hygiene, working with the specialist, continuing to do the exercises at home; Reviewed scheduled/upcoming provider appointments including 09-07-2021 at 0900 am and with the specialist on 07-29-2021; Discussed plans with patient for ongoing care management follow up and provided patient with direct contact information for care management team; Advised patient to discuss new concerns with urinary health  with provider;    Patient Goals/Self-Care Activities: Patient will self administer medications as prescribed as evidenced by self report/primary caregiver report  Patient will attend all scheduled provider appointments as evidenced by clinician review of documented attendance to scheduled appointments and patient/caregiver report Patient will call pharmacy for medication refills as evidenced by patient report and review of pharmacy fill history as appropriate Patient will attend church or other social activities as evidenced by patient report Patient will continue to perform ADL's independently as evidenced by patient/caregiver report Patient will continue to perform IADL's independently as evidenced by patient/caregiver report Patient will call provider office for new concerns or questions as evidenced by review of documented incoming telephone call notes and patient report Patient will work with BSW to address care coordination needs and will continue to work with the clinical team to address health care and disease management related needs as evidenced by documented adherence to scheduled care management/care coordination appointments - check blood pressure 3 times  per week - choose a place to take my blood pressure (home, clinic or office, retail store) - write blood pressure results in a log or diary - learn about high blood pressure - keep a blood pressure log - take blood pressure log to all doctor appointments - call doctor for signs and symptoms of high blood pressure - develop an action plan for high blood pressure - keep all doctor appointments - take medications for blood pressure exactly as prescribed - report new symptoms to your doctor - eat more whole grains, fruits and vegetables, lean meats and healthy fats - call for medicine refill 2 or 3 days before it runs out - take all medications exactly as prescribed - call doctor with any symptoms you believe are related to your medicine - call doctor when you experience any new symptoms - go to all doctor appointments as scheduled - adhere to prescribed diet: heart Healthy             Please call the care guide team at [phone] if you need to schedule your appointment.   If you are experiencing a Mental Health or [place] or need someone to talk to, please call the Suicide and Crisis Lifeline: 988 call the [place] National Suicide Prevention Lifeline: (985)742-0139 or TTY: 236-755-4175 TTY (850) 684-2172) to talk to a trained counselor call 1-800-273-TALK (toll free, 24 hour hotline)   Patient verbalizes understanding of instructions and care plan provided today and agrees to view in [place].  Active MyChart status and patient understanding of how to access instructions and care plan via MyChart confirmed with patient.     Noreene Larsson RN, MSN, [facility] Mobile: [phone]

## 2021-11-06 NOTE — Chronic Care Management (AMB) (Signed)
Chronic Care Management   CCM RN Visit Note  11/06/2021 Name: Cassie Brewer MRN: [id number] DOB: [birth year]  Subjective: Cassie Brewer is a 88 y.o. year old female who is a primary care patient of Valerie Roys, DO. The care management team was consulted for assistance with disease management and care coordination needs.    Engaged with patient by telephone for follow up visit in response to provider referral for case management and/or care coordination services.   Consent to Services:  The patient was given information about Chronic Care Management services, agreed to services, and gave verbal consent prior to initiation of services.  Please see initial visit note for detailed documentation.   Patient agreed to services and verbal consent obtained.   Assessment: Review of patient past medical history, allergies, medications, health status, including review of consultants reports, laboratory and other test data, was performed as part of comprehensive evaluation and provision of chronic care management services.   SDOH (Social Determinants of Health) assessments and interventions performed:  SDOH Interventions    Flowsheet Row Most Recent Value  SDOH Interventions   Food Insecurity Interventions Intervention Not Indicated  Housing Interventions Intervention Not Indicated  Transportation Interventions Intervention Not Indicated        CCM Care Plan  Allergies  Allergen Reactions   Amoxicillin     Other reaction(s): Other (See Comments) "blood pressure very high - almost passed out"   Eryc [Erythromycin] Other (See Comments)   Sulfa Antibiotics Nausea And Vomiting    Nausea with bactrim in 2017  Nausea with bactrim in 2017     Outpatient Encounter Medications as of 11/06/2021  Medication Sig   aspirin 81 MG EC tablet Take 1 tablet (81 mg total) by mouth daily. Swallow whole.   atorvastatin (LIPITOR) 20 MG tablet Take 1 tablet (20 mg total) by mouth daily.   benazepril  (LOTENSIN) 40 MG tablet Take 1 tablet (40 mg total) by mouth daily.   Calcium Carbonate-Vit D-Min (CALCIUM 600+D PLUS MINERALS) 600-400 MG-UNIT TABS Take 1 tablet by mouth as directed.   clotrimazole-betamethasone (LOTRISONE) cream Apply 1 application topically 2 (two) times daily.   Desoximetasone (TOPICORT) 0.25 % ointment SMARTSIG:sparingly Topical Twice Daily   fluticasone (FLONASE) 50 MCG/ACT nasal spray Place 2 sprays into both nostrils daily as needed for allergies.   hydrochlorothiazide (HYDRODIURIL) 25 MG tablet Take 1 tablet (25 mg total) by mouth daily.   hydrocortisone (ANUSOL-HC) 2.5 % rectal cream Place 1 application  rectally 2 (two) times daily.   latanoprost (XALATAN) 0.005 % ophthalmic solution Place 1 drop into both eyes at bedtime.   levothyroxine (SYNTHROID) 112 MCG tablet Take 1 tablet (112 mcg total) by mouth daily before breakfast.   Multiple Vitamins-Minerals (CENTRUM SILVER ULTRA WOMENS PO) Take 1 tablet by mouth daily.   Omega-3 Fatty Acids (FISH OIL) 1000 MG CAPS Take 1,000 mg by mouth daily.   polyethylene glycol powder (GLYCOLAX/MIRALAX) 17 GM/SCOOP powder Take 17 g by mouth 2 (two) times daily as needed.   timolol (TIMOPTIC) 0.5 % ophthalmic solution Place 1 drop into the left eye daily.   vitamin C (ASCORBIC ACID) 500 MG tablet Take 500 mg by mouth daily.   No facility-administered encounter medications on file as of 11/06/2021.    Patient Active Problem List   Diagnosis Date Noted   Constipation 03/06/2021   Aphasia 03/21/2020   Aphasia S/P CVA 03/11/2020   TIA (transient ischemic attack) 03/10/2020   Arthritis of foot 04/24/2015  Arthropathy 04/24/2015   Benign hypertensive renal disease 10/02/2014   Hypothyroidism 10/02/2014   Hyperlipidemia 10/02/2014   CKD (chronic kidney disease), stage III ([place]) 10/02/2014    Conditions to be addressed/monitored:HTN, HLD, Hypothyroidism, and constipation and urinary incontinence   Care Plan : RNCM: General Plan  of Care (Adult) for Chronic Disease Manage and Care Coordination Needs  Updates made by Vanita Ingles, RN since 11/06/2021 12:00 AM  Completed 11/06/2021   Problem: RNCM: Development of Plan of Care for Chronic Disease Management (HTN, HLD, Constipation, Hypothyroidism) Resolved 11/06/2021  Priority: High     Long-Range Goal: RNCM: Effective Management of Plan of Care for Chronic Disease Management (HTN, HLD, Constipation, Hypothyroidism) Completed 11/06/2021  Start Date: 01/23/2021  Expected End Date: 01/23/2022  Priority: High  Note:   Current Barriers: 11-06-2021: The patient is doing well. She has no new needs at this time. Her chronic conditions are stable and she knows to call the [facility] for new concerns or needs. Review of RNCM number to call for needs.  Knowledge Deficits related to plan of care for management of HTN, HLD, and Hypothyroidism and Constipation  Chronic Disease Management support and education needs related to HTN, HLD, and Hypothyroidism and Constipation  RNCM Clinical Goal(s):  Patient will verbalize understanding of plan for management of HTN, HLD, Hypothyroidism, and Constipation   verbalize basic understanding of HTN, HLD, Hypothyroidism, and Constipation  disease process and self health management plan  take all medications exactly as prescribed and will call provider for medication related questions demonstrate understanding of rationale for each prescribed medication  attend all scheduled medical appointments: 09-07-2021 at 0900 am demonstrate improved and ongoing adherence to prescribed treatment plan for HTN, HLD, Hypothyroidism, and Constipation as evidenced by adherence to prescribed medication regimen contacting provider for new or worsened symptoms or questions  demonstrate a decrease in HTN, HLD, Hypothyroidism, and constipation  exacerbations  demonstrate ongoing self health care management ability to effectively manage chronic conditions  through collaboration  with RN Care manager, provider, and care team.   Interventions: 1:1 collaboration with primary care provider regarding development and update of comprehensive plan of care as evidenced by provider attestation and co-signature Inter-disciplinary care team collaboration (see longitudinal plan of care) Evaluation of current treatment plan related to  self management and patient's adherence to plan as established by provider   SDOH Barriers (Status: Goal Met.) Long Term Goal (>30 days) Patient interviewed and SDOH assessment performed        SDOH Interventions    Flowsheet Row Most Recent Value  SDOH Interventions   Food Insecurity Interventions Intervention Not Indicated  Financial Strain Interventions Intervention Not Indicated  Housing Interventions Intervention Not Indicated  Intimate Partner Violence Interventions Intervention Not Indicated  Physical Activity Interventions Intervention Not Indicated, Other (Comments)  [active in her yard]  Stress Interventions Intervention Not Indicated  Social Connections Interventions Intervention Not Indicated  Transportation Interventions Intervention Not Indicated     Patient interviewed and appropriate assessments performed Provided patient with information about changes in SDOH, and resources to help with any needs. Care guides being available to assist with changes  Discussed plans with patient for ongoing care management follow up and provided patient with direct contact information for care management team Advised patient to call the office for changes in SDOH, new concerns or questions.  Saw dermatologist recently and will go back for follow up on some areas of concern the patient has. Will continue to monitor for changes.  Hypothyroidism  (Status: Goal Met.)11-06-2021: Goals met and care plan is being closed  Evaluation of current treatment plan related to  Hypothyroidism,  self-management and patient's adherence to plan as  established by provider. 01-23-2021: The patient states that she is feeling more tired than usual. She is a little concerned that her thyroid levels are on the low end of normal and said the the specialist always told her that it should be on the higher end of normal. She would like to know from Dr. Wynetta Emery if her levothyroxine dose should be changed. Will collaborate with Dr. Wynetta Emery and send an inbasket message to Dr. Wynetta Emery for recommendations. Reviewed the results with the patient and confirmed that she is taking levothyroxine 112 mcg daily as prescribed. 03-17-2021: The patient states she is doing well. Her thyroid level was normal at recent appointment. The patient states she feels great and denies any new concerns with Thyroid health and well being.  07-28-2021: The patient denies any new issues with her thyroid. Is compliant with the plan of care.  Discussed plans with patient for ongoing care management follow up and provided patient with direct contact information for care management team Advised patient to call the office for changes in condition, questions, or concerns; Provided education to patient re: thyroid level being in normal range.  The patient is concerned it is on the low end of normal and was told before it needed to be on the higher range. ; Reviewed medications with patient and discussed compliance. The patient is taking levothyroxine 112 mcg as prescribed. 07-28-2021: The patient is compliant with medications; Provided patient with tsh educational materials related to hypothyroidism ; Reviewed scheduled/upcoming provider appointments including 06-04-2021 at [place] am, the patient is going in the office today to get her flu vaccine. Next appointment with the pcp is 10-06-2021 at 0945 am; Discussed plans with patient for ongoing care management follow up and provided patient with direct contact information for care management team; Screening for signs and symptoms of depression related to  chronic disease state;  Assessed social determinant of health barriers;   Hyperlipidemia:  (Status: Goal Met.) 11-06-2021: Goals met and care plan is being closed  Lab Results  Component Value Date   CHOL 154 09/07/2021   HDL 63 09/07/2021   LDLCALC 81 09/07/2021   TRIG 46 09/07/2021   CHOLHDL 3.3 03/11/2020     Medication review performed; medication list updated in electronic medical record. 07-28-2021: Lipitor 20 mg daily and is compliant.  Provider established cholesterol goals reviewed. 07-28-2021: The patient is doing well and denies any acute findings with HLD and heart health. Will continue to monitor for changes in conditions. ; Counseled on importance of regular laboratory monitoring as prescribed. 07-28-2021: The patient has regular lab work; Provided HLD Scientist, clinical (histocompatibility and immunogenetics); Reviewed role and benefits of statin for ASCVD risk reduction; Discussed strategies to manage statin-induced myalgias; Reviewed importance of limiting foods high in cholesterol. 05-26-2021: the patient is eating well.  Her children visited recently and she states her daughter in law prepared freezer meals for her to have on hand and cook when she wanted them. She denies any issues with compliance with dietary restrictions. 07-28-2021: The patient states that she is eating well. She denies any issues with being compliance with dietary restrictions. Last eye exam on 05-25-2021. Will see her eye provider again in 6 months. Saw podiatrist in June 18, 2021  Hypertension: (Status: Goal Met.)11-06-2021: Goals met and care plan is being closed  Last practice  recorded BP readings:  BP Readings from Last 3 Encounters:  06/04/21 134/75  03/06/21 121/75  11/28/20 132/80  Most recent eGFR/CrCl:  Lab Results  Component Value Date   EGFR 58 (L) 03/06/2021    No components found for: CRCL  Evaluation of current treatment plan related to hypertension self management and patient's adherence to plan as established by provider.  03-17-2021: Blood pressures more stable. The patient saw pcp recently and receive a good report. Denies any acute findings and states she is eating well and feeling good.   07-28-2021: Blood pressures are stable. Denies any acute findings. Is compliant with plan of care.  Provided education to patient re: stroke prevention, s/s of heart attack and stroke; Reviewed prescribed diet Heart Healthy. 07-28-2021: The patient is compliant with a heart healthy diet Reviewed medications with patient and discussed importance of compliance. 07-28-2021: The patient is compliant with a heart healthy diet;  Discussed plans with patient for ongoing care management follow up and provided patient with direct contact information for care management team; Advised patient, providing education and rationale, to monitor blood pressure daily and record, calling PCP for findings outside established parameters;  Reviewed scheduled/upcoming provider appointments including: 09-07-2021 at 0900 am Provided education on prescribed diet Heart healthy. 09-07-2021: Review with the patient the benefits of heart healthy diet. The patient is compliant with dietary measures;  Discussed complications of poorly controlled blood pressure such as heart disease, stroke, circulatory complications, vision complications, kidney impairment, sexual dysfunction;    Bowel concerns/constipation  (Status: Goal Met.) Long Term Goal 11-06-2021: Goals met and care plan is being closed  Evaluation of current treatment plan related to  bowel concerns/constipation ,  self-management and patient's adherence to plan as established by provider. 05-26-2021: The patient states that she heard from the referral made by Dr. Wynetta Emery and the first available appointment for her to go and learn about exercises for bowel health is 06-10-2021. She states she thought they had forgotten her but she has heard from them and she will see what they say. Her bowel regimen is working well at  this time and stable. She is taking Miralax 5 days a week. She denies any acute distress. Follow up planned with Dr. Wynetta Emery on 06-04-2021. 07-28-2021: The patient states she continues to work with the PT and she can tell some positive things. She continues to do the exercises at home.  The patient states that she will have her last visit tomorrow with the PT provider. She feels over all this has made her stronger. Will continue to monitor for changes or new needs.  Discussed plans with patient for ongoing care management follow up and provided patient with direct contact information for care management team Advised patient to call after the holidays the office if she has not heard from the referral for the PT to work with her for pelvic exercises. 05-26-2021: The patient states heard from the referral and her appointment is 06-10-2021 for evaluation and recommendations. 07-28-2021: Has been working with the specialist and says this has helped her; Provided education to patient re: monitoring for changes in bowel habits, taking medications as directed, calling the office for questions or concerns related to bowel health and well being; Reviewed medications with patient and discussed compliance ; Reviewed scheduled/upcoming provider appointments including 09-07-2021 at 0900 am; Discussed plans with patient for ongoing care management follow up and provided patient with direct contact information for care management team; Advised patient to discuss bowel changes and concerns  with provider; Screening for signs and symptoms of depression related to chronic disease state;  Assessed social determinant of health barriers;     Urinary Incontinence  (Status: Goal Met.) Long Term Goal 11-06-2021: Goals met and care plan is being closed  Evaluation of current treatment plan related to  urinary incontinence ,  self-management and patient's adherence to plan as established by provider. 07-28-2021: The patient is still dealing  with a lot of nocturnal incontinence. Her urinary patterns are more mild during the day. She feels the PT she is doing is strengthening her pelvic wall. She states that she is continuing to do the exercises at home and her last visit with the specialist is tomorrow. Will continue to monitor for new concerns.  Discussed plans with patient for ongoing care management follow up and provided patient with direct contact information for care management team Advised patient to call the office for changes in urinary incontinence patterns, questions or concerns; Provided education to patient re: good hygiene, working with the specialist, continuing to do the exercises at home; Reviewed scheduled/upcoming provider appointments including 09-07-2021 at 0900 am and with the specialist on 07-29-2021; Discussed plans with patient for ongoing care management follow up and provided patient with direct contact information for care management team; Advised patient to discuss new concerns with urinary health  with provider;   Patient Goals/Self-Care Activities: Patient will self administer medications as prescribed as evidenced by self report/primary caregiver report  Patient will attend all scheduled provider appointments as evidenced by clinician review of documented attendance to scheduled appointments and patient/caregiver report Patient will call pharmacy for medication refills as evidenced by patient report and review of pharmacy fill history as appropriate Patient will attend church or other social activities as evidenced by patient report Patient will continue to perform ADL's independently as evidenced by patient/caregiver report Patient will continue to perform IADL's independently as evidenced by patient/caregiver report Patient will call provider office for new concerns or questions as evidenced by review of documented incoming telephone call notes and patient report Patient will work with BSW to address care  coordination needs and will continue to work with the clinical team to address health care and disease management related needs as evidenced by documented adherence to scheduled care management/care coordination appointments - check blood pressure 3 times per week - choose a place to take my blood pressure (home, clinic or office, retail store) - write blood pressure results in a log or diary - learn about high blood pressure - keep a blood pressure log - take blood pressure log to all doctor appointments - call doctor for signs and symptoms of high blood pressure - develop an action plan for high blood pressure - keep all doctor appointments - take medications for blood pressure exactly as prescribed - report new symptoms to your doctor - eat more whole grains, fruits and vegetables, lean meats and healthy fats - call for medicine refill 2 or 3 days before it runs out - take all medications exactly as prescribed - call doctor with any symptoms you believe are related to your medicine - call doctor when you experience any new symptoms - go to all doctor appointments as scheduled - adhere to prescribed diet: heart Healthy        Plan:No further follow up required: the patient has met the goals of care and the care plan has been closed. The patient has the [facility] number to call for changes or needs.   Noreene Larsson  RN, MSN, [facility] Mobile: [phone]

## 2021-11-16 ENCOUNTER — Encounter: Payer: Self-pay | Admitting: Family Medicine

## 2021-11-20 DIAGNOSIS — L089 Local infection of the skin and subcutaneous tissue, unspecified: Secondary | ICD-10-CM | POA: Diagnosis not present

## 2021-11-20 DIAGNOSIS — Z08 Encounter for follow-up examination after completed treatment for malignant neoplasm: Secondary | ICD-10-CM | POA: Diagnosis not present

## 2021-11-20 DIAGNOSIS — B353 Tinea pedis: Secondary | ICD-10-CM | POA: Diagnosis not present

## 2021-11-20 DIAGNOSIS — X32XXXA Exposure to sunlight, initial encounter: Secondary | ICD-10-CM | POA: Diagnosis not present

## 2021-11-20 DIAGNOSIS — C44629 Squamous cell carcinoma of skin of left upper limb, including shoulder: Secondary | ICD-10-CM | POA: Diagnosis not present

## 2021-11-20 DIAGNOSIS — D485 Neoplasm of uncertain behavior of skin: Secondary | ICD-10-CM | POA: Diagnosis not present

## 2021-11-20 DIAGNOSIS — L570 Actinic keratosis: Secondary | ICD-10-CM | POA: Diagnosis not present

## 2021-11-20 DIAGNOSIS — Z85828 Personal history of other malignant neoplasm of skin: Secondary | ICD-10-CM | POA: Diagnosis not present

## 2021-11-23 DIAGNOSIS — Z8673 Personal history of transient ischemic attack (TIA), and cerebral infarction without residual deficits: Secondary | ICD-10-CM | POA: Diagnosis not present

## 2021-11-23 DIAGNOSIS — R4781 Slurred speech: Secondary | ICD-10-CM | POA: Diagnosis not present

## 2021-11-23 DIAGNOSIS — R4701 Aphasia: Secondary | ICD-10-CM | POA: Diagnosis not present

## 2021-11-26 DIAGNOSIS — E785 Hyperlipidemia, unspecified: Secondary | ICD-10-CM | POA: Diagnosis not present

## 2021-11-26 DIAGNOSIS — I10 Essential (primary) hypertension: Secondary | ICD-10-CM

## 2021-11-26 DIAGNOSIS — E039 Hypothyroidism, unspecified: Secondary | ICD-10-CM

## 2021-12-02 ENCOUNTER — Other Ambulatory Visit: Payer: Self-pay | Admitting: Family Medicine

## 2021-12-02 DIAGNOSIS — E039 Hypothyroidism, unspecified: Secondary | ICD-10-CM

## 2021-12-03 NOTE — Telephone Encounter (Signed)
OV 09/07/21- noted to treat as needed- normal results- will RF for next appointment- expired Rx Requested Prescriptions  Pending Prescriptions Disp Refills  . levothyroxine (SYNTHROID) 112 MCG tablet [Pharmacy Med Name: LEVOTHYROXINE 112 MCG TABLET] 90 tablet 3    Sig: TAKE 1 TABLET BY MOUTH DAILY BEFORE BREAKFAST.     Endocrinology:  Hypothyroid Agents Passed - 12/02/2021  2:08 AM      Passed - TSH in normal range and within 360 days    TSH  Date Value Ref Range Status  09/07/2021 1.570 0.450 - 4.500 uIU/mL Final         Passed - Valid encounter within last 12 months    Recent Outpatient Visits          2 months ago Hypothyroidism, unspecified type   [facility], Megan P, DO   6 months ago Essential hypertension   [place], Megan P, DO   9 months ago Routine general medical examination at a health care facility   [facility], [place], DO   1 year ago Benign hypertensive renal disease   [facility] [place], Megan P, DO   1 year ago Hypothyroidism, unspecified type   [facility] [place], Barb Merino, DO      Future Appointments            In 1 week Wynetta Emery, Barb Merino, DO [facility]

## 2021-12-08 ENCOUNTER — Ambulatory Visit: Payer: PPO | Admitting: Family Medicine

## 2021-12-15 ENCOUNTER — Encounter: Payer: Self-pay | Admitting: Family Medicine

## 2021-12-15 ENCOUNTER — Ambulatory Visit (INDEPENDENT_AMBULATORY_CARE_PROVIDER_SITE_OTHER): Payer: PPO | Admitting: Family Medicine

## 2021-12-15 DIAGNOSIS — E785 Hyperlipidemia, unspecified: Secondary | ICD-10-CM

## 2021-12-15 DIAGNOSIS — E039 Hypothyroidism, unspecified: Secondary | ICD-10-CM

## 2021-12-15 DIAGNOSIS — I129 Hypertensive chronic kidney disease with stage 1 through stage 4 chronic kidney disease, or unspecified chronic kidney disease: Secondary | ICD-10-CM | POA: Diagnosis not present

## 2021-12-15 MED ORDER — LEVOTHYROXINE SODIUM 112 MCG PO TABS: 112.0000 ug | ORAL_TABLET | Freq: Every day | ORAL | 3 refills | Status: DC

## 2021-12-15 NOTE — Progress Notes (Signed)
BP 137/61   Pulse 64   Temp 98.1 F (36.7 C)   Ht $R'5\' 3"'gV$  (1.6 m)   Wt 149 lb 12.8 oz (67.9 kg)   LMP 05/13/1980   SpO2 97%   BMI 26.54 kg/m    Subjective:    Patient ID: Cassie Brewer, female    DOB: Apr 16, 1928, 88 y.o.   MRN: [id number]  HPI: Cassie Brewer is a 88 y.o. female  Chief Complaint  Patient presents with   Hypothyroidism   Hypertension   Hyperlipidemia   HYPERTENSION / HYPERLIPIDEMIA Satisfied with current treatment? no Duration of hypertension: chronic BP monitoring frequency: not checking BP medication side effects: no Past BP meds: HCTZ, benazepril Duration of hyperlipidemia: chronic Cholesterol medication side effects: no Cholesterol supplements: fish oil Past cholesterol medications: atorvastatin Medication compliance: excellent compliance Aspirin: yes Recent stressors: no Recurrent headaches: no Visual changes: no Palpitations: no Dyspnea: no Chest pain: no Lower extremity edema: no Dizzy/lightheaded: no  Relevant past medical, surgical, family and social history reviewed and updated as indicated. Interim medical history since our last visit reviewed. Allergies and medications reviewed and updated.  Review of Systems  Constitutional: Negative.   Respiratory: Negative.    Cardiovascular: Negative.   Gastrointestinal: Negative.   Musculoskeletal: Negative.   Neurological: Negative.   Psychiatric/Behavioral: Negative.      Per HPI unless specifically indicated above     Objective:    BP 137/61   Pulse 64   Temp 98.1 F (36.7 C)   Ht $R'5\' 3"'wB$  (1.6 m)   Wt 149 lb 12.8 oz (67.9 kg)   LMP 05/13/1980   SpO2 97%   BMI 26.54 kg/m   Wt Readings from Last 3 Encounters:  12/15/21 149 lb 12.8 oz (67.9 kg)  09/07/21 150 lb 3.2 oz (68.1 kg)  06/04/21 147 lb 9.6 oz (67 kg)    Physical Exam Vitals and nursing note reviewed.  Constitutional:      General: She is not in acute distress.    Appearance: Normal appearance. She is not  ill-appearing, toxic-appearing or diaphoretic.  HENT:     Head: Normocephalic and atraumatic.     Right Ear: External ear normal.     Left Ear: External ear normal.     Nose: Nose normal.     Mouth/Throat:     Mouth: Mucous membranes are moist.     Pharynx: Oropharynx is clear.  Eyes:     General: No scleral icterus.       Right eye: No discharge.        Left eye: No discharge.     Extraocular Movements: Extraocular movements intact.     Conjunctiva/sclera: Conjunctivae normal.     Pupils: Pupils are equal, round, and reactive to light.  Cardiovascular:     Rate and Rhythm: Normal rate and regular rhythm.     Pulses: Normal pulses.     Heart sounds: Normal heart sounds. No murmur heard.    No friction rub. No gallop.  Pulmonary:     Effort: Pulmonary effort is normal. No respiratory distress.     Breath sounds: Normal breath sounds. No stridor. No wheezing, rhonchi or rales.  Chest:     Chest wall: No tenderness.  Musculoskeletal:        General: Normal range of motion.     Cervical back: Normal range of motion and neck supple.  Skin:    General: Skin is warm and dry.     Capillary Refill:  Capillary refill takes less than 2 seconds.     Coloration: Skin is not jaundiced or pale.     Findings: No bruising, erythema, lesion or rash.  Neurological:     General: No focal deficit present.     Mental Status: She is alert and oriented to person, place, and time. Mental status is at baseline.  Psychiatric:        Mood and Affect: Mood normal.        Behavior: Behavior normal.        Thought Content: Thought content normal.        Judgment: Judgment normal.     Results for orders placed or performed in visit on 09/07/21  Comprehensive metabolic panel  Result Value Ref Range   Glucose 107 (H) 70 - 99 mg/dL   BUN 19 10 - 36 mg/dL   Creatinine, Ser 1.01 (H) 0.57 - 1.00 mg/dL   eGFR 53 (L) >59 mL/min/1.73   BUN/Creatinine Ratio 19 12 - 28   Sodium 138 134 - 144 mmol/L    Potassium 4.1 3.5 - 5.2 mmol/L   Chloride 100 96 - 106 mmol/L   CO2 23 20 - 29 mmol/L   Calcium 9.4 8.7 - 10.3 mg/dL   Total Protein 6.7 6.0 - 8.5 g/dL   Albumin 4.1 3.5 - 4.6 g/dL   Globulin, Total 2.6 1.5 - 4.5 g/dL   Albumin/Globulin Ratio 1.6 1.2 - 2.2   Bilirubin Total 0.4 0.0 - 1.2 mg/dL   Alkaline Phosphatase 63 44 - 121 IU/L   AST 18 0 - 40 IU/L   ALT 14 0 - 32 IU/L  CBC with Differential/Platelet  Result Value Ref Range   WBC 5.3 3.4 - 10.8 x10E3/uL   RBC 3.92 3.77 - 5.28 x10E6/uL   Hemoglobin 11.9 11.1 - 15.9 g/dL   Hematocrit 34.4 34.0 - 46.6 %   MCV 88 79 - 97 fL   MCH 30.4 26.6 - 33.0 pg   MCHC 34.6 31.5 - 35.7 g/dL   RDW 13.1 11.7 - 15.4 %   Platelets 305 150 - 450 x10E3/uL   Neutrophils 57 Not Estab. %   Lymphs 24 Not Estab. %   Monocytes 11 Not Estab. %   Eos 6 Not Estab. %   Basos 1 Not Estab. %   Neutrophils Absolute 3.0 1.4 - 7.0 x10E3/uL   Lymphocytes Absolute 1.3 0.7 - 3.1 x10E3/uL   Monocytes Absolute 0.6 0.1 - 0.9 x10E3/uL   EOS (ABSOLUTE) 0.3 0.0 - 0.4 x10E3/uL   Basophils Absolute 0.1 0.0 - 0.2 x10E3/uL   Immature Granulocytes 1 Not Estab. %   Immature Grans (Abs) 0.0 0.0 - 0.1 x10E3/uL  Lipid Panel w/o Chol/HDL Ratio  Result Value Ref Range   Cholesterol, Total 154 100 - 199 mg/dL   Triglycerides 46 0 - 149 mg/dL   HDL 63 >39 mg/dL   VLDL Cholesterol Cal 10 5 - 40 mg/dL   LDL Chol Calc (NIH) 81 0 - 99 mg/dL  TSH  Result Value Ref Range   TSH 1.570 0.450 - 4.500 uIU/mL      Assessment & Plan:   Problem List Items Addressed This Visit       Endocrine   Hypothyroidism   Relevant Medications   levothyroxine (SYNTHROID) 112 MCG tablet     Genitourinary   Benign hypertensive renal disease    Under good control on current regimen. Continue current regimen. Continue to monitor. Call with any concerns. Refills up to  date.          Other   Hyperlipidemia    Under good control on current regimen. Continue current regimen. Continue to  monitor. Call with any concerns. Refills up to date.         Follow up plan: Return in about 3 months (around 03/16/2022).

## 2021-12-18 DIAGNOSIS — C44629 Squamous cell carcinoma of skin of left upper limb, including shoulder: Secondary | ICD-10-CM | POA: Diagnosis not present

## 2021-12-20 ENCOUNTER — Encounter: Payer: Self-pay | Admitting: Family Medicine

## 2021-12-20 NOTE — Assessment & Plan Note (Signed)
Under good control on current regimen. Continue current regimen. Continue to monitor. Call with any concerns. Refills up to date.   

## 2021-12-21 DIAGNOSIS — H26493 Other secondary cataract, bilateral: Secondary | ICD-10-CM | POA: Diagnosis not present

## 2021-12-21 DIAGNOSIS — H353131 Nonexudative age-related macular degeneration, bilateral, early dry stage: Secondary | ICD-10-CM | POA: Diagnosis not present

## 2021-12-21 DIAGNOSIS — H401131 Primary open-angle glaucoma, bilateral, mild stage: Secondary | ICD-10-CM | POA: Diagnosis not present

## 2021-12-22 ENCOUNTER — Ambulatory Visit: Payer: PPO | Admitting: Podiatry

## 2021-12-22 DIAGNOSIS — B351 Tinea unguium: Secondary | ICD-10-CM | POA: Diagnosis not present

## 2021-12-22 DIAGNOSIS — M79674 Pain in right toe(s): Secondary | ICD-10-CM

## 2021-12-22 DIAGNOSIS — M79675 Pain in left toe(s): Secondary | ICD-10-CM

## 2021-12-23 ENCOUNTER — Ambulatory Visit (INDEPENDENT_AMBULATORY_CARE_PROVIDER_SITE_OTHER): Payer: PPO

## 2021-12-23 DIAGNOSIS — Z23 Encounter for immunization: Secondary | ICD-10-CM | POA: Diagnosis not present

## 2021-12-26 NOTE — Progress Notes (Signed)
  Subjective:  Patient ID: Cassie Brewer, female    DOB: December 14, 1928,  MRN: [id number]  Chief Complaint  Patient presents with   Nail Problem   88 y.o. female returns for the above complaint.  Patient presents with thickened elongated mycotic toenails x10.  Patient is especially concerned for right hallux onychomycosis.  Patient not able to debride on herself.  She would like for [place] to debride down.  She does not have any secondary complaints. Objective:   There were no vitals filed for this visit. Podiatric Exam: Vascular: dorsalis pedis and posterior tibial pulses are palpable bilateral. Capillary return is immediate. Temperature gradient is WNL. Skin turgor WNL  Sensorium: Normal Semmes Weinstein monofilament test. Normal tactile sensation bilaterally.  However she experiences subjective neuropathic pain to the distal tip of the toes.  Intact sensation Nail Exam: Pt has thick disfigured discolored nails with subungual debris noted bilateral entire nail hallux through fifth toenails.  Ingrown noted to the right hallux lateral border.  Mild pain on palpation. Ulcer Exam: There is no evidence of ulcer or pre-ulcerative changes or infection. Orthopedic Exam: Muscle tone and strength are WNL. No limitations in general ROM. No crepitus or effusions noted. HAV  B/L.  Hammer toes 2-5  B/L. Skin: No Porokeratosis. No infection or ulcers.  Xerosis/psoriasis of the plantar foot with now subjective component of itching..  There is mild cracking noted.  No bleeding noted.  Assessment & Plan:  Patient was evaluated and treated and all questions answered.  Athlete's foot bilateral -Explained to patient the etiology of athlete's foot and various treatment options were discussed.  Given that there is subjective complaint of itching associated with the bottom of the foot I believe she will benefit from Lotrisone cream.  I have asked her to apply twice a day.  She states understanding.  Neuropathic pain -I  explained to the patient the etiology of neuropathic pain and various treatment options were extensively discussed.  I believe she will benefit from neuropathic cream to help decrease the pain associated with this -Prescription for neuropathic cream was sent to [facility] drug pharmacy.  Xerosis/psoriasis -I explained to the patient the etiology of dryness with cracking as well as various treatment options associated with it.  Given that she is doing good with clotrimazole cream that was given to her by dermatologist and had diagnosed her with psoriasis, I believe she can continue using the cream.  Patient agrees with this plan.  Onychomycosis with pain  -Nails palliatively debrided as below. -Educated on self-care  Procedure: Nail Debridement Rationale: pain  Type of Debridement: manual, sharp debridement. Instrumentation: Nail nipper, rotary burr. Number of Nails: 10  Procedures and Treatment: Consent by patient was obtained for treatment procedures. The patient understood the discussion of treatment and procedures well. All questions were answered thoroughly reviewed. Debridement of mycotic and hypertrophic toenails, 1 through 5 bilateral and clearing of subungual debris. No ulceration, no infection noted.  Return Visit-Office Procedure: Patient instructed to return to the office for a follow up visit 3 months for continued evaluation and treatment.  Boneta Lucks, DPM    No follow-ups on file.

## 2022-01-28 DIAGNOSIS — Z8673 Personal history of transient ischemic attack (TIA), and cerebral infarction without residual deficits: Secondary | ICD-10-CM | POA: Diagnosis not present

## 2022-01-28 DIAGNOSIS — Z1331 Encounter for screening for depression: Secondary | ICD-10-CM | POA: Diagnosis not present

## 2022-01-28 DIAGNOSIS — E785 Hyperlipidemia, unspecified: Secondary | ICD-10-CM | POA: Diagnosis not present

## 2022-01-28 DIAGNOSIS — R2681 Unsteadiness on feet: Secondary | ICD-10-CM | POA: Diagnosis not present

## 2022-01-28 DIAGNOSIS — H409 Unspecified glaucoma: Secondary | ICD-10-CM | POA: Diagnosis not present

## 2022-01-28 DIAGNOSIS — Z9181 History of falling: Secondary | ICD-10-CM | POA: Diagnosis not present

## 2022-01-28 DIAGNOSIS — E039 Hypothyroidism, unspecified: Secondary | ICD-10-CM | POA: Diagnosis not present

## 2022-01-28 DIAGNOSIS — I10 Essential (primary) hypertension: Secondary | ICD-10-CM | POA: Diagnosis not present

## 2022-01-28 DIAGNOSIS — Z780 Asymptomatic menopausal state: Secondary | ICD-10-CM | POA: Diagnosis not present

## 2022-03-04 DIAGNOSIS — N1831 Chronic kidney disease, stage 3a: Secondary | ICD-10-CM | POA: Diagnosis not present

## 2022-03-04 DIAGNOSIS — I10 Essential (primary) hypertension: Secondary | ICD-10-CM | POA: Diagnosis not present

## 2022-03-04 DIAGNOSIS — E782 Mixed hyperlipidemia: Secondary | ICD-10-CM | POA: Diagnosis not present

## 2022-03-04 DIAGNOSIS — I499 Cardiac arrhythmia, unspecified: Secondary | ICD-10-CM | POA: Diagnosis not present

## 2022-03-04 DIAGNOSIS — R0609 Other forms of dyspnea: Secondary | ICD-10-CM | POA: Diagnosis not present

## 2022-03-30 ENCOUNTER — Encounter: Payer: Self-pay | Admitting: Family Medicine

## 2022-03-30 ENCOUNTER — Ambulatory Visit (INDEPENDENT_AMBULATORY_CARE_PROVIDER_SITE_OTHER): Payer: PPO | Admitting: Family Medicine

## 2022-03-30 VITALS — BP 138/73 | HR 67 | Temp 97.9°F | Ht 63.0 in | Wt 148.4 lb

## 2022-03-30 DIAGNOSIS — I129 Hypertensive chronic kidney disease with stage 1 through stage 4 chronic kidney disease, or unspecified chronic kidney disease: Secondary | ICD-10-CM | POA: Diagnosis not present

## 2022-03-30 DIAGNOSIS — D692 Other nonthrombocytopenic purpura: Secondary | ICD-10-CM

## 2022-03-30 DIAGNOSIS — N183 Chronic kidney disease, stage 3 unspecified: Secondary | ICD-10-CM | POA: Diagnosis not present

## 2022-03-30 DIAGNOSIS — Z13820 Encounter for screening for osteoporosis: Secondary | ICD-10-CM | POA: Diagnosis not present

## 2022-03-30 DIAGNOSIS — Z0000 Encounter for general adult medical examination without abnormal findings: Secondary | ICD-10-CM | POA: Diagnosis not present

## 2022-03-30 DIAGNOSIS — E785 Hyperlipidemia, unspecified: Secondary | ICD-10-CM | POA: Diagnosis not present

## 2022-03-30 DIAGNOSIS — E039 Hypothyroidism, unspecified: Secondary | ICD-10-CM | POA: Diagnosis not present

## 2022-03-30 LAB — MICROSCOPIC EXAMINATION: Bacteria, UA: NONE SEEN

## 2022-03-30 LAB — URINALYSIS, ROUTINE W REFLEX MICROSCOPIC
Bilirubin, UA: NEGATIVE
Glucose, UA: NEGATIVE
Ketones, UA: NEGATIVE
Nitrite, UA: NEGATIVE
Protein,UA: NEGATIVE
Specific Gravity, UA: 1.015 (ref 1.005–1.030)
Urobilinogen, Ur: 1 mg/dL (ref 0.2–1.0)
pH, UA: 7 (ref 5.0–7.5)

## 2022-03-30 LAB — MICROALBUMIN, URINE WAIVED
Creatinine, Urine Waived: 50 mg/dL (ref 10–300)
Microalb, Ur Waived: 10 mg/L (ref 0–19)

## 2022-03-30 MED ORDER — HYDROCHLOROTHIAZIDE 25 MG PO TABS: 25.0000 mg | ORAL_TABLET | Freq: Every day | ORAL | 1 refills | Status: DC

## 2022-03-30 MED ORDER — BENAZEPRIL HCL 40 MG PO TABS: 40.0000 mg | ORAL_TABLET | Freq: Every day | ORAL | 1 refills | Status: DC

## 2022-03-30 MED ORDER — ASPIRIN 81 MG PO TBEC: 81.0000 mg | DELAYED_RELEASE_TABLET | Freq: Every day | ORAL | 12 refills | Status: AC

## 2022-03-30 MED ORDER — POLYETHYLENE GLYCOL 3350 17 GM/SCOOP PO POWD: 17.0000 g | Freq: Two times a day (BID) | ORAL | 1 refills | Status: AC | PRN

## 2022-03-30 MED ORDER — ATORVASTATIN CALCIUM 20 MG PO TABS: 20.0000 mg | ORAL_TABLET | Freq: Every day | ORAL | 1 refills | Status: DC

## 2022-03-30 NOTE — Patient Instructions (Signed)
Please call to schedule your bone density: [facility] at [place]: [address], [place], [place] [address] Phone: [phone]  [place] at [facility] [address] [place],  [place]  [address] Phone: [phone]

## 2022-03-30 NOTE — Assessment & Plan Note (Signed)
Under good control on current regimen. Continue current regimen. Continue to monitor. Call with any concerns. Refills given. Labs drawn today.   

## 2022-03-30 NOTE — Assessment & Plan Note (Signed)
Rechecking labs today. Await results. Treat as needed.  °

## 2022-03-30 NOTE — Progress Notes (Signed)
BP 138/73   Pulse 67   Temp 97.9 F (36.6 C) (Oral)   Ht _0  (1.6 m)   Wt 148 lb 6.4 oz (67.3 kg)   LMP 05/13/1980   SpO2 97%   BMI 26.29 kg/m    Subjective:    Patient ID: Cassie Brewer, female    DOB: May 03, 1928, 89 y.o.   MRN: [id number]  HPI: Cassie Brewer is a 89 y.o. female presenting on 03/30/2022 for comprehensive medical examination. Current medical complaints include:  HYPOTHYROIDISM Thyroid control status:controlled Satisfied with current treatment? yes Medication side effects: no Medication compliance: excellent compliance Recent dose adjustment:no Fatigue: no Cold intolerance: no Heat intolerance: no Weight gain: no Weight loss: no Constipation: no Diarrhea/loose stools: no Palpitations: no Lower extremity edema: no Anxiety/depressed mood: no  HYPERTENSION / HYPERLIPIDEMIA Satisfied with current treatment? yes Duration of hypertension: chronic BP monitoring frequency: not checking BP medication side effects: no Past BP meds: benazepril, HCTZ Duration of hyperlipidemia: chronic Cholesterol medication side effects: no Cholesterol supplements: none Past cholesterol medications:  Medication compliance: excellent compliance Aspirin: yes Recent stressors: no Recurrent headaches: no Visual changes: no Palpitations: no Dyspnea: no Chest pain: no Lower extremity edema: no Dizzy/lightheaded: no  She currently lives with: alone Menopausal Symptoms: no  Depression Screen done today and results listed below:     03/30/2022    9:20 AM 12/15/2021    9:13 AM 09/07/2021    9:05 AM 06/04/2021    8:36 AM 04/30/2021   11:36 AM  Depression screen PHQ 2/9  Decreased Interest 0 0 1 0 0  Down, Depressed, Hopeless 0 0 0 0 0  PHQ - 2 Score 0 0 1 0 0  Altered sleeping 0 0 0 0 0  Tired, decreased energy _1 0  Change in appetite 0 0 0 0 0  Feeling bad or failure about yourself  0 0 0 0 0  Trouble concentrating 0 0 0 1 0  Moving slowly or fidgety/restless 1  0 1 0 0  Suicidal thoughts 0 0 0 0 0  PHQ-9 Score _2 0  Difficult doing work/chores Not difficult at all Not difficult at all Not difficult at all      Past Medical History:  Past Medical History:  Diagnosis Date   Glaucoma    Hyperlipidemia    Hypertension    Hypothyroidism 10/02/2014   Overactive bladder    Squamous cell skin cancer, face     Surgical History:  Past Surgical History:  Procedure Laterality Date   BREAST SURGERY     THYROIDECTOMY, PARTIAL Right    TOTAL ABDOMINAL HYSTERECTOMY      Medications:  Current Outpatient Medications on File Prior to Visit  Medication Sig   Calcium Carbonate-Vit D-Min (CALCIUM 600+D PLUS MINERALS) 600-400 MG-UNIT TABS Take 1 tablet by mouth as directed.   clotrimazole-betamethasone (LOTRISONE) cream Apply 1 application topically 2 (two) times daily.   Desoximetasone (TOPICORT) 0.25 % ointment SMARTSIG:sparingly Topical Twice Daily   fluticasone (FLONASE) 50 MCG/ACT nasal spray Place 2 sprays into both nostrils daily as needed for allergies.   hydrocortisone (ANUSOL-HC) 2.5 % rectal cream Place 1 application  rectally 2 (two) times daily.   latanoprost (XALATAN) 0.005 % ophthalmic solution Place 1 drop into both eyes at bedtime.   levothyroxine (SYNTHROID) 112 MCG tablet Take 1 tablet (112 mcg total) by mouth daily before breakfast.   Multiple Vitamins-Minerals (CENTRUM SILVER ULTRA WOMENS PO) Take 1 tablet  by mouth daily.   Omega-3 Fatty Acids (FISH OIL) 1000 MG CAPS Take 1,000 mg by mouth daily.   timolol (TIMOPTIC) 0.5 % ophthalmic solution Place 1 drop into the left eye daily.   vitamin C (ASCORBIC ACID) 500 MG tablet Take 500 mg by mouth daily.   No current facility-administered medications on file prior to visit.    Allergies:  Allergies  Allergen Reactions   Amoxicillin     Other reaction(s): Other (See Comments) "blood pressure very high - almost passed out"   Eryc [Erythromycin] Other (See Comments)   Sulfa  Antibiotics Nausea And Vomiting    Nausea with bactrim in 2017  Nausea with bactrim in 2017     Social History:  Social History   Socioeconomic History   Marital status: Widowed    Spouse name: Not on file   Number of children: Not on file   Years of education: Not on file   Highest education level: Some college, no degree  Occupational History   Not on file  Tobacco Use   Smoking status: Never   Smokeless tobacco: Never  Vaping Use   Vaping Use: Never used  Substance and Sexual Activity   Alcohol use: No   Drug use: No   Sexual activity: Not Currently    Birth control/protection: Surgical  Other Topics Concern   Not on file  Social History Narrative   Not on file   Social Determinants of Health   Financial Resource Strain: Low Risk  (04/30/2021)   Overall Financial Resource Strain (CARDIA)    Difficulty of Paying Living Expenses: Not hard at all  Food Insecurity: No Food Insecurity (11/06/2021)   Hunger Vital Sign    Worried About Running Out of Food in the Last Year: Never true    Ran Out of Food in the Last Year: Never true  Transportation Needs: No Transportation Needs (11/06/2021)   PRAPARE - Hydrologist (Medical): No    Lack of Transportation (Non-Medical): No  Physical Activity: Insufficiently Active (04/30/2021)   Exercise Vital Sign    Days of Exercise per Week: 2 days    Minutes of Exercise per Session: 20 min  Stress: No Stress Concern Present (04/30/2021)   [place]    Feeling of Stress : Not at all  Social Connections: Moderately Integrated (04/30/2021)   Social Connection and Isolation Panel [NHANES]    Frequency of Communication with Friends and Family: More than three times a week    Frequency of Social Gatherings with Friends and Family: More than three times a week    Attends Religious Services: More than 4 times per year    Active Member of [employer] or  Organizations: Yes    Attends Archivist Meetings: More than 4 times per year    Marital Status: Widowed  Intimate Partner Violence: Not At Risk (11/06/2021)   Humiliation, Afraid, Rape, and Kick questionnaire    Fear of Current or Ex-Partner: No    Emotionally Abused: No    Physically Abused: No    Sexually Abused: No   Social History   Tobacco Use  Smoking Status Never  Smokeless Tobacco Never   Social History   Substance and Sexual Activity  Alcohol Use No    Family History:  Family History  Problem Relation Age of Onset   Hypertension Mother    Diabetes Father    Colon cancer Father  Breast cancer Grandchild     Past medical history, surgical history, medications, allergies, family history and social history reviewed with patient today and changes made to appropriate areas of the chart.   Review of Systems  Constitutional: Negative.   HENT: Negative.         Post-nasal drip  Eyes: Negative.   Respiratory:  Positive for cough. Negative for hemoptysis, sputum production, shortness of breath and wheezing.   Cardiovascular:  Positive for leg swelling. Negative for chest pain, palpitations, orthopnea, claudication and PND.  Gastrointestinal:  Positive for constipation. Negative for abdominal pain, blood in stool, diarrhea, heartburn, melena, nausea and vomiting.  Genitourinary: Negative.   Musculoskeletal: Negative.   Skin:  Positive for itching. Negative for rash.  Neurological: Negative.   Endo/Heme/Allergies:  Negative for environmental allergies and polydipsia. Bruises/bleeds easily.  Psychiatric/Behavioral: Negative.     All other ROS negative except what is listed above and in the HPI.      Objective:    BP 138/73   Pulse 67   Temp 97.9 F (36.6 C) (Oral)   Ht _0  (1.6 m)   Wt 148 lb 6.4 oz (67.3 kg)   LMP 05/13/1980   SpO2 97%   BMI 26.29 kg/m   Wt Readings from Last 3 Encounters:  03/30/22 148 lb 6.4 oz (67.3 kg)  12/15/21 149 lb  12.8 oz (67.9 kg)  09/07/21 150 lb 3.2 oz (68.1 kg)    Physical Exam Vitals and nursing note reviewed.  Constitutional:      General: She is not in acute distress.    Appearance: Normal appearance. She is normal weight. She is not ill-appearing, toxic-appearing or diaphoretic.  HENT:     Head: Normocephalic and atraumatic.     Right Ear: Tympanic membrane, ear canal and external ear normal. There is no impacted cerumen.     Left Ear: Tympanic membrane, ear canal and external ear normal. There is no impacted cerumen.     Nose: Nose normal. No congestion or rhinorrhea.     Mouth/Throat:     Mouth: Mucous membranes are moist.     Pharynx: Oropharynx is clear. No oropharyngeal exudate or posterior oropharyngeal erythema.  Eyes:     General: No scleral icterus.       Right eye: No discharge.        Left eye: No discharge.     Extraocular Movements: Extraocular movements intact.     Conjunctiva/sclera: Conjunctivae normal.     Pupils: Pupils are equal, round, and reactive to light.  Neck:     Vascular: No carotid bruit.  Cardiovascular:     Rate and Rhythm: Normal rate and regular rhythm.     Pulses: Normal pulses.     Heart sounds: No murmur heard.    No friction rub. No gallop.  Pulmonary:     Effort: Pulmonary effort is normal. No respiratory distress.     Breath sounds: Normal breath sounds. No stridor. No wheezing, rhonchi or rales.  Chest:     Chest wall: No tenderness.  Abdominal:     General: Abdomen is flat. Bowel sounds are normal. There is no distension.     Palpations: Abdomen is soft. There is no mass.     Tenderness: There is no abdominal tenderness. There is no right CVA tenderness, left CVA tenderness, guarding or rebound.     Hernia: No hernia is present.  Genitourinary:    Comments: Breast and pelvic exams deferred with shared decision making  Musculoskeletal:        General: No swelling, tenderness, deformity or signs of injury.     Cervical back: Normal range  of motion and neck supple. No rigidity. No muscular tenderness.     Right lower leg: No edema.     Left lower leg: No edema.  Lymphadenopathy:     Cervical: No cervical adenopathy.  Skin:    General: Skin is warm and dry.     Capillary Refill: Capillary refill takes less than 2 seconds.     Coloration: Skin is not jaundiced or pale.     Findings: No bruising, erythema, lesion or rash.  Neurological:     General: No focal deficit present.     Mental Status: She is alert and oriented to person, place, and time. Mental status is at baseline.     Cranial Nerves: No cranial nerve deficit.     Sensory: No sensory deficit.     Motor: No weakness.     Coordination: Coordination normal.     Gait: Gait normal.     Deep Tendon Reflexes: Reflexes normal.  Psychiatric:        Mood and Affect: Mood normal.        Behavior: Behavior normal.        Thought Content: Thought content normal.        Judgment: Judgment normal.     Results for orders placed or performed in visit on 09/07/21  Comprehensive metabolic panel  Result Value Ref Range   Glucose 107 (H) 70 - 99 mg/dL   BUN 19 10 - 36 mg/dL   Creatinine, Ser 1.01 (H) 0.57 - 1.00 mg/dL   eGFR 53 (L) >59 mL/min/1.73   BUN/Creatinine Ratio 19 12 - 28   Sodium 138 134 - 144 mmol/L   Potassium 4.1 3.5 - 5.2 mmol/L   Chloride 100 96 - 106 mmol/L   CO2 23 20 - 29 mmol/L   Calcium 9.4 8.7 - 10.3 mg/dL   Total Protein 6.7 6.0 - 8.5 g/dL   Albumin 4.1 3.5 - 4.6 g/dL   Globulin, Total 2.6 1.5 - 4.5 g/dL   Albumin/Globulin Ratio 1.6 1.2 - 2.2   Bilirubin Total 0.4 0.0 - 1.2 mg/dL   Alkaline Phosphatase 63 44 - 121 IU/L   AST 18 0 - 40 IU/L   ALT 14 0 - 32 IU/L  CBC with Differential/Platelet  Result Value Ref Range   WBC 5.3 3.4 - 10.8 x10E3/uL   RBC 3.92 3.77 - 5.28 x10E6/uL   Hemoglobin 11.9 11.1 - 15.9 g/dL   Hematocrit 34.4 34.0 - 46.6 %   MCV 88 79 - 97 fL   MCH 30.4 26.6 - 33.0 pg   MCHC 34.6 31.5 - 35.7 g/dL   RDW 13.1 11.7 -  15.4 %   Platelets 305 150 - 450 x10E3/uL   Neutrophils 57 Not Estab. %   Lymphs 24 Not Estab. %   Monocytes 11 Not Estab. %   Eos 6 Not Estab. %   Basos 1 Not Estab. %   Neutrophils Absolute 3.0 1.4 - 7.0 x10E3/uL   Lymphocytes Absolute 1.3 0.7 - 3.1 x10E3/uL   Monocytes Absolute 0.6 0.1 - 0.9 x10E3/uL   EOS (ABSOLUTE) 0.3 0.0 - 0.4 x10E3/uL   Basophils Absolute 0.1 0.0 - 0.2 x10E3/uL   Immature Granulocytes 1 Not Estab. %   Immature Grans (Abs) 0.0 0.0 - 0.1 x10E3/uL  Lipid Panel w/o Chol/HDL Ratio  Result Value Ref Range  Cholesterol, Total 154 100 - 199 mg/dL   Triglycerides 46 0 - 149 mg/dL   HDL 63 >39 mg/dL   VLDL Cholesterol Cal 10 5 - 40 mg/dL   LDL Chol Calc (NIH) 81 0 - 99 mg/dL  TSH  Result Value Ref Range   TSH 1.570 0.450 - 4.500 uIU/mL      Assessment & Plan:   Problem List Items Addressed This Visit       Cardiovascular and Mediastinum   Senile purpura ([place])    Reassured patient. Continue to monitor. Call with any concerns.       Relevant Medications   aspirin EC 81 MG tablet   atorvastatin (LIPITOR) 20 MG tablet   benazepril (LOTENSIN) 40 MG tablet   hydrochlorothiazide (HYDRODIURIL) 25 MG tablet     Endocrine   Hypothyroidism    Rechecking labs today. Await results. Treat as needed.       Relevant Orders   CBC with Differential/Platelet   Comprehensive metabolic panel     Genitourinary   Benign hypertensive renal disease    Under good control on current regimen. Continue current regimen. Continue to monitor. Call with any concerns. Refills given. Labs drawn today.        Relevant Medications   benazepril (LOTENSIN) 40 MG tablet   hydrochlorothiazide (HYDRODIURIL) 25 MG tablet   Other Relevant Orders   CBC with Differential/Platelet   Comprehensive metabolic panel   Urinalysis, Routine w reflex microscopic   TSH   Microalbumin, Urine Waived   CKD (chronic kidney disease), stage III ([place])    Rechecking labs today. Await results.  Treat as needed.        Other   Hyperlipidemia    Under good control on current regimen. Continue current regimen. Continue to monitor. Call with any concerns. Refills given. Labs drawn today.       Relevant Medications   aspirin EC 81 MG tablet   atorvastatin (LIPITOR) 20 MG tablet   benazepril (LOTENSIN) 40 MG tablet   hydrochlorothiazide (HYDRODIURIL) 25 MG tablet   Other Relevant Orders   CBC with Differential/Platelet   Comprehensive metabolic panel   Lipid Panel w/o Chol/HDL Ratio   Other Visit Diagnoses     Routine general medical examination at a health care facility    -  Primary   Vaccines up to date. Screening labs checked today. DEXA ordered today. Continue diet and exercise. Call with any concerns.   Screening for osteoporosis       Relevant Orders   DG Bone Density        Follow up plan: Return in about 3 months (around 06/29/2022).   LABORATORY TESTING:  - Pap smear: not applicable  IMMUNIZATIONS:   - Tdap: Tetanus vaccination status reviewed: last tetanus booster within 10 years. - Influenza: Up to date - Pneumovax: Up to date - Prevnar: Up to date - COVID: Up to date - HPV: Not applicable - Shingrix vaccine: Refused  SCREENING: -Mammogram: Not applicable  - Colonoscopy: Not applicable  - Bone Density: Ordered today   PATIENT COUNSELING:   Advised to take 1 mg of folate supplement per day if capable of pregnancy.   Sexuality: Discussed sexually transmitted diseases, partner selection, use of condoms, avoidance of unintended pregnancy  and contraceptive alternatives.   Advised to avoid cigarette smoking.  I discussed with the patient that most people either abstain from alcohol or drink within safe limits (<=14/week and <=4 drinks/occasion for males, <=7/weeks and <= 3 drinks/occasion  for females) and that the risk for alcohol disorders and other health effects rises proportionally with the number of drinks per week and how often a drinker  exceeds daily limits.  Discussed cessation/primary prevention of drug use and availability of treatment for abuse.   Diet: Encouraged to adjust caloric intake to maintain  or achieve ideal body weight, to reduce intake of dietary saturated fat and total fat, to limit sodium intake by avoiding high sodium foods and not adding table salt, and to maintain adequate dietary potassium and calcium preferably from fresh fruits, vegetables, and low-fat dairy products.    stressed the importance of regular exercise  Injury prevention: Discussed safety belts, safety helmets, smoke detector, smoking near bedding or upholstery.   Dental health: Discussed importance of regular tooth brushing, flossing, and dental visits.    NEXT PREVENTATIVE PHYSICAL DUE IN 1 YEAR. Return in about 3 months (around 06/29/2022).

## 2022-03-30 NOTE — Assessment & Plan Note (Signed)
Reassured patient. Continue to monitor. Call with any concerns.  

## 2022-03-31 LAB — CBC WITH DIFFERENTIAL/PLATELET
Basophils Absolute: 0 10*3/uL (ref 0.0–0.2)
Basos: 1 %
EOS (ABSOLUTE): 0.1 10*3/uL (ref 0.0–0.4)
Eos: 3 %
Hematocrit: 35.3 % (ref 34.0–46.6)
Hemoglobin: 12 g/dL (ref 11.1–15.9)
Immature Grans (Abs): 0 10*3/uL (ref 0.0–0.1)
Immature Granulocytes: 0 %
Lymphocytes Absolute: 1 10*3/uL (ref 0.7–3.1)
Lymphs: 23 %
MCH: 30.1 pg (ref 26.6–33.0)
MCHC: 34 g/dL (ref 31.5–35.7)
MCV: 89 fL (ref 79–97)
Monocytes Absolute: 0.6 10*3/uL (ref 0.1–0.9)
Monocytes: 13 %
Neutrophils Absolute: 2.6 10*3/uL (ref 1.4–7.0)
Neutrophils: 60 %
Platelets: 373 10*3/uL (ref 150–450)
RBC: 3.99 x10E6/uL (ref 3.77–5.28)
RDW: 12.7 % (ref 11.7–15.4)
WBC: 4.3 10*3/uL (ref 3.4–10.8)

## 2022-03-31 LAB — COMPREHENSIVE METABOLIC PANEL
ALT: 14 IU/L (ref 0–32)
AST: 13 IU/L (ref 0–40)
Albumin/Globulin Ratio: 1.5 (ref 1.2–2.2)
Albumin: 4 g/dL (ref 3.6–4.6)
Alkaline Phosphatase: 63 IU/L (ref 44–121)
BUN/Creatinine Ratio: 26 (ref 12–28)
BUN: 25 mg/dL (ref 10–36)
Bilirubin Total: 0.6 mg/dL (ref 0.0–1.2)
CO2: 25 mmol/L (ref 20–29)
Calcium: 9.6 mg/dL (ref 8.7–10.3)
Chloride: 98 mmol/L (ref 96–106)
Creatinine, Ser: 0.95 mg/dL (ref 0.57–1.00)
Globulin, Total: 2.6 g/dL (ref 1.5–4.5)
Glucose: 104 mg/dL — ABNORMAL HIGH (ref 70–99)
Potassium: 4 mmol/L (ref 3.5–5.2)
Sodium: 135 mmol/L (ref 134–144)
Total Protein: 6.6 g/dL (ref 6.0–8.5)
eGFR: 57 mL/min/{1.73_m2} — ABNORMAL LOW (ref >59)

## 2022-03-31 LAB — LIPID PANEL W/O CHOL/HDL RATIO
Cholesterol, Total: 158 mg/dL (ref 100–199)
HDL: 61 mg/dL (ref >39)
LDL Chol Calc (NIH): 84 mg/dL (ref 0–99)
Triglycerides: 68 mg/dL (ref 0–149)
VLDL Cholesterol Cal: 13 mg/dL (ref 5–40)

## 2022-03-31 LAB — TSH: TSH: 0.783 u[IU]/mL (ref 0.450–4.500)

## 2022-04-01 ENCOUNTER — Encounter: Payer: Self-pay | Admitting: Podiatry

## 2022-04-01 ENCOUNTER — Ambulatory Visit: Payer: PPO | Admitting: Podiatry

## 2022-04-01 DIAGNOSIS — B351 Tinea unguium: Secondary | ICD-10-CM

## 2022-04-01 DIAGNOSIS — M79675 Pain in left toe(s): Secondary | ICD-10-CM

## 2022-04-01 DIAGNOSIS — M79674 Pain in right toe(s): Secondary | ICD-10-CM

## 2022-04-01 NOTE — Progress Notes (Signed)
  Subjective:  Patient ID: Cassie Brewer, female    DOB: 23-Apr-1928,  MRN: [id number]  Chief Complaint  Patient presents with   Nail Problem   89 y.o. female returns for the above complaint.  Patient presents with thickened elongated mycotic toenails x10.  Patient is especially concerned for right hallux onychomycosis.  Patient not able to debride on herself.  She would like for [place] to debride down.  She does not have any secondary complaints. Objective:   There were no vitals filed for this visit. Podiatric Exam: Vascular: dorsalis pedis and posterior tibial pulses are palpable bilateral. Capillary return is immediate. Temperature gradient is WNL. Skin turgor WNL  Sensorium: Normal Semmes Weinstein monofilament test. Normal tactile sensation bilaterally.  However she experiences subjective neuropathic pain to the distal tip of the toes.  Intact sensation Nail Exam: Pt has thick disfigured discolored nails with subungual debris noted bilateral entire nail hallux through fifth toenails.  Ingrown noted to the right hallux lateral border.  Mild pain on palpation. Ulcer Exam: There is no evidence of ulcer or pre-ulcerative changes or infection. Orthopedic Exam: Muscle tone and strength are WNL. No limitations in general ROM. No crepitus or effusions noted. HAV  B/L.  Hammer toes 2-5  B/L. Skin: No Porokeratosis. No infection or ulcers.  Xerosis/psoriasis of the plantar foot with now subjective component of itching..  There is mild cracking noted.  No bleeding noted.  Assessment & Plan:  Patient was evaluated and treated and all questions answered.  Athlete's foot bilateral -Explained to patient the etiology of athlete's foot and various treatment options were discussed.  Given that there is subjective complaint of itching associated with the bottom of the foot I believe she will benefit from Lotrisone cream.  I have asked her to apply twice a day.  She states understanding.  Neuropathic  pain -doing well  Xerosis/psoriasis -I explained to the patient the etiology of dryness with cracking as well as various treatment options associated with it.  Given that she is doing good with clotrimazole cream that was given to her by dermatologist and had diagnosed her with psoriasis, I believe she can continue using the cream.  Patient agrees with this plan.  Onychomycosis with pain  -Nails palliatively debrided as below. -Educated on self-care  Procedure: Nail Debridement Rationale: pain  Type of Debridement: manual, sharp debridement. Instrumentation: Nail nipper, rotary burr. Number of Nails: 10  Procedures and Treatment: Consent by patient was obtained for treatment procedures. The patient understood the discussion of treatment and procedures well. All questions were answered thoroughly reviewed. Debridement of mycotic and hypertrophic toenails, 1 through 5 bilateral and clearing of subungual debris. No ulceration, no infection noted.  Return Visit-Office Procedure: Patient instructed to return to the office for a follow up visit 3 months for continued evaluation and treatment.  Boneta Lucks, DPM    No follow-ups on file.

## 2022-04-09 ENCOUNTER — Encounter: Payer: Self-pay | Admitting: Family Medicine

## 2022-05-18 ENCOUNTER — Other Ambulatory Visit: Payer: Self-pay | Admitting: Family Medicine

## 2022-05-18 DIAGNOSIS — Z1231 Encounter for screening mammogram for malignant neoplasm of breast: Secondary | ICD-10-CM

## 2022-05-31 ENCOUNTER — Ambulatory Visit (INDEPENDENT_AMBULATORY_CARE_PROVIDER_SITE_OTHER): Payer: PPO

## 2022-05-31 VITALS — Ht 63.0 in | Wt 148.0 lb

## 2022-05-31 DIAGNOSIS — Z0000 Encounter for general adult medical examination without abnormal findings: Secondary | ICD-10-CM | POA: Diagnosis not present

## 2022-05-31 NOTE — Patient Instructions (Signed)
Cassie Brewer , Thank you for taking time to come for your Medicare Wellness Visit. I appreciate your ongoing commitment to your health goals. Please review the following plan we discussed and let me know if I can assist you in the future.   These are the goals we discussed:  Goals      DIET - EAT MORE FRUITS AND VEGETABLES     Patient Stated     Continue current lifestyle        This is a list of the screening recommended for you and due dates:  Health Maintenance  Topic Date Due   Zoster (Shingles) Vaccine (1 of 2) Never done   COVID-19 Vaccine (4 - 2023-24 season) 11/27/2021   Medicare Annual Wellness Visit  05/31/2023   DTaP/Tdap/Td vaccine (3 - Tdap) 11/11/2026   Pneumonia Vaccine  Completed   Flu Shot  Completed   DEXA scan (bone density measurement)  Completed   HPV Vaccine  Aged Out    Advanced directives: no  Conditions/risks identified: none  Next appointment: Follow up in one year for your annual wellness visit 06/06/23 @ 10:00 am by phone   Preventive Care 65 Years and Older, Female Preventive care refers to lifestyle choices and visits with your health care provider that can promote health and wellness. What does preventive care include? A yearly physical exam. This is also called an annual well check. Dental exams once or twice a year. Routine eye exams. Ask your health care provider how often you should have your eyes checked. Personal lifestyle choices, including: Daily care of your teeth and gums. Regular physical activity. Eating a healthy diet. Avoiding tobacco and drug use. Limiting alcohol use. Practicing safe sex. Taking low-dose aspirin every day. Taking vitamin and mineral supplements as recommended by your health care provider. What happens during an annual well check? The services and screenings done by your health care provider during your annual well check will depend on your age, overall health, lifestyle risk factors, and family history of  disease. Counseling  Your health care provider may ask you questions about your: Alcohol use. Tobacco use. Drug use. Emotional well-being. Home and relationship well-being. Sexual activity. Eating habits. History of falls. Memory and ability to understand (cognition). Work and work Statistician. Reproductive health. Screening  You may have the following tests or measurements: Height, weight, and BMI. Blood pressure. Lipid and cholesterol levels. These may be checked every 5 years, or more frequently if you are over 27 years old. Skin check. Lung cancer screening. You may have this screening every year starting at age 1 if you have a 30-pack-year history of smoking and currently smoke or have quit within the past 15 years. Fecal occult blood test (FOBT) of the stool. You may have this test every year starting at age 92. Flexible sigmoidoscopy or colonoscopy. You may have a sigmoidoscopy every 5 years or a colonoscopy every 10 years starting at age 9. Hepatitis C blood test. Hepatitis B blood test. Sexually transmitted disease (STD) testing. Diabetes screening. This is done by checking your blood sugar (glucose) after you have not eaten for a while (fasting). You may have this done every 1-3 years. Bone density scan. This is done to screen for osteoporosis. You may have this done starting at age 31. Mammogram. This may be done every 1-2 years. Talk to your health care provider about how often you should have regular mammograms. Talk with your health care provider about your test results, treatment options, and  if necessary, the need for more tests. Vaccines  Your health care provider may recommend certain vaccines, such as: Influenza vaccine. This is recommended every year. Tetanus, diphtheria, and acellular pertussis (Tdap, Td) vaccine. You may need a Td booster every 10 years. Zoster vaccine. You may need this after age 30. Pneumococcal 13-valent conjugate (PCV13) vaccine. One  dose is recommended after age 36. Pneumococcal polysaccharide (PPSV23) vaccine. One dose is recommended after age 79. Talk to your health care provider about which screenings and vaccines you need and how often you need them. This information is not intended to replace advice given to you by your health care provider. Make sure you discuss any questions you have with your health care provider. Document Released: 04/11/2015 Document Revised: 12/03/2015 Document Reviewed: 01/14/2015 Elsevier Interactive Patient Education  2017 [place] Prevention in the Home Falls can cause injuries. They can happen to people of all ages. There are many things you can do to make your home safe and to help prevent falls. What can I do on the outside of my home? Regularly fix the edges of walkways and driveways and fix any cracks. Remove anything that might make you trip as you walk through a door, such as a raised step or threshold. Trim any bushes or trees on the path to your home. Use bright outdoor lighting. Clear any walking paths of anything that might make someone trip, such as rocks or tools. Regularly check to see if handrails are loose or broken. Make sure that both sides of any steps have handrails. Any raised decks and porches should have guardrails on the edges. Have any leaves, snow, or ice cleared regularly. Use sand or salt on walking paths during winter. Clean up any spills in your garage right away. This includes oil or grease spills. What can I do in the bathroom? Use night lights. Install grab bars by the toilet and in the tub and shower. Do not use towel bars as grab bars. Use non-skid mats or decals in the tub or shower. If you need to sit down in the shower, use a plastic, non-slip stool. Keep the floor dry. Clean up any water that spills on the floor as soon as it happens. Remove soap buildup in the tub or shower regularly. Attach bath mats securely with double-sided  non-slip rug tape. Do not have throw rugs and other things on the floor that can make you trip. What can I do in the bedroom? Use night lights. Make sure that you have a light by your bed that is easy to reach. Do not use any sheets or blankets that are too big for your bed. They should not hang down onto the floor. Have a firm chair that has side arms. You can use this for support while you get dressed. Do not have throw rugs and other things on the floor that can make you trip. What can I do in the kitchen? Clean up any spills right away. Avoid walking on wet floors. Keep items that you use a lot in easy-to-reach places. If you need to reach something above you, use a strong step stool that has a grab bar. Keep electrical cords out of the way. Do not use floor polish or wax that makes floors slippery. If you must use wax, use non-skid floor wax. Do not have throw rugs and other things on the floor that can make you trip. What can I do with my stairs? Do not leave any  items on the stairs. Make sure that there are handrails on both sides of the stairs and use them. Fix handrails that are broken or loose. Make sure that handrails are as long as the stairways. Check any carpeting to make sure that it is firmly attached to the stairs. Fix any carpet that is loose or worn. Avoid having throw rugs at the top or bottom of the stairs. If you do have throw rugs, attach them to the floor with carpet tape. Make sure that you have a light switch at the top of the stairs and the bottom of the stairs. If you do not have them, ask someone to add them for you. What else can I do to help prevent falls? Wear shoes that: Do not have high heels. Have rubber bottoms. Are comfortable and fit you well. Are closed at the toe. Do not wear sandals. If you use a stepladder: Make sure that it is fully opened. Do not climb a closed stepladder. Make sure that both sides of the stepladder are locked into place. Ask  someone to hold it for you, if possible. Clearly mark and make sure that you can see: Any grab bars or handrails. First and last steps. Where the edge of each step is. Use tools that help you move around (mobility aids) if they are needed. These include: Canes. Walkers. Scooters. Crutches. Turn on the lights when you go into a dark area. Replace any light bulbs as soon as they burn out. Set up your furniture so you have a clear path. Avoid moving your furniture around. If any of your floors are uneven, fix them. If there are any pets around you, be aware of where they are. Review your medicines with your doctor. Some medicines can make you feel dizzy. This can increase your chance of falling. Ask your doctor what other things that you can do to help prevent falls. This information is not intended to replace advice given to you by your health care provider. Make sure you discuss any questions you have with your health care provider. Document Released: 01/09/2009 Document Revised: 08/21/2015 Document Reviewed: 04/19/2014 Elsevier Interactive Patient Education  2017 [employer].

## 2022-05-31 NOTE — Progress Notes (Signed)
I connected with  Cassie Brewer on 05/31/22 by a audio enabled telemedicine application and verified that I am speaking with the correct person using two identifiers.  Patient Location: Home  Provider Location: Office/Clinic  I discussed the limitations of evaluation and management by telemedicine. The patient expressed understanding and agreed to proceed.  Subjective:   Cassie Brewer is a 89 y.o. female who presents for Medicare Annual (Subsequent) preventive examination.  Review of Systems     Cardiac Risk Factors include: advanced age (>5mn, >>23women);hypertension     Objective:    There were no vitals filed for this visit. There is no height or weight on file to calculate BMI.     05/31/2022   10:35 AM 04/30/2021   11:26 AM 04/28/2020   11:17 AM 03/25/2020   12:18 PM 03/10/2020   12:54 PM 03/10/2020   11:04 AM 11/16/2018    9:01 AM  Advanced Directives  Does Patient Have a Medical Advance Directive? No Yes Yes No No No Yes  Type of AArmed forces technical officerof AJeffersonLiving will    Living will;Healthcare Power of AKenoshain Chart?  No - copy requested No - copy requested    No - copy requested  Would patient like information on creating a medical advance directive? No - Patient declined     No - Patient declined     Current Medications (verified) Outpatient Encounter Medications as of 05/31/2022  Medication Sig   aspirin EC 81 MG tablet Take 1 tablet (81 mg total) by mouth daily. Swallow whole.   atorvastatin (LIPITOR) 20 MG tablet Take 1 tablet (20 mg total) by mouth daily.   Calcium Carbonate-Vit D-Min (CALCIUM 600+D PLUS MINERALS) 600-400 MG-UNIT TABS Take 1 tablet by mouth as directed.   Desoximetasone (TOPICORT) 0.25 % ointment SMARTSIG:sparingly Topical Twice Daily   fluticasone (FLONASE) 50 MCG/ACT nasal spray Place 2 sprays into both nostrils daily as needed for allergies.    hydrochlorothiazide (HYDRODIURIL) 25 MG tablet Take 1 tablet (25 mg total) by mouth daily.   hydrocortisone (ANUSOL-HC) 2.5 % rectal cream Place 1 application  rectally 2 (two) times daily.   latanoprost (XALATAN) 0.005 % ophthalmic solution Place 1 drop into both eyes at bedtime.   levothyroxine (SYNTHROID) 112 MCG tablet Take 1 tablet (112 mcg total) by mouth daily before breakfast.   Multiple Vitamins-Minerals (CENTRUM SILVER ULTRA WOMENS PO) Take 1 tablet by mouth daily.   Omega-3 Fatty Acids (FISH OIL) 1000 MG CAPS Take 1,000 mg by mouth daily.   polyethylene glycol powder (GLYCOLAX/MIRALAX) 17 GM/SCOOP powder Take 17 g by mouth 2 (two) times daily as needed.   timolol (TIMOPTIC) 0.5 % ophthalmic solution Place 1 drop into the left eye daily.   benazepril (LOTENSIN) 40 MG tablet Take 1 tablet (40 mg total) by mouth daily.   clotrimazole-betamethasone (LOTRISONE) cream Apply 1 application topically 2 (two) times daily. (Patient not taking: Reported on 05/31/2022)   vitamin C (ASCORBIC ACID) 500 MG tablet Take 500 mg by mouth daily.   No facility-administered encounter medications on file as of 05/31/2022.    Allergies (verified) Amoxicillin, Eryc [erythromycin], and Sulfa antibiotics   History: Past Medical History:  Diagnosis Date   Glaucoma    Hyperlipidemia    Hypertension    Hypothyroidism 10/02/2014   Overactive bladder    Squamous cell skin cancer, face    Past Surgical History:  Procedure Laterality  Date   BREAST SURGERY     THYROIDECTOMY, PARTIAL Right    TOTAL ABDOMINAL HYSTERECTOMY     Family History  Problem Relation Age of Onset   Hypertension Mother    Diabetes Father    Colon cancer Father    Breast cancer Grandchild    Social History   Socioeconomic History   Marital status: Widowed    Spouse name: Not on file   Number of children: Not on file   Years of education: Not on file   Highest education level: Some college, no degree  Occupational History   Not  on file  Tobacco Use   Smoking status: Never   Smokeless tobacco: Never  Vaping Use   Vaping Use: Never used  Substance and Sexual Activity   Alcohol use: No   Drug use: No   Sexual activity: Not Currently    Birth control/protection: Surgical  Other Topics Concern   Not on file  Social History Narrative   Not on file   Social Determinants of Health   Financial Resource Strain: Low Risk  (05/31/2022)   Overall Financial Resource Strain (CARDIA)    Difficulty of Paying Living Expenses: Not hard at all  Food Insecurity: No Food Insecurity (05/31/2022)   Hunger Vital Sign    Worried About Running Out of Food in the Last Year: Never true    [place] in the Last Year: Never true  Transportation Needs: No Transportation Needs (05/31/2022)   PRAPARE - Hydrologist (Medical): No    Lack of Transportation (Non-Medical): No  Physical Activity: Insufficiently Active (05/31/2022)   Exercise Vital Sign    Days of Exercise per Week: 2 days    Minutes of Exercise per Session: 20 min  Stress: No Stress Concern Present (05/31/2022)   Savage    Feeling of Stress : Not at all  Social Connections: Moderately Integrated (05/31/2022)   Social Connection and Isolation Panel [NHANES]    Frequency of Communication with Friends and Family: More than three times a week    Frequency of Social Gatherings with Friends and Family: Three times a week    Attends Religious Services: More than 4 times per year    Active Member of Clubs or Organizations: Yes    Attends Archivist Meetings: More than 4 times per year    Marital Status: Widowed    Tobacco Counseling Counseling given: Not Answered   Clinical Intake:  Pre-visit preparation completed: Yes  Pain : No/denies pain     Nutritional Risks: None Diabetes: No  How often do you need to have someone help you when you read instructions,  pamphlets, or other written materials from your doctor or pharmacy?: 1 - Never  Diabetic?no  Interpreter Needed?: No  Information entered by :: Kirke Shaggy, LPN   Activities of Daily Living    05/31/2022   10:36 AM  In your present state of health, do you have any difficulty performing the following activities:  Hearing? 1  Vision? 0  Difficulty concentrating or making decisions? 0  Walking or climbing stairs? 0  Dressing or bathing? 0  Doing errands, shopping? 0  Preparing Food and eating ? N  Using the Toilet? N  In the past six months, have you accidently leaked urine? N  Do you have problems with loss of bowel control? N  Managing your Medications? N  Managing your Finances?  N  Housekeeping or managing your Housekeeping? N    Patient Care Team: Valerie Roys, DO as PCP - General (Family Medicine) Oneta Rack, MD (Dermatology) Sharlotte Alamo, DPM (Podiatry) Samara Deist, DPM as Referring Physician (Podiatry)  Indicate any recent Medical Services you may have received from other than [facility] providers in the past year (date may be approximate).     Assessment:   This is a routine wellness examination for Euniqua.  Hearing/Vision screen Hearing Screening - Comments:: No aids Vision Screening - Comments:: Wears glasses- Dr.Woodard  Dietary issues and exercise activities discussed: Current Exercise Habits: Home exercise routine, Type of exercise: walking, Time (Minutes): 20, Frequency (Times/Week): 2, Weekly Exercise (Minutes/Week): 40, Intensity: Mild   Goals Addressed             This Visit's Progress    DIET - EAT MORE FRUITS AND VEGETABLES         Depression Screen    05/31/2022   10:33 AM 03/30/2022    9:20 AM 12/15/2021    9:13 AM 09/07/2021    9:05 AM 06/04/2021    8:36 AM 04/30/2021   11:36 AM 03/06/2021    9:41 AM  PHQ 2/9 Scores  PHQ - 2 Score 0 0 0 1 0 0 0  PHQ- 9 Score 0 '2 1 3 2 '$ 0 1    Fall Risk    05/31/2022   10:36 AM 03/30/2022    9:20 AM  12/15/2021    9:18 AM 09/07/2021    9:06 AM 04/30/2021   11:21 AM  Fall Risk   Falls in the past year? 0 0 0 0 0  Number falls in past yr: 0 0 0 0 0  Injury with Fall? 0 0 0 0 0  Risk for fall due to : No Fall Risks No Fall Risks No Fall Risks No Fall Risks   Follow up Falls prevention discussed;Falls evaluation completed Falls evaluation completed Falls evaluation completed Falls evaluation completed Falls evaluation completed;Falls prevention discussed    FALL RISK PREVENTION PERTAINING TO THE HOME:  Any stairs in or around the home? Yes  If so, are there any without handrails? No  Home free of loose throw rugs in walkways, pet beds, electrical cords, etc? Yes  Adequate lighting in your home to reduce risk of falls? Yes   ASSISTIVE DEVICES UTILIZED TO PREVENT FALLS:  Life alert? Yes  Use of a cane, walker or w/c? Yes uses cane Grab bars in the bathroom? Yes  Shower chair or bench in shower? No  Elevated toilet seat or a handicapped toilet? Yes    Cognitive Function:        05/31/2022   10:42 AM 04/28/2020   11:23 AM 11/14/2017   10:48 AM 11/03/2016   10:50 AM  6CIT Screen  What Year? 0 points 0 points 0 points 0 points  What month? 0 points 0 points 0 points 0 points  What time? 0 points 0 points 0 points 0 points  Count back from 20 0 points 0 points 0 points 0 points  Months in reverse 0 points 0 points 0 points 0 points  Repeat phrase 0 points 0 points 0 points 0 points  Total Score 0 points 0 points 0 points 0 points    Immunizations Immunization History  Administered Date(s) Administered   Fluad Quad(high Dose 65+) 11/27/2018, 11/29/2019, 01/23/2021, 12/23/2021   Influenza, High Dose Seasonal PF 01/30/2016, 05/13/2017, 11/24/2017   Influenza,inj,Quad PF,6+ Mos 01/21/2015  Influenza-Unspecified 11/24/2017   PFIZER(Purple Top)SARS-COV-2 Vaccination 04/20/2019, 05/11/2019, 03/18/2020   Pneumococcal Conjugate-13 10/01/2013   Pneumococcal Polysaccharide-23 06/06/2006,  09/28/2006   Td 09/28/2006, 11/10/2016    TDAP status: Up to date  Flu Vaccine status: Up to date  Pneumococcal vaccine status: Up to date  Covid-19 vaccine status: Completed vaccines  Qualifies for Shingles Vaccine? No   Zostavax completed No   Shingrix Completed?: No.    Education has been provided regarding the importance of this vaccine. Patient has been advised to call insurance company to determine out of pocket expense if they have not yet received this vaccine. Advised may also receive vaccine at local pharmacy or Health Dept. Verbalized acceptance and understanding.  Screening Tests Health Maintenance  Topic Date Due   Zoster Vaccines- Shingrix (1 of 2) Never done   COVID-19 Vaccine (4 - 2023-24 season) 11/27/2021   Medicare Annual Wellness (AWV)  05/31/2023   DTaP/Tdap/Td (3 - Tdap) 11/11/2026   Pneumonia Vaccine 55+ Years old  Completed   INFLUENZA VACCINE  Completed   DEXA SCAN  Completed   HPV VACCINES  Aged Out    Health Maintenance  Health Maintenance Due  Topic Date Due   Zoster Vaccines- Shingrix (1 of 2) Never done   COVID-19 Vaccine (4 - 2023-24 season) 11/27/2021    Colorectal cancer screening: No longer required.   Mammogram status: No longer required due to age.   Lung Cancer Screening: (Low Dose CT Chest recommended if Age 20-80 years, 30 pack-year currently smoking OR have quit w/in 15years.) does not qualify.   Additional Screening:  Hepatitis C Screening: does not qualify; Completed no  Vision Screening: Recommended annual ophthalmology exams for early detection of glaucoma and other disorders of the eye. Is the patient up to date with their annual eye exam?  Yes  Who is the provider or what is the name of the office in which the patient attends annual eye exams? Dr.Woodard If pt is not established with a provider, would they like to be referred to a provider to establish care? No .   Dental Screening: Recommended annual dental exams for  proper oral hygiene  Community Resource Referral / Chronic Care Management: CRR required this visit?  No   CCM required this visit?  No      Plan:     I have personally reviewed and noted the following in the patient's chart:   Medical and social history Use of alcohol, tobacco or illicit drugs  Current medications and supplements including opioid prescriptions. Patient is not currently taking opioid prescriptions. Functional ability and status Nutritional status Physical activity Advanced directives List of other physicians Hospitalizations, surgeries, and ER visits in previous 12 months Vitals Screenings to include cognitive, depression, and falls Referrals and appointments  In addition, I have reviewed and discussed with patient certain preventive protocols, quality metrics, and best practice recommendations. A written personalized care plan for preventive services as well as general preventive health recommendations were provided to patient.     Dionisio David, LPN   [id number]   Nurse Notes: none

## 2022-06-29 ENCOUNTER — Ambulatory Visit (INDEPENDENT_AMBULATORY_CARE_PROVIDER_SITE_OTHER): Payer: PPO | Admitting: Family Medicine

## 2022-06-29 ENCOUNTER — Encounter: Payer: Self-pay | Admitting: Family Medicine

## 2022-06-29 VITALS — BP 136/74 | HR 73 | Temp 98.1°F | Ht 63.0 in | Wt 156.2 lb

## 2022-06-29 DIAGNOSIS — R2689 Other abnormalities of gait and mobility: Secondary | ICD-10-CM

## 2022-06-29 NOTE — Patient Instructions (Addendum)
[facility] [address] [place], [place] [address] Phone: [phone]

## 2022-06-29 NOTE — Progress Notes (Signed)
BP 136/74   Pulse 73   Temp 98.1 F (36.7 C) (Oral)   Ht 5\' 3"  (1.6 m)   Wt 156 lb 3.2 oz (70.9 kg)   LMP 05/13/1980   SpO2 97%   BMI 27.67 kg/m    Subjective:    Patient ID: Cassie Brewer, female    DOB: September 08, 1928, 89 y.o.   MRN: [id number]  HPI: Cassie Brewer is a 89 y.o. female  Chief Complaint  Patient presents with   Balance Issues   [name] presents today for follow up. She notes that generally she has been feeling a lot better. She notes that she would like to walk a bit more. She has been starting a walking program and would like to walk further. She notes that her balance has not been as good. She has not had any falls, but she feels like she is not as comfortable while she is walking for the past several months. She would like to do some PT. She is otherwise feeling well and has no other concerns or complaints at this time.   Relevant past medical, surgical, family and social history reviewed and updated as indicated. Interim medical history since our last visit reviewed. Allergies and medications reviewed and updated.  Review of Systems  Constitutional: Negative.   Respiratory: Negative.    Cardiovascular: Negative.   Musculoskeletal: Negative.   Skin:  Positive for rash. Negative for color change, pallor and wound.  Neurological: Negative.   Psychiatric/Behavioral: Negative.      Per HPI unless specifically indicated above     Objective:    BP 136/74   Pulse 73   Temp 98.1 F (36.7 C) (Oral)   Ht 5\' 3"  (1.6 m)   Wt 156 lb 3.2 oz (70.9 kg)   LMP 05/13/1980   SpO2 97%   BMI 27.67 kg/m   Wt Readings from Last 3 Encounters:  06/29/22 156 lb 3.2 oz (70.9 kg)  05/31/22 148 lb (67.1 kg)  03/30/22 148 lb 6.4 oz (67.3 kg)    Physical Exam Vitals and nursing note reviewed.  Constitutional:      General: She is not in acute distress.    Appearance: Normal appearance. She is not ill-appearing, toxic-appearing or diaphoretic.  HENT:     Head:  Normocephalic and atraumatic.     Right Ear: External ear normal.     Left Ear: External ear normal.     Nose: Nose normal.     Mouth/Throat:     Mouth: Mucous membranes are moist.     Pharynx: Oropharynx is clear.  Eyes:     General: No scleral icterus.       Right eye: No discharge.        Left eye: No discharge.     Extraocular Movements: Extraocular movements intact.     Conjunctiva/sclera: Conjunctivae normal.     Pupils: Pupils are equal, round, and reactive to light.  Cardiovascular:     Rate and Rhythm: Normal rate and regular rhythm.     Pulses: Normal pulses.     Heart sounds: Normal heart sounds. No murmur heard.    No friction rub. No gallop.  Pulmonary:     Effort: Pulmonary effort is normal. No respiratory distress.     Breath sounds: Normal breath sounds. No stridor. No wheezing, rhonchi or rales.  Chest:     Chest wall: No tenderness.  Musculoskeletal:        General: Normal range of motion.  Cervical back: Normal range of motion and neck supple.  Skin:    General: Skin is warm and dry.     Capillary Refill: Capillary refill takes less than 2 seconds.     Coloration: Skin is not jaundiced or pale.     Findings: No bruising, erythema, lesion or rash.  Neurological:     General: No focal deficit present.     Mental Status: She is alert and oriented to person, place, and time. Mental status is at baseline.  Psychiatric:        Mood and Affect: Mood normal.        Behavior: Behavior normal.        Thought Content: Thought content normal.        Judgment: Judgment normal.     Results for orders placed or performed in visit on 03/30/22  Microscopic Examination   Urine  Result Value Ref Range   WBC, UA 0-5 0 - 5 /hpf   RBC, Urine 0-2 0 - 2 /hpf   Epithelial Cells (non renal) 0-10 0 - 10 /hpf   Bacteria, UA None seen None seen/Few  CBC with Differential/Platelet  Result Value Ref Range   WBC 4.3 3.4 - 10.8 x10E3/uL   RBC 3.99 3.77 - 5.28 x10E6/uL    Hemoglobin 12.0 11.1 - 15.9 g/dL   Hematocrit 35.3 34.0 - 46.6 %   MCV 89 79 - 97 fL   MCH 30.1 26.6 - 33.0 pg   MCHC 34.0 31.5 - 35.7 g/dL   RDW 12.7 11.7 - 15.4 %   Platelets 373 150 - 450 x10E3/uL   Neutrophils 60 Not Estab. %   Lymphs 23 Not Estab. %   Monocytes 13 Not Estab. %   Eos 3 Not Estab. %   Basos 1 Not Estab. %   Neutrophils Absolute 2.6 1.4 - 7.0 x10E3/uL   Lymphocytes Absolute 1.0 0.7 - 3.1 x10E3/uL   Monocytes Absolute 0.6 0.1 - 0.9 x10E3/uL   EOS (ABSOLUTE) 0.1 0.0 - 0.4 x10E3/uL   Basophils Absolute 0.0 0.0 - 0.2 x10E3/uL   Immature Granulocytes 0 Not Estab. %   Immature Grans (Abs) 0.0 0.0 - 0.1 x10E3/uL  Comprehensive metabolic panel  Result Value Ref Range   Glucose 104 (H) 70 - 99 mg/dL   BUN 25 10 - 36 mg/dL   Creatinine, Ser 0.95 0.57 - 1.00 mg/dL   eGFR 57 (L) >59 mL/min/1.73   BUN/Creatinine Ratio 26 12 - 28   Sodium 135 134 - 144 mmol/L   Potassium 4.0 3.5 - 5.2 mmol/L   Chloride 98 96 - 106 mmol/L   CO2 25 20 - 29 mmol/L   Calcium 9.6 8.7 - 10.3 mg/dL   Total Protein 6.6 6.0 - 8.5 g/dL   Albumin 4.0 3.6 - 4.6 g/dL   Globulin, Total 2.6 1.5 - 4.5 g/dL   Albumin/Globulin Ratio 1.5 1.2 - 2.2   Bilirubin Total 0.6 0.0 - 1.2 mg/dL   Alkaline Phosphatase 63 44 - 121 IU/L   AST 13 0 - 40 IU/L   ALT 14 0 - 32 IU/L  Lipid Panel w/o Chol/HDL Ratio  Result Value Ref Range   Cholesterol, Total 158 100 - 199 mg/dL   Triglycerides 68 0 - 149 mg/dL   HDL 61 >39 mg/dL   VLDL Cholesterol Cal 13 5 - 40 mg/dL   LDL Chol Calc (NIH) 84 0 - 99 mg/dL  Urinalysis, Routine w reflex microscopic  Result Value Ref Range  Specific Gravity, UA 1.015 1.005 - 1.030   pH, UA 7.0 5.0 - 7.5   Color, UA Yellow Yellow   Appearance Ur Clear Clear   Leukocytes,UA 1+ (A) Negative   Protein,UA Negative Negative/Trace   Glucose, UA Negative Negative   Ketones, UA Negative Negative   RBC, UA Trace (A) Negative   Bilirubin, UA Negative Negative   Urobilinogen, Ur 1.0 0.2 -  1.0 mg/dL   Nitrite, UA Negative Negative   Microscopic Examination See below:   TSH  Result Value Ref Range   TSH 0.783 0.450 - 4.500 uIU/mL  Microalbumin, Urine Waived  Result Value Ref Range   Microalb, Ur Waived 10 0 - 19 mg/L   Creatinine, Urine Waived 50 10 - 300 mg/dL   Microalb/Creat Ratio 30-300 (H) <30 mg/g      Assessment & Plan:   Problem List Items Addressed This Visit   None Visit Diagnoses     Balance problem    -  Primary   Will get her set up for PT. Referral generated today. Call with any concerns. Continue to monitor.   Relevant Orders   Ambulatory referral to Physical Therapy        Follow up plan: Return in about 3 months (around 09/28/2022).

## 2022-07-02 DIAGNOSIS — L821 Other seborrheic keratosis: Secondary | ICD-10-CM | POA: Diagnosis not present

## 2022-07-02 DIAGNOSIS — R208 Other disturbances of skin sensation: Secondary | ICD-10-CM | POA: Diagnosis not present

## 2022-07-02 DIAGNOSIS — D2272 Melanocytic nevi of left lower limb, including hip: Secondary | ICD-10-CM | POA: Diagnosis not present

## 2022-07-02 DIAGNOSIS — D485 Neoplasm of uncertain behavior of skin: Secondary | ICD-10-CM | POA: Diagnosis not present

## 2022-07-02 DIAGNOSIS — D225 Melanocytic nevi of trunk: Secondary | ICD-10-CM | POA: Diagnosis not present

## 2022-07-02 DIAGNOSIS — D2271 Melanocytic nevi of right lower limb, including hip: Secondary | ICD-10-CM | POA: Diagnosis not present

## 2022-07-02 DIAGNOSIS — L820 Inflamed seborrheic keratosis: Secondary | ICD-10-CM | POA: Diagnosis not present

## 2022-07-02 DIAGNOSIS — D2261 Melanocytic nevi of right upper limb, including shoulder: Secondary | ICD-10-CM | POA: Diagnosis not present

## 2022-07-02 DIAGNOSIS — D2262 Melanocytic nevi of left upper limb, including shoulder: Secondary | ICD-10-CM | POA: Diagnosis not present

## 2022-07-02 DIAGNOSIS — L538 Other specified erythematous conditions: Secondary | ICD-10-CM | POA: Diagnosis not present

## 2022-07-06 ENCOUNTER — Encounter: Payer: Self-pay | Admitting: Family Medicine

## 2022-07-06 ENCOUNTER — Ambulatory Visit
Admission: RE | Admit: 2022-07-06 | Discharge: 2022-07-06 | Disposition: A | Discharge status: Home / Self Care | Payer: PPO | Source: Ambulatory Visit | Attending: Family Medicine | Admitting: Family Medicine

## 2022-07-06 DIAGNOSIS — Z13820 Encounter for screening for osteoporosis: Secondary | ICD-10-CM | POA: Diagnosis not present

## 2022-07-06 DIAGNOSIS — Z780 Asymptomatic menopausal state: Secondary | ICD-10-CM | POA: Diagnosis not present

## 2022-07-06 DIAGNOSIS — Z1231 Encounter for screening mammogram for malignant neoplasm of breast: Secondary | ICD-10-CM | POA: Insufficient documentation

## 2022-07-07 ENCOUNTER — Other Ambulatory Visit: Payer: Self-pay | Admitting: Family Medicine

## 2022-07-07 DIAGNOSIS — R921 Mammographic calcification found on diagnostic imaging of breast: Secondary | ICD-10-CM

## 2022-07-07 DIAGNOSIS — R928 Other abnormal and inconclusive findings on diagnostic imaging of breast: Secondary | ICD-10-CM

## 2022-07-13 ENCOUNTER — Ambulatory Visit: Payer: PPO | Admitting: Podiatry

## 2022-07-13 DIAGNOSIS — R2681 Unsteadiness on feet: Secondary | ICD-10-CM | POA: Diagnosis not present

## 2022-07-14 ENCOUNTER — Ambulatory Visit
Admission: RE | Admit: 2022-07-14 | Discharge: 2022-07-14 | Disposition: A | Discharge status: Home / Self Care | Payer: PPO | Source: Ambulatory Visit | Attending: Family Medicine | Admitting: Family Medicine

## 2022-07-14 ENCOUNTER — Other Ambulatory Visit: Payer: Self-pay | Admitting: Family Medicine

## 2022-07-14 DIAGNOSIS — R928 Other abnormal and inconclusive findings on diagnostic imaging of breast: Secondary | ICD-10-CM | POA: Diagnosis not present

## 2022-07-14 DIAGNOSIS — R921 Mammographic calcification found on diagnostic imaging of breast: Secondary | ICD-10-CM

## 2022-07-16 ENCOUNTER — Ambulatory Visit: Payer: PPO | Admitting: Podiatry

## 2022-07-16 DIAGNOSIS — B351 Tinea unguium: Secondary | ICD-10-CM | POA: Diagnosis not present

## 2022-07-16 DIAGNOSIS — M79675 Pain in left toe(s): Secondary | ICD-10-CM | POA: Diagnosis not present

## 2022-07-16 DIAGNOSIS — M79674 Pain in right toe(s): Secondary | ICD-10-CM

## 2022-07-16 NOTE — Progress Notes (Signed)
  Subjective:  Patient ID: Cassie Brewer, female    DOB: 09/28/1928,  MRN: [id number]  Chief Complaint  Patient presents with   Nail Problem   89 y.o. female returns for the above complaint.  Patient presents with thickened elongated mycotic toenails x10.  Patient is especially concerned for right hallux onychomycosis.  Patient not able to debride on herself.  She would like for us to debride down.  She does not have any secondary complaints. Objective:   There were no vitals filed for this visit. Podiatric Exam: Vascular: dorsalis pedis and posterior tibial pulses are palpable bilateral. Capillary return is immediate. Temperature gradient is WNL. Skin turgor WNL  Sensorium: Normal Semmes Weinstein monofilament test. Normal tactile sensation bilaterally.  However she experiences subjective neuropathic pain to the distal tip of the toes.  Intact sensation Nail Exam: Pt has thick disfigured discolored nails with subungual debris noted bilateral entire nail hallux through fifth toenails.  Ingrown noted to the right hallux lateral border.  Mild pain on palpation. Ulcer Exam: There is no evidence of ulcer or pre-ulcerative changes or infection. Orthopedic Exam: Muscle tone and strength are WNL. No limitations in general ROM. No crepitus or effusions noted. HAV  B/L.  Hammer toes 2-5  B/L. Skin: No Porokeratosis. No infection or ulcers.  Xerosis/psoriasis of the plantar foot with now subjective component of itching..  There is mild cracking noted.  No bleeding noted.  Assessment & Plan:  Patient was evaluated and treated and all questions answered.  Athlete's foot bilateral -Explained to patient the etiology of athlete's foot and various treatment options were discussed.  Given that there is subjective complaint of itching associated with the bottom of the foot I believe she will benefit from Lotrisone cream.  I have asked her to apply twice a day.  She states understanding.  Neuropathic  pain -doing well  Xerosis/psoriasis -I explained to the patient the etiology of dryness with cracking as well as various treatment options associated with it.  Given that she is doing good with clotrimazole cream that was given to her by dermatologist and had diagnosed her with psoriasis, I believe she can continue using the cream.  Patient agrees with this plan.  Onychomycosis with pain  -Nails palliatively debrided as below. -Educated on self-care  Procedure: Nail Debridement Rationale: pain  Type of Debridement: manual, sharp debridement. Instrumentation: Nail nipper, rotary burr. Number of Nails: 10  Procedures and Treatment: Consent by patient was obtained for treatment procedures. The patient understood the discussion of treatment and procedures well. All questions were answered thoroughly reviewed. Debridement of mycotic and hypertrophic toenails, 1 through 5 bilateral and clearing of subungual debris. No ulceration, no infection noted.  Return Visit-Office Procedure: Patient instructed to return to the office for a follow up visit 3 months for continued evaluation and treatment.  Travarius Lange, DPM    No follow-ups on file. 

## 2022-07-20 DIAGNOSIS — R2681 Unsteadiness on feet: Secondary | ICD-10-CM | POA: Diagnosis not present

## 2022-07-23 DIAGNOSIS — R2681 Unsteadiness on feet: Secondary | ICD-10-CM | POA: Diagnosis not present

## 2022-07-27 DIAGNOSIS — R2681 Unsteadiness on feet: Secondary | ICD-10-CM | POA: Diagnosis not present

## 2022-07-30 DIAGNOSIS — R2681 Unsteadiness on feet: Secondary | ICD-10-CM | POA: Diagnosis not present

## 2022-08-03 DIAGNOSIS — R2681 Unsteadiness on feet: Secondary | ICD-10-CM | POA: Diagnosis not present

## 2022-08-04 ENCOUNTER — Ambulatory Visit
Admission: RE | Admit: 2022-08-04 | Discharge: 2022-08-04 | Disposition: A | Discharge status: Home / Self Care | Payer: PPO | Source: Ambulatory Visit | Attending: Family Medicine | Admitting: Family Medicine

## 2022-08-04 DIAGNOSIS — R921 Mammographic calcification found on diagnostic imaging of breast: Secondary | ICD-10-CM | POA: Insufficient documentation

## 2022-08-04 DIAGNOSIS — R928 Other abnormal and inconclusive findings on diagnostic imaging of breast: Secondary | ICD-10-CM | POA: Insufficient documentation

## 2022-08-04 DIAGNOSIS — D0511 Intraductal carcinoma in situ of right breast: Secondary | ICD-10-CM | POA: Diagnosis not present

## 2022-08-04 HISTORY — PX: BREAST BIOPSY: SHX20

## 2022-08-04 MED ORDER — LIDOCAINE-EPINEPHRINE 1 %-1:100000 IJ SOLN
10.0000 mL | Freq: Once | INTRAMUSCULAR | Status: AC
  Administered 2022-08-04: 10 mL
  Filled 2022-08-04: qty 10

## 2022-08-04 MED ORDER — LIDOCAINE HCL (PF) 1 % IJ SOLN
15.0000 mL | Freq: Once | INTRAMUSCULAR | Status: AC
  Administered 2022-08-04: 15 mL
  Filled 2022-08-04: qty 15

## 2022-08-05 DIAGNOSIS — R2681 Unsteadiness on feet: Secondary | ICD-10-CM | POA: Diagnosis not present

## 2022-08-06 ENCOUNTER — Encounter: Payer: Self-pay | Admitting: *Deleted

## 2022-08-06 LAB — SURGICAL PATHOLOGY

## 2022-08-06 NOTE — Progress Notes (Signed)
Received referral for newly diagnosed breast cancer from [facility].  Navigation initiated.  I will call Cassie Brewer back on Monday morning to discuss who she would like to see for surgeon and medical oncology.

## 2022-08-09 ENCOUNTER — Encounter: Payer: Self-pay | Admitting: *Deleted

## 2022-08-09 DIAGNOSIS — D0511 Intraductal carcinoma in situ of right breast: Secondary | ICD-10-CM

## 2022-08-09 NOTE — Progress Notes (Signed)
Ms. Skipwith would like to see [name] and Dr. Claudine Mouton.   Referral placed to [facility], she will see [name] tomorrow at 2:30.

## 2022-08-10 ENCOUNTER — Inpatient Hospital Stay: Payer: PPO

## 2022-08-10 ENCOUNTER — Encounter: Payer: Self-pay | Admitting: *Deleted

## 2022-08-10 ENCOUNTER — Inpatient Hospital Stay: Payer: PPO | Attending: Oncology | Admitting: Oncology

## 2022-08-10 ENCOUNTER — Encounter: Payer: Self-pay | Admitting: Oncology

## 2022-08-10 VITALS — BP 142/62 | HR 73 | Temp 96.7°F | Ht 63.0 in | Wt 153.0 lb

## 2022-08-10 DIAGNOSIS — Z7189 Other specified counseling: Secondary | ICD-10-CM

## 2022-08-10 DIAGNOSIS — N183 Chronic kidney disease, stage 3 unspecified: Secondary | ICD-10-CM | POA: Diagnosis not present

## 2022-08-10 DIAGNOSIS — I129 Hypertensive chronic kidney disease with stage 1 through stage 4 chronic kidney disease, or unspecified chronic kidney disease: Secondary | ICD-10-CM | POA: Insufficient documentation

## 2022-08-10 DIAGNOSIS — D0511 Intraductal carcinoma in situ of right breast: Secondary | ICD-10-CM | POA: Diagnosis not present

## 2022-08-10 DIAGNOSIS — E039 Hypothyroidism, unspecified: Secondary | ICD-10-CM | POA: Diagnosis not present

## 2022-08-10 NOTE — Progress Notes (Signed)
Accompanied patient to initial medical oncology appointment.   Reviewed Breast Cancer treatment handbook.   Care plan summary given to patient.   Reviewed outreach programs and cancer center services.   

## 2022-08-11 DIAGNOSIS — D0511 Intraductal carcinoma in situ of right breast: Secondary | ICD-10-CM | POA: Insufficient documentation

## 2022-08-11 NOTE — H&P (View-Only) (Signed)
Is the patient ID: Cassie Brewer, female   DOB: [birth year], 89 y.o.   MRN: [id number]  Chief Complaint: DCIS right breast  History of Present Illness Cassie Brewer is a 89 y.o. female with microcalcifications noted on screening mammogram, stereotactic biopsy of the right breast revealed DCIS with comedonecrosis.  She had prior breast surgery when she was 32, removing breast cysts.  She had postmenopausal hormone therapy, she had a partial hysterectomy.  She has a granddaughter in her 4s who has breast cancer.  She began menstruating at the age of 77.  She is gravida 2 para 2.  She was 89 years old with her first pregnancy.  As far as breast skin changes, palpable lumps, or nipple discharge or pain she has had none.  She reports that she had a friend who went through a screening mammogram and decided to do the same.  Past Medical History Past Medical History:  Diagnosis Date   Glaucoma    Hyperlipidemia    Hypertension    Hypothyroidism 10/02/2014   Overactive bladder    Squamous cell skin cancer, face    Stroke (HCC) 2021   speech stroke      Past Surgical History:  Procedure Laterality Date   BREAST BIOPSY Right 08/04/2022   Right Breast Stereo, Ribbon clip- path pending   BREAST BIOPSY Right 08/04/2022   MM RT BREAST BX W LOC DEV 1ST LESION IMAGE BX SPEC STEREO GUIDE 08/04/2022 [facility]   BREAST SURGERY     SURGICAL EXCISION OF EXCESSIVE SKIN Bilateral    x4 1960's and 1970's   THYROIDECTOMY, PARTIAL Right    TOTAL ABDOMINAL HYSTERECTOMY      Allergies  Allergen Reactions   Amoxicillin     Other reaction(s): Other (See Comments) "blood pressure very high - almost passed out"   Eryc [Erythromycin] Other (See Comments)   Sulfa Antibiotics Nausea And Vomiting    Nausea with bactrim in 2017  Nausea with bactrim in 2017     Current Outpatient Medications  Medication Sig Dispense Refill   aspirin EC 81 MG tablet Take 1 tablet (81 mg total) by mouth daily. Swallow  whole. 100 tablet 12   atorvastatin (LIPITOR) 20 MG tablet Take 1 tablet (20 mg total) by mouth daily. 90 tablet 1   benazepril (LOTENSIN) 40 MG tablet Take 1 tablet (40 mg total) by mouth daily. 90 tablet 1   Calcium Carbonate-Vit D-Min (CALCIUM 600+D PLUS MINERALS) 600-400 MG-UNIT TABS Take 1 tablet by mouth as directed.     clotrimazole-betamethasone (LOTRISONE) cream Apply 1 application topically 2 (two) times daily. (Patient taking differently: Apply 1 application  topically as needed.) 30 g 0   Desoximetasone (TOPICORT) 0.25 % ointment SMARTSIG:sparingly Topical Twice Daily     hydrochlorothiazide (HYDRODIURIL) 25 MG tablet Take 1 tablet (25 mg total) by mouth daily. 90 tablet 1   hydrocortisone (ANUSOL-HC) 2.5 % rectal cream Place 1 application  rectally 2 (two) times daily. (Patient taking differently: Place 1 application  rectally as needed.) 30 g 12   latanoprost (XALATAN) 0.005 % ophthalmic solution Place 1 drop into both eyes at bedtime.  6   levothyroxine (SYNTHROID) 112 MCG tablet Take 1 tablet (112 mcg total) by mouth daily before breakfast. 90 tablet 3   Multiple Vitamins-Minerals (CENTRUM SILVER ULTRA WOMENS PO) Take 1 tablet by mouth daily.     Omega-3 Fatty Acids (FISH OIL) 1000 MG CAPS Take 1,000 mg by mouth daily.     polyethylene glycol  powder (GLYCOLAX/MIRALAX) 17 GM/SCOOP powder Take 17 g by mouth 2 (two) times daily as needed. 3350 g 1   timolol (TIMOPTIC) 0.5 % ophthalmic solution Place 1 drop into the left eye daily.     vitamin C (ASCORBIC ACID) 500 MG tablet Take 500 mg by mouth daily.     No current facility-administered medications for this visit.    Family History Family History  Problem Relation Age of Onset   Hypertension Mother    Diabetes Father    Colon cancer Father    Breast cancer Grandchild       Social History Social History   Tobacco Use   Smoking status: Never    Passive exposure: Never   Smokeless tobacco: Never  Vaping Use   Vaping  Use: Never used  Substance Use Topics   Alcohol use: No   Drug use: No        Review of Systems  Constitutional:  Positive for malaise/fatigue.  HENT:  Positive for hearing loss.   Eyes: Negative.   Respiratory: Negative.    Cardiovascular:  Positive for leg swelling.  Gastrointestinal: Negative.   Genitourinary:  Positive for frequency and urgency.  Skin:  Positive for itching.  Neurological:  Positive for headaches.  Psychiatric/Behavioral: Negative.       Physical Exam Blood pressure (!) 150/83, pulse 88, temperature 98 F (36.7 C), height 5\' 3"  (1.6 m), weight 152 lb (68.9 kg), last menstrual period 05/13/1980, SpO2 97 %. Last Weight  Most recent update: 08/12/2022  3:46 PM    Weight  68.9 kg (152 lb)             CONSTITUTIONAL: Well developed, and nourished, appropriately responsive and aware without distress.   EYES: Sclera non-icteric.   EARS, NOSE, MOUTH AND THROAT:  The oropharynx is clear. Oral mucosa is pink and moist.   Hearing is intact to voice.  NECK: Trachea is midline, and there is no jugular venous distension.  LYMPH NODES:  Lymph nodes in the neck are not appreciated. RESPIRATORY:  Lungs are clear, and breath sounds are equal bilaterally.  Normal respiratory effort without pathologic use of accessory muscles. CARDIOVASCULAR: Heart is regular in rate and rhythm.   Well perfused.  GI: The abdomen is  soft, nontender, and nondistended. MUSCULOSKELETAL:  Symmetrical muscle tone appreciated in all four extremities.    SKIN: Skin turgor is normal. No pathologic skin lesions appreciated.  NEUROLOGIC:  Motor and sensation appear grossly normal.  Cranial nerves are grossly without defect. PSYCH:  Alert and oriented to person, place and time. Affect is appropriate for situation.  Data Reviewed I have personally reviewed what is currently available of the patient's imaging, recent labs and medical records.   Labs:     Latest Ref Rng & Units 03/30/2022    9:32  AM 09/07/2021    9:06 AM 03/06/2021   10:10 AM  CBC  WBC 3.4 - 10.8 x10E3/uL 4.3  5.3  5.2   Hemoglobin 11.1 - 15.9 g/dL 16.1  09.6  04.5   Hematocrit 34.0 - 46.6 % 35.3  34.4  34.8   Platelets 150 - 450 x10E3/uL 373  305  320       Latest Ref Rng & Units 03/30/2022    9:32 AM 09/07/2021    9:06 AM 03/06/2021   10:10 AM  CMP  Glucose 70 - 99 mg/dL 409  811  914   BUN 10 - 36 mg/dL 25  19  23  Creatinine 0.57 - 1.00 mg/dL 4.09  8.11  9.14   Sodium 134 - 144 mmol/L 135  138  140   Potassium 3.5 - 5.2 mmol/L 4.0  4.1  4.0   Chloride 96 - 106 mmol/L 98  100  101   CO2 20 - 29 mmol/L 25  23  25    Calcium 8.7 - 10.3 mg/dL 9.6  9.4  9.6   Total Protein 6.0 - 8.5 g/dL 6.6  6.7  6.9   Total Bilirubin 0.0 - 1.2 mg/dL 0.6  0.4  0.5   Alkaline Phos 44 - 121 IU/L 63  63  63   AST 0 - 40 IU/L 13  18  18    ALT 0 - 32 IU/L 14  14  14     SURGICAL PATHOLOGY  CASE: [id number]  PATIENT: [name]  Surgical Pathology Report   Specimen Submitted:  A. Breast, right, upper outer quadrant   Clinical History: Screen detected CALC.  Fibrocystic tissue vs FA vs  malignancy. Ribbon-shaped clip placed following stereotactic biopsy of  RIGHT breast, upper outer quadrant.   DIAGNOSIS:  A. BREAST, RIGHT UPPER OUTER QUADRANT; STEREOTACTIC CORE NEEDLE BIOPSY:  - DUCTAL CARCINOMA IN SITU, HIGH-GRADE, WITH COMEDONECROSIS AND  CALCIFICATIONS.  - NO EVIDENCE OF INVASIVE CARCINOMA.   Comment:  DCIS is present in 4 of 4 tissue blocks, spanning up to 3 mm in greatest  linear extent.  Ancillary estrogen receptor testing will be deferred to  an excision specimen, but may be performed at request.  Within last 24 hrs: No results found.   Imaging: Radiological images reviewed:   CLINICAL DATA:  Callback for RIGHT breast calcifications. There remote history of an excisional biopsy   EXAM: DIGITAL DIAGNOSTIC UNILATERAL RIGHT MAMMOGRAM WITH TOMOSYNTHESIS   TECHNIQUE: Right digital diagnostic  mammography and breast tomosynthesis was performed.   COMPARISON:  Previous exam(s).   ACR Breast Density Category c: The breasts are heterogeneously dense, which may obscure small masses.   FINDINGS: Spot magnification views of the RIGHT breast demonstrate a 6 mm group of coarse and punctate calcifications in the RIGHT upper breast at anterior to middle depth. These are not stable in comparison to prior mammogram from 2016. There are 2 punctate calcifications in the RIGHT upper outer breast at anterior to middle depth which are stable dating back to 2016 and consistent with a benign etiology. No additional suspicious findings are noted.   IMPRESSION: A 6 mm group of calcifications in the RIGHT upper breast at anterior to middle depth are indeterminate. Recommend stereotactic guided biopsy for definitive characterization.   RECOMMENDATION: RIGHT breast stereotactic guided biopsy x1   I have discussed the findings and recommendations with the patient. The biopsy procedure was discussed with the patient and questions were answered. Patient expressed their understanding of the biopsy recommendation. Patient will be scheduled for biopsy at her earliest convenience by the schedulers. Ordering provider will be notified. If applicable, a reminder letter will be sent to the patient regarding the next appointment.   BI-RADS CATEGORY  4: Suspicious.     Electronically Signed   By: Meda Klinefelter M.D.   On: 07/14/2022 08:57 Assessment    Patient Active Problem List   Diagnosis Date Noted   Ductal carcinoma in situ (DCIS) of right breast 08/11/2022   Senile purpura (HCC) 03/30/2022   Constipation 03/06/2021   Aphasia 03/21/2020   Aphasia S/P CVA 03/11/2020   TIA (transient ischemic attack) 03/10/2020   Arthritis of foot 04/24/2015  Benign hypertensive renal disease 10/02/2014   Hypothyroidism 10/02/2014   Hyperlipidemia 10/02/2014   CKD (chronic kidney disease), stage  III (HCC) 10/02/2014    Plan    RFID tag localized right breast lumpectomy. I discussed the available options with the patient. I also discussed that given the small size of the cancer would recommend localization lumpectomy +/- radiation to follow, or just endocrine blocker.  I also discussed that we would not need to do a sentinel lymph node biopsy.  Explained to the patient that after her surgical treatment additional treatment will depend on her prognostic indicators and stage.    I discussed risks of bleeding, infection, damage to surrounding tissues, having positive margins, needing further resection, damage to nerves causing arm numbness or difficulty raising arm, causing lymphedema in the arm; as well as anesthesia risks of MI, stroke, prolonged ventilation, pulmonary embolism, thrombosis and even death.   Patient was given the opportunity to ask questions and have them answered.  They would like to proceed with right breast RFID localized lumpectomy.  Face-to-face time spent with the patient and accompanying care providers(if present) was 20 minutes, with more than 50% of the time spent counseling, educating, and coordinating care of the patient.    These notes generated with voice recognition software. I apologize for typographical errors.  Campbell Lerner M.D., FACS 08/12/2022, 4:24 PM

## 2022-08-11 NOTE — Progress Notes (Unsigned)
Patient ID: Cassie Brewer, female   DOB: 1928-11-26, 89 y.o.   MRN: [id number]  Chief Complaint:  ***  History of Present Illness Cassie Brewer is a 89 y.o. female with ***.  Past Medical History Past Medical History:  Diagnosis Date   Glaucoma    Hyperlipidemia    Hypertension    Hypothyroidism 10/02/2014   Overactive bladder    Squamous cell skin cancer, face       Past Surgical History:  Procedure Laterality Date   BREAST BIOPSY Right 08/04/2022   Right Breast Stereo, Ribbon clip- path pending   BREAST BIOPSY Right 08/04/2022   MM RT BREAST BX W LOC DEV 1ST LESION IMAGE BX SPEC STEREO GUIDE 08/04/2022 [facility]   BREAST SURGERY     SURGICAL EXCISION OF EXCESSIVE SKIN Bilateral    x4 1960's and 1970's   THYROIDECTOMY, PARTIAL Right    TOTAL ABDOMINAL HYSTERECTOMY      Allergies  Allergen Reactions   Amoxicillin     Other reaction(s): Other (See Comments) "blood pressure very high - almost passed out"   Eryc [Erythromycin] Other (See Comments)   Sulfa Antibiotics Nausea And Vomiting    Nausea with bactrim in 2017  Nausea with bactrim in 2017     Current Outpatient Medications  Medication Sig Dispense Refill   aspirin EC 81 MG tablet Take 1 tablet (81 mg total) by mouth daily. Swallow whole. 100 tablet 12   atorvastatin (LIPITOR) 20 MG tablet Take 1 tablet (20 mg total) by mouth daily. 90 tablet 1   benazepril (LOTENSIN) 40 MG tablet Take 1 tablet (40 mg total) by mouth daily. 90 tablet 1   Calcium Carbonate-Vit D-Min (CALCIUM 600+D PLUS MINERALS) 600-400 MG-UNIT TABS Take 1 tablet by mouth as directed.     clotrimazole-betamethasone (LOTRISONE) cream Apply 1 application topically 2 (two) times daily. 30 g 0   Desoximetasone (TOPICORT) 0.25 % ointment SMARTSIG:sparingly Topical Twice Daily     hydrochlorothiazide (HYDRODIURIL) 25 MG tablet Take 1 tablet (25 mg total) by mouth daily. 90 tablet 1   hydrocortisone (ANUSOL-HC) 2.5 % rectal cream Place 1 application   rectally 2 (two) times daily. 30 g 12   latanoprost (XALATAN) 0.005 % ophthalmic solution Place 1 drop into both eyes at bedtime.  6   levothyroxine (SYNTHROID) 112 MCG tablet Take 1 tablet (112 mcg total) by mouth daily before breakfast. 90 tablet 3   Multiple Vitamins-Minerals (CENTRUM SILVER ULTRA WOMENS PO) Take 1 tablet by mouth daily.     Omega-3 Fatty Acids (FISH OIL) 1000 MG CAPS Take 1,000 mg by mouth daily.     polyethylene glycol powder (GLYCOLAX/MIRALAX) 17 GM/SCOOP powder Take 17 g by mouth 2 (two) times daily as needed. 3350 g 1   timolol (TIMOPTIC) 0.5 % ophthalmic solution Place 1 drop into the left eye daily.     vitamin C (ASCORBIC ACID) 500 MG tablet Take 500 mg by mouth daily.     No current facility-administered medications for this visit.    Family History Family History  Problem Relation Age of Onset   Hypertension Mother    Diabetes Father    Colon cancer Father    Breast cancer Grandchild       Social History Social History   Tobacco Use   Smoking status: Never   Smokeless tobacco: Never  Vaping Use   Vaping Use: Never used  Substance Use Topics   Alcohol use: No   Drug use: No  ROS   Physical Exam Last menstrual period 05/13/1980.   CONSTITUTIONAL: Well developed, and nourished, appropriately responsive and aware without distress. ***  EYES: Sclera non-icteric.   EARS, NOSE, MOUTH AND THROAT: Mask worn.  *** The oropharynx is clear. Oral mucosa is pink and moist.  Dentition: ***   Hearing is intact to voice.  NECK: Trachea is midline, and there is no jugular venous distension.  LYMPH NODES:  Lymph nodes in the neck are not appreciated. RESPIRATORY:  Lungs are clear, and breath sounds are equal bilaterally. *** Normal respiratory effort without pathologic use of accessory muscles. CARDIOVASCULAR: Heart is regular in rate and rhythm.  *** Well perfused.  GI: The abdomen is *** soft, nontender, and nondistended. There were no palpable  masses. *** I did not appreciate hepatosplenomegaly. There were normal bowel sounds.  *** GU: *** MUSCULOSKELETAL:  Symmetrical muscle tone appreciated in all four extremities.    SKIN: Skin turgor is normal. No pathologic skin lesions appreciated.  NEUROLOGIC:  Motor and sensation appear grossly normal.  Cranial nerves are grossly without defect. PSYCH:  Alert and oriented to person, place and time. Affect is appropriate for situation.  Data Reviewed I have personally reviewed what is currently available of the patient's imaging, recent labs and medical records.   Labs:     Latest Ref Rng & Units 03/30/2022    9:32 AM 09/07/2021    9:06 AM 03/06/2021   10:10 AM  CBC  WBC 3.4 - 10.8 x10E3/uL 4.3  5.3  5.2   Hemoglobin 11.1 - 15.9 g/dL 16.1  09.6  04.5   Hematocrit 34.0 - 46.6 % 35.3  34.4  34.8   Platelets 150 - 450 x10E3/uL 373  305  320       Latest Ref Rng & Units 03/30/2022    9:32 AM 09/07/2021    9:06 AM 03/06/2021   10:10 AM  CMP  Glucose 70 - 99 mg/dL 409  811  914   BUN 10 - 36 mg/dL 25  19  23    Creatinine 0.57 - 1.00 mg/dL 7.82  9.56  2.13   Sodium 134 - 144 mmol/L 135  138  140   Potassium 3.5 - 5.2 mmol/L 4.0  4.1  4.0   Chloride 96 - 106 mmol/L 98  100  101   CO2 20 - 29 mmol/L 25  23  25    Calcium 8.7 - 10.3 mg/dL 9.6  9.4  9.6   Total Protein 6.0 - 8.5 g/dL 6.6  6.7  6.9   Total Bilirubin 0.0 - 1.2 mg/dL 0.6  0.4  0.5   Alkaline Phos 44 - 121 IU/L 63  63  63   AST 0 - 40 IU/L 13  18  18    ALT 0 - 32 IU/L 14  14  14     *** {Labs :18171}    SURGICAL PATHOLOGY  CASE: [id number]  PATIENT: Cassie Brewer  Surgical Pathology Report   Specimen Submitted:  A. Breast, right, upper outer quadrant   Clinical History: Screen detected CALC.  Fibrocystic tissue vs FA vs  malignancy. Ribbon-shaped clip placed following stereotactic biopsy of  RIGHT breast, upper outer quadrant.   DIAGNOSIS:  A. BREAST, RIGHT UPPER OUTER QUADRANT; STEREOTACTIC CORE NEEDLE BIOPSY:   - DUCTAL CARCINOMA IN SITU, HIGH-GRADE, WITH COMEDONECROSIS AND  CALCIFICATIONS.  - NO EVIDENCE OF INVASIVE CARCINOMA.   Comment:  DCIS is present in 4 of 4 tissue blocks, spanning up to 3 mm in  greatest  linear extent.  Ancillary estrogen receptor testing will be deferred to  an excision specimen, but may be performed at request.  Within last 24 hrs: No results found.   Imaging: Radiological images reviewed:   CLINICAL DATA:  Callback for RIGHT breast calcifications. There remote history of an excisional biopsy   EXAM: DIGITAL DIAGNOSTIC UNILATERAL RIGHT MAMMOGRAM WITH TOMOSYNTHESIS   TECHNIQUE: Right digital diagnostic mammography and breast tomosynthesis was performed.   COMPARISON:  Previous exam(s).   ACR Breast Density Category c: The breasts are heterogeneously dense, which may obscure small masses.   FINDINGS: Spot magnification views of the RIGHT breast demonstrate a 6 mm group of coarse and punctate calcifications in the RIGHT upper breast at anterior to middle depth. These are not stable in comparison to prior mammogram from 2016. There are 2 punctate calcifications in the RIGHT upper outer breast at anterior to middle depth which are stable dating back to 2016 and consistent with a benign etiology. No additional suspicious findings are noted.   IMPRESSION: A 6 mm group of calcifications in the RIGHT upper breast at anterior to middle depth are indeterminate. Recommend stereotactic guided biopsy for definitive characterization.   RECOMMENDATION: RIGHT breast stereotactic guided biopsy x1   I have discussed the findings and recommendations with the patient. The biopsy procedure was discussed with the patient and questions were answered. Patient expressed their understanding of the biopsy recommendation. Patient will be scheduled for biopsy at her earliest convenience by the schedulers. Ordering provider will be notified. If applicable, a reminder letter  will be sent to the patient regarding the next appointment.   BI-RADS CATEGORY  4: Suspicious.     Electronically Signed   By: Meda Klinefelter M.D.   On: 07/14/2022 08:57 Assessment    *** Patient Active Problem List   Diagnosis Date Noted   Senile purpura (HCC) 03/30/2022   Constipation 03/06/2021   Aphasia 03/21/2020   Aphasia S/P CVA 03/11/2020   TIA (transient ischemic attack) 03/10/2020   Arthritis of foot 04/24/2015   Benign hypertensive renal disease 10/02/2014   Hypothyroidism 10/02/2014   Hyperlipidemia 10/02/2014   CKD (chronic kidney disease), stage III (HCC) 10/02/2014    Plan    ***  Face-to-face time spent with the patient and accompanying care providers(if present) was *** minutes, with more than 50% of the time spent counseling, educating, and coordinating care of the patient.    These notes generated with voice recognition software. I apologize for typographical errors.  Campbell Lerner M.D., FACS 08/11/2022, 3:02 PM

## 2022-08-11 NOTE — Progress Notes (Signed)
Hematology/Oncology Consult note [facility] Telephone:([phone] [phone]  Patient Care Team: Cassie Carrow, DO as PCP - General (Family Medicine) Cassie Alar, MD (Dermatology) Cassie Brewer, [place] (Podiatry) Cassie Brewer, DPM as Referring Physician (Podiatry)   Name of the patient: Cassie Brewer  [id number]  Oct 19, 1928    Reason for referral-right breast DCIS   Referring physician-Dr. Olevia Brewer  Date of visit: 08/11/22   History of presenting illness-patient is a 89 year old female with a past medical history significant for hypertension hypothyroidism, stage III CKD who wanted to undergo a screening mammogram since it has been a while since she got the previous one.  Mammogram showed possible calcifications in the right breast.  This was followed by diagnostic mammogramWhich showed 6 mm group of calcifications in the right upper breast.  This was biopsied and was consistent with DCIS high-grade with comedonecrosis and calcifications.  Patient will be seen by Dr. Claudine Brewer to discuss about surgery.  She has not had any prior breast biopsies or breast cancer.  ECOG PS- 1  Pain scale- 0   Review of systems- Review of Systems  Constitutional:  Negative for chills, fever, malaise/fatigue and weight loss.  HENT:  Negative for congestion, ear discharge and nosebleeds.   Eyes:  Negative for blurred vision.  Respiratory:  Negative for cough, hemoptysis, sputum production, shortness of breath and wheezing.   Cardiovascular:  Negative for chest pain, palpitations, orthopnea and claudication.  Gastrointestinal:  Negative for abdominal pain, blood in stool, constipation, diarrhea, heartburn, melena, nausea and vomiting.  Genitourinary:  Negative for dysuria, flank pain, frequency, hematuria and urgency.  Musculoskeletal:  Negative for back pain, joint pain and myalgias.  Skin:  Negative for rash.  Neurological:  Negative for dizziness,  tingling, focal weakness, seizures, weakness and headaches.  Endo/Heme/Allergies:  Does not bruise/bleed easily.  Psychiatric/Behavioral:  Negative for depression and suicidal ideas. The patient does not have insomnia.     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/02/2014   Hypothyroidism 10/02/2014   Hyperlipidemia 10/02/2014   CKD (chronic kidney disease), stage III (HCC) 10/02/2014     Past Medical History:  Diagnosis Date   Glaucoma    Hyperlipidemia    Hypertension    Hypothyroidism 10/02/2014   Overactive bladder    Squamous cell skin cancer, face      Past Surgical History:  Procedure Laterality Date   BREAST BIOPSY Right 08/04/2022   Right Breast Stereo, Ribbon clip- path pending   BREAST BIOPSY Right 08/04/2022   MM RT BREAST BX W LOC DEV 1ST LESION IMAGE BX SPEC STEREO GUIDE 08/04/2022 [facility]   BREAST SURGERY     SURGICAL EXCISION OF EXCESSIVE SKIN Bilateral    x4 1960's and 1970's   THYROIDECTOMY, PARTIAL Right    TOTAL ABDOMINAL HYSTERECTOMY      Social History   Socioeconomic History   Marital status: Widowed    Spouse  name: Not on file   Number of children: Not on file   Years of education: Not on file   Highest education level: Associate degree: occupational, Scientist, product/process development, or vocational program  Occupational History   Not on file  Tobacco Use   Smoking status: Never   Smokeless tobacco: Never  Vaping Use   Vaping Use: Never  used  Substance and Sexual Activity   Alcohol use: No   Drug use: No   Sexual activity: Not Currently    Birth control/protection: Surgical  Other Topics Concern   Not on file  Social History Narrative   Not on file   Social Determinants of Health   Financial Resource Strain: Low Risk  (06/28/2022)   Overall Financial Resource Strain (CARDIA)    Difficulty of Paying Living Expenses: Not hard at all  Food Insecurity: No Food Insecurity (08/10/2022)   Hunger Vital Sign    Worried About Running Out of Food in the Last Year: Never true    Ran Out of Food in the Last Year: Never true  Transportation Needs: No Transportation Needs (08/10/2022)   PRAPARE - Administrator, Civil Service (Medical): No    Lack of Transportation (Non-Medical): No  Physical Activity: Insufficiently Active (06/28/2022)   Exercise Vital Sign    Days of Exercise per Week: 3 days    Minutes of Exercise per Session: 30 min  Stress: No Stress Concern Present (06/28/2022)   [employer] of Occupational Health - Occupational Stress Questionnaire    Feeling of Stress : Only a little  Social Connections: Moderately Integrated (06/28/2022)   Social Connection and Isolation Panel [NHANES]    Frequency of Communication with Friends and Family: More than three times a week    Frequency of Social Gatherings with Friends and Family: Once a week    Attends Religious Services: More than 4 times per year    Active Member of [employer] or Organizations: Yes    Attends Banker Meetings: More than 4 times per year    Marital Status: Widowed  Intimate Partner Violence: Not At Risk (05/31/2022)   Humiliation, Afraid, Rape, and Kick questionnaire    Fear of Current or Ex-Partner: No    Emotionally Abused: No    Physically Abused: No    Sexually Abused: No     Family History  Problem Relation Age of Onset   Hypertension Mother    Diabetes Father    Colon cancer Father    Breast cancer Grandchild       Current Outpatient Medications:    aspirin EC 81 MG tablet, Take 1 tablet (81 mg total) by mouth daily. Swallow whole., Disp: 100 tablet, Rfl: 12   atorvastatin (LIPITOR) 20 MG tablet, Take 1 tablet (20 mg total) by mouth daily., Disp: 90 tablet, Rfl: 1   benazepril (LOTENSIN) 40 MG tablet, Take 1 tablet (40 mg total) by mouth daily., Disp: 90 tablet, Rfl: 1   Calcium Carbonate-Vit D-Min (CALCIUM 600+D PLUS MINERALS) 600-400 MG-UNIT TABS, Take 1 tablet by mouth as directed., Disp: , Rfl:    clotrimazole-betamethasone (LOTRISONE) cream, Apply 1 application topically 2 (two) times daily., Disp: 30 g, Rfl: 0   Desoximetasone (TOPICORT) 0.25 % ointment, SMARTSIG:sparingly Topical Twice Daily, Disp: , Rfl:    hydrochlorothiazide (HYDRODIURIL) 25 MG tablet, Take 1 tablet (25 mg total) by mouth daily., Disp: 90 tablet, Rfl: 1   hydrocortisone (ANUSOL-HC) 2.5 % rectal cream, Place 1 application  rectally 2 (two) times daily.,  Disp: 30 g, Rfl: 12   latanoprost (XALATAN) 0.005 % ophthalmic solution, Place 1 drop into both eyes at bedtime., Disp: , Rfl: 6   levothyroxine (SYNTHROID) 112 MCG tablet, Take 1 tablet (112 mcg total) by mouth daily before breakfast., Disp: 90 tablet, Rfl: 3   Multiple Vitamins-Minerals (CENTRUM SILVER ULTRA WOMENS PO), Take 1 tablet by mouth daily., Disp: , Rfl:    Omega-3 Fatty Acids (FISH OIL) 1000 MG CAPS, Take 1,000 mg by mouth daily., Disp: , Rfl:    polyethylene glycol powder (GLYCOLAX/MIRALAX) 17 GM/SCOOP powder, Take 17 g by mouth 2 (two) times daily as needed., Disp: 3350 g, Rfl: 1   timolol (TIMOPTIC) 0.5 % ophthalmic solution, Place 1 drop into the left eye daily., Disp: , Rfl:    vitamin C (ASCORBIC ACID) 500 MG tablet, Take 500 mg by mouth daily., Disp: , Rfl:    Physical exam:  Vitals:   08/10/22 1420 08/10/22 1424  BP: (!) 153/79 (!) 142/62  Pulse: 69 73  Temp: (!) 96.7 F (35.9 C)   TempSrc: Tympanic   SpO2: 98%   Weight: 153 lb (69.4 kg)   Height:  5\' 3"  (1.6 m)    Physical Exam Cardiovascular:     Rate and Rhythm: Normal rate and regular rhythm.     Heart sounds: Normal heart sounds.  Pulmonary:     Effort: Pulmonary effort is normal.     Breath sounds: Normal breath sounds.  Abdominal:     General: Bowel sounds are normal.     Palpations: Abdomen is soft.  Skin:    General: Skin is warm and dry.  Neurological:     Mental Status: She is alert and oriented to person, place, and time.   Breast exam: No palpable masses in bilateral breast.  No palpable bilateral axillary adenopathy.       Latest Ref Rng & Units 03/30/2022    9:32 AM  CMP  Glucose 70 - 99 mg/dL 161   BUN 10 - 36 mg/dL 25   Creatinine 0.96 - 1.00 mg/dL 0.45   Sodium 409 - 811 mmol/L 135   Potassium 3.5 - 5.2 mmol/L 4.0   Chloride 96 - 106 mmol/L 98   CO2 20 - 29 mmol/L 25   Calcium 8.7 - 10.3 mg/dL 9.6   Total Protein 6.0 - 8.5 g/dL 6.6   Total Bilirubin 0.0 - 1.2 mg/dL 0.6   Alkaline Phos 44 - 121 IU/L 63   AST 0 - 40 IU/L 13   ALT 0 - 32 IU/L 14       Latest Ref Rng & Units 03/30/2022    9:32 AM  CBC  WBC 3.4 - 10.8 x10E3/uL 4.3   Hemoglobin 11.1 - 15.9 g/dL 91.4   Hematocrit 78.2 - 46.6 % 35.3   Platelets 150 - 450 x10E3/uL 373     No images are attached to the encounter.  MM RT BREAST BX W LOC DEV 1ST LESION IMAGE BX SPEC STEREO GUIDE  Addendum Date: 08/06/2022   ADDENDUM REPORT: 08/06/2022 12:53 ADDENDUM: PATHOLOGY revealed: A. BREAST, RIGHT UPPER OUTER QUADRANT; STEREOTACTIC CORE NEEDLE BIOPSY: - DUCTAL CARCINOMA IN SITU, HIGH-GRADE, WITH COMEDONECROSIS AND CALCIFICATIONS. - NO EVIDENCE OF INVASIVE CARCINOMA. SEE PATHOLOGY REPORT FOR DETAILS. Pathology results are CONCORDANT with imaging findings, per Dr. Jacob Moores. Pathology results and recommendations were discussed with patient via telephone on 08/06/2022. Patient reported biopsy site doing well with no adverse symptoms, and only slight tenderness at the site. Post  biopsy care  instructions were reviewed, questions were answered and my direct phone number was provided. Patient was instructed to call for any additional questions or concerns related to biopsy site. RECOMMENDATIONS: 1. Surgical and oncological consultation. Request for surgical and oncological consultation relayed to Irving Shows RN at [facility] by Donell Sievert, RN on Aug 06, 2022. Pathology results reported by Donell Sievert, RN on 08/06/2022. Electronically Signed   By: Jacob Moores M.D.   On: 08/06/2022 12:53   Result Date: 08/06/2022 CLINICAL DATA:  89 year old female presenting for stereotactic guided biopsy of indeterminate calcifications in the right breast EXAM: RIGHT BREAST STEREOTACTIC CORE NEEDLE BIOPSY COMPARISON:  Previous exam(s). FINDINGS: The patient and I discussed the procedure of stereotactic-guided biopsy including benefits and alternatives. We discussed the high likelihood of a successful procedure. We discussed the risks of the procedure including infection, bleeding, tissue injury, clip migration, and inadequate sampling. Informed written consent was given. The usual time out protocol was performed immediately prior to the procedure. Using sterile technique and 1% Lidocaine as local anesthetic, under stereotactic guidance, a 9 gauge vacuum assisted device was used to perform core needle biopsy of calcifications in the upper outer quadrant of the right breast using a superior approach. Specimen radiograph was performed showing representative calcifications. Specimens with calcifications are identified for pathology. Lesion quadrant: Upper outer quadrant At the conclusion of the procedure, ribbon shaped tissue marker clip was deployed into the biopsy cavity. Follow-up 2-view mammogram was performed and dictated separately. IMPRESSION: Stereotactic-guided biopsy of calcifications the upper-outer right breast. No apparent complications. Electronically Signed: By: Jacob Moores M.D.  On: 08/04/2022 08:19   MM CLIP PLACEMENT RIGHT  Result Date: 08/04/2022 CLINICAL DATA:  Post-procedure mammogram EXAM: 3D DIAGNOSTIC RIGHT MAMMOGRAM POST STEREOTACTIC BIOPSY COMPARISON:  Previous exam(s). FINDINGS: 3D Mammographic images were obtained following stereotactic guided biopsy of indeterminate calcifications in the upper-outer right breast. The ribbon shaped biopsy marking clip is in expected position at the site of biopsy. IMPRESSION: Appropriate positioning of the ribbon shaped biopsy marking clip at the site of biopsy in the right breast upper-outer quadrant. Final Assessment: Post Procedure Mammograms for Marker Placement Electronically Signed   By: Jacob Moores M.D.   On: 08/04/2022 08:23  MM 3D DIAGNOSTIC MAMMOGRAM UNILATERAL RIGHT BREAST  Result Date: 07/14/2022 CLINICAL DATA:  Callback for RIGHT breast calcifications. There remote history of an excisional biopsy EXAM: DIGITAL DIAGNOSTIC UNILATERAL RIGHT MAMMOGRAM WITH TOMOSYNTHESIS TECHNIQUE: Right digital diagnostic mammography and breast tomosynthesis was performed. COMPARISON:  Previous exam(s). ACR Breast Density Category c: The breasts are heterogeneously dense, which may obscure small masses. FINDINGS: Spot magnification views of the RIGHT breast demonstrate a 6 mm group of coarse and punctate calcifications in the RIGHT upper breast at anterior to middle depth. These are not stable in comparison to prior mammogram from 2016. There are 2 punctate calcifications in the RIGHT upper outer breast at anterior to middle depth which are stable dating back to 2016 and consistent with a benign etiology. No additional suspicious findings are noted. IMPRESSION: A 6 mm group of calcifications in the RIGHT upper breast at anterior to middle depth are indeterminate. Recommend stereotactic guided biopsy for definitive characterization. RECOMMENDATION: RIGHT breast stereotactic guided biopsy x1 I have discussed the findings and recommendations  with the patient. The biopsy procedure was discussed with the patient and questions were answered. Patient expressed their understanding of the biopsy recommendation. Patient will be scheduled for biopsy at her earliest convenience by  the schedulers. Ordering provider will be notified. If applicable, a reminder letter will be sent to the patient regarding the next appointment. BI-RADS CATEGORY  4: Suspicious. Electronically Signed   By: Meda Klinefelter M.D.   On: 07/14/2022 08:57   Assessment and plan- Patient is a 89 y.o. female with new diagnosis of right breast DCIS  I have reviewed mammogram findings with the patient in detail which showed a 6 mm group of calcifications in the upper outer quadrant.  This was biopsied and consistent with DCIS.  Discussed differences between DCIS and invasive breast cancer.  Discussed the potential risk of DCIS transforming into invasive cancer.  Since patient wanted to move forward with screening mammogram at the age of 50 and would like to act on the results of the mammogram I think it is very reasonable for her to go and see Dr. Claudine Brewer to discuss lumpectomy.  I will await ER testing on her final pathology.  I will discuss risks versus benefits of radiation and adjuvant endocrine therapy based on [facility] DCIS algorithm after her final pathology is back.  Treatment will be given with a curative intent.  Patient understands and agrees to proceed   Cancer Staging  Ductal carcinoma in situ (DCIS) of right breast Staging form: Breast, AJCC 8th Edition - Clinical stage from 08/11/2022: Stage 0 (cTis (DCIS), cN0, cM0) - Signed by Creig Hines, MD on 08/11/2022    Thank you for this kind referral and the opportunity to participate in the care of this patient   Visit Diagnosis 1. Ductal carcinoma in situ (DCIS) of right breast   2. Goals of care, counseling/discussion     Dr. Owens Shark, MD, MPH [facility] [phone] 08/11/2022

## 2022-08-12 ENCOUNTER — Ambulatory Visit: Payer: Self-pay | Admitting: Surgery

## 2022-08-12 ENCOUNTER — Ambulatory Visit (INDEPENDENT_AMBULATORY_CARE_PROVIDER_SITE_OTHER): Payer: PPO | Admitting: Surgery

## 2022-08-12 ENCOUNTER — Encounter: Payer: Self-pay | Admitting: Surgery

## 2022-08-12 VITALS — BP 150/83 | HR 88 | Temp 98.0°F | Ht 63.0 in | Wt 152.0 lb

## 2022-08-12 DIAGNOSIS — D0511 Intraductal carcinoma in situ of right breast: Secondary | ICD-10-CM

## 2022-08-12 NOTE — Patient Instructions (Addendum)
We have spoken today about removing a lump in your breast. This will be done by Dr. Claudine Mouton at [facility].  You will most likely be able to leave the hospital several hours after your surgery. Rarely, a patient needs to stay over night but this is a possibility.  Plan to tenatively be off work for 1-2 weeks following the surgery and may return with approximately 2 more weeks of a lifting restriction, no greater than 15 lbs.  Please see your Blue surgery sheet for more information. Our surgery scheduler will call you to look at surgery dates and to go over information.   What is radio frequency localization of the breast?(RFID) RFID tag localization uses radiofrequency technology to accurately pinpoint the tumor. Seeing exactly where the tumor is before surgery helps surgeons more effectively remove the entire tumor and spare surrounding healthy breast tissue.     Lumpectomy A lumpectomy is a form of "breast conserving" or "breast preservation" surgery. It may also be referred to as a partial mastectomy. During a lumpectomy, the portion of the breast that contains the cancerous tumor or breast mass (the lump) is removed. Some normal tissue around the lump may also be removed to make sure all of the tumor has been removed.  LET [facility] CARE PROVIDER KNOW ABOUT: Any allergies you have. All medicines you are taking, including vitamins, herbs, eye drops, creams, and over-the-counter medicines. Previous problems you or members of your family have had with the use of anesthetics. Any blood disorders you have. Previous surgeries you have had. Medical conditions you have. RISKS AND COMPLICATIONS Generally, this is a safe procedure. However, problems can occur and include: Bleeding. Infection. Pain. Temporary swelling. Change in the shape of the breast, particularly if a large portion is removed. BEFORE THE PROCEDURE Ask your health care provider about changing or stopping your regular  medicines. This is especially important if you are taking diabetes medicines or blood thinners. Do not eat or drink anything after midnight on the night before the procedure or as directed by your health care provider. Ask your health care provider if you can take a sip of water with any approved medicines. On the day of surgery, your health care provider will use a mammogram or ultrasound to locate and mark the tumor in your breast. These markings on your breast will show where the cut (incision) will be made. PROCEDURE  An IV tube will be put into one of your veins. You may be given medicine to help you relax before the surgery (sedative). You will be given one of the following: A medicine that numbs the area (local anesthetic). A medicine that makes you fall asleep (general anesthetic). Your health care provider will use a kind of electric scalpel that uses heat to minimize bleeding (electrocautery knife). A curved incision (like a smile or frown) that follows the natural curve of your breast is made, to allow for minimal scarring and better healing. The tumor will be removed with some of the surrounding tissue. This will be sent to the lab for analysis. Your health care provider may also remove your lymph nodes at this time if needed. Sometimes, but not always, a rubber tube called a drain will be surgically inserted into your breast area or armpit to collect excess fluid that may accumulate in the space where the tumor was. This drain is connected to a plastic bulb on the outside of your body. This drain creates suction to help remove the fluid. The incisions  will be closed with stitches (sutures). A bandage may be placed over the incisions. AFTER THE PROCEDURE You will be taken to the recovery area. You will be given medicine for pain. A small rubber drain may be placed in the breast for 2-3 days to prevent a collection of blood (hematoma) from developing in the breast. You will be given  instructions on caring for the drain before you go home. A pressure bandage (dressing) will be applied for 1-2 days to prevent bleeding. Ask your health care provider how to care for your bandage at home.

## 2022-08-13 ENCOUNTER — Ambulatory Visit: Payer: Self-pay | Admitting: Surgery

## 2022-08-16 ENCOUNTER — Telehealth: Payer: Self-pay | Admitting: Surgery

## 2022-08-16 ENCOUNTER — Other Ambulatory Visit: Payer: Self-pay | Admitting: Surgery

## 2022-08-16 DIAGNOSIS — D0511 Intraductal carcinoma in situ of right breast: Secondary | ICD-10-CM

## 2022-08-16 NOTE — Telephone Encounter (Signed)
Left message for patient to call, please inform her of the following regarding scheduled surgery with Dr. Claudine Mouton.   Pre-Admission date/time, and Surgery date at [facility]  Surgery Date: 09/01/22 Preadmission Testing Date: 08/27/22 (phone 8a-1p)  Also patient will need to call at [phone], between 1-3:00pm the day before surgery, to find out what time to arrive for surgery.  '

## 2022-08-17 ENCOUNTER — Encounter: Payer: Self-pay | Admitting: *Deleted

## 2022-08-17 DIAGNOSIS — H353131 Nonexudative age-related macular degeneration, bilateral, early dry stage: Secondary | ICD-10-CM | POA: Diagnosis not present

## 2022-08-17 DIAGNOSIS — H401131 Primary open-angle glaucoma, bilateral, mild stage: Secondary | ICD-10-CM | POA: Diagnosis not present

## 2022-08-17 DIAGNOSIS — H26493 Other secondary cataract, bilateral: Secondary | ICD-10-CM | POA: Diagnosis not present

## 2022-08-17 NOTE — Telephone Encounter (Signed)
Patient calls back, she is informed of all dates regarding surgery.   

## 2022-08-17 NOTE — Progress Notes (Signed)
Cassie Brewer is scheduled for lumpectomy on 6/5.  She will see [name] on 6/17 at 10:00.  Appt. Details given to her.

## 2022-08-18 ENCOUNTER — Ambulatory Visit
Admission: RE | Admit: 2022-08-18 | Discharge: 2022-08-18 | Disposition: A | Discharge status: Home / Self Care | Payer: PPO | Source: Ambulatory Visit | Attending: Surgery | Admitting: Surgery

## 2022-08-18 DIAGNOSIS — D0511 Intraductal carcinoma in situ of right breast: Secondary | ICD-10-CM | POA: Diagnosis not present

## 2022-08-18 HISTORY — PX: BREAST BIOPSY: SHX20

## 2022-08-18 MED ORDER — LIDOCAINE HCL 1 % IJ SOLN
10.0000 mL | Freq: Once | INTRAMUSCULAR | Status: AC
  Administered 2022-08-18: 10 mL
  Filled 2022-08-18: qty 10

## 2022-08-27 ENCOUNTER — Encounter
Admission: RE | Admit: 2022-08-27 | Discharge: 2022-08-27 | Disposition: A | Discharge status: Home / Self Care | Payer: PPO | Source: Ambulatory Visit | Attending: Surgery | Admitting: Surgery

## 2022-08-27 HISTORY — DX: Other specified postprocedural states: Z98.890

## 2022-08-27 HISTORY — DX: Pneumonia, unspecified organism: J18.9

## 2022-08-27 HISTORY — DX: Nausea with vomiting, unspecified: R11.2

## 2022-08-27 HISTORY — DX: Chronic kidney disease, stage 3a: N18.31

## 2022-08-27 NOTE — Patient Instructions (Addendum)
Your procedure is scheduled on: Wednesday, June 5 Report to the Registration Desk on the 1st floor of the [facility] To find out your arrival time, please call [phone] between 1PM - 3PM on: Tuesday, June 4 If your arrival time is 6:00 am, do not arrive before that time as the [facility] entrance doors do not open until 6:00 am.  REMEMBER: Instructions that are not followed completely may result in serious medical risk, up to and including death; or upon the discretion of your surgeon and anesthesiologist your surgery may need to be rescheduled.  Do not eat or drink after midnight the night before surgery.  No gum chewing or hard candies.  One week prior to surgery: starting today, May 31 Stop Anti-inflammatories (NSAIDS) such as Advil, Aleve, Ibuprofen, Motrin, Naproxen, Naprosyn and Aspirin based products such as Excedrin, Goody's Powder, BC Powder. Stop ANY OVER THE COUNTER supplements until after surgery. Stop vitamin C, Fish oil, multiple vitamins, calcium. You may however, continue to take Tylenol if needed for pain up until the day of surgery.  Continue taking all prescribed medications  TAKE ONLY THESE MEDICATIONS THE MORNING OF SURGERY WITH A SIP OF WATER:  Atorvastatin (Lipitor) Levothyroxine Timolol eye drops  No Alcohol for 24 hours before or after surgery.  No Smoking including e-cigarettes for 24 hours before surgery.  No chewable tobacco products for at least 6 hours before surgery.  No nicotine patches on the day of surgery.  Do not use any "recreational" drugs for at least a week (preferably 2 weeks) before your surgery.  Please be advised that the combination of cocaine and anesthesia may have negative outcomes, up to and including death. If you test positive for cocaine, your surgery will be cancelled.  On the morning of surgery brush your teeth with toothpaste and water, you may rinse your mouth with mouthwash if you wish. Do not swallow any  toothpaste or mouthwash.  Use CHG Soap as directed on instruction sheet.  Do not wear jewelry, make-up, hairpins, clips or nail polish.  Do not wear lotions, powders, or perfumes.   Do not shave body hair from the neck down 48 hours before surgery.  Contact lenses, hearing aids and dentures may not be worn into surgery.  Do not bring valuables to the hospital. [facility] is not responsible for any missing/lost belongings or valuables.   Notify your doctor if there is any change in your medical condition (cold, fever, infection).  Wear comfortable clothing (specific to your surgery type) to the hospital.  After surgery, you can help prevent lung complications by doing breathing exercises.  Take deep breaths and cough every 1-2 hours. Your doctor may order a device called an Incentive Spirometer to help you take deep breaths.  If you are being discharged the day of surgery, you will not be allowed to drive home. You will need a responsible individual to drive you home and stay with you for 24 hours after surgery.   If you are taking public transportation, you will need to have a responsible individual with you.  Please call the Pre-admissions Testing Dept. at [phone] if you have any questions about these instructions.  Surgery Visitation Policy:  Patients having surgery or a procedure may have two visitors.  Children under the age of 68 must have an adult with them who is not the patient.     Preparing for Surgery with CHLORHEXIDINE GLUCONATE (CHG) Soap  Chlorhexidine Gluconate (CHG) Soap  o An  antiseptic cleaner that kills germs and bonds with the skin to continue killing germs even after washing  o Used for showering the night before surgery and morning of surgery  Before surgery, you can play an important role by reducing the number of germs on your skin.  CHG (Chlorhexidine gluconate) soap is an antiseptic cleanser which kills germs and bonds with the skin to  continue killing germs even after washing.  Please do not use if you have an allergy to CHG or antibacterial soaps. If your skin becomes reddened/irritated stop using the CHG.  1. Shower the NIGHT BEFORE SURGERY and the MORNING OF SURGERY with CHG soap.  2. If you choose to wash your hair, wash your hair first as usual with your normal shampoo.  3. After shampooing, rinse your hair and body thoroughly to remove the shampoo.  4. Use CHG as you would any other liquid soap. You can apply CHG directly to the skin and wash gently with a scrungie or a clean washcloth.  5. Apply the CHG soap to your body only from the neck down. Do not use on open wounds or open sores. Avoid contact with your eyes, ears, mouth, and genitals (private parts). Wash face and genitals (private parts) with your normal soap.  6. Wash thoroughly, paying special attention to the area where your surgery will be performed.  7. Thoroughly rinse your body with warm water.  8. Do not shower/wash with your normal soap after using and rinsing off the CHG soap.  9. Pat yourself dry with a clean towel.  10. Wear clean pajamas to bed the night before surgery.  12. Place clean sheets on your bed the night of your first shower and do not sleep with pets.  13. Shower again with the CHG soap on the day of surgery prior to arriving at the hospital.  14. Do not apply any deodorants/lotions/powders.  15. Please wear clean clothes to the hospital.

## 2022-08-30 ENCOUNTER — Encounter
Admission: RE | Admit: 2022-08-30 | Discharge: 2022-08-30 | Disposition: A | Discharge status: Home / Self Care | Payer: PPO | Source: Ambulatory Visit | Attending: Surgery | Admitting: Surgery

## 2022-08-30 DIAGNOSIS — D0511 Intraductal carcinoma in situ of right breast: Secondary | ICD-10-CM

## 2022-08-30 DIAGNOSIS — Z01818 Encounter for other preprocedural examination: Secondary | ICD-10-CM | POA: Diagnosis not present

## 2022-08-30 DIAGNOSIS — Z01810 Encounter for preprocedural cardiovascular examination: Secondary | ICD-10-CM | POA: Diagnosis not present

## 2022-08-30 LAB — CBC WITH DIFFERENTIAL/PLATELET
Abs Immature Granulocytes: 0.02 10*3/uL (ref 0.00–0.07)
Basophils Absolute: 0.1 10*3/uL (ref 0.0–0.1)
Basophils Relative: 1 %
Eosinophils Absolute: 0.2 10*3/uL (ref 0.0–0.5)
Eosinophils Relative: 3 %
HCT: 35 % — ABNORMAL LOW (ref 36.0–46.0)
Hemoglobin: 11.8 g/dL — ABNORMAL LOW (ref 12.0–15.0)
Immature Granulocytes: 0 %
Lymphocytes Relative: 26 %
Lymphs Abs: 1.4 10*3/uL (ref 0.7–4.0)
MCH: 30 pg (ref 26.0–34.0)
MCHC: 33.7 g/dL (ref 30.0–36.0)
MCV: 89.1 fL (ref 80.0–100.0)
Monocytes Absolute: 0.7 10*3/uL (ref 0.1–1.0)
Monocytes Relative: 12 %
Neutro Abs: 3.1 10*3/uL (ref 1.7–7.7)
Neutrophils Relative %: 58 %
Platelets: 278 10*3/uL (ref 150–400)
RBC: 3.93 MIL/uL (ref 3.87–5.11)
RDW: 13.3 % (ref 11.5–15.5)
WBC: 5.4 10*3/uL (ref 4.0–10.5)
nRBC: 0 % (ref 0.0–0.2)

## 2022-08-30 LAB — COMPREHENSIVE METABOLIC PANEL
ALT: 15 U/L (ref 0–44)
AST: 19 U/L (ref 15–41)
Albumin: 4.1 g/dL (ref 3.5–5.0)
Alkaline Phosphatase: 52 U/L (ref 38–126)
Anion gap: 7 (ref 5–15)
BUN: 21 mg/dL (ref 8–23)
CO2: 25 mmol/L (ref 22–32)
Calcium: 9.2 mg/dL (ref 8.9–10.3)
Chloride: 103 mmol/L (ref 98–111)
Creatinine, Ser: 0.84 mg/dL (ref 0.44–1.00)
GFR, Estimated: >60 mL/min (ref >60)
Glucose, Bld: 106 mg/dL — ABNORMAL HIGH (ref 70–99)
Potassium: 3.9 mmol/L (ref 3.5–5.1)
Sodium: 135 mmol/L (ref 135–145)
Total Bilirubin: 0.9 mg/dL (ref 0.3–1.2)
Total Protein: 7.6 g/dL (ref 6.5–8.1)

## 2022-08-31 MED ORDER — LACTATED RINGERS IV SOLN: INTRAVENOUS | Status: DC

## 2022-08-31 MED ORDER — CHLORHEXIDINE GLUCONATE CLOTH 2 % EX PADS: 6.0000 | MEDICATED_PAD | Freq: Once | CUTANEOUS | Status: DC

## 2022-08-31 MED ORDER — CHLORHEXIDINE GLUCONATE CLOTH 2 % EX PADS
6.0000 | MEDICATED_PAD | Freq: Once | CUTANEOUS | Status: AC
  Administered 2022-09-01: 6 via TOPICAL

## 2022-08-31 MED ORDER — BUPIVACAINE LIPOSOME 1.3 % IJ SUSP: 20.0000 mL | Freq: Once | INTRAMUSCULAR | Status: DC

## 2022-08-31 MED ORDER — ACETAMINOPHEN 500 MG PO TABS
1000.0000 mg | ORAL_TABLET | ORAL | Status: AC
  Administered 2022-09-01: 1000 mg via ORAL

## 2022-08-31 MED ORDER — ORAL CARE MOUTH RINSE: 15.0000 mL | Freq: Once | OROMUCOSAL | Status: AC

## 2022-08-31 MED ORDER — CHLORHEXIDINE GLUCONATE 0.12 % MT SOLN
15.0000 mL | Freq: Once | OROMUCOSAL | Status: AC
  Administered 2022-09-01: 15 mL via OROMUCOSAL

## 2022-08-31 MED ORDER — GABAPENTIN 300 MG PO CAPS
300.0000 mg | ORAL_CAPSULE | ORAL | Status: AC
  Administered 2022-09-01: 300 mg via ORAL

## 2022-08-31 MED ORDER — FAMOTIDINE 20 MG PO TABS
20.0000 mg | ORAL_TABLET | Freq: Once | ORAL | Status: AC
  Administered 2022-09-01: 20 mg via ORAL

## 2022-09-01 ENCOUNTER — Ambulatory Visit: Payer: PPO | Admitting: Urgent Care

## 2022-09-01 ENCOUNTER — Ambulatory Visit
Admission: RE | Admit: 2022-09-01 | Discharge: 2022-09-01 | Disposition: A | Discharge status: Home / Self Care | Payer: PPO | Attending: Surgery | Admitting: Surgery

## 2022-09-01 ENCOUNTER — Other Ambulatory Visit: Payer: Self-pay

## 2022-09-01 ENCOUNTER — Encounter: Payer: Self-pay | Admitting: Surgery

## 2022-09-01 ENCOUNTER — Encounter
Admission: RE | Disposition: A | Discharge status: Home / Self Care | Payer: Self-pay | Source: Home / Self Care | Attending: Surgery

## 2022-09-01 ENCOUNTER — Ambulatory Visit: Payer: PPO | Admitting: Anesthesiology

## 2022-09-01 ENCOUNTER — Ambulatory Visit
Admission: RE | Admit: 2022-09-01 | Discharge: 2022-09-01 | Disposition: A | Discharge status: Home / Self Care | Payer: PPO | Source: Ambulatory Visit | Attending: Surgery | Admitting: Surgery

## 2022-09-01 DIAGNOSIS — Z90711 Acquired absence of uterus with remaining cervical stump: Secondary | ICD-10-CM | POA: Insufficient documentation

## 2022-09-01 DIAGNOSIS — D0511 Intraductal carcinoma in situ of right breast: Secondary | ICD-10-CM | POA: Insufficient documentation

## 2022-09-01 DIAGNOSIS — N6021 Fibroadenosis of right breast: Secondary | ICD-10-CM | POA: Diagnosis not present

## 2022-09-01 DIAGNOSIS — N1831 Chronic kidney disease, stage 3a: Secondary | ICD-10-CM | POA: Diagnosis not present

## 2022-09-01 DIAGNOSIS — Z803 Family history of malignant neoplasm of breast: Secondary | ICD-10-CM | POA: Diagnosis not present

## 2022-09-01 DIAGNOSIS — Z800 Family history of malignant neoplasm of digestive organs: Secondary | ICD-10-CM | POA: Insufficient documentation

## 2022-09-01 DIAGNOSIS — R928 Other abnormal and inconclusive findings on diagnostic imaging of breast: Secondary | ICD-10-CM | POA: Diagnosis not present

## 2022-09-01 DIAGNOSIS — I129 Hypertensive chronic kidney disease with stage 1 through stage 4 chronic kidney disease, or unspecified chronic kidney disease: Secondary | ICD-10-CM | POA: Diagnosis not present

## 2022-09-01 HISTORY — PX: BREAST LUMPECTOMY WITH RADIOFREQUENCY TAG IDENTIFICATION: SHX6884

## 2022-09-01 SURGERY — BREAST LUMPECTOMY WITH RADIOFREQUENCY TAG IDENTIFICATION
Anesthesia: General | Site: Breast | Laterality: Right

## 2022-09-01 MED ORDER — PROPOFOL 1000 MG/100ML IV EMUL
INTRAVENOUS | Status: AC
  Filled 2022-09-01: qty 100

## 2022-09-01 MED ORDER — BUPIVACAINE LIPOSOME 1.3 % IJ SUSP
INTRAMUSCULAR | Status: DC | PRN
  Administered 2022-09-01: 20 mL

## 2022-09-01 MED ORDER — FAMOTIDINE 20 MG PO TABS
ORAL_TABLET | ORAL | Status: AC
  Filled 2022-09-01: qty 1

## 2022-09-01 MED ORDER — IBUPROFEN 800 MG PO TABS
800.0000 mg | ORAL_TABLET | Freq: Three times a day (TID) | ORAL | 0 refills | Status: DC | PRN
Start: 2022-09-01 — End: 2022-09-13

## 2022-09-01 MED ORDER — ONDANSETRON HCL 4 MG/2ML IJ SOLN
INTRAMUSCULAR | Status: DC | PRN
  Administered 2022-09-01: 4 mg via INTRAVENOUS

## 2022-09-01 MED ORDER — PROPOFOL 10 MG/ML IV BOLUS
INTRAVENOUS | Status: DC | PRN
  Administered 2022-09-01 (×2): 100 mg via INTRAVENOUS
  Administered 2022-09-01: 30 mg via INTRAVENOUS
  Administered 2022-09-01: 50 mg via INTRAVENOUS
  Administered 2022-09-01: 80 ug/kg/min via INTRAVENOUS

## 2022-09-01 MED ORDER — BUPIVACAINE-EPINEPHRINE (PF) 0.25% -1:200000 IJ SOLN
INTRAMUSCULAR | Status: DC | PRN
  Administered 2022-09-01: 30 mL

## 2022-09-01 MED ORDER — PHENYLEPHRINE HCL-NACL 20-0.9 MG/250ML-% IV SOLN
INTRAVENOUS | Status: DC | PRN
  Administered 2022-09-01: 40 ug/min via INTRAVENOUS

## 2022-09-01 MED ORDER — HYDROCODONE-ACETAMINOPHEN 5-325 MG PO TABS
1.0000 | ORAL_TABLET | Freq: Four times a day (QID) | ORAL | 0 refills | Status: DC | PRN
Start: 2022-09-01 — End: 2022-09-13

## 2022-09-01 MED ORDER — CHLORHEXIDINE GLUCONATE 0.12 % MT SOLN
OROMUCOSAL | Status: AC
  Filled 2022-09-01: qty 15

## 2022-09-01 MED ORDER — ACETAMINOPHEN 500 MG PO TABS
ORAL_TABLET | ORAL | Status: AC
  Filled 2022-09-01: qty 2

## 2022-09-01 MED ORDER — STERILE WATER FOR IRRIGATION IR SOLN
Status: DC | PRN
  Administered 2022-09-01: 200 mL

## 2022-09-01 MED ORDER — ISOSULFAN BLUE 1 % ~~LOC~~ SOLN
SUBCUTANEOUS | Status: AC
  Filled 2022-09-01: qty 5

## 2022-09-01 MED ORDER — BUPIVACAINE HCL (PF) 0.25 % IJ SOLN
INTRAMUSCULAR | Status: AC
  Filled 2022-09-01: qty 30

## 2022-09-01 MED ORDER — PROPOFOL 10 MG/ML IV BOLUS
INTRAVENOUS | Status: AC
  Filled 2022-09-01: qty 20

## 2022-09-01 MED ORDER — LIDOCAINE HCL (CARDIAC) PF 100 MG/5ML IV SOSY
PREFILLED_SYRINGE | INTRAVENOUS | Status: DC | PRN
  Administered 2022-09-01: 40 mg via INTRAVENOUS

## 2022-09-01 MED ORDER — GABAPENTIN 300 MG PO CAPS
ORAL_CAPSULE | ORAL | Status: AC
  Filled 2022-09-01: qty 1

## 2022-09-01 MED ORDER — EPHEDRINE SULFATE (PRESSORS) 50 MG/ML IJ SOLN
INTRAMUSCULAR | Status: DC | PRN
  Administered 2022-09-01: 10 mg via INTRAVENOUS

## 2022-09-01 MED ORDER — CEFAZOLIN SODIUM-DEXTROSE 1-4 GM/50ML-% IV SOLN
INTRAVENOUS | Status: DC | PRN
  Administered 2022-09-01: 1 g via INTRAVENOUS

## 2022-09-01 MED ORDER — FENTANYL CITRATE (PF) 100 MCG/2ML IJ SOLN
INTRAMUSCULAR | Status: DC | PRN
  Administered 2022-09-01: 25 ug via INTRAVENOUS
  Administered 2022-09-01: 50 ug via INTRAVENOUS
  Administered 2022-09-01: 25 ug via INTRAVENOUS

## 2022-09-01 MED ORDER — BUPIVACAINE LIPOSOME 1.3 % IJ SUSP
INTRAMUSCULAR | Status: AC
  Filled 2022-09-01: qty 20

## 2022-09-01 MED ORDER — DEXAMETHASONE SODIUM PHOSPHATE 10 MG/ML IJ SOLN
INTRAMUSCULAR | Status: AC
  Filled 2022-09-01: qty 1

## 2022-09-01 MED ORDER — ONDANSETRON HCL 4 MG/2ML IJ SOLN
INTRAMUSCULAR | Status: AC
  Filled 2022-09-01: qty 2

## 2022-09-01 MED ORDER — FENTANYL CITRATE (PF) 100 MCG/2ML IJ SOLN
INTRAMUSCULAR | Status: AC
  Filled 2022-09-01: qty 2

## 2022-09-01 MED ORDER — EPINEPHRINE PF 1 MG/ML IJ SOLN
INTRAMUSCULAR | Status: AC
  Filled 2022-09-01: qty 1

## 2022-09-01 MED ORDER — DEXAMETHASONE SODIUM PHOSPHATE 10 MG/ML IJ SOLN
INTRAMUSCULAR | Status: DC | PRN
  Administered 2022-09-01: 8 mg via INTRAVENOUS

## 2022-09-01 MED ORDER — PHENYLEPHRINE HCL-NACL 20-0.9 MG/250ML-% IV SOLN
INTRAVENOUS | Status: AC
  Filled 2022-09-01: qty 250

## 2022-09-01 SURGICAL SUPPLY — 44 items
ADH SKN CLS APL DERMABOND .7 (GAUZE/BANDAGES/DRESSINGS) ×1
APL PRP STRL LF DISP 70% ISPRP (MISCELLANEOUS) ×1
APPLIER CLIP 9.375 SM OPEN (CLIP)
APR CLP SM 9.3 20 MLT OPN (CLIP)
BINDER BREAST LRG (GAUZE/BANDAGES/DRESSINGS) IMPLANT
BLADE SURG 15 STRL LF DISP TIS (BLADE) ×1 IMPLANT
BLADE SURG 15 STRL SS (BLADE) ×1
CHLORAPREP W/TINT 26 (MISCELLANEOUS) ×1 IMPLANT
CLIP APPLIE 9.375 SM OPEN (CLIP) IMPLANT
CNTNR URN SCR LID CUP LEK RST (MISCELLANEOUS) IMPLANT
CONT SPEC 4OZ STRL OR WHT (MISCELLANEOUS)
COVER PROBE GAMMA FINDER SLV (MISCELLANEOUS) ×1 IMPLANT
DERMABOND ADVANCED .7 DNX12 (GAUZE/BANDAGES/DRESSINGS) ×1 IMPLANT
DEVICE DUBIN SPECIMEN MAMMOGRA (MISCELLANEOUS) ×1 IMPLANT
DRAPE LAPAROTOMY TRNSV 106X77 (MISCELLANEOUS) ×1 IMPLANT
ELECT CAUTERY BLADE TIP 2.5 (TIP) ×1
ELECT REM PT RETURN 9FT ADLT (ELECTROSURGICAL) ×1
ELECTRODE CAUTERY BLDE TIP 2.5 (TIP) ×1 IMPLANT
ELECTRODE REM PT RTRN 9FT ADLT (ELECTROSURGICAL) ×1 IMPLANT
GAUZE 4X4 16PLY ~~LOC~~+RFID DBL (SPONGE) ×1 IMPLANT
GLOVE ORTHO TXT STRL SZ7.5 (GLOVE) ×1 IMPLANT
GOWN STRL REUS W/ TWL LRG LVL3 (GOWN DISPOSABLE) ×1 IMPLANT
GOWN STRL REUS W/ TWL XL LVL3 (GOWN DISPOSABLE) ×1 IMPLANT
GOWN STRL REUS W/TWL LRG LVL3 (GOWN DISPOSABLE) ×1
GOWN STRL REUS W/TWL XL LVL3 (GOWN DISPOSABLE) ×1
KIT MARKER MARGIN INK (KITS) IMPLANT
KIT TURNOVER KIT A (KITS) ×1 IMPLANT
MANIFOLD NEPTUNE II (INSTRUMENTS) ×1 IMPLANT
NDL HYPO 22X1.5 SAFETY MO (MISCELLANEOUS) ×1 IMPLANT
NEEDLE HYPO 22X1.5 SAFETY MO (MISCELLANEOUS) ×1 IMPLANT
PACK BASIN MINOR ARMC (MISCELLANEOUS) ×1 IMPLANT
SET LOCALIZER 20 PROBE US (MISCELLANEOUS) ×1 IMPLANT
SPIKE FLUID TRANSFER (MISCELLANEOUS) ×1 IMPLANT
SUT MNCRL 4-0 (SUTURE) ×1
SUT MNCRL 4-0 27XMFL (SUTURE) ×1
SUT VIC AB 3-0 SH 27 (SUTURE) ×1
SUT VIC AB 3-0 SH 27X BRD (SUTURE) ×1 IMPLANT
SUTURE MNCRL 4-0 27XMF (SUTURE) ×1 IMPLANT
SYR 10ML LL (SYRINGE) ×1 IMPLANT
SYR 20ML LL LF (SYRINGE) ×1 IMPLANT
TRAP FLUID SMOKE EVACUATOR (MISCELLANEOUS) ×1 IMPLANT
TRAP NEPTUNE SPECIMEN COLLECT (MISCELLANEOUS) ×1 IMPLANT
WATER STERILE IRR 1000ML POUR (IV SOLUTION) ×1 IMPLANT
WATER STERILE IRR 500ML POUR (IV SOLUTION) ×1 IMPLANT

## 2022-09-01 NOTE — Discharge Instructions (Signed)

## 2022-09-01 NOTE — Anesthesia Procedure Notes (Signed)
Procedure Name: LMA Insertion Date/Time: 09/01/2022 10:35 AM  Performed by: Allayne Butcher, RNPre-anesthesia Checklist: Patient identified, Patient being monitored, Timeout performed, Emergency Drugs available and Suction available Patient Re-evaluated:Patient Re-evaluated prior to induction Oxygen Delivery Method: Circle system utilized Preoxygenation: Pre-oxygenation with 100% oxygen Induction Type: IV induction Ventilation: Mask ventilation without difficulty LMA: LMA inserted LMA Size: 3.0 Tube type: Oral Number of attempts: 1 Placement Confirmation: positive ETCO2 and breath sounds checked- equal and bilateral Tube secured with: Tape Dental Injury: Teeth and Oropharynx as per pre-operative assessment

## 2022-09-01 NOTE — Interval H&P Note (Signed)
History and Physical Interval Note:  09/01/2022 10:04 AM  Cassie Brewer  has presented today for surgery, with the diagnosis of DCIS right breast.  The various methods of treatment have been discussed with the patient and family. After consideration of risks, benefits and other options for treatment, the patient has consented to  Procedure(s): BREAST LUMPECTOMY WITH RADIOFREQUENCY TAG IDENTIFICATION (Right) as a surgical intervention.  The patient's history has been reviewed, patient examined, no change in status, stable for surgery.  I have reviewed the patient's chart and labs.  Questions were answered to the patient's satisfaction.   The right breast is marked.   Campbell Lerner

## 2022-09-01 NOTE — Op Note (Addendum)
  Pre-operative Diagnosis: DCIS, right breast.    Post-operative Diagnosis: Same  Surgeon: Campbell Lerner, M.D., FACS  Anesthesia: General LMA  Procedure: Right lumpectomy, RFID tag directed  Procedure Details  The patient was seen again in the Holding Room. The benefits, complications, treatment options, and expected outcomes were discussed with the patient. The risks of bleeding, infection, recurrence of symptoms, failure to resolve symptoms, hematoma, seroma, open wound, cosmetic deformity, and the need for further surgery were discussed.  The patient was taken to Operating Room, identified as [name] and the procedure verified.  A Time Out was held and the above information confirmed.  Prior to the induction of general anesthesia, antibiotic prophylaxis was administered. VTE prophylaxis was in place. The patient was positioned in the supine position. Appropriate anesthesia was then administered and tolerated well. The LOCALizer is used to mark the skin for incision.  At that time it was noted there was a very adjacent scar just inferior to the site, there is also some deep scarring and tethering at that point as well.  I felt it prudent to excise this area alongside the area of our target. The tag appeared quite close to the skin based on the numbers I obtained from the localizer.  So I proceeded with an elliptical incision of the upper right breast. Dissection using the LOCALizer to perform a lumpectomy with adequate margins was performed. This was done with electrocautery and sharp dissection with Mayo scissors. There was minimal bleeding, and the cavity packed.  The specimen was taken to the back table and painted to demarcate the 6 surfaces of potential margin.   I returned to the cavity to remove the packing, and hemostasis was confirmed with electrocautery.   Once assuring that hemostasis was adequate and checked multiple times the wound was closed with interrupted 3-0 Vicryl,  approximating the cavity, and dermis, followed by 4-0 subcuticular Monocryl sutures. Tissue and dermal infiltration with local anesthesia was applied. Dermabond is utilized to seal the incision.  Additional local anesthesia was left within the lumpectomy cavity.  Findings: Faxitron imaging: Confirms adequacy of specimen extracted with markers well in place.  Estimated Blood Loss: Minimal         Drains: None         Specimens: Upper right breast lumpectomy/partial mastectomy specimen.       Complications: None.         Condition: Stable    Campbell Lerner, M.D., [facility]  09/01/2022 ; 11:53 AM

## 2022-09-01 NOTE — Anesthesia Postprocedure Evaluation (Signed)
Anesthesia Post Note  Patient: CARLISS BLATT  Procedure(s) Performed: BREAST LUMPECTOMY WITH RADIOFREQUENCY TAG IDENTIFICATION (Right: Breast)  Patient location during evaluation: PACU Anesthesia Type: General Level of consciousness: awake and alert Pain management: pain level controlled Vital Signs Assessment: post-procedure vital signs reviewed and stable Respiratory status: spontaneous breathing, nonlabored ventilation and respiratory function stable Cardiovascular status: blood pressure returned to baseline and stable Postop Assessment: no apparent nausea or vomiting Anesthetic complications: no   No notable events documented.   Last Vitals:  Vitals:   09/01/22 1215 09/01/22 1224  BP: (!) 150/73 (!) 157/74  Pulse: 70 75  Resp: 12   Temp: (!) 36.1 C 36.6 C  SpO2: 96% 97%    Last Pain:  Vitals:   09/01/22 1224  TempSrc: Temporal  PainSc: 0-No pain                 Foye Deer

## 2022-09-01 NOTE — Transfer of Care (Cosign Needed)
Immediate Anesthesia Transfer of Care Note  Patient: Cassie Brewer  Procedure(s) Performed: BREAST LUMPECTOMY WITH RADIOFREQUENCY TAG IDENTIFICATION (Right: Breast)  Patient Location: PACU  Anesthesia Type:General  Level of Consciousness: drowsy  Airway & Oxygen Therapy: Patient Spontanous Breathing and Patient connected to face mask oxygen  Post-op Assessment: Report given to RN and Post -op Vital signs reviewed and stable  Post vital signs: Reviewed and stable  Last Vitals:  Vitals Value Taken Time  BP 140/63 09/01/22 1145  Temp    Pulse 67 09/01/22 1146  Resp 21 09/01/22 1146  SpO2 99 % 09/01/22 1146  Vitals shown include unvalidated device data.  Last Pain:  Vitals:   09/01/22 0752  TempSrc: Temporal  PainSc: 0-No pain      Patients Stated Pain Goal: 0 (09/01/22 0752)  Complications: No notable events documented.

## 2022-09-01 NOTE — Anesthesia Preprocedure Evaluation (Addendum)
Anesthesia Evaluation  Patient identified by MRN, date of birth, ID band Patient awake    Reviewed: Allergy & Precautions, H&P , NPO status , Patient's Chart, lab work & pertinent test results  History of Anesthesia Complications (+) PONV and history of anesthetic complications (headaches)  Airway Mallampati: II  TM Distance: >3 FB Neck ROM: full    Dental no notable dental hx.    Pulmonary neg pulmonary ROS   Pulmonary exam normal        Cardiovascular hypertension, Normal cardiovascular exam  EKG 08/30/2022: Sinus rhythm with Premature supraventricular complexes Nonspecific ST and T wave abnormality  ECHO 12/21:  1. Left ventricular ejection fraction, by estimation, is 55 to 60%. The  left ventricle has normal function. The left ventricle has no regional  wall motion abnormalities. Left ventricular diastolic parameters were  normal.   2. Right ventricular systolic function is normal. The right ventricular  size is normal.   3. Left atrial size was mildly dilated.   4. Right atrial size was mildly dilated.   5. The mitral valve is normal in structure. Trivial mitral valve  regurgitation.   6. The aortic valve is normal in structure. Aortic valve regurgitation is  not visualized. Mild aortic valve sclerosis is present, with no evidence  of aortic valve stenosis.   7. Aortic Borderline Ao Root dilatation. There is borderline dilatation  of the ascending aorta.     Neuro/Psych CVA (2021), No Residual Symptoms  negative psych ROS   GI/Hepatic negative GI ROS, Neg liver ROS,,,  Endo/Other  Hypothyroidism    Renal/GU Renal InsufficiencyRenal disease     Musculoskeletal   Abdominal Normal abdominal exam  (+)   Peds  Hematology  (+) Blood dyscrasia, anemia   Anesthesia Other Findings Past Medical History: No date: Glaucoma No date: Hyperlipidemia No date: Hypertension 10/02/2014: Hypothyroidism No date:  Overactive bladder No date: Pneumonia No date: PONV (postoperative nausea and vomiting)     Comment:  extreme headaches also No date: Squamous cell skin cancer, face No date: Stage 3a chronic kidney disease (CKD) (HCC) 02/2020: Stroke (HCC)     Comment:  speech stroke  Past Surgical History: 08/04/2022: BREAST BIOPSY; Right     Comment:  Right Breast Stereo, Ribbon clip- path pending 08/04/2022: BREAST BIOPSY; Right     Comment:  MM RT BREAST BX W LOC DEV 1ST LESION IMAGE BX SPEC               STEREO GUIDE 08/04/2022 [facility] 08/18/2022: BREAST BIOPSY; Right     Comment:  MM RT RADIO FREQUENCY TAG LOC MAMMO GUIDE 08/18/2022               [facility] No date: BREAST SURGERY; Bilateral     Comment:  cysts removal No date: CATARACT EXTRACTION W/ INTRAOCULAR LENS  IMPLANT, BILATERAL 1982: PARTIAL HYSTERECTOMY No date: SURGICAL EXCISION OF EXCESSIVE SKIN; Bilateral     Comment:  x4 1960's and 1970's 1984: THYROIDECTOMY, PARTIAL; Right     Reproductive/Obstetrics negative OB ROS                             Anesthesia Physical Anesthesia Plan  ASA: 3  Anesthesia Plan: General LMA   Post-op Pain Management: Tylenol PO (pre-op)*, Regional block* and Gabapentin PO (pre-op)*   Induction: Intravenous  PONV Risk Score and Plan: 2 and Dexamethasone and Ondansetron  Airway Management Planned: LMA  Additional Equipment:   Intra-op  Plan:   Post-operative Plan: Extubation in OR  Informed Consent: I have reviewed the patients History and Physical, chart, labs and discussed the procedure including the risks, benefits and alternatives for the proposed anesthesia with the patient or authorized representative who has indicated his/her understanding and acceptance.     Dental Advisory Given  Plan Discussed with: Anesthesiologist, CRNA and Surgeon  Anesthesia Plan Comments:         Anesthesia Quick Evaluation

## 2022-09-09 ENCOUNTER — Other Ambulatory Visit: Payer: Self-pay | Admitting: Pathology

## 2022-09-09 LAB — SURGICAL PATHOLOGY

## 2022-09-13 ENCOUNTER — Inpatient Hospital Stay: Payer: PPO | Attending: Oncology | Admitting: Oncology

## 2022-09-13 ENCOUNTER — Encounter: Payer: Self-pay | Admitting: Oncology

## 2022-09-13 VITALS — BP 143/65 | HR 68 | Temp 98.2°F | Resp 18 | Ht 63.0 in | Wt 153.1 lb

## 2022-09-13 DIAGNOSIS — Z7982 Long term (current) use of aspirin: Secondary | ICD-10-CM | POA: Insufficient documentation

## 2022-09-13 DIAGNOSIS — E039 Hypothyroidism, unspecified: Secondary | ICD-10-CM | POA: Diagnosis not present

## 2022-09-13 DIAGNOSIS — N1831 Chronic kidney disease, stage 3a: Secondary | ICD-10-CM | POA: Diagnosis not present

## 2022-09-13 DIAGNOSIS — I129 Hypertensive chronic kidney disease with stage 1 through stage 4 chronic kidney disease, or unspecified chronic kidney disease: Secondary | ICD-10-CM | POA: Insufficient documentation

## 2022-09-13 DIAGNOSIS — Z79899 Other long term (current) drug therapy: Secondary | ICD-10-CM | POA: Diagnosis not present

## 2022-09-13 DIAGNOSIS — D0511 Intraductal carcinoma in situ of right breast: Secondary | ICD-10-CM | POA: Diagnosis not present

## 2022-09-13 NOTE — Progress Notes (Signed)
Hematology/Oncology Consult note [facility]  [phone] [phone]  Patient Care Team: Dorcas Carrow, DO as PCP - General (Family Medicine) Debbrah Alar, MD (Dermatology) Linus Galas, [place] (Podiatry) Gwyneth Revels, DPM as Referring Physician (Podiatry)   Name of the patient: Cassie Brewer  [id number]  27-Feb-1929   Date of visit: 09/13/22  Diagnosis-right breast DCIS  Chief complaint/ Reason for visit-routine follow-up of right breast DCIS  Heme/Onc history: patient is a 89 year old female with a past medical history significant for hypertension hypothyroidism, stage III CKD who wanted to undergo a screening mammogram since it has been a while since she got the previous one.  Mammogram showed possible calcifications in the right breast.  This was followed by diagnostic mammogramWhich showed 6 mm group of calcifications in the right upper breast.  This was biopsied and was consistent with DCIS high-grade with comedonecrosis and calcifications.   Patient underwent right lumpectomy on 09/01/2022.  Pathology showed 16 mm grade 3 DCIS with necrosis.  DCIS present at inferior and posterior margins.  ER low +1 to 10%.   Interval history-patient is recovering well from her lumpectomy.  Denies any specific complaints at this time.  She is here with her daughter today  ECOG PS- 1 Pain scale- 0   Review of systems- Review of Systems  Constitutional:  Negative for chills, fever, malaise/fatigue and weight loss.  HENT:  Negative for congestion, ear discharge and nosebleeds.   Eyes:  Negative for blurred vision.  Respiratory:  Negative for cough, hemoptysis, sputum production, shortness of breath and wheezing.   Cardiovascular:  Negative for chest pain, palpitations, orthopnea and claudication.  Gastrointestinal:  Negative for abdominal pain, blood in stool, constipation, diarrhea, heartburn, melena, nausea and vomiting.  Genitourinary:   Negative for dysuria, flank pain, frequency, hematuria and urgency.  Musculoskeletal:  Negative for back pain, joint pain and myalgias.  Skin:  Negative for rash.  Neurological:  Negative for dizziness, tingling, focal weakness, seizures, weakness and headaches.  Endo/Heme/Allergies:  Does not bruise/bleed easily.  Psychiatric/Behavioral:  Negative for depression and suicidal ideas. The patient does not have insomnia.       Allergies  Allergen Reactions   Amoxicillin     Other reaction(s): Other (See Comments) "blood pressure very high - almost passed out"   Eryc [Erythromycin] Other (See Comments)   Sulfa Antibiotics Nausea And Vomiting    Nausea with bactrim in 2017       Past Medical History:  Diagnosis Date   Glaucoma    Hyperlipidemia    Hypertension    Hypothyroidism 10/02/2014   Overactive bladder    Pneumonia    PONV (postoperative nausea and vomiting)    extreme headaches also   Squamous cell skin cancer, face    Stage 3a chronic kidney disease (CKD) (HCC)    Stroke (HCC) 02/2020   speech stroke     Past Surgical History:  Procedure Laterality Date   BREAST BIOPSY Right 08/04/2022   Right Breast Stereo, Ribbon clip- path pending   BREAST BIOPSY Right 08/04/2022   MM RT BREAST BX W LOC DEV 1ST LESION IMAGE BX SPEC STEREO GUIDE 08/04/2022 [facility]   BREAST BIOPSY Right 08/18/2022   MM RT RADIO FREQUENCY TAG LOC MAMMO GUIDE 08/18/2022 [facility]   BREAST LUMPECTOMY WITH RADIOFREQUENCY TAG IDENTIFICATION Right 09/01/2022   Procedure: BREAST LUMPECTOMY WITH RADIOFREQUENCY TAG IDENTIFICATION;  Surgeon: Campbell Lerner, MD;  Location: [facility];  Service: General;  Laterality: Right;  BREAST SURGERY Bilateral    cysts removal   CATARACT EXTRACTION W/ INTRAOCULAR LENS  IMPLANT, BILATERAL     PARTIAL HYSTERECTOMY  1982   SURGICAL EXCISION OF EXCESSIVE SKIN Bilateral    x4 1960's and 1970's   THYROIDECTOMY, PARTIAL Right 1984    Social History    Socioeconomic History   Marital status: Widowed    Spouse name: Not on file   Number of children: Not on file   Years of education: Not on file   Highest education level: Associate degree: occupational, Scientist, product/process development, or vocational program  Occupational History   Not on file  Tobacco Use   Smoking status: Never    Passive exposure: Never   Smokeless tobacco: Never  Vaping Use   Vaping Use: Never used  Substance and Sexual Activity   Alcohol use: No   Drug use: No   Sexual activity: Not Currently    Birth control/protection: Surgical  Other Topics Concern   Not on file  Social History Narrative   Lives alone   Social Determinants of Health   Financial Resource Strain: Low Risk  (06/28/2022)   Overall Financial Resource Strain (CARDIA)    Difficulty of Paying Living Expenses: Not hard at all  Food Insecurity: No Food Insecurity (08/10/2022)   Hunger Vital Sign    Worried About Running Out of Food in the Last Year: Never true    Ran Out of Food in the Last Year: Never true  Transportation Needs: No Transportation Needs (08/10/2022)   PRAPARE - Administrator, Civil Service (Medical): No    Lack of Transportation (Non-Medical): No  Physical Activity: Insufficiently Active (06/28/2022)   Exercise Vital Sign    Days of Exercise per Week: 3 days    Minutes of Exercise per Session: 30 min  Stress: No Stress Concern Present (06/28/2022)   [employer] of Occupational Health - Occupational Stress Questionnaire    Feeling of Stress : Only a little  Social Connections: Moderately Integrated (06/28/2022)   Social Connection and Isolation Panel [NHANES]    Frequency of Communication with Friends and Family: More than three times a week    Frequency of Social Gatherings with Friends and Family: Once a week    Attends Religious Services: More than 4 times per year    Active Member of [employer] or Organizations: Yes    Attends Banker Meetings: More than 4 times per  year    Marital Status: Widowed  Intimate Partner Violence: Not At Risk (05/31/2022)   Humiliation, Afraid, Rape, and Kick questionnaire    Fear of Current or Ex-Partner: No    Emotionally Abused: No    Physically Abused: No    Sexually Abused: No    Family History  Problem Relation Age of Onset   Hypertension Mother    Diabetes Father    Colon cancer Father    Breast cancer Grandchild      Current Outpatient Medications:    aspirin EC 81 MG tablet, Take 1 tablet (81 mg total) by mouth daily. Swallow whole., Disp: 100 tablet, Rfl: 12   atorvastatin (LIPITOR) 20 MG tablet, Take 1 tablet (20 mg total) by mouth daily., Disp: 90 tablet, Rfl: 1   benazepril (LOTENSIN) 40 MG tablet, Take 1 tablet (40 mg total) by mouth daily. (Patient taking differently: Take 40 mg by mouth at bedtime.), Disp: 90 tablet, Rfl: 1   Calcium Carbonate-Vit D-Min (CALCIUM 600+D PLUS MINERALS) 600-400 MG-UNIT TABS, Take 1 tablet  by mouth daily at 12 noon., Disp: , Rfl:    clotrimazole-betamethasone (LOTRISONE) cream, Apply 1 application topically 2 (two) times daily. (Patient taking differently: Apply 1 application  topically as needed.), Disp: 30 g, Rfl: 0   Desoximetasone (TOPICORT) 0.25 % ointment, Apply 1 Application topically as needed., Disp: , Rfl:    hydrochlorothiazide (HYDRODIURIL) 25 MG tablet, Take 1 tablet (25 mg total) by mouth daily., Disp: 90 tablet, Rfl: 1   HYDROcodone-acetaminophen (NORCO/VICODIN) 5-325 MG tablet, Take 1 tablet by mouth every 6 (six) hours as needed for moderate pain., Disp: 15 tablet, Rfl: 0   hydrocortisone (ANUSOL-HC) 2.5 % rectal cream, Place 1 application  rectally 2 (two) times daily. (Patient taking differently: Place 1 application  rectally as needed.), Disp: 30 g, Rfl: 12   ibuprofen (ADVIL) 800 MG tablet, Take 1 tablet (800 mg total) by mouth every 8 (eight) hours as needed., Disp: 30 tablet, Rfl: 0   latanoprost (XALATAN) 0.005 % ophthalmic solution, Place 1 drop into  both eyes at bedtime., Disp: , Rfl: 6   levothyroxine (SYNTHROID) 112 MCG tablet, Take 1 tablet (112 mcg total) by mouth daily before breakfast., Disp: 90 tablet, Rfl: 3   Multiple Vitamins-Minerals (CENTRUM SILVER ULTRA WOMENS PO), Take 1 tablet by mouth daily., Disp: , Rfl:    Omega-3 Fatty Acids (FISH OIL) 1000 MG CAPS, Take 1,000 mg by mouth daily., Disp: , Rfl:    polyethylene glycol powder (GLYCOLAX/MIRALAX) 17 GM/SCOOP powder, Take 17 g by mouth 2 (two) times daily as needed., Disp: 3350 g, Rfl: 1   timolol (TIMOPTIC) 0.5 % ophthalmic solution, Place 1 drop into the left eye daily., Disp: , Rfl:    vitamin C (ASCORBIC ACID) 500 MG tablet, Take 500 mg by mouth daily., Disp: , Rfl:   Physical exam: There were no vitals filed for this visit. Physical Exam Cardiovascular:     Rate and Rhythm: Normal rate and regular rhythm.     Heart sounds: Normal heart sounds.  Pulmonary:     Effort: Pulmonary effort is normal.  Skin:    General: Skin is warm and dry.  Neurological:     Mental Status: She is alert and oriented to person, place, and time.         Latest Ref Rng & Units 08/30/2022   11:43 AM  CMP  Glucose 70 - 99 mg/dL 409   BUN 8 - 23 mg/dL 21   Creatinine 8.11 - 1.00 mg/dL 9.14   Sodium 782 - 956 mmol/L 135   Potassium 3.5 - 5.1 mmol/L 3.9   Chloride 98 - 111 mmol/L 103   CO2 22 - 32 mmol/L 25   Calcium 8.9 - 10.3 mg/dL 9.2   Total Protein 6.5 - 8.1 g/dL 7.6   Total Bilirubin 0.3 - 1.2 mg/dL 0.9   Alkaline Phos 38 - 126 U/L 52   AST 15 - 41 U/L 19   ALT 0 - 44 U/L 15       Latest Ref Rng & Units 08/30/2022   11:43 AM  CBC  WBC 4.0 - 10.5 K/uL 5.4   Hemoglobin 12.0 - 15.0 g/dL 21.3   Hematocrit 08.6 - 46.0 % 35.0   Platelets 150 - 400 K/uL 278     No images are attached to the encounter.  MM Breast Surgical Specimen  Result Date: 09/01/2022 CLINICAL DATA:  Status post radiofrequency ID tag localized RIGHT breast lumpectomy. EXAM: SPECIMEN RADIOGRAPH OF THE RIGHT  BREAST COMPARISON:  Previous  exam(s). FINDINGS: Status post excision of the RIGHT breast. The radiofrequency ID tag and RIBBON shaped clip are present within the specimen. A few of the described punctate residual calcifications are noted within the surgical specimen. IMPRESSION: Specimen radiograph of the RIGHT breast. These results were called by telephone at the time of interpretation on 09/01/2022 at 11:18 am to provider [facility] , who verbally acknowledged these results. Electronically Signed   By: Meda Klinefelter M.D.   On: 09/01/2022 11:19  MM RT RADIO FREQUENCY TAG LOC MAMMO GUIDE  Result Date: 08/18/2022 CLINICAL DATA:  Recently diagnosed ductal carcinoma in-situ, here for preoperative seed localization. EXAM: MAMMOGRAPHIC GUIDED RADIOFREQUENCY DEVICE LOCALIZATION OF THE RIGHT BREAST COMPARISON:  Previous exam(s). FINDINGS: Patient presents for radiofrequency device localization prior to surgical excision. I met with the patient and we discussed the procedure of radiofrequency device localization including benefits and alternatives. We discussed the high likelihood of a successful procedure. We discussed the risks of the procedure including infection, bleeding, tissue injury and further surgery. Informed, written consent was given. The usual time-out protocol was performed immediately prior to the procedure. Using mammographic guidance, sterile technique, 1% lidocaine as local anesthesia, a radiofrequency tag was used to localize calcifications in the upper-outer quadrant of the right breast which represent biopsy-proven ductal carcinoma in-situ using a superior approach. The follow-up mammogram images confirm that the RF device is in the expected location and are marked for the patient's surgeon. The patient tolerated the procedure well and was released from the [facility]. IMPRESSION: Radiofrequency device localization of the right breast. Of note, the device is located within the remaining  microcalcifications, 5 mm superior to the ribbon shaped biopsy marking clip. No apparent complications. Electronically Signed   By: Romona Curls M.D.   On: 08/18/2022 16:29    Assessment and plan- Patient is a 89 y.o. female with new diagnosis of right breast DCIS ER low +1 to 10% with positive margins ER for further discussions  Patient had a lumpectomy by Dr. Claudine Mouton on 09/01/2022 which showed a 16 mm grade 3 DCIS.  Ideally we need to millimeter negative margins at the time of surgery but patient was found to have positive inferior and posterior margin.  Ideally patient needs reexcision surgery to negative margins but given that patient is 89 years of age, I have discussed her case with radiation oncology and they are in favor of offering her postlumpectomy radiation instead of proceeding with reexcision surgery.  I am referring her to radiation oncology at this time.  As per [facility] DCIS algorithm her 5 and 10-year risk of recurrence without radiation therapy is at 14 and 25%.  This risk will come down to 8% and 5% with radiation.  Given that her ER expression on her DCIS was low at 1 to 10% I do not think that she would derive any significant benefit from adjuvant endocrine therapy at her age.  Both tamoxifen and aromatase inhibitors can be associated with side effects such as hot flashes, vaginal dryness, worsening bone health and overall risks of medication outweigh the small possible benefit.  I will see her back in 6 months for a routine breast exam   Cancer Staging  Ductal carcinoma in situ (DCIS) of right breast Staging form: Breast, AJCC 8th Edition - Clinical stage from 08/11/2022: Stage 0 (cTis (DCIS), cN0, cM0) - Signed by Creig Hines, MD on 08/11/2022 - Pathologic stage from 09/13/2022: Stage Unknown (pTis (DCIS), pNX, cM0, G3, ER-, PR: Not Assessed,  HER2: Not Assessed) - Signed by Creig Hines, MD on 09/13/2022 Stage prefix: Initial diagnosis Nuclear grade: G3 Multigene prognostic  tests performed: None Histologic grading system: 3 grade system     Visit Diagnosis 1. Ductal carcinoma in situ (DCIS) of right breast      Dr. Owens Shark, MD, MPH [facility] [phone] 09/13/2022 8:36 AM

## 2022-09-14 ENCOUNTER — Encounter: Payer: Self-pay | Admitting: Surgery

## 2022-09-14 ENCOUNTER — Ambulatory Visit (INDEPENDENT_AMBULATORY_CARE_PROVIDER_SITE_OTHER): Payer: PPO | Admitting: Surgery

## 2022-09-14 ENCOUNTER — Encounter: Payer: Self-pay | Admitting: Radiation Oncology

## 2022-09-14 ENCOUNTER — Ambulatory Visit
Admission: RE | Admit: 2022-09-14 | Discharge: 2022-09-14 | Disposition: A | Discharge status: Home / Self Care | Payer: PPO | Source: Ambulatory Visit | Attending: Radiation Oncology | Admitting: Radiation Oncology

## 2022-09-14 VITALS — BP 162/76 | HR 76 | Temp 98.4°F | Ht 63.5 in | Wt 151.8 lb

## 2022-09-14 VITALS — BP 187/88 | HR 76 | Temp 97.5°F | Resp 16 | Wt 152.0 lb

## 2022-09-14 DIAGNOSIS — Z79899 Other long term (current) drug therapy: Secondary | ICD-10-CM | POA: Diagnosis not present

## 2022-09-14 DIAGNOSIS — Z800 Family history of malignant neoplasm of digestive organs: Secondary | ICD-10-CM | POA: Insufficient documentation

## 2022-09-14 DIAGNOSIS — E039 Hypothyroidism, unspecified: Secondary | ICD-10-CM | POA: Diagnosis not present

## 2022-09-14 DIAGNOSIS — Z803 Family history of malignant neoplasm of breast: Secondary | ICD-10-CM | POA: Insufficient documentation

## 2022-09-14 DIAGNOSIS — I129 Hypertensive chronic kidney disease with stage 1 through stage 4 chronic kidney disease, or unspecified chronic kidney disease: Secondary | ICD-10-CM | POA: Insufficient documentation

## 2022-09-14 DIAGNOSIS — Z7982 Long term (current) use of aspirin: Secondary | ICD-10-CM | POA: Diagnosis not present

## 2022-09-14 DIAGNOSIS — Z09 Encounter for follow-up examination after completed treatment for conditions other than malignant neoplasm: Secondary | ICD-10-CM

## 2022-09-14 DIAGNOSIS — I10 Essential (primary) hypertension: Secondary | ICD-10-CM | POA: Insufficient documentation

## 2022-09-14 DIAGNOSIS — E785 Hyperlipidemia, unspecified: Secondary | ICD-10-CM | POA: Diagnosis not present

## 2022-09-14 DIAGNOSIS — Z170 Estrogen receptor positive status [ER+]: Secondary | ICD-10-CM | POA: Diagnosis not present

## 2022-09-14 DIAGNOSIS — N3281 Overactive bladder: Secondary | ICD-10-CM | POA: Insufficient documentation

## 2022-09-14 DIAGNOSIS — N1831 Chronic kidney disease, stage 3a: Secondary | ICD-10-CM | POA: Diagnosis not present

## 2022-09-14 DIAGNOSIS — D0511 Intraductal carcinoma in situ of right breast: Secondary | ICD-10-CM

## 2022-09-14 DIAGNOSIS — Z8673 Personal history of transient ischemic attack (TIA), and cerebral infarction without residual deficits: Secondary | ICD-10-CM | POA: Diagnosis not present

## 2022-09-14 DIAGNOSIS — Z923 Personal history of irradiation: Secondary | ICD-10-CM | POA: Diagnosis not present

## 2022-09-14 DIAGNOSIS — Z79890 Hormone replacement therapy (postmenopausal): Secondary | ICD-10-CM | POA: Insufficient documentation

## 2022-09-14 NOTE — Progress Notes (Signed)
[facility] POST-OP OFFICE VISIT  09/14/2022  HPI: Cassie Brewer is a 89 y.o. female 13 days s/p right upper partial mastectomy.  She has had no complaints of pain, she is already seen hematology oncology and radiation oncology.  Vital signs: BP (!) 162/76   Pulse 76   Temp 98.4 F (36.9 C) (Oral)   Ht 5' 3.5" (1.613 m)   Wt 151 lb 12.8 oz (68.9 kg)   LMP 05/13/1980   SpO2 95%   BMI 26.47 kg/m    Physical Exam: Constitutional: She appears well back at her baseline. Skin: Right breast scar is healing nicely.  There is no ecchymosis there is no inflammatory changes to the suture line.  There is no seroma or hematoma.  Assessment/Plan: This is a 89 y.o. female 13 days s/p right upper breast partial mastectomy for DCIS.  SURGICAL PATHOLOGY * THIS IS AN ADDENDUM REPORT * CASE: [phone] [phone] PATIENT: Cassie Brewer Surgical Pathology Report *Addendum *  Reason for Addendum #1:  Breast Biomarker Results  Specimen Submitted: A. Breast, right upper  Clinical History: DCIS right breast  DIAGNOSIS: A.  BREAST, RIGHT UPPER; LUMPECTOMY: - HIGH-GRADE DUCTAL CARCINOMA IN SITU (DCIS), COMEDO-TYPE. - SEE CANCER SUMMARY BELOW. - SCLEROSING ADENOSIS, APOCRINE METAPLASIA, ATYPICAL LOBULAR HYPERPLASIA, USUAL DUCTAL HYPERPLASIA, AND FOCAL NODULAR SCLEROSIS. - SKIN WITH PRIOR BIOPSY SITE CHANGE. - BIOPSY SITE CHANGE WITH CLIP. - RFID TAG IN PLACE.  CANCER CASE SUMMARY: DUCTAL CARCINOMA IN SITU OF THE BREAST Standard(s): AJCC-UICC 8  SPECIMEN Procedure: Lumpectomy Specimen Laterality: Right  TUMOR Histologic Type: Ductal carcinoma in situ Size (Extent) of DCIS:  at least 16 mm Nuclear Grade: Grade 3 (high) Necrosis: Present, central (expansive comedonecrosis)  MARGINS Margin Status: DCIS present at margin:    Inferior, unifocal    Posterior, unifocal DCIS 0.9 mm to anterior margin  REGIONAL LYMPH NODES Regional Lymph node Status: Not  applicable  DISTANT METASTASIS Distant Site(s) Involved, if applicable (select all that apply): Not applicable  PATHOLOGIC STAGE CLASSIFICATION (pTNM, AJCC 8th Edition) TNM Descriptors: Not applicable pTis (DCIS) Regional Lymph Nodes Modifier: Not applicable pN - Not applicable pM - Not applicable  SPECIAL STUDIES Breast Biomarker Testing will be performed and reported in an addendum.  GROSS DESCRIPTION: A. Labeled: Right breast upper Received: Fresh Specimen radiograph image(s) available for review Radiographic findings: A clip and RF ID tag are present Time in fixative: Collected at 11:05 AM on 09/01/2022 and placed in formalin at 11:21 AM on 09/01/2022                                   cold ischemic time: Less than 1 hour Total fixation time: Approximately 30 hours Type of procedure: Breast lumpectomy Location / laterality of specimen: Right Orientation of specimen: The specimen is received inked and oriented Inking: Anterior = green Inferior = blue Lateral = orange Medial = yellow Posterior = black Superior = red Size of specimen: 9.0 x 5.8 x 2.0 cm Skin: There is a 6.8 x 3.4 cm ellipse of skin on the anterior margin  Biopsy site: A biopsy site containing a ribbon clip is identified  Number of discrete masses: None grossly identified.  There is a 9.0 x 3.5 x 1.5 cm area of ill-defined dense fibrous tissue spanning the inferior half of the specimen.  The clip is contained within this area. Margins: 0.8 cm to inferior, 0.7 cm to posterior, 2.0 cm  to anterior, 1.2 cm to superior, 2.5 cm to medial, and 6.5 cm to lateral Description of remainder of tissue: The tissue is yellow and lobular with the area of fibrous tissue occupying approximately 60% of the specimen.  Block summary (approximately 60% of entire specimen submitted): 1 -2 - perpendicular sections of lateral margin 3-44 - fibrous tissue, entirely submitted from lateral to medial      25-clip site 45-46 -  perpendicular sections of medial margin  CM 09/02/2022  Final Diagnosis performed by Elijah Birk, MD.   Electronically signed 09/08/2022 1:33:38PM The electronic signature indicates that the named Attending Pathologist has evaluated the specimen Technical component performed at [facility], [address] [place], [place] [address] Lab: [phone] Dir: Jolene Schimke, MD, MMM  Professional component performed at [facility], [facility], [address] [place], [place], [place] [address] Lab: [phone] Dir: Beryle Quant, MD  ADDENDUM: CASE SUMMARY: BREAST BIOMARKER TESTS Estrogen Receptor (ER) Status: LOW POSITIVE                      Percentage of cells with nuclear positivity: 1-10%                      Average intensity of staining: Weak                      Internal controls cells: Present and stain as expected Comment: The cancer in this sample has a low level (1-10%) of ER expression by IHC.  There are limited data on the overall benefit of endocrine therapies for patients with low level (1-10%) ER expression, but they currently suggest possible benefit, so patients are considered eligible for endocrine treatment.  There are data that suggest invasive cancers with these results are heterogenous in both behavior and biology and often have gene expression profiles more similar to ER negative.    Patient Active Problem List   Diagnosis Date Noted   Ductal carcinoma in situ (DCIS) of right breast 08/11/2022   Senile purpura (HCC) 03/30/2022   Constipation 03/06/2021   Aphasia 03/21/2020   Aphasia S/P CVA 03/11/2020   TIA (transient ischemic attack) 03/10/2020   Arthritis of foot 04/24/2015   Benign hypertensive renal disease 10/02/2014   Hypothyroidism 10/02/2014   Hyperlipidemia 10/02/2014   CKD (chronic kidney disease), stage III (HCC) 10/02/2014    -Any focal positive margins inferiorly and posteriorly, despite rather aggressive resection.  She has follow-up  scheduled with [name] and Dr. Rushie Chestnut.  Anticipating no additional reexcision.  May follow-up as needed, always a delight to see this pleasant lady.   Campbell Lerner M.D., FACS 09/14/2022, 2:14 PM

## 2022-09-14 NOTE — Consult Note (Signed)
NEW PATIENT EVALUATION  Name: Cassie Brewer  MRN: [id number]  Date:   09/14/2022     DOB: Aug 16, 1928   This 89 y.o. 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  REFERRING PHYSICIAN: Dorcas Carrow, DO  CHIEF COMPLAINT:  Chief Complaint  Patient presents with   Breast Cancer    DIAGNOSIS: The encounter diagnosis was Ductal carcinoma in situ (DCIS) of right breast.   PREVIOUS INVESTIGATIONS:  Mammogram ultrasound reviewed Pathology report reviewed Clinical notes reviewed  HPI: Patient is a 89 year old female who presented with calcifications warranting further evaluation on screening mammogram.  There was a 6 mm group of calcifications in the right upper breast anterior to middle depth.  Stereotactic guided biopsy was positive for ductal carcinoma in situ.  She underwent wide local excision for high-grade ductal carcinoma in situ with comedo type necrosis tumor was grade 3.  There was unifocal involvement of the inferior and posterior margins.  Tumor span 1.6 cm.  She has done well postoperatively is seen today for ration oncology opinion.  She specifically denies breast tenderness cough or bone pain.  She is accompanied by her daughter today.  PLANNED TREATMENT REGIMEN: Right whole breast radiation hypofractionated  PAST MEDICAL HISTORY:  has a past medical history of Glaucoma, Hyperlipidemia, Hypertension, Hypothyroidism (10/02/2014), Overactive bladder, Pneumonia, PONV (postoperative nausea and vomiting), Squamous cell skin cancer, face, Stage 3a chronic kidney disease (CKD) (HCC), and Stroke (HCC) (02/2020).    PAST SURGICAL HISTORY:  Past Surgical History:  Procedure Laterality Date   BREAST BIOPSY Right 08/04/2022   Right Breast Stereo, Ribbon clip- path pending   BREAST BIOPSY Right 08/04/2022   MM RT BREAST BX W  LOC DEV 1ST LESION IMAGE BX SPEC STEREO GUIDE 08/04/2022 [facility]   BREAST BIOPSY Right 08/18/2022   MM RT RADIO FREQUENCY TAG LOC MAMMO GUIDE 08/18/2022 [facility]   BREAST LUMPECTOMY WITH RADIOFREQUENCY TAG IDENTIFICATION Right 09/01/2022   Procedure: BREAST LUMPECTOMY WITH RADIOFREQUENCY TAG IDENTIFICATION;  Surgeon: Campbell Lerner, MD;  Location: [facility];  Service: General;  Laterality: Right;   BREAST SURGERY Bilateral    cysts removal   CATARACT EXTRACTION W/ INTRAOCULAR LENS  IMPLANT, BILATERAL     PARTIAL HYSTERECTOMY  1982   SURGICAL EXCISION OF EXCESSIVE SKIN Bilateral    x4 1960's and 1970's   THYROIDECTOMY, PARTIAL Right 1984    FAMILY HISTORY: family history includes Breast cancer in her grandchild; Colon cancer in her father; Diabetes in her father; Hypertension in her mother.  SOCIAL HISTORY:  reports that she has never smoked. She has never been exposed to tobacco smoke. She has never used smokeless tobacco. She reports that she does not drink alcohol and does not use drugs.  ALLERGIES: Amoxicillin, Eryc [erythromycin], and Sulfa antibiotics  MEDICATIONS:  Current Outpatient Medications  Medication Sig Dispense Refill   aspirin EC 81 MG tablet Take 1 tablet (81 mg total) by mouth daily. Swallow whole. 100 tablet 12   atorvastatin (LIPITOR) 20 MG tablet Take 1 tablet (20 mg total) by mouth daily. 90 tablet 1   benazepril (LOTENSIN) 40 MG tablet Take 1 tablet (40 mg total) by mouth daily. (Patient taking differently: Take 40 mg by mouth at bedtime.) 90 tablet 1   Calcium Carbonate-Vit D-Min (CALCIUM 600+D PLUS MINERALS) 600-400 MG-UNIT TABS Take 1 tablet by mouth daily at  12 noon.     clotrimazole-betamethasone (LOTRISONE) cream Apply 1 application topically 2 (two) times daily. (Patient taking differently: Apply 1 application  topically as needed.) 30 g 0   Desoximetasone (TOPICORT) 0.25 % ointment Apply 1 Application topically as needed.      hydrochlorothiazide (HYDRODIURIL) 25 MG tablet Take 1 tablet (25 mg total) by mouth daily. 90 tablet 1   hydrocortisone (ANUSOL-HC) 2.5 % rectal cream Place 1 application  rectally 2 (two) times daily. (Patient taking differently: Place 1 application  rectally as needed.) 30 g 12   latanoprost (XALATAN) 0.005 % ophthalmic solution Place 1 drop into both eyes at bedtime.  6   levothyroxine (SYNTHROID) 112 MCG tablet Take 1 tablet (112 mcg total) by mouth daily before breakfast. 90 tablet 3   Multiple Vitamins-Minerals (CENTRUM SILVER ULTRA WOMENS PO) Take 1 tablet by mouth daily.     Omega-3 Fatty Acids (FISH OIL) 1000 MG CAPS Take 1,000 mg by mouth daily.     polyethylene glycol powder (GLYCOLAX/MIRALAX) 17 GM/SCOOP powder Take 17 g by mouth 2 (two) times daily as needed. 3350 g 1   timolol (TIMOPTIC) 0.5 % ophthalmic solution Place 1 drop into the left eye daily.     vitamin C (ASCORBIC ACID) 500 MG tablet Take 500 mg by mouth daily.     No current facility-administered medications for this encounter.    ECOG PERFORMANCE STATUS:  0 - Asymptomatic  REVIEW OF SYSTEMS: Patient denies any weight loss, fatigue, weakness, fever, chills or night sweats. Patient denies any loss of vision, blurred vision. Patient denies any ringing  of the ears or hearing loss. No irregular heartbeat. Patient denies heart murmur or history of fainting. Patient denies any chest pain or pain radiating to her upper extremities. Patient denies any shortness of breath, difficulty breathing at night, cough or hemoptysis. Patient denies any swelling in the lower legs. Patient denies any nausea vomiting, vomiting of blood, or coffee ground material in the vomitus. Patient denies any stomach pain. Patient states has had normal bowel movements no significant constipation or diarrhea. Patient denies any dysuria, hematuria or significant nocturia. Patient denies any problems walking, swelling in the joints or loss of balance. Patient  denies any skin changes, loss of hair or loss of weight. Patient denies any excessive worrying or anxiety or significant depression. Patient denies any problems with insomnia. Patient denies excessive thirst, polyuria, polydipsia. Patient denies any swollen glands, patient denies easy bruising or easy bleeding. Patient denies any recent infections, allergies or URI. Patient "s visual fields have not changed significantly in recent time.   PHYSICAL EXAM: BP (!) 187/88   Pulse 76   Temp (!) 97.5 F (36.4 C) (Tympanic)   Resp 16   Wt 152 lb (68.9 kg)   LMP 05/13/1980   BMI 26.93 kg/m  She is status post wide local excision of the right breast incision is healed well.  No dominant masses noted in either breast no axillary or supraclavicular adenopathy is appreciated.  Well-developed well-nourished patient in NAD. HEENT reveals PERLA, EOMI, discs not visualized.  Oral cavity is clear. No oral mucosal lesions are identified. Neck is clear without evidence of cervical or supraclavicular adenopathy. Lungs are clear to A&P. Cardiac examination is essentially unremarkable with regular rate and rhythm without murmur rub or thrill. Abdomen is benign with no organomegaly or masses noted. Motor sensory and DTR levels are equal and symmetric in the upper and lower extremities. Cranial nerves II through XII are grossly  intact. Proprioception is intact. No peripheral adenopathy or edema is identified. No motor or sensory levels are noted. Crude visual fields are within normal range.  LABORATORY DATA: Pathology reports reviewed    RADIOLOGY RESULTS: Mammogram and ultrasound reviewed compatible with above-stated findings   IMPRESSION: Low ER positive ductal carcinoma in situ of the right breast status post wide local excision with unifocal positive margins posterior inferior in 89 year old female  PLAN: At this time I have recommended going ahead with hypofractionated whole breast radiation.  Will treat her whole  breast over 3 weeks boosting her scar another 1600 centigrade using photon beam based on the positive margins.  This will spare the patient any reexcision.  Risk and benefits of radiation including skin reaction fatigue alteration blood counts possible inclusion of superficial lung all were discussed in detail with the patient and her daughter.  They both comprehend my treatment plan well.  I personally set up and ordered CT simulation for early next week.  I would like to take this opportunity to thank you for allowing me to participate in the care of your patient.Carmina Miller, MD

## 2022-09-14 NOTE — Patient Instructions (Addendum)
If you have any concerns or questions, please feel free to call our office.   Lumpectomy, Care After The following information offers guidance on how to care for yourself after your procedure. Your health care provider may also give you more specific instructions. If you have problems or questions, contact your health care provider. What can I expect after the procedure? After the procedure, it is common to have: Some pain or redness at the incision site. Breast swelling. Breast tenderness. Stiffness in your arm or shoulder. A change in the shape and feel of your breast. Scar tissue that feels hard to the touch in the area where the lump was removed. Follow these instructions at home: Medicines Take over-the-counter and prescription medicines only as told by your health care provider. If you were prescribed an antibiotic, take it as told by your health care provider. Do not stop taking the antibiotic even if you start to feel better. Ask your health care provider if the medicine prescribed to you: Requires you to avoid driving or using machinery. Can cause constipation. You may need to take these actions to prevent or treat constipation: Drink enough fluid to keep your urine pale yellow. Take over-the-counter or prescription medicines. Eat foods that are high in fiber, such as beans, whole grains, and fresh fruits and vegetables. Limit foods that are high in fat and processed sugars, such as fried or sweet foods. Incision care     Follow instructions from your health care provider about how to take care of your incision. Make sure you: Wash your hands with soap and water for at least 20 seconds before and after you change your bandage (dressing). If soap and water are not available, use hand sanitizer. Change your dressing as told by your health care provider. Leave stitches (sutures), skin glue, or adhesive strips in place. These skin closures may need to stay in place for 2 weeks or  longer. If adhesive strip edges start to loosen and curl up, you may trim the loose edges. Do not remove adhesive strips completely unless your health care provider tells you to do that. Check your incision area every day for signs of infection. Check for: More redness, swelling, or pain. Fluid or blood. Warmth. Pus or a bad smell. Keep your dressing clean and dry. If you were sent home with a surgical drain in place, follow instructions from your health care provider about emptying it. Bathing Do not take baths, swim, or use a hot tub until your health care provider approves. Ask your health care provider if you may take showers. You may only be allowed to take sponge baths. Activity Rest as told by your health care provider. Do not sit for a long time without moving. Get up to take short walks every 1-2 hours. This will improve blood flow and breathing. Ask for help if you feel weak or unsteady. Be careful to avoid any activities that could cause an injury to your arm on the side of your surgery. Do not lift anything that is heavier than 10 lb (4.5 kg), or the limit that you are told, until your health care provider says that it is safe. Avoid lifting with the arm that is on the side of your surgery. Do not carry heavy objects on your shoulder on the side of your surgery. Do exercises to keep your shoulder and arm from getting stiff and swollen. Talk with your health care provider about which exercises are safe for you. Return to your   normal activities as told by your health care provider. Ask your health care provider what activities are safe for you. General instructions Wear a supportive bra as told by your health care provider. Raise (elevate) your arm above the level of your heart while you are sitting or lying down. Do not wear tight jewelry on your arm, wrist, or fingers on the side of your surgery. Wear compression stockings as told by your health care provider. These stockings help  to prevent blood clots and reduce swelling in your legs. If you had any lymph nodes removed during your procedure, be sure to tell all of your health care providers. It is important to share this information before you have certain procedures, such as blood tests or blood pressure measurements. Keep all follow-up visits. You may need to be screened for extra fluid around the lymph nodes and swelling in the breast and arm (lymphedema). Contact a health care provider if: You develop a rash. You have a fever. Your pain worsens or pain medicine is not working. You have swelling, weakness, or numbness in your arm that does not improve after a few weeks. You have new swelling in your breast. You have any of these signs of infection: More redness, swelling, or pain in your incision area. Fluid or blood coming from your incision. Warmth coming from the incision area. Pus or a bad smell coming from your incision. Get help right away if: You have very bad pain in your breast or arm. You have swelling in your legs or arms. You have redness, warmth, or pain in your leg or arm. You have chest pain. You have difficulty breathing. These symptoms may be an emergency. Get help right away. Call 911. Do not wait to see if the symptoms will go away. Do not drive yourself to the hospital. Summary After the procedure, it is common to have breast tenderness, swelling in your breast, and stiffness in your arm and shoulder. Follow instructions from your health care provider about how to take care of your incision. Do not lift anything that is heavier than 10 lb (4.5 kg), or the limit that you are told, until your health care provider says that it is safe. Avoid lifting with the arm that is on the side of your surgery. If you had any lymph nodes removed during your procedure, be sure to tell all of your health care providers. This information is not intended to replace advice given to you by your health care  provider. Make sure you discuss any questions you have with your health care provider. Document Revised: 05/24/2021 Document Reviewed: 05/24/2021 Elsevier Patient Education  2024 [employer]  

## 2022-09-15 DIAGNOSIS — D0511 Intraductal carcinoma in situ of right breast: Secondary | ICD-10-CM | POA: Diagnosis not present

## 2022-09-21 ENCOUNTER — Encounter: Payer: Self-pay | Admitting: *Deleted

## 2022-09-21 ENCOUNTER — Ambulatory Visit
Admission: RE | Admit: 2022-09-21 | Discharge: 2022-09-21 | Disposition: A | Discharge status: Home / Self Care | Payer: PPO | Source: Ambulatory Visit | Attending: Radiation Oncology | Admitting: Radiation Oncology

## 2022-09-21 DIAGNOSIS — D0511 Intraductal carcinoma in situ of right breast: Secondary | ICD-10-CM | POA: Insufficient documentation

## 2022-09-23 DIAGNOSIS — D0511 Intraductal carcinoma in situ of right breast: Secondary | ICD-10-CM | POA: Diagnosis not present

## 2022-09-24 ENCOUNTER — Other Ambulatory Visit: Payer: Self-pay | Admitting: *Deleted

## 2022-09-24 DIAGNOSIS — D0511 Intraductal carcinoma in situ of right breast: Secondary | ICD-10-CM

## 2022-09-28 ENCOUNTER — Ambulatory Visit (INDEPENDENT_AMBULATORY_CARE_PROVIDER_SITE_OTHER): Payer: PPO | Admitting: Family Medicine

## 2022-09-28 ENCOUNTER — Encounter: Payer: Self-pay | Admitting: Family Medicine

## 2022-09-28 ENCOUNTER — Ambulatory Visit
Admission: RE | Admit: 2022-09-28 | Discharge: 2022-09-28 | Disposition: A | Discharge status: Home / Self Care | Payer: PPO | Source: Ambulatory Visit | Attending: Radiation Oncology | Admitting: Radiation Oncology

## 2022-09-28 VITALS — BP 152/68 | HR 62 | Temp 98.0°F | Wt 150.6 lb

## 2022-09-28 DIAGNOSIS — E039 Hypothyroidism, unspecified: Secondary | ICD-10-CM | POA: Diagnosis not present

## 2022-09-28 DIAGNOSIS — E785 Hyperlipidemia, unspecified: Secondary | ICD-10-CM | POA: Diagnosis not present

## 2022-09-28 DIAGNOSIS — D0511 Intraductal carcinoma in situ of right breast: Secondary | ICD-10-CM | POA: Diagnosis not present

## 2022-09-28 DIAGNOSIS — Z510 Encounter for antineoplastic radiation therapy: Secondary | ICD-10-CM | POA: Diagnosis not present

## 2022-09-28 DIAGNOSIS — I129 Hypertensive chronic kidney disease with stage 1 through stage 4 chronic kidney disease, or unspecified chronic kidney disease: Secondary | ICD-10-CM | POA: Diagnosis not present

## 2022-09-28 MED ORDER — ATORVASTATIN CALCIUM 20 MG PO TABS: 20.0000 mg | ORAL_TABLET | Freq: Every day | ORAL | 1 refills | Status: DC

## 2022-09-28 MED ORDER — HYDROCHLOROTHIAZIDE 25 MG PO TABS: 25.0000 mg | ORAL_TABLET | Freq: Every day | ORAL | 1 refills | Status: DC

## 2022-09-28 MED ORDER — BENAZEPRIL HCL 40 MG PO TABS: 40.0000 mg | ORAL_TABLET | Freq: Every day | ORAL | 1 refills | Status: DC

## 2022-09-28 NOTE — Progress Notes (Signed)
          Acute Office Visit   Patient: Cassie Brewer   DOB: 23-Dec-1983   89 y.o. Female  MRN: [id number] Visit Date: 09/28/2022  Today's healthcare provider: Olevia Perches, DO  Introduced myself to the patient as a PA-C and provided education on APPs in clinical practice.    Chief Complaint  Patient presents with   Hypertension   Subjective    HPI    Medications: Outpatient Medications Prior to Visit  Medication Sig   aspirin EC 81 MG tablet Take 1 tablet (81 mg total) by mouth daily. Swallow whole.   atorvastatin (LIPITOR) 20 MG tablet Take 1 tablet (20 mg total) by mouth daily.   benazepril (LOTENSIN) 40 MG tablet Take 1 tablet (40 mg total) by mouth daily. (Patient taking differently: Take 40 mg by mouth at bedtime.)   Calcium Carbonate-Vit D-Min (CALCIUM 600+D PLUS MINERALS) 600-400 MG-UNIT TABS Take 1 tablet by mouth daily at 12 noon.   clotrimazole-betamethasone (LOTRISONE) cream Apply 1 application topically 2 (two) times daily. (Patient taking differently: Apply 1 application  topically as needed.)   Desoximetasone (TOPICORT) 0.25 % ointment Apply 1 Application topically as needed.   hydrochlorothiazide (HYDRODIURIL) 25 MG tablet Take 1 tablet (25 mg total) by mouth daily.   hydrocortisone (ANUSOL-HC) 2.5 % rectal cream Place 1 application  rectally 2 (two) times daily. (Patient taking differently: Place 1 application  rectally as needed.)   latanoprost (XALATAN) 0.005 % ophthalmic solution Place 1 drop into both eyes at bedtime.   levothyroxine (SYNTHROID) 112 MCG tablet Take 1 tablet (112 mcg total) by mouth daily before breakfast.   Multiple Vitamins-Minerals (CENTRUM SILVER ULTRA WOMENS PO) Take 1 tablet by mouth daily.   Omega-3 Fatty Acids (FISH OIL) 1000 MG CAPS Take 1,000 mg by mouth daily.   polyethylene glycol powder (GLYCOLAX/MIRALAX) 17 GM/SCOOP powder Take 17 g by mouth 2 (two) times daily as needed.   timolol (TIMOPTIC) 0.5 % ophthalmic solution Place 1  drop into the left eye daily.   vitamin C (ASCORBIC ACID) 500 MG tablet Take 500 mg by mouth daily.   No facility-administered medications prior to visit.    Review of Systems     Objective    BP (!) 149/82   Pulse 62   Temp 98 F (36.7 C) (Oral)   Wt 150 lb 9.6 oz (68.3 kg)   LMP 05/13/1980   SpO2 98%   BMI 26.26 kg/m    Physical Exam    No results found for any visits on 09/28/22.  Assessment & Plan      No follow-ups on file.

## 2022-09-28 NOTE — Assessment & Plan Note (Signed)
Following with oncology. Call with any concerns. Continue to monitor.  

## 2022-09-28 NOTE — Assessment & Plan Note (Signed)
Rechecking labs today. Await results. Treat as needed.  °

## 2022-09-28 NOTE — Assessment & Plan Note (Signed)
Running a little high. About to start radiation. Will watch BP while at the cancer center and adjust as needed.

## 2022-09-28 NOTE — Assessment & Plan Note (Signed)
Under good control on current regimen. Continue current regimen. Continue to monitor. Call with any concerns. Refills given. Labs drawn today.   

## 2022-09-28 NOTE — Progress Notes (Signed)
BP (!) 152/68   Pulse 62   Temp 98 F (36.7 C) (Oral)   Wt 150 lb 9.6 oz (68.3 kg)   LMP 05/13/1980   SpO2 98%   BMI 26.26 kg/m    Subjective:    Patient ID: Cassie Brewer, female    DOB: [birth year], 89 y.o.   MRN: [id number]  HPI: Cassie Brewer is a 89 y.o. female  Chief Complaint  Patient presents with   Hypertension   HYPERTENSION / HYPERLIPIDEMIA Satisfied with current treatment? yes Duration of hypertension: chronic BP monitoring frequency: a few times a week BP range: 130s at home BP medication side effects: no Past BP meds: hydrochlorothiazide, benazepril Duration of hyperlipidemia: chronic Cholesterol medication side effects: no Cholesterol supplements: none Past cholesterol medications: atorvastatin Medication compliance: excellent compliance Aspirin: yes Recent stressors: no Recurrent headaches: no Visual changes: no Palpitations: no Dyspnea: no Chest pain: no Lower extremity edema: no Dizzy/lightheaded: no  HYPOTHYROIDISM Thyroid control status:controlled Satisfied with current treatment? yes Medication side effects: no Medication compliance: excellent compliance Recent dose adjustment:no Fatigue: no Cold intolerance: no Heat intolerance: no Weight gain: no Weight loss: no Constipation: no Diarrhea/loose stools: no Palpitations: no Lower extremity edema: no Anxiety/depressed mood: no  Relevant past medical, surgical, family and social history reviewed and updated as indicated. Interim medical history since our last visit reviewed. Allergies and medications reviewed and updated.  Review of Systems  Constitutional: Negative.   Respiratory: Negative.    Cardiovascular: Negative.   Musculoskeletal: Negative.   Neurological: Negative.   Psychiatric/Behavioral: Negative.      Per HPI unless specifically indicated above     Objective:    BP (!) 152/68   Pulse 62   Temp 98 F (36.7 C) (Oral)   Wt 150 lb 9.6 oz (68.3 kg)   LMP  05/13/1980   SpO2 98%   BMI 26.26 kg/m   Wt Readings from Last 3 Encounters:  09/28/22 150 lb 9.6 oz (68.3 kg)  09/14/22 152 lb (68.9 kg)  09/14/22 151 lb 12.8 oz (68.9 kg)    Physical Exam Vitals and nursing note reviewed.  Constitutional:      General: She is not in acute distress.    Appearance: Normal appearance. She is normal weight. She is not ill-appearing, toxic-appearing or diaphoretic.  HENT:     Head: Normocephalic and atraumatic.     Right Ear: External ear normal.     Left Ear: External ear normal.     Nose: Nose normal.     Mouth/Throat:     Mouth: Mucous membranes are moist.     Pharynx: Oropharynx is clear.  Eyes:     General: No scleral icterus.       Right eye: No discharge.        Left eye: No discharge.     Extraocular Movements: Extraocular movements intact.     Conjunctiva/sclera: Conjunctivae normal.     Pupils: Pupils are equal, round, and reactive to light.  Cardiovascular:     Rate and Rhythm: Normal rate and regular rhythm.     Pulses: Normal pulses.     Heart sounds: Normal heart sounds. No murmur heard.    No friction rub. No gallop.  Pulmonary:     Effort: Pulmonary effort is normal. No respiratory distress.     Breath sounds: Normal breath sounds. No stridor. No wheezing, rhonchi or rales.  Chest:     Chest wall: No tenderness.  Musculoskeletal:  General: Normal range of motion.     Cervical back: Normal range of motion and neck supple.  Skin:    General: Skin is warm and dry.     Capillary Refill: Capillary refill takes less than 2 seconds.     Coloration: Skin is not jaundiced or pale.     Findings: No bruising, erythema, lesion or rash.  Neurological:     General: No focal deficit present.     Mental Status: She is alert and oriented to person, place, and time. Mental status is at baseline.  Psychiatric:        Mood and Affect: Mood normal.        Behavior: Behavior normal.        Thought Content: Thought content normal.         Judgment: Judgment normal.       Assessment & Plan:   Problem List Items Addressed This Visit       Endocrine   Hypothyroidism    Rechecking labs today. Await results. Treat as needed.       Relevant Orders   TSH     Genitourinary   Benign hypertensive renal disease - Primary    Running a little high. About to start radiation. Will watch BP while at the cancer center and adjust as needed.       Relevant Medications   benazepril (LOTENSIN) 40 MG tablet   hydrochlorothiazide (HYDRODIURIL) 25 MG tablet   Other Relevant Orders   CBC with Differential/Platelet   Comprehensive metabolic panel     Other   Hyperlipidemia    Under good control on current regimen. Continue current regimen. Continue to monitor. Call with any concerns. Refills given. Labs drawn today.        Relevant Medications   atorvastatin (LIPITOR) 20 MG tablet   benazepril (LOTENSIN) 40 MG tablet   hydrochlorothiazide (HYDRODIURIL) 25 MG tablet   Other Relevant Orders   CBC with Differential/Platelet   Comprehensive metabolic panel   Lipid Panel w/o Chol/HDL Ratio   Ductal carcinoma in situ (DCIS) of right breast    Following with oncology. Call with any concerns. Continue to monitor.         Follow up plan: Return in about 3 months (around 12/29/2022).

## 2022-09-29 ENCOUNTER — Other Ambulatory Visit: Payer: Self-pay | Admitting: Family Medicine

## 2022-09-29 ENCOUNTER — Ambulatory Visit
Admission: RE | Admit: 2022-09-29 | Discharge: 2022-09-29 | Disposition: A | Discharge status: Home / Self Care | Payer: PPO | Source: Ambulatory Visit | Attending: Radiation Oncology | Admitting: Radiation Oncology

## 2022-09-29 ENCOUNTER — Other Ambulatory Visit: Payer: Self-pay

## 2022-09-29 DIAGNOSIS — Z510 Encounter for antineoplastic radiation therapy: Secondary | ICD-10-CM | POA: Diagnosis not present

## 2022-09-29 DIAGNOSIS — D0511 Intraductal carcinoma in situ of right breast: Secondary | ICD-10-CM | POA: Diagnosis not present

## 2022-09-29 DIAGNOSIS — E039 Hypothyroidism, unspecified: Secondary | ICD-10-CM

## 2022-09-29 LAB — RAD ONC ARIA SESSION SUMMARY
Course Elapsed Days: 0
Plan Fractions Treated to Date: 1
Plan Prescribed Dose Per Fraction: 2.66 Gy
Plan Total Fractions Prescribed: 16
Plan Total Prescribed Dose: 42.56 Gy
Reference Point Dosage Given to Date: 2.66 Gy
Reference Point Session Dosage Given: 2.66 Gy
Session Number: 1

## 2022-09-29 LAB — CBC WITH DIFFERENTIAL/PLATELET
Basophils Absolute: 0.1 10*3/uL (ref 0.0–0.2)
Basos: 2 %
EOS (ABSOLUTE): 0.2 10*3/uL (ref 0.0–0.4)
Eos: 4 %
Hematocrit: 35 % (ref 34.0–46.6)
Hemoglobin: 12 g/dL (ref 11.1–15.9)
Immature Grans (Abs): 0 10*3/uL (ref 0.0–0.1)
Immature Granulocytes: 0 %
Lymphocytes Absolute: 1.2 10*3/uL (ref 0.7–3.1)
Lymphs: 24 %
MCH: 29.6 pg (ref 26.6–33.0)
MCHC: 34.3 g/dL (ref 31.5–35.7)
MCV: 86 fL (ref 79–97)
Monocytes Absolute: 0.6 10*3/uL (ref 0.1–0.9)
Monocytes: 12 %
Neutrophils Absolute: 2.8 10*3/uL (ref 1.4–7.0)
Neutrophils: 58 %
Platelets: 302 10*3/uL (ref 150–450)
RBC: 4.06 x10E6/uL (ref 3.77–5.28)
RDW: 12.2 % (ref 11.7–15.4)
WBC: 4.8 10*3/uL (ref 3.4–10.8)

## 2022-09-29 LAB — COMPREHENSIVE METABOLIC PANEL
ALT: 12 IU/L (ref 0–32)
AST: 13 IU/L (ref 0–40)
Albumin: 4.2 g/dL (ref 3.6–4.6)
Alkaline Phosphatase: 64 IU/L (ref 44–121)
BUN/Creatinine Ratio: 19 (ref 12–28)
BUN: 17 mg/dL (ref 10–36)
Bilirubin Total: 0.4 mg/dL (ref 0.0–1.2)
CO2: 23 mmol/L (ref 20–29)
Calcium: 9.7 mg/dL (ref 8.7–10.3)
Chloride: 101 mmol/L (ref 96–106)
Creatinine, Ser: 0.9 mg/dL (ref 0.57–1.00)
Globulin, Total: 2.6 g/dL (ref 1.5–4.5)
Glucose: 110 mg/dL — ABNORMAL HIGH (ref 70–99)
Potassium: 4.3 mmol/L (ref 3.5–5.2)
Sodium: 139 mmol/L (ref 134–144)
Total Protein: 6.8 g/dL (ref 6.0–8.5)
eGFR: 60 mL/min/{1.73_m2} (ref >59)

## 2022-09-29 LAB — LIPID PANEL W/O CHOL/HDL RATIO
Cholesterol, Total: 161 mg/dL (ref 100–199)
HDL: 75 mg/dL (ref >39)
LDL Chol Calc (NIH): 75 mg/dL (ref 0–99)
Triglycerides: 54 mg/dL (ref 0–149)
VLDL Cholesterol Cal: 11 mg/dL (ref 5–40)

## 2022-09-29 LAB — TSH: TSH: 0.441 u[IU]/mL — ABNORMAL LOW (ref 0.450–4.500)

## 2022-09-29 MED ORDER — LEVOTHYROXINE SODIUM 100 MCG PO TABS: 100.0000 ug | ORAL_TABLET | Freq: Every day | ORAL | 1 refills | Status: DC

## 2022-10-01 ENCOUNTER — Other Ambulatory Visit: Payer: Self-pay

## 2022-10-01 ENCOUNTER — Ambulatory Visit
Admission: RE | Admit: 2022-10-01 | Discharge: 2022-10-01 | Disposition: A | Discharge status: Home / Self Care | Payer: PPO | Source: Ambulatory Visit | Attending: Radiation Oncology | Admitting: Radiation Oncology

## 2022-10-01 DIAGNOSIS — D0511 Intraductal carcinoma in situ of right breast: Secondary | ICD-10-CM | POA: Diagnosis not present

## 2022-10-01 DIAGNOSIS — Z510 Encounter for antineoplastic radiation therapy: Secondary | ICD-10-CM | POA: Diagnosis not present

## 2022-10-01 LAB — RAD ONC ARIA SESSION SUMMARY
Course Elapsed Days: 2
Plan Fractions Treated to Date: 2
Plan Prescribed Dose Per Fraction: 2.66 Gy
Plan Total Fractions Prescribed: 16
Plan Total Prescribed Dose: 42.56 Gy
Reference Point Dosage Given to Date: 5.32 Gy
Reference Point Session Dosage Given: 2.66 Gy
Session Number: 2

## 2022-10-04 ENCOUNTER — Ambulatory Visit
Admission: RE | Admit: 2022-10-04 | Discharge: 2022-10-04 | Disposition: A | Discharge status: Home / Self Care | Payer: PPO | Source: Ambulatory Visit | Attending: Radiation Oncology | Admitting: Radiation Oncology

## 2022-10-04 ENCOUNTER — Other Ambulatory Visit: Payer: Self-pay

## 2022-10-04 DIAGNOSIS — D0511 Intraductal carcinoma in situ of right breast: Secondary | ICD-10-CM | POA: Diagnosis not present

## 2022-10-04 DIAGNOSIS — Z510 Encounter for antineoplastic radiation therapy: Secondary | ICD-10-CM | POA: Diagnosis not present

## 2022-10-04 LAB — RAD ONC ARIA SESSION SUMMARY
Course Elapsed Days: 5
Plan Fractions Treated to Date: 3
Plan Prescribed Dose Per Fraction: 2.66 Gy
Plan Total Fractions Prescribed: 16
Plan Total Prescribed Dose: 42.56 Gy
Reference Point Dosage Given to Date: 7.98 Gy
Reference Point Session Dosage Given: 2.66 Gy
Session Number: 3

## 2022-10-05 ENCOUNTER — Other Ambulatory Visit: Payer: Self-pay

## 2022-10-05 ENCOUNTER — Ambulatory Visit
Admission: RE | Admit: 2022-10-05 | Discharge: 2022-10-05 | Disposition: A | Discharge status: Home / Self Care | Payer: PPO | Source: Ambulatory Visit | Attending: Radiation Oncology | Admitting: Radiation Oncology

## 2022-10-05 DIAGNOSIS — Z510 Encounter for antineoplastic radiation therapy: Secondary | ICD-10-CM | POA: Diagnosis not present

## 2022-10-05 DIAGNOSIS — D0511 Intraductal carcinoma in situ of right breast: Secondary | ICD-10-CM | POA: Diagnosis not present

## 2022-10-05 LAB — RAD ONC ARIA SESSION SUMMARY
Course Elapsed Days: 6
Plan Fractions Treated to Date: 4
Plan Prescribed Dose Per Fraction: 2.66 Gy
Plan Total Fractions Prescribed: 16
Plan Total Prescribed Dose: 42.56 Gy
Reference Point Dosage Given to Date: 10.64 Gy
Reference Point Session Dosage Given: 2.66 Gy
Session Number: 4

## 2022-10-06 ENCOUNTER — Inpatient Hospital Stay: Payer: PPO

## 2022-10-06 ENCOUNTER — Ambulatory Visit
Admission: RE | Admit: 2022-10-06 | Discharge: 2022-10-06 | Disposition: A | Discharge status: Home / Self Care | Payer: PPO | Source: Ambulatory Visit | Attending: Radiation Oncology | Admitting: Radiation Oncology

## 2022-10-06 ENCOUNTER — Other Ambulatory Visit: Payer: Self-pay

## 2022-10-06 DIAGNOSIS — D0511 Intraductal carcinoma in situ of right breast: Secondary | ICD-10-CM | POA: Insufficient documentation

## 2022-10-06 DIAGNOSIS — Z510 Encounter for antineoplastic radiation therapy: Secondary | ICD-10-CM | POA: Diagnosis not present

## 2022-10-06 LAB — CBC (CANCER CENTER ONLY)
HCT: 34.6 % — ABNORMAL LOW (ref 36.0–46.0)
Hemoglobin: 11.9 g/dL — ABNORMAL LOW (ref 12.0–15.0)
MCH: 30.1 pg (ref 26.0–34.0)
MCHC: 34.4 g/dL (ref 30.0–36.0)
MCV: 87.6 fL (ref 80.0–100.0)
Platelet Count: 256 10*3/uL (ref 150–400)
RBC: 3.95 MIL/uL (ref 3.87–5.11)
RDW: 13.2 % (ref 11.5–15.5)
WBC Count: 4.8 10*3/uL (ref 4.0–10.5)
nRBC: 0 % (ref 0.0–0.2)

## 2022-10-06 LAB — RAD ONC ARIA SESSION SUMMARY
Course Elapsed Days: 7
Plan Fractions Treated to Date: 5
Plan Prescribed Dose Per Fraction: 2.66 Gy
Plan Total Fractions Prescribed: 16
Plan Total Prescribed Dose: 42.56 Gy
Reference Point Dosage Given to Date: 13.3 Gy
Reference Point Session Dosage Given: 2.66 Gy
Session Number: 5

## 2022-10-07 ENCOUNTER — Ambulatory Visit
Admission: RE | Admit: 2022-10-07 | Discharge: 2022-10-07 | Disposition: A | Discharge status: Home / Self Care | Payer: PPO | Source: Ambulatory Visit | Attending: Radiation Oncology | Admitting: Radiation Oncology

## 2022-10-07 ENCOUNTER — Other Ambulatory Visit: Payer: Self-pay

## 2022-10-07 DIAGNOSIS — D0511 Intraductal carcinoma in situ of right breast: Secondary | ICD-10-CM | POA: Diagnosis not present

## 2022-10-07 DIAGNOSIS — Z510 Encounter for antineoplastic radiation therapy: Secondary | ICD-10-CM | POA: Diagnosis not present

## 2022-10-07 LAB — RAD ONC ARIA SESSION SUMMARY
Course Elapsed Days: 8
Plan Fractions Treated to Date: 6
Plan Prescribed Dose Per Fraction: 2.66 Gy
Plan Total Fractions Prescribed: 16
Plan Total Prescribed Dose: 42.56 Gy
Reference Point Dosage Given to Date: 15.96 Gy
Reference Point Session Dosage Given: 2.66 Gy
Session Number: 6

## 2022-10-08 ENCOUNTER — Other Ambulatory Visit: Payer: Self-pay

## 2022-10-08 ENCOUNTER — Ambulatory Visit: Admission: RE | Admit: 2022-10-08 | Payer: PPO | Source: Ambulatory Visit

## 2022-10-08 DIAGNOSIS — Z510 Encounter for antineoplastic radiation therapy: Secondary | ICD-10-CM | POA: Diagnosis not present

## 2022-10-08 DIAGNOSIS — D0511 Intraductal carcinoma in situ of right breast: Secondary | ICD-10-CM | POA: Diagnosis not present

## 2022-10-08 LAB — RAD ONC ARIA SESSION SUMMARY
Course Elapsed Days: 9
Plan Fractions Treated to Date: 7
Plan Prescribed Dose Per Fraction: 2.66 Gy
Plan Total Fractions Prescribed: 16
Plan Total Prescribed Dose: 42.56 Gy
Reference Point Dosage Given to Date: 18.62 Gy
Reference Point Session Dosage Given: 2.66 Gy
Session Number: 7

## 2022-10-11 ENCOUNTER — Ambulatory Visit: Admission: RE | Admit: 2022-10-11 | Payer: PPO | Source: Ambulatory Visit

## 2022-10-11 ENCOUNTER — Other Ambulatory Visit: Payer: Self-pay

## 2022-10-11 DIAGNOSIS — D0511 Intraductal carcinoma in situ of right breast: Secondary | ICD-10-CM | POA: Diagnosis not present

## 2022-10-11 DIAGNOSIS — Z510 Encounter for antineoplastic radiation therapy: Secondary | ICD-10-CM | POA: Diagnosis not present

## 2022-10-11 LAB — RAD ONC ARIA SESSION SUMMARY
Course Elapsed Days: 12
Plan Fractions Treated to Date: 8
Plan Prescribed Dose Per Fraction: 2.66 Gy
Plan Total Fractions Prescribed: 16
Plan Total Prescribed Dose: 42.56 Gy
Reference Point Dosage Given to Date: 21.28 Gy
Reference Point Session Dosage Given: 2.66 Gy
Session Number: 8

## 2022-10-12 ENCOUNTER — Ambulatory Visit
Admission: RE | Admit: 2022-10-12 | Discharge: 2022-10-12 | Disposition: A | Discharge status: Home / Self Care | Payer: PPO | Source: Ambulatory Visit | Attending: Radiation Oncology | Admitting: Radiation Oncology

## 2022-10-12 ENCOUNTER — Other Ambulatory Visit: Payer: Self-pay

## 2022-10-12 DIAGNOSIS — D0511 Intraductal carcinoma in situ of right breast: Secondary | ICD-10-CM | POA: Diagnosis not present

## 2022-10-12 DIAGNOSIS — Z510 Encounter for antineoplastic radiation therapy: Secondary | ICD-10-CM | POA: Diagnosis not present

## 2022-10-12 LAB — RAD ONC ARIA SESSION SUMMARY
Course Elapsed Days: 13
Plan Fractions Treated to Date: 9
Plan Prescribed Dose Per Fraction: 2.66 Gy
Plan Total Fractions Prescribed: 16
Plan Total Prescribed Dose: 42.56 Gy
Reference Point Dosage Given to Date: 23.94 Gy
Reference Point Session Dosage Given: 2.66 Gy
Session Number: 9

## 2022-10-13 ENCOUNTER — Other Ambulatory Visit: Payer: Self-pay

## 2022-10-13 ENCOUNTER — Telehealth: Payer: Self-pay | Admitting: Family Medicine

## 2022-10-13 ENCOUNTER — Ambulatory Visit
Admission: RE | Admit: 2022-10-13 | Discharge: 2022-10-13 | Disposition: A | Discharge status: Home / Self Care | Payer: PPO | Source: Ambulatory Visit | Attending: Radiation Oncology | Admitting: Radiation Oncology

## 2022-10-13 DIAGNOSIS — D0511 Intraductal carcinoma in situ of right breast: Secondary | ICD-10-CM | POA: Diagnosis not present

## 2022-10-13 DIAGNOSIS — Z510 Encounter for antineoplastic radiation therapy: Secondary | ICD-10-CM | POA: Diagnosis not present

## 2022-10-13 LAB — RAD ONC ARIA SESSION SUMMARY
Course Elapsed Days: 14
Plan Fractions Treated to Date: 10
Plan Prescribed Dose Per Fraction: 2.66 Gy
Plan Total Fractions Prescribed: 16
Plan Total Prescribed Dose: 42.56 Gy
Reference Point Dosage Given to Date: 26.6 Gy
Reference Point Session Dosage Given: 2.66 Gy
Session Number: 10

## 2022-10-13 NOTE — Telephone Encounter (Signed)
Pt stated she spoke with Tamera Punt, who advised her that Dr. Laural Benes had changed her medication, levothyroxine (SYNTHROID) from to . She stated that a 30-day supply was sent in that she started on July 10. Pt is concerned she will run out of medication as her lab appointment is on 8/13.  Was unable to tell pt she had a refill on medication. Called back multiple times to let her know a refill was sent no answer.  FYI to office.   Please advise.

## 2022-10-14 ENCOUNTER — Ambulatory Visit
Admission: RE | Admit: 2022-10-14 | Discharge: 2022-10-14 | Disposition: A | Discharge status: Home / Self Care | Payer: PPO | Source: Ambulatory Visit | Attending: Radiation Oncology | Admitting: Radiation Oncology

## 2022-10-14 ENCOUNTER — Other Ambulatory Visit: Payer: Self-pay

## 2022-10-14 DIAGNOSIS — Z510 Encounter for antineoplastic radiation therapy: Secondary | ICD-10-CM | POA: Diagnosis not present

## 2022-10-14 DIAGNOSIS — D0511 Intraductal carcinoma in situ of right breast: Secondary | ICD-10-CM | POA: Diagnosis not present

## 2022-10-14 LAB — RAD ONC ARIA SESSION SUMMARY
Course Elapsed Days: 15
Plan Fractions Treated to Date: 11
Plan Prescribed Dose Per Fraction: 2.66 Gy
Plan Total Fractions Prescribed: 16
Plan Total Prescribed Dose: 42.56 Gy
Reference Point Dosage Given to Date: 29.26 Gy
Reference Point Session Dosage Given: 2.66 Gy
Session Number: 11

## 2022-10-14 NOTE — Telephone Encounter (Signed)
Spoke with patient and  explained that she had an additional refill for the SYNTHROID  which will last her until her next appt. Also informed her when her next appoint will be. Patient expressed understanding.

## 2022-10-15 ENCOUNTER — Ambulatory Visit: Payer: PPO

## 2022-10-16 DIAGNOSIS — Z510 Encounter for antineoplastic radiation therapy: Secondary | ICD-10-CM | POA: Diagnosis not present

## 2022-10-16 DIAGNOSIS — D0511 Intraductal carcinoma in situ of right breast: Secondary | ICD-10-CM | POA: Diagnosis not present

## 2022-10-18 ENCOUNTER — Ambulatory Visit
Admission: RE | Admit: 2022-10-18 | Discharge: 2022-10-18 | Disposition: A | Discharge status: Home / Self Care | Payer: PPO | Source: Ambulatory Visit | Attending: Radiation Oncology | Admitting: Radiation Oncology

## 2022-10-18 ENCOUNTER — Other Ambulatory Visit: Payer: Self-pay

## 2022-10-18 DIAGNOSIS — D0511 Intraductal carcinoma in situ of right breast: Secondary | ICD-10-CM | POA: Diagnosis not present

## 2022-10-18 DIAGNOSIS — Z510 Encounter for antineoplastic radiation therapy: Secondary | ICD-10-CM | POA: Diagnosis not present

## 2022-10-18 LAB — RAD ONC ARIA SESSION SUMMARY
Course Elapsed Days: 19
Plan Fractions Treated to Date: 12
Plan Prescribed Dose Per Fraction: 2.66 Gy
Plan Total Fractions Prescribed: 16
Plan Total Prescribed Dose: 42.56 Gy
Reference Point Dosage Given to Date: 31.92 Gy
Reference Point Session Dosage Given: 2.66 Gy
Session Number: 12

## 2022-10-19 ENCOUNTER — Ambulatory Visit
Admission: RE | Admit: 2022-10-19 | Discharge: 2022-10-19 | Disposition: A | Discharge status: Home / Self Care | Payer: PPO | Source: Ambulatory Visit | Attending: Radiation Oncology | Admitting: Radiation Oncology

## 2022-10-19 ENCOUNTER — Other Ambulatory Visit: Payer: Self-pay

## 2022-10-19 DIAGNOSIS — D0511 Intraductal carcinoma in situ of right breast: Secondary | ICD-10-CM | POA: Diagnosis not present

## 2022-10-19 DIAGNOSIS — Z510 Encounter for antineoplastic radiation therapy: Secondary | ICD-10-CM | POA: Diagnosis not present

## 2022-10-19 LAB — RAD ONC ARIA SESSION SUMMARY
Course Elapsed Days: 20
Plan Fractions Treated to Date: 13
Plan Prescribed Dose Per Fraction: 2.66 Gy
Plan Total Fractions Prescribed: 16
Plan Total Prescribed Dose: 42.56 Gy
Reference Point Dosage Given to Date: 34.58 Gy
Reference Point Session Dosage Given: 2.66 Gy
Session Number: 13

## 2022-10-20 ENCOUNTER — Other Ambulatory Visit: Payer: Self-pay

## 2022-10-20 ENCOUNTER — Inpatient Hospital Stay: Payer: PPO

## 2022-10-20 ENCOUNTER — Ambulatory Visit
Admission: RE | Admit: 2022-10-20 | Discharge: 2022-10-20 | Disposition: A | Discharge status: Home / Self Care | Payer: PPO | Source: Ambulatory Visit | Attending: Radiation Oncology | Admitting: Radiation Oncology

## 2022-10-20 DIAGNOSIS — Z510 Encounter for antineoplastic radiation therapy: Secondary | ICD-10-CM | POA: Diagnosis not present

## 2022-10-20 DIAGNOSIS — D0511 Intraductal carcinoma in situ of right breast: Secondary | ICD-10-CM

## 2022-10-20 LAB — RAD ONC ARIA SESSION SUMMARY
Course Elapsed Days: 21
Plan Fractions Treated to Date: 14
Plan Prescribed Dose Per Fraction: 2.66 Gy
Plan Total Fractions Prescribed: 16
Plan Total Prescribed Dose: 42.56 Gy
Reference Point Dosage Given to Date: 37.24 Gy
Reference Point Session Dosage Given: 2.66 Gy
Session Number: 14

## 2022-10-20 LAB — CBC (CANCER CENTER ONLY)
HCT: 34.8 % — ABNORMAL LOW (ref 36.0–46.0)
Hemoglobin: 11.8 g/dL — ABNORMAL LOW (ref 12.0–15.0)
MCH: 29.7 pg (ref 26.0–34.0)
MCHC: 33.9 g/dL (ref 30.0–36.0)
MCV: 87.7 fL (ref 80.0–100.0)
Platelet Count: 259 10*3/uL (ref 150–400)
RBC: 3.97 MIL/uL (ref 3.87–5.11)
RDW: 13.3 % (ref 11.5–15.5)
WBC Count: 4.6 10*3/uL (ref 4.0–10.5)
nRBC: 0 % (ref 0.0–0.2)

## 2022-10-21 ENCOUNTER — Ambulatory Visit: Payer: PPO

## 2022-10-21 ENCOUNTER — Other Ambulatory Visit: Payer: Self-pay

## 2022-10-21 ENCOUNTER — Ambulatory Visit
Admission: RE | Admit: 2022-10-21 | Discharge: 2022-10-21 | Disposition: A | Discharge status: Home / Self Care | Payer: PPO | Source: Ambulatory Visit | Attending: Radiation Oncology | Admitting: Radiation Oncology

## 2022-10-21 DIAGNOSIS — Z510 Encounter for antineoplastic radiation therapy: Secondary | ICD-10-CM | POA: Diagnosis not present

## 2022-10-21 DIAGNOSIS — D0511 Intraductal carcinoma in situ of right breast: Secondary | ICD-10-CM | POA: Diagnosis not present

## 2022-10-21 LAB — RAD ONC ARIA SESSION SUMMARY
Course Elapsed Days: 22
Plan Fractions Treated to Date: 15
Plan Prescribed Dose Per Fraction: 2.66 Gy
Plan Total Fractions Prescribed: 16
Plan Total Prescribed Dose: 42.56 Gy
Reference Point Dosage Given to Date: 39.9 Gy
Reference Point Session Dosage Given: 2.66 Gy
Session Number: 15

## 2022-10-22 ENCOUNTER — Ambulatory Visit: Admission: RE | Admit: 2022-10-22 | Payer: PPO | Source: Ambulatory Visit

## 2022-10-22 ENCOUNTER — Other Ambulatory Visit: Payer: Self-pay

## 2022-10-22 ENCOUNTER — Ambulatory Visit: Payer: PPO

## 2022-10-22 DIAGNOSIS — D0511 Intraductal carcinoma in situ of right breast: Secondary | ICD-10-CM | POA: Diagnosis not present

## 2022-10-22 DIAGNOSIS — Z510 Encounter for antineoplastic radiation therapy: Secondary | ICD-10-CM | POA: Diagnosis not present

## 2022-10-22 LAB — RAD ONC ARIA SESSION SUMMARY
Course Elapsed Days: 23
Plan Fractions Treated to Date: 16
Plan Prescribed Dose Per Fraction: 2.66 Gy
Plan Total Fractions Prescribed: 16
Plan Total Prescribed Dose: 42.56 Gy
Reference Point Dosage Given to Date: 42.56 Gy
Reference Point Session Dosage Given: 2.66 Gy
Session Number: 16

## 2022-10-25 ENCOUNTER — Ambulatory Visit: Payer: PPO

## 2022-10-25 ENCOUNTER — Other Ambulatory Visit: Payer: Self-pay

## 2022-10-25 ENCOUNTER — Ambulatory Visit: Admission: RE | Admit: 2022-10-25 | Payer: PPO | Source: Ambulatory Visit

## 2022-10-25 DIAGNOSIS — Z510 Encounter for antineoplastic radiation therapy: Secondary | ICD-10-CM | POA: Diagnosis not present

## 2022-10-25 LAB — RAD ONC ARIA SESSION SUMMARY
Course Elapsed Days: 26
Plan Fractions Treated to Date: 1
Plan Prescribed Dose Per Fraction: 2 Gy
Plan Total Fractions Prescribed: 8
Plan Total Prescribed Dose: 16 Gy
Reference Point Dosage Given to Date: 2 Gy
Reference Point Session Dosage Given: 2 Gy
Session Number: 17

## 2022-10-26 ENCOUNTER — Other Ambulatory Visit: Payer: Self-pay

## 2022-10-26 ENCOUNTER — Ambulatory Visit
Admission: RE | Admit: 2022-10-26 | Discharge: 2022-10-26 | Disposition: A | Discharge status: Home / Self Care | Payer: PPO | Source: Ambulatory Visit | Attending: Radiation Oncology | Admitting: Radiation Oncology

## 2022-10-26 DIAGNOSIS — Z510 Encounter for antineoplastic radiation therapy: Secondary | ICD-10-CM | POA: Diagnosis not present

## 2022-10-26 LAB — RAD ONC ARIA SESSION SUMMARY
Course Elapsed Days: 27
Plan Fractions Treated to Date: 2
Plan Prescribed Dose Per Fraction: 2 Gy
Plan Total Fractions Prescribed: 8
Plan Total Prescribed Dose: 16 Gy
Reference Point Dosage Given to Date: 4 Gy
Reference Point Session Dosage Given: 2 Gy
Session Number: 18

## 2022-10-27 ENCOUNTER — Other Ambulatory Visit: Payer: Self-pay | Admitting: Family Medicine

## 2022-10-27 ENCOUNTER — Ambulatory Visit
Admission: RE | Admit: 2022-10-27 | Discharge: 2022-10-27 | Disposition: A | Discharge status: Home / Self Care | Payer: PPO | Source: Ambulatory Visit | Attending: Radiation Oncology | Admitting: Radiation Oncology

## 2022-10-27 ENCOUNTER — Other Ambulatory Visit: Payer: Self-pay

## 2022-10-27 DIAGNOSIS — Z510 Encounter for antineoplastic radiation therapy: Secondary | ICD-10-CM | POA: Diagnosis not present

## 2022-10-27 DIAGNOSIS — E039 Hypothyroidism, unspecified: Secondary | ICD-10-CM

## 2022-10-27 LAB — RAD ONC ARIA SESSION SUMMARY
Course Elapsed Days: 28
Plan Fractions Treated to Date: 3
Plan Prescribed Dose Per Fraction: 2 Gy
Plan Total Fractions Prescribed: 8
Plan Total Prescribed Dose: 16 Gy
Reference Point Dosage Given to Date: 6 Gy
Reference Point Session Dosage Given: 2 Gy
Session Number: 19

## 2022-10-28 ENCOUNTER — Ambulatory Visit
Admission: RE | Admit: 2022-10-28 | Discharge: 2022-10-28 | Disposition: A | Discharge status: Home / Self Care | Payer: PPO | Source: Ambulatory Visit | Attending: Radiation Oncology | Admitting: Radiation Oncology

## 2022-10-28 ENCOUNTER — Other Ambulatory Visit: Payer: Self-pay

## 2022-10-28 DIAGNOSIS — Z510 Encounter for antineoplastic radiation therapy: Secondary | ICD-10-CM | POA: Insufficient documentation

## 2022-10-28 DIAGNOSIS — D0511 Intraductal carcinoma in situ of right breast: Secondary | ICD-10-CM | POA: Diagnosis not present

## 2022-10-28 LAB — RAD ONC ARIA SESSION SUMMARY
Course Elapsed Days: 29
Plan Fractions Treated to Date: 4
Plan Prescribed Dose Per Fraction: 2 Gy
Plan Total Fractions Prescribed: 8
Plan Total Prescribed Dose: 16 Gy
Reference Point Dosage Given to Date: 8 Gy
Reference Point Session Dosage Given: 2 Gy
Session Number: 20

## 2022-10-28 NOTE — Telephone Encounter (Signed)
Requested medication (s) are due for refill today: No  Requested medication (s) are on the active medication list: Yes  Last refill:  09/29/22  Future visit scheduled: No  Notes to clinic:  Pharmacy requests 90 day supply and diagnosis code.     Requested Prescriptions  Pending Prescriptions Disp Refills   levothyroxine (SYNTHROID) 100 MCG tablet [Pharmacy Med Name: LEVOTHYROXINE 100 MCG TABLET] 90 tablet 1    Sig: TAKE 1 TABLET BY MOUTH DAILY BEFORE BREAKFAST.     Endocrinology:  Hypothyroid Agents Failed - 10/27/2022  1:34 PM      Failed - TSH in normal range and within 360 days    TSH  Date Value Ref Range Status  09/28/2022 0.441 (L) 0.450 - 4.500 uIU/mL Final         Passed - Valid encounter within last 12 months    Recent Outpatient Visits           1 month ago Benign hypertensive renal disease   [facility] [facility] [place], Megan P, DO   4 months ago Balance problem   [facility] [place], Megan P, DO   7 months ago Routine general medical examination at a health care facility   [facility] [place] P, DO   10 months ago Hypothyroidism, unspecified type   [place] [facility] [place], Megan P, DO   1 year ago Hypothyroidism, unspecified type   [place] [facility] Dorcas Carrow, DO       Future Appointments             In 2 months Laural Benes, Oralia Rud, DO [place] [facility]

## 2022-10-29 ENCOUNTER — Ambulatory Visit
Admission: RE | Admit: 2022-10-29 | Discharge: 2022-10-29 | Disposition: A | Discharge status: Home / Self Care | Payer: PPO | Source: Ambulatory Visit | Attending: Radiation Oncology | Admitting: Radiation Oncology

## 2022-10-29 ENCOUNTER — Other Ambulatory Visit: Payer: Self-pay

## 2022-10-29 DIAGNOSIS — Z510 Encounter for antineoplastic radiation therapy: Secondary | ICD-10-CM | POA: Diagnosis not present

## 2022-10-29 LAB — RAD ONC ARIA SESSION SUMMARY
Course Elapsed Days: 30
Plan Fractions Treated to Date: 5
Plan Prescribed Dose Per Fraction: 2 Gy
Plan Total Fractions Prescribed: 8
Plan Total Prescribed Dose: 16 Gy
Reference Point Dosage Given to Date: 10 Gy
Reference Point Session Dosage Given: 2 Gy
Session Number: 21

## 2022-11-01 ENCOUNTER — Ambulatory Visit
Admission: RE | Admit: 2022-11-01 | Discharge: 2022-11-01 | Disposition: A | Discharge status: Home / Self Care | Payer: PPO | Source: Ambulatory Visit | Attending: Radiation Oncology | Admitting: Radiation Oncology

## 2022-11-01 ENCOUNTER — Other Ambulatory Visit: Payer: Self-pay

## 2022-11-01 DIAGNOSIS — Z510 Encounter for antineoplastic radiation therapy: Secondary | ICD-10-CM | POA: Diagnosis not present

## 2022-11-01 LAB — RAD ONC ARIA SESSION SUMMARY
Course Elapsed Days: 33
Plan Fractions Treated to Date: 6
Plan Prescribed Dose Per Fraction: 2 Gy
Plan Total Fractions Prescribed: 8
Plan Total Prescribed Dose: 16 Gy
Reference Point Dosage Given to Date: 12 Gy
Reference Point Session Dosage Given: 2 Gy
Session Number: 22

## 2022-11-02 ENCOUNTER — Ambulatory Visit: Payer: PPO

## 2022-11-02 ENCOUNTER — Ambulatory Visit: Payer: PPO | Admitting: Podiatry

## 2022-11-02 ENCOUNTER — Other Ambulatory Visit: Payer: Self-pay

## 2022-11-02 ENCOUNTER — Ambulatory Visit
Admission: RE | Admit: 2022-11-02 | Discharge: 2022-11-02 | Disposition: A | Discharge status: Home / Self Care | Payer: PPO | Source: Ambulatory Visit | Attending: Radiation Oncology | Admitting: Radiation Oncology

## 2022-11-02 DIAGNOSIS — Z510 Encounter for antineoplastic radiation therapy: Secondary | ICD-10-CM | POA: Diagnosis not present

## 2022-11-02 LAB — RAD ONC ARIA SESSION SUMMARY
Course Elapsed Days: 34
Plan Fractions Treated to Date: 7
Plan Prescribed Dose Per Fraction: 2 Gy
Plan Total Fractions Prescribed: 8
Plan Total Prescribed Dose: 16 Gy
Reference Point Dosage Given to Date: 14 Gy
Reference Point Session Dosage Given: 2 Gy
Session Number: 23

## 2022-11-03 ENCOUNTER — Encounter: Payer: Self-pay | Admitting: *Deleted

## 2022-11-03 ENCOUNTER — Ambulatory Visit
Admission: RE | Admit: 2022-11-03 | Discharge: 2022-11-03 | Disposition: A | Discharge status: Home / Self Care | Payer: PPO | Source: Ambulatory Visit | Attending: Radiation Oncology | Admitting: Radiation Oncology

## 2022-11-03 ENCOUNTER — Other Ambulatory Visit: Payer: Self-pay

## 2022-11-03 DIAGNOSIS — D0511 Intraductal carcinoma in situ of right breast: Secondary | ICD-10-CM | POA: Diagnosis not present

## 2022-11-03 DIAGNOSIS — Z510 Encounter for antineoplastic radiation therapy: Secondary | ICD-10-CM | POA: Diagnosis not present

## 2022-11-03 LAB — RAD ONC ARIA SESSION SUMMARY
Course Elapsed Days: 35
Plan Fractions Treated to Date: 8
Plan Prescribed Dose Per Fraction: 2 Gy
Plan Total Fractions Prescribed: 8
Plan Total Prescribed Dose: 16 Gy
Reference Point Dosage Given to Date: 16 Gy
Reference Point Session Dosage Given: 2 Gy
Session Number: 24

## 2022-11-03 NOTE — Telephone Encounter (Signed)
Patient had a Lab appointment missed call  Copied from CRM [id number]. Topic: General - Inquiry >> Nov 03, 2022  3:19 PM Cassie Brewer wrote: Patient called and stated she had a missed call but no voicemail, the call came yesterday on 11/02/2022, patient was not sure if it was an appt reminder call for her lab visit 11/09/2022. Please advise

## 2022-11-09 ENCOUNTER — Other Ambulatory Visit: Payer: PPO

## 2022-11-09 DIAGNOSIS — E039 Hypothyroidism, unspecified: Secondary | ICD-10-CM | POA: Diagnosis not present

## 2022-11-10 ENCOUNTER — Encounter: Payer: Self-pay | Admitting: Family Medicine

## 2022-11-10 ENCOUNTER — Other Ambulatory Visit: Payer: Self-pay | Admitting: Family Medicine

## 2022-11-10 DIAGNOSIS — E039 Hypothyroidism, unspecified: Secondary | ICD-10-CM

## 2022-11-10 MED ORDER — LEVOTHYROXINE SODIUM 100 MCG PO TABS
100.0000 ug | ORAL_TABLET | Freq: Every day | ORAL | 3 refills | Status: DC
Start: 2022-11-10 — End: 2023-04-06

## 2022-11-11 ENCOUNTER — Ambulatory Visit: Payer: PPO | Admitting: Podiatry

## 2022-11-16 ENCOUNTER — Ambulatory Visit: Payer: PPO | Admitting: Podiatry

## 2022-11-16 DIAGNOSIS — B351 Tinea unguium: Secondary | ICD-10-CM

## 2022-11-16 DIAGNOSIS — M79675 Pain in left toe(s): Secondary | ICD-10-CM | POA: Diagnosis not present

## 2022-11-16 DIAGNOSIS — M79674 Pain in right toe(s): Secondary | ICD-10-CM

## 2022-11-16 NOTE — Progress Notes (Signed)
  Subjective:  Patient ID: Cassie Brewer, female    DOB: [birth year],  MRN: [id number]  Chief Complaint  Patient presents with   Nail Problem    Nail trim   89 y.o. female returns for the above complaint.  Patient presents with thickened elongated mycotic toenails x10.  Patient is especially concerned for right hallux onychomycosis.  Patient not able to debride on herself.  She would like for [place] to debride down.  She does not have any secondary complaints. Objective:   There were no vitals filed for this visit. Podiatric Exam: Vascular: dorsalis pedis and posterior tibial pulses are palpable bilateral. Capillary return is immediate. Temperature gradient is WNL. Skin turgor WNL  Sensorium: Normal Semmes Weinstein monofilament test. Normal tactile sensation bilaterally.  However she experiences subjective neuropathic pain to the distal tip of the toes.  Intact sensation Nail Exam: Pt has thick disfigured discolored nails with subungual debris noted bilateral entire nail hallux through fifth toenails.  Ingrown noted to the right hallux lateral border.  Mild pain on palpation. Ulcer Exam: There is no evidence of ulcer or pre-ulcerative changes or infection. Orthopedic Exam: Muscle tone and strength are WNL. No limitations in general ROM. No crepitus or effusions noted. HAV  B/L.  Hammer toes 2-5  B/L. Skin: No Porokeratosis. No infection or ulcers.  Xerosis/psoriasis of the plantar foot with now subjective component of itching..  There is mild cracking noted.  No bleeding noted.  Assessment & Plan:  Patient was evaluated and treated and all questions answered.  Athlete's foot bilateral -Explained to patient the etiology of athlete's foot and various treatment options were discussed.  Given that there is subjective complaint of itching associated with the bottom of the foot I believe she will benefit from Lotrisone cream.  I have asked her to apply twice a day.  She states  understanding.  Neuropathic pain -doing well  Xerosis/psoriasis -I explained to the patient the etiology of dryness with cracking as well as various treatment options associated with it.  Given that she is doing good with clotrimazole cream that was given to her by dermatologist and had diagnosed her with psoriasis, I believe she can continue using the cream.  Patient agrees with this plan.  Onychomycosis with pain  -Nails palliatively debrided as below. -Educated on self-care  Procedure: Nail Debridement Rationale: pain  Type of Debridement: manual, sharp debridement. Instrumentation: Nail nipper, rotary burr. Number of Nails: 10  Procedures and Treatment: Consent by patient was obtained for treatment procedures. The patient understood the discussion of treatment and procedures well. All questions were answered thoroughly reviewed. Debridement of mycotic and hypertrophic toenails, 1 through 5 bilateral and clearing of subungual debris. No ulceration, no infection noted.  Return Visit-Office Procedure: Patient instructed to return to the office for a follow up visit 3 months for continued evaluation and treatment.  Nicholes Rough, DPM    No follow-ups on file.

## 2022-11-24 DIAGNOSIS — Z8673 Personal history of transient ischemic attack (TIA), and cerebral infarction without residual deficits: Secondary | ICD-10-CM | POA: Diagnosis not present

## 2022-11-24 DIAGNOSIS — G44209 Tension-type headache, unspecified, not intractable: Secondary | ICD-10-CM | POA: Diagnosis not present

## 2022-11-24 DIAGNOSIS — R4701 Aphasia: Secondary | ICD-10-CM | POA: Diagnosis not present

## 2022-11-24 DIAGNOSIS — R4781 Slurred speech: Secondary | ICD-10-CM | POA: Diagnosis not present

## 2022-12-02 ENCOUNTER — Other Ambulatory Visit: Payer: Self-pay | Admitting: Family Medicine

## 2022-12-02 DIAGNOSIS — E039 Hypothyroidism, unspecified: Secondary | ICD-10-CM

## 2022-12-03 NOTE — Telephone Encounter (Signed)
Unable to refill per protocol, Rx expired. Discontinued 12/15/21, dose change.  Requested Prescriptions  Pending Prescriptions Disp Refills   levothyroxine (SYNTHROID) 112 MCG tablet [Pharmacy Med Name: LEVOTHYROXINE 112 MCG TABLET] 90 tablet 0    Sig: TAKE 1 TABLET BY MOUTH EVERY DAY BEFORE BREAKFAST     Endocrinology:  Hypothyroid Agents Passed - 12/02/2022  9:15 AM      Passed - TSH in normal range and within 360 days    TSH  Date Value Ref Range Status  11/09/2022 1.870 0.450 - 4.500 uIU/mL Final         Passed - Valid encounter within last 12 months    Recent Outpatient Visits           2 months ago Benign hypertensive renal disease   [facility] [place], Megan P, DO   5 months ago Balance problem   [place] [facility] [place], Megan P, DO   8 months ago Routine general medical examination at a health care facility   [facility], Megan P, DO   11 months ago Hypothyroidism, unspecified type   [place] [facility] [place], Megan P, DO   1 year ago Hypothyroidism, unspecified type   [facility] Dorcas Carrow, DO       Future Appointments             In 3 weeks Laural Benes, Oralia Rud, DO [place] [facility]

## 2022-12-09 ENCOUNTER — Ambulatory Visit
Admission: RE | Admit: 2022-12-09 | Discharge: 2022-12-09 | Disposition: A | Discharge status: Home / Self Care | Payer: PPO | Source: Ambulatory Visit | Attending: Radiation Oncology | Admitting: Radiation Oncology

## 2022-12-09 ENCOUNTER — Encounter: Payer: Self-pay | Admitting: Radiation Oncology

## 2022-12-09 VITALS — BP 119/81 | HR 72 | Temp 98.3°F | Resp 16 | Wt 150.0 lb

## 2022-12-09 DIAGNOSIS — Z923 Personal history of irradiation: Secondary | ICD-10-CM | POA: Insufficient documentation

## 2022-12-09 DIAGNOSIS — Z170 Estrogen receptor positive status [ER+]: Secondary | ICD-10-CM | POA: Diagnosis not present

## 2022-12-09 DIAGNOSIS — D0511 Intraductal carcinoma in situ of right breast: Secondary | ICD-10-CM | POA: Diagnosis not present

## 2022-12-09 NOTE — Progress Notes (Signed)
Radiation Oncology Follow up Note  Name: Cassie Brewer   Date:   12/09/2022 MRN:  [id number] DOB: Sep 27, 1928    This 89 y.o. 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  Seen today in routine follow-up she is really doing well specifically denies breast tenderness cough or bone pain..  She is not on endocrine therapy.  COMPLICATIONS OF TREATMENT: none  FOLLOW UP COMPLIANCE: keeps appointments   PHYSICAL EXAM:  BP 119/81 (BP Location: Left Arm, Patient Position: Sitting)   Pulse 72   Temp 98.3 F (36.8 C) (Tympanic)   Resp 16   Wt 150 lb (68 kg)   LMP 05/13/1980   BMI 26.15 kg/m  Lungs are clear to A&P cardiac examination essentially unremarkable with regular rate and rhythm. No dominant mass or nodularity is noted in either breast in 2 positions examined. Incision is well-healed. No axillary or supraclavicular adenopathy is appreciated. Cosmetic result is excellent.  Well-developed well-nourished patient in NAD. HEENT reveals PERLA, EOMI, discs not visualized.  Oral cavity is clear. No oral mucosal lesions are identified. Neck is clear without evidence of cervical or supraclavicular adenopathy. Lungs are clear to A&P. Cardiac examination is essentially unremarkable with regular rate and rhythm without murmur rub or thrill. Abdomen is benign with no organomegaly or masses noted. Motor sensory and DTR levels are equal and symmetric in the upper and lower extremities. Cranial nerves II through XII are grossly intact. Proprioception is intact. No peripheral adenopathy or edema is identified. No motor or sensory levels are noted. Crude visual fields are within normal range.  RADIOLOGY  RESULTS: No current films for review  PLAN: Present time patient is doing extremely well 1 month out from whole breast radiation and pleased with her overall progress.  Of asked to see her back in 6 months for follow-up.  Patient knows to call at anytime with any concerns.  She continues close follow-up care with medical oncology.  I would like to take this opportunity to thank you for allowing me to participate in the care of your patient.Carmina Miller, MD

## 2022-12-15 DIAGNOSIS — Z08 Encounter for follow-up examination after completed treatment for malignant neoplasm: Secondary | ICD-10-CM | POA: Diagnosis not present

## 2022-12-15 DIAGNOSIS — D2261 Melanocytic nevi of right upper limb, including shoulder: Secondary | ICD-10-CM | POA: Diagnosis not present

## 2022-12-15 DIAGNOSIS — D2271 Melanocytic nevi of right lower limb, including hip: Secondary | ICD-10-CM | POA: Diagnosis not present

## 2022-12-15 DIAGNOSIS — D485 Neoplasm of uncertain behavior of skin: Secondary | ICD-10-CM | POA: Diagnosis not present

## 2022-12-15 DIAGNOSIS — L821 Other seborrheic keratosis: Secondary | ICD-10-CM | POA: Diagnosis not present

## 2022-12-15 DIAGNOSIS — D225 Melanocytic nevi of trunk: Secondary | ICD-10-CM | POA: Diagnosis not present

## 2022-12-15 DIAGNOSIS — D2272 Melanocytic nevi of left lower limb, including hip: Secondary | ICD-10-CM | POA: Diagnosis not present

## 2022-12-15 DIAGNOSIS — Z85828 Personal history of other malignant neoplasm of skin: Secondary | ICD-10-CM | POA: Diagnosis not present

## 2022-12-15 DIAGNOSIS — D2262 Melanocytic nevi of left upper limb, including shoulder: Secondary | ICD-10-CM | POA: Diagnosis not present

## 2022-12-15 DIAGNOSIS — D0439 Carcinoma in situ of skin of other parts of face: Secondary | ICD-10-CM | POA: Diagnosis not present

## 2022-12-29 ENCOUNTER — Encounter: Payer: Self-pay | Admitting: Family Medicine

## 2022-12-29 ENCOUNTER — Ambulatory Visit (INDEPENDENT_AMBULATORY_CARE_PROVIDER_SITE_OTHER): Payer: PPO | Admitting: Family Medicine

## 2022-12-29 VITALS — BP 136/80 | HR 60 | Ht 63.5 in | Wt 150.0 lb

## 2022-12-29 DIAGNOSIS — I129 Hypertensive chronic kidney disease with stage 1 through stage 4 chronic kidney disease, or unspecified chronic kidney disease: Secondary | ICD-10-CM | POA: Diagnosis not present

## 2022-12-29 DIAGNOSIS — E039 Hypothyroidism, unspecified: Secondary | ICD-10-CM | POA: Diagnosis not present

## 2022-12-29 DIAGNOSIS — Z23 Encounter for immunization: Secondary | ICD-10-CM | POA: Diagnosis not present

## 2022-12-29 DIAGNOSIS — D0511 Intraductal carcinoma in situ of right breast: Secondary | ICD-10-CM | POA: Diagnosis not present

## 2022-12-29 DIAGNOSIS — E785 Hyperlipidemia, unspecified: Secondary | ICD-10-CM | POA: Diagnosis not present

## 2022-12-29 NOTE — Assessment & Plan Note (Signed)
Under good control on current regimen. Continue current regimen. Continue to monitor. Call with any concerns. Refills up to date.   

## 2022-12-29 NOTE — Assessment & Plan Note (Signed)
Continue to follow with radiation oncology. Call with any concerns.

## 2022-12-29 NOTE — Progress Notes (Signed)
BP 136/80   Pulse 60   Ht 5' 3.5" (1.613 m)   Wt 150 lb (68 kg)   LMP 05/13/1980   SpO2 94%   BMI 26.15 kg/m    Subjective:    Patient ID: Cassie Brewer, female    DOB: March 18, 1929, 89 y.o.   MRN: [id number]  HPI: Cassie Brewer is a 89 y.o. female  Chief Complaint  Patient presents with   Hypothyroidism   Hypertension   HYPERTENSION / HYPERLIPIDEMIA Satisfied with current treatment? yes Duration of hypertension: chronic BP monitoring frequency: not checking BP medication side effects: no Past BP meds: benzapril, HCTZ Duration of hyperlipidemia: chronic Cholesterol medication side effects: no Cholesterol supplements: fish oil Past cholesterol medications: atorvastatin Medication compliance: excellent compliance Aspirin: yes Recent stressors: no Recurrent headaches: no Visual changes: no Palpitations: no Dyspnea: no Chest pain: no Lower extremity edema: no Dizzy/lightheaded: no  HYPOTHYROIDISM Thyroid control status:controlled Satisfied with current treatment? yes Medication side effects: no Medication compliance: excellent compliance Recent dose adjustment:no Fatigue: no Cold intolerance: no Heat intolerance: no Weight gain: no Weight loss: no Constipation: no Diarrhea/loose stools: no Palpitations: no Lower extremity edema: no Anxiety/depressed mood: no  Relevant past medical, surgical, family and social history reviewed and updated as indicated. Interim medical history since our last visit reviewed. Allergies and medications reviewed and updated.  Review of Systems  Constitutional: Negative.   Respiratory: Negative.    Cardiovascular: Negative.   Musculoskeletal: Negative.   Psychiatric/Behavioral: Negative.      Per HPI unless specifically indicated above     Objective:    BP 136/80   Pulse 60   Ht 5' 3.5" (1.613 m)   Wt 150 lb (68 kg)   LMP 05/13/1980   SpO2 94%   BMI 26.15 kg/m   Wt Readings from Last 3 Encounters:  12/29/22  150 lb (68 kg)  12/09/22 150 lb (68 kg)  09/28/22 150 lb 9.6 oz (68.3 kg)    Physical Exam Vitals and nursing note reviewed.  Constitutional:      General: She is not in acute distress.    Appearance: Normal appearance. She is not ill-appearing, toxic-appearing or diaphoretic.  HENT:     Head: Normocephalic and atraumatic.     Right Ear: External ear normal.     Left Ear: External ear normal.     Nose: Nose normal.     Mouth/Throat:     Mouth: Mucous membranes are moist.     Pharynx: Oropharynx is clear.  Eyes:     General: No scleral icterus.       Right eye: No discharge.        Left eye: No discharge.     Extraocular Movements: Extraocular movements intact.     Conjunctiva/sclera: Conjunctivae normal.     Pupils: Pupils are equal, round, and reactive to light.  Cardiovascular:     Rate and Rhythm: Normal rate and regular rhythm.     Pulses: Normal pulses.     Heart sounds: Normal heart sounds. No murmur heard.    No friction rub. No gallop.  Pulmonary:     Effort: Pulmonary effort is normal. No respiratory distress.     Breath sounds: Normal breath sounds. No stridor. No wheezing, rhonchi or rales.  Chest:     Chest wall: No tenderness.  Musculoskeletal:        General: Normal range of motion.     Cervical back: Normal range of motion and neck supple.  Skin:    General: Skin is warm and dry.     Capillary Refill: Capillary refill takes less than 2 seconds.     Coloration: Skin is not jaundiced or pale.     Findings: No bruising, erythema, lesion or rash.  Neurological:     General: No focal deficit present.     Mental Status: She is alert and oriented to person, place, and time. Mental status is at baseline.  Psychiatric:        Mood and Affect: Mood normal.        Behavior: Behavior normal.        Thought Content: Thought content normal.        Judgment: Judgment normal.     Results for orders placed or performed in visit on 11/09/22  TSH  Result Value Ref  Range   TSH 1.870 0.450 - 4.500 uIU/mL      Assessment & Plan:   Problem List Items Addressed This Visit       Endocrine   Hypothyroidism    Stable. Continue to monitor. Labs checked about a month ago. Call with any concerns.         Genitourinary   Benign hypertensive renal disease - Primary    Under good control on current regimen. Continue current regimen. Continue to monitor. Call with any concerns. Refills up to date.          Other   Hyperlipidemia    Under good control on current regimen. Continue current regimen. Continue to monitor. Call with any concerns. Refills up to date.       Ductal carcinoma in situ (DCIS) of right breast    Continue to follow with radiation oncology. Call with any concerns.       Other Visit Diagnoses     Needs flu shot       Flu shot given today.   Relevant Orders   Flu Vaccine Trivalent High Dose (Fluad) (Completed)        Follow up plan: Return in about 3 months (around 03/31/2023).

## 2022-12-29 NOTE — Assessment & Plan Note (Signed)
Stable. Continue to monitor. Labs checked about a month ago. Call with any concerns.

## 2023-03-01 ENCOUNTER — Ambulatory Visit: Payer: PPO | Admitting: Podiatry

## 2023-03-01 DIAGNOSIS — H401131 Primary open-angle glaucoma, bilateral, mild stage: Secondary | ICD-10-CM | POA: Diagnosis not present

## 2023-03-01 DIAGNOSIS — H353131 Nonexudative age-related macular degeneration, bilateral, early dry stage: Secondary | ICD-10-CM | POA: Diagnosis not present

## 2023-03-01 DIAGNOSIS — H26493 Other secondary cataract, bilateral: Secondary | ICD-10-CM | POA: Diagnosis not present

## 2023-03-03 ENCOUNTER — Other Ambulatory Visit: Payer: Self-pay | Admitting: Family Medicine

## 2023-03-03 DIAGNOSIS — N1831 Chronic kidney disease, stage 3a: Secondary | ICD-10-CM | POA: Diagnosis not present

## 2023-03-03 DIAGNOSIS — I10 Essential (primary) hypertension: Secondary | ICD-10-CM | POA: Diagnosis not present

## 2023-03-03 DIAGNOSIS — G459 Transient cerebral ischemic attack, unspecified: Secondary | ICD-10-CM | POA: Diagnosis not present

## 2023-03-03 DIAGNOSIS — E782 Mixed hyperlipidemia: Secondary | ICD-10-CM | POA: Diagnosis not present

## 2023-03-03 DIAGNOSIS — I499 Cardiac arrhythmia, unspecified: Secondary | ICD-10-CM | POA: Diagnosis not present

## 2023-03-03 DIAGNOSIS — I69320 Aphasia following cerebral infarction: Secondary | ICD-10-CM | POA: Diagnosis not present

## 2023-03-03 DIAGNOSIS — E039 Hypothyroidism, unspecified: Secondary | ICD-10-CM

## 2023-03-03 DIAGNOSIS — Z8673 Personal history of transient ischemic attack (TIA), and cerebral infarction without residual deficits: Secondary | ICD-10-CM | POA: Diagnosis not present

## 2023-03-03 DIAGNOSIS — R4781 Slurred speech: Secondary | ICD-10-CM | POA: Diagnosis not present

## 2023-03-03 DIAGNOSIS — R0609 Other forms of dyspnea: Secondary | ICD-10-CM | POA: Diagnosis not present

## 2023-03-04 NOTE — Telephone Encounter (Signed)
Discontinued Reorder Cassie Carrow, DO 09/29/22 1223         Requested Prescriptions  Refused Prescriptions Disp Refills   levothyroxine (SYNTHROID) 112 MCG tablet [Pharmacy Med Name: LEVOTHYROXINE 112 MCG TABLET] 90 tablet 3    Sig: TAKE 1 TABLET BY MOUTH DAILY BEFORE BREAKFAST.     Endocrinology:  Hypothyroid Agents Passed - 03/03/2023  8:29 AM      Passed - TSH in normal range and within 360 days    TSH  Date Value Ref Range Status  11/09/2022 1.870 0.450 - 4.500 uIU/mL Final         Passed - Valid encounter within last 12 months    Recent Outpatient Visits           2 months ago Benign hypertensive renal disease   [place] [place], Cassie P, DO   5 months ago Benign hypertensive renal disease   [place] [facility] [place], Cassie P, DO   8 months ago Balance problem   [place] [facility] [place], [place], DO   11 months ago Routine general medical examination at a health care facility   [facility] [place], [place] P, DO   1 year ago Hypothyroidism, unspecified type   [place] [facility] Cassie Carrow, DO       Future Appointments             In 1 month Cassie Brewer, Cassie Rud, DO [place] [facility]

## 2023-03-07 ENCOUNTER — Other Ambulatory Visit: Payer: Self-pay | Admitting: Family Medicine

## 2023-03-07 DIAGNOSIS — E039 Hypothyroidism, unspecified: Secondary | ICD-10-CM

## 2023-03-08 ENCOUNTER — Ambulatory Visit: Payer: PPO | Admitting: Podiatry

## 2023-03-08 ENCOUNTER — Encounter: Payer: Self-pay | Admitting: Podiatry

## 2023-03-08 VITALS — Ht 63.5 in | Wt 150.0 lb

## 2023-03-08 DIAGNOSIS — B351 Tinea unguium: Secondary | ICD-10-CM | POA: Diagnosis not present

## 2023-03-08 DIAGNOSIS — M79674 Pain in right toe(s): Secondary | ICD-10-CM

## 2023-03-08 DIAGNOSIS — M79675 Pain in left toe(s): Secondary | ICD-10-CM

## 2023-03-08 NOTE — Telephone Encounter (Signed)
No longer current dosing on this medication. Requested Prescriptions  Pending Prescriptions Disp Refills   levothyroxine (SYNTHROID) 112 MCG tablet [Pharmacy Med Name: LEVOTHYROXINE 112 MCG TABLET] 90 tablet 3    Sig: TAKE 1 TABLET BY MOUTH DAILY BEFORE BREAKFAST.     Endocrinology:  Hypothyroid Agents Passed - 03/07/2023 11:05 AM      Passed - TSH in normal range and within 360 days    TSH  Date Value Ref Range Status  11/09/2022 1.870 0.450 - 4.500 uIU/mL Final         Passed - Valid encounter within last 12 months    Recent Outpatient Visits           2 months ago Benign hypertensive renal disease   [place] [facility] [place], Megan P, DO   5 months ago Benign hypertensive renal disease   [place] [facility] [place], Megan P, DO   8 months ago Balance problem   [place] [facility] [place], Megan P, DO   11 months ago Routine general medical examination at a health care facility   [facility] [place], [place] P, DO   1 year ago Hypothyroidism, unspecified type   [place] [facility] Dorcas Carrow, DO       Future Appointments             In 3 weeks Laural Benes, Oralia Rud, DO [place] [facility]

## 2023-03-08 NOTE — Progress Notes (Signed)
  Subjective:  Patient ID: Cassie Brewer, female    DOB: 1929-03-05,  MRN: [id number]  Chief Complaint  Patient presents with   Nail Problem    PT is here for a nail trim   89 y.o. female returns for the above complaint.  Patient presents with thickened elongated mycotic toenails x10.  Patient is especially concerned for right hallux onychomycosis.  Patient not able to debride on herself.  She would like for [place] to debride down.  She does not have any secondary complaints. Objective:   There were no vitals filed for this visit. Podiatric Exam: Vascular: dorsalis pedis and posterior tibial pulses are palpable bilateral. Capillary return is immediate. Temperature gradient is WNL. Skin turgor WNL  Sensorium: Normal Semmes Weinstein monofilament test. Normal tactile sensation bilaterally.  However she experiences subjective neuropathic pain to the distal tip of the toes.  Intact sensation Nail Exam: Pt has thick disfigured discolored nails with subungual debris noted bilateral entire nail hallux through fifth toenails.  Ingrown noted to the right hallux lateral border.  Mild pain on palpation. Ulcer Exam: There is no evidence of ulcer or pre-ulcerative changes or infection. Orthopedic Exam: Muscle tone and strength are WNL. No limitations in general ROM. No crepitus or effusions noted. HAV  B/L.  Hammer toes 2-5  B/L. Skin: No Porokeratosis. No infection or ulcers.  Xerosis/psoriasis of the plantar foot with now subjective component of itching..  There is mild cracking noted.  No bleeding noted.  Assessment & Plan:  Patient was evaluated and treated and all questions answered.  Athlete's foot bilateral -Explained to patient the etiology of athlete's foot and various treatment options were discussed.  Given that there is subjective complaint of itching associated with the bottom of the foot I believe she will benefit from Lotrisone cream.  I have asked her to apply twice a day.  She states  understanding.  Neuropathic pain -doing well  Xerosis/psoriasis -I explained to the patient the etiology of dryness with cracking as well as various treatment options associated with it.  Given that she is doing good with clotrimazole cream that was given to her by dermatologist and had diagnosed her with psoriasis, I believe she can continue using the cream.  Patient agrees with this plan.  Onychomycosis with pain  -Nails palliatively debrided as below. -Educated on self-care  Procedure: Nail Debridement Rationale: pain  Type of Debridement: manual, sharp debridement. Instrumentation: Nail nipper, rotary burr. Number of Nails: 10  Procedures and Treatment: Consent by patient was obtained for treatment procedures. The patient understood the discussion of treatment and procedures well. All questions were answered thoroughly reviewed. Debridement of mycotic and hypertrophic toenails, 1 through 5 bilateral and clearing of subungual debris. No ulceration, no infection noted.  Return Visit-Office Procedure: Patient instructed to return to the office for a follow up visit 3 months for continued evaluation and treatment.  Nicholes Rough, DPM    No follow-ups on file.

## 2023-03-11 DIAGNOSIS — R58 Hemorrhage, not elsewhere classified: Secondary | ICD-10-CM | POA: Diagnosis not present

## 2023-03-11 DIAGNOSIS — R208 Other disturbances of skin sensation: Secondary | ICD-10-CM | POA: Diagnosis not present

## 2023-03-11 DIAGNOSIS — D485 Neoplasm of uncertain behavior of skin: Secondary | ICD-10-CM | POA: Diagnosis not present

## 2023-03-11 DIAGNOSIS — D0439 Carcinoma in situ of skin of other parts of face: Secondary | ICD-10-CM | POA: Diagnosis not present

## 2023-03-15 ENCOUNTER — Inpatient Hospital Stay: Payer: PPO | Attending: Oncology | Admitting: Oncology

## 2023-03-15 ENCOUNTER — Encounter: Payer: Self-pay | Admitting: Oncology

## 2023-03-15 VITALS — BP 127/72 | HR 77 | Temp 97.8°F | Resp 16 | Wt 153.0 lb

## 2023-03-15 DIAGNOSIS — D0511 Intraductal carcinoma in situ of right breast: Secondary | ICD-10-CM | POA: Insufficient documentation

## 2023-03-15 DIAGNOSIS — Z08 Encounter for follow-up examination after completed treatment for malignant neoplasm: Secondary | ICD-10-CM | POA: Diagnosis not present

## 2023-03-15 DIAGNOSIS — N1831 Chronic kidney disease, stage 3a: Secondary | ICD-10-CM | POA: Diagnosis not present

## 2023-03-15 DIAGNOSIS — Z86000 Personal history of in-situ neoplasm of breast: Secondary | ICD-10-CM

## 2023-03-15 NOTE — Progress Notes (Signed)
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  Mammogram showed possible calcifications in the right breast.  This was followed by diagnostic mammogramWhich showed 6 mm group of calcifications in the right upper breast.  This was biopsied and was consistent with DCIS high-grade with comedonecrosis and calcifications.    Patient underwent right lumpectomy on 09/01/2022.  Pathology showed 16 mm grade 3 DCIS with necrosis.  DCIS present at inferior and posterior margins.  ER low +1 to 10%.  She completed adjuvant radiation therapy.  Given her age and low ER positivity adjuvant endocrine therapy was not recommended.    Interval history-she is doing well overall for her age.  Remains independent of her ADLs and IADLs.  Occasional pain at the lumpectomy site but denies other complaints at this time  ECOG PS- 1 Pain scale- 0   Review of systems- Review of Systems  Constitutional:  Negative for chills, fever, malaise/fatigue and weight loss.  HENT:  Negative for congestion, ear discharge and nosebleeds.   Eyes:   Negative for blurred vision.  Respiratory:  Negative for cough, hemoptysis, sputum production, shortness of breath and wheezing.   Cardiovascular:  Negative for chest pain, palpitations, orthopnea and claudication.  Gastrointestinal:  Negative for abdominal pain, blood in stool, constipation, diarrhea, heartburn, melena, nausea and vomiting.  Genitourinary:  Negative for dysuria, flank pain, frequency, hematuria and urgency.  Musculoskeletal:  Negative for back pain, joint pain and myalgias.  Skin:  Negative for rash.  Neurological:  Negative for dizziness, tingling, focal weakness, seizures, weakness and headaches.  Endo/Heme/Allergies:  Does not bruise/bleed easily.  Psychiatric/Behavioral:  Negative for depression and suicidal ideas. The patient does not have insomnia.       Allergies  Allergen Reactions   Chlorhexidine Rash   Amoxicillin     Other reaction(s): Other (See Comments) "blood pressure very high - almost passed out"   Eryc [Erythromycin] Other (See Comments)   Sulfa Antibiotics Nausea And Vomiting    Nausea with bactrim in 2017       Past Medical History:  Diagnosis Date   Glaucoma    Hyperlipidemia    Hypertension    Hypothyroidism 10/02/2014   Overactive bladder    Pneumonia    PONV (postoperative nausea and vomiting)    extreme headaches also   Squamous cell skin cancer, face    Stage 3a chronic kidney disease (CKD) (HCC)    Stroke (HCC) 02/2020   speech stroke     Past Surgical History:  Procedure Laterality Date   BREAST BIOPSY Right 08/04/2022   Right Breast Stereo, Ribbon clip- path pending   BREAST BIOPSY Right 08/04/2022   MM RT BREAST BX W LOC DEV  1ST LESION IMAGE BX SPEC STEREO GUIDE 08/04/2022 [facility]   BREAST BIOPSY Right 08/18/2022   MM RT RADIO FREQUENCY TAG LOC MAMMO GUIDE 08/18/2022 [facility]   BREAST LUMPECTOMY WITH RADIOFREQUENCY TAG IDENTIFICATION Right 09/01/2022   Procedure: BREAST LUMPECTOMY WITH RADIOFREQUENCY TAG  IDENTIFICATION;  Surgeon: Campbell Lerner, MD;  Location: [facility];  Service: General;  Laterality: Right;   BREAST SURGERY Bilateral    cysts removal   CATARACT EXTRACTION W/ INTRAOCULAR LENS  IMPLANT, BILATERAL     PARTIAL HYSTERECTOMY  1982   SURGICAL EXCISION OF EXCESSIVE SKIN Bilateral    x4 1960's and 1970's   THYROIDECTOMY, PARTIAL Right 1984    Social History   Socioeconomic History   Marital status: Widowed    Spouse name: Not on file   Number of children: Not on file   Years of education: Not on file   Highest education level: Associate degree: occupational, Scientist, product/process development, or vocational program  Occupational History   Not on file  Tobacco Use   Smoking status: Never    Passive exposure: Never   Smokeless tobacco: Never  Vaping Use   Vaping status: Never Used  Substance and Sexual Activity   Alcohol use: No   Drug use: No   Sexual activity: Not Currently    Birth control/protection: Surgical  Other Topics Concern   Not on file  Social History Narrative   Lives alone   Social Drivers of Health   Financial Resource Strain: Low Risk  (06/28/2022)   Overall Financial Resource Strain (CARDIA)    Difficulty of Paying Living Expenses: Not hard at all  Food Insecurity: No Food Insecurity (08/10/2022)   Hunger Vital Sign    Worried About Running Out of Food in the Last Year: Never true    Ran Out of Food in the Last Year: Never true  Transportation Needs: No Transportation Needs (08/10/2022)   PRAPARE - Administrator, Civil Service (Medical): No    Lack of Transportation (Non-Medical): No  Physical Activity: Insufficiently Active (06/28/2022)   Exercise Vital Sign    Days of Exercise per Week: 3 days    Minutes of Exercise per Session: 30 min  Stress: No Stress Concern Present (06/28/2022)   [employer] of Occupational Health - Occupational Stress Questionnaire    Feeling of Stress : Only a little  Social Connections: Moderately Integrated (06/28/2022)    Social Connection and Isolation Panel [NHANES]    Frequency of Communication with Friends and Family: More than three times a week    Frequency of Social Gatherings with Friends and Family: Once a week    Attends Religious Services: More than 4 times per year    Active Member of [employer] or Organizations: Yes    Attends Banker Meetings: More than 4 times per year    Marital Status: Widowed  Intimate Partner Violence: Not At Risk (05/31/2022)   Humiliation, Afraid, Rape, and Kick questionnaire    Fear of Current or Ex-Partner: No    Emotionally Abused: No    Physically Abused: No    Sexually Abused: No    Family History  Problem Relation Age of Onset   Hypertension Mother    Diabetes Father    Colon cancer Father    Breast cancer Grandchild      Current Outpatient Medications:    aspirin EC 81 MG tablet, Take 1 tablet (81 mg total) by mouth daily. Swallow whole., Disp: 100 tablet, Rfl: 12   atorvastatin (LIPITOR)  20 MG tablet, Take 1 tablet (20 mg total) by mouth daily., Disp: 90 tablet, Rfl: 1   benazepril (LOTENSIN) 40 MG tablet, Take 1 tablet (40 mg total) by mouth at bedtime., Disp: 90 tablet, Rfl: 1   Calcium Carbonate-Vit D-Min (CALCIUM 600+D PLUS MINERALS) 600-400 MG-UNIT TABS, Take 1 tablet by mouth daily at 12 noon., Disp: , Rfl:    clotrimazole-betamethasone (LOTRISONE) cream, Apply 1 application topically 2 (two) times daily. (Patient taking differently: Apply 1 application  topically as needed.), Disp: 30 g, Rfl: 0   Desoximetasone (TOPICORT) 0.25 % ointment, Apply 1 Application topically as needed., Disp: , Rfl:    fluticasone (FLONASE) 50 MCG/ACT nasal spray, Place into the nose., Disp: , Rfl:    hydrochlorothiazide (HYDRODIURIL) 25 MG tablet, Take 1 tablet (25 mg total) by mouth daily., Disp: 90 tablet, Rfl: 1   hydrocortisone (ANUSOL-HC) 2.5 % rectal cream, Place 1 application  rectally 2 (two) times daily. (Patient taking differently: Place 1 application   rectally as needed.), Disp: 30 g, Rfl: 12   latanoprost (XALATAN) 0.005 % ophthalmic solution, Place 1 drop into both eyes at bedtime., Disp: , Rfl: 6   levothyroxine (SYNTHROID) 100 MCG tablet, Take 1 tablet (100 mcg total) by mouth daily before breakfast., Disp: 90 tablet, Rfl: 3   Multiple Vitamins-Minerals (CENTRUM SILVER ULTRA WOMENS PO), Take 1 tablet by mouth daily., Disp: , Rfl:    Omega-3 Fatty Acids (FISH OIL) 1000 MG CAPS, Take 1,000 mg by mouth daily., Disp: , Rfl:    polyethylene glycol powder (GLYCOLAX/MIRALAX) 17 GM/SCOOP powder, Take 17 g by mouth 2 (two) times daily as needed., Disp: 3350 g, Rfl: 1   timolol (TIMOPTIC) 0.5 % ophthalmic solution, Place 1 drop into the left eye daily., Disp: , Rfl:    vitamin C (ASCORBIC ACID) 500 MG tablet, Take 500 mg by mouth daily., Disp: , Rfl:   Physical exam:  Vitals:   03/15/23 0950  BP: 127/72  Pulse: 77  Resp: 16  Temp: 97.8 F (36.6 C)  TempSrc: Tympanic  SpO2: 98%  Weight: 153 lb (69.4 kg)   Physical Exam Cardiovascular:     Rate and Rhythm: Normal rate and regular rhythm.     Heart sounds: Normal heart sounds.  Pulmonary:     Effort: Pulmonary effort is normal.     Breath sounds: Normal breath sounds.  Skin:    General: Skin is warm and dry.  Neurological:     Mental Status: She is alert and oriented to person, place, and time.    Breast exam was performed in seated and lying down position. Patient is status post right lumpectomy with a well-healed surgical scar. No evidence of any palpable masses. No evidence of axillary adenopathy. No evidence of any palpable masses or lumps in the left breast. No evidence of leftt axillary adenopathy      Latest Ref Rng & Units 09/28/2022    9:08 AM  CMP  Glucose 70 - 99 mg/dL 161   BUN 10 - 36 mg/dL 17   Creatinine 0.96 - 1.00 mg/dL 0.45   Sodium 409 - 811 mmol/L 139   Potassium 3.5 - 5.2 mmol/L 4.3   Chloride 96 - 106 mmol/L 101   CO2 20 - 29 mmol/L 23   Calcium 8.7 -  10.3 mg/dL 9.7   Total Protein 6.0 - 8.5 g/dL 6.8   Total Bilirubin 0.0 - 1.2 mg/dL 0.4   Alkaline Phos 44 - 121 IU/L 64  AST 0 - 40 IU/L 13   ALT 0 - 32 IU/L 12       Latest Ref Rng & Units 10/20/2022    9:15 AM  CBC  WBC 4.0 - 10.5 K/uL 4.6   Hemoglobin 12.0 - 15.0 g/dL 16.1   Hematocrit 09.6 - 46.0 % 34.8   Platelets 150 - 400 K/uL 259      Assessment and plan- Patient is a 89 y.o. female with history of right breast DCIS ER low 1 to 10% positive s/p lumpectomy and adjuvant radiation therapy here for routine follow-up  Clinically patient is doing well with no concerning signs and symptoms of recurrence based on today's exam.  Given the low ER expression she was not started on endocrine therapy.  She would like to continue getting surveillanceNeeds mammograms.  We will schedule for bilateral mammogram in April 2025.  I will see her back in 6 months no labs   Visit Diagnosis 1. Encounter for follow-up surveillance of ductal carcinoma in situ (DCIS) of breast      Dr. Owens Shark, MD, MPH [facility] at [facility] [phone] 03/15/2023 4:37 PM

## 2023-03-15 NOTE — Progress Notes (Signed)
Survivorship Care Plan visit completed.  Treatment summary reviewed and given to patient.  ASCO answers booklet reviewed and given to patient.  CARE program and Cancer Transitions discussed with patient along with other resources cancer center offers to patients and caregivers.  Patient verbalized understanding.    

## 2023-03-16 ENCOUNTER — Encounter: Payer: Self-pay | Admitting: *Deleted

## 2023-04-04 ENCOUNTER — Encounter: Payer: Self-pay | Admitting: Family Medicine

## 2023-04-04 ENCOUNTER — Telehealth: Payer: Self-pay

## 2023-04-04 ENCOUNTER — Ambulatory Visit (INDEPENDENT_AMBULATORY_CARE_PROVIDER_SITE_OTHER): Payer: PPO | Admitting: Family Medicine

## 2023-04-04 VITALS — BP 102/58 | HR 65 | Wt 151.2 lb

## 2023-04-04 DIAGNOSIS — D692 Other nonthrombocytopenic purpura: Secondary | ICD-10-CM

## 2023-04-04 DIAGNOSIS — Z0000 Encounter for general adult medical examination without abnormal findings: Secondary | ICD-10-CM | POA: Diagnosis not present

## 2023-04-04 DIAGNOSIS — E785 Hyperlipidemia, unspecified: Secondary | ICD-10-CM | POA: Diagnosis not present

## 2023-04-04 DIAGNOSIS — D0511 Intraductal carcinoma in situ of right breast: Secondary | ICD-10-CM | POA: Diagnosis not present

## 2023-04-04 DIAGNOSIS — I129 Hypertensive chronic kidney disease with stage 1 through stage 4 chronic kidney disease, or unspecified chronic kidney disease: Secondary | ICD-10-CM

## 2023-04-04 DIAGNOSIS — E039 Hypothyroidism, unspecified: Secondary | ICD-10-CM | POA: Diagnosis not present

## 2023-04-04 DIAGNOSIS — N183 Chronic kidney disease, stage 3 unspecified: Secondary | ICD-10-CM

## 2023-04-04 LAB — MICROALBUMIN, URINE WAIVED
Creatinine, Urine Waived: 100 mg/dL (ref 10–300)
Microalb, Ur Waived: 30 mg/L — ABNORMAL HIGH (ref 0–19)
Microalb/Creat Ratio: <30 mg/g (ref <30)

## 2023-04-04 MED ORDER — HYDROCORTISONE (PERIANAL) 2.5 % EX CREA: 1.0000 | TOPICAL_CREAM | CUTANEOUS | 6 refills | Status: AC | PRN

## 2023-04-04 MED ORDER — BENAZEPRIL HCL 40 MG PO TABS: 40.0000 mg | ORAL_TABLET | Freq: Every day | ORAL | 1 refills | Status: DC

## 2023-04-04 MED ORDER — ATORVASTATIN CALCIUM 20 MG PO TABS: 20.0000 mg | ORAL_TABLET | Freq: Every day | ORAL | 1 refills | Status: DC

## 2023-04-04 MED ORDER — HYDROCHLOROTHIAZIDE 25 MG PO TABS: 25.0000 mg | ORAL_TABLET | Freq: Every day | ORAL | 1 refills | Status: DC

## 2023-04-04 NOTE — Assessment & Plan Note (Signed)
 Rechecking labs today. Await results. Treat as needed.

## 2023-04-04 NOTE — Assessment & Plan Note (Signed)
 Reassured patient. Continue to monitor.

## 2023-04-04 NOTE — Assessment & Plan Note (Signed)
 Continue to follow with oncology. Call with any concerns.

## 2023-04-04 NOTE — Telephone Encounter (Signed)
 Requested paperwork has been completed and placed in provider's folder for signature.

## 2023-04-04 NOTE — Assessment & Plan Note (Signed)
 Under good control on current regimen. Continue current regimen. Continue to monitor. Call with any concerns. Refills given. Labs drawn today.

## 2023-04-04 NOTE — Progress Notes (Signed)
 BP (!) 102/58   Pulse 65   Wt 151 lb 3.2 oz (68.6 kg)   LMP 05/13/1980   SpO2 96%   BMI 26.36 kg/m    Subjective:    Patient ID: Cassie Brewer, female    DOB: 24-Sep-1928, 89 y.o.   MRN: [id number]  HPI: Cassie Brewer is a 89 y.o. female presenting on 04/04/2023 for comprehensive medical examination. Current medical complaints include:  HYPOTHYROIDISM Thyroid  control status:controlled Satisfied with current treatment? no Medication side effects: no Medication compliance: excellent compliance Etiology of hypothyroidism:  Recent dose adjustment:no Fatigue: no Cold intolerance: no Heat intolerance: no Weight gain: no Weight loss: no Constipation: no Diarrhea/loose stools: no Palpitations: no Lower extremity edema: no Anxiety/depressed mood: no  HYPERTENSION / HYPERLIPIDEMIA Satisfied with current treatment? yes Duration of hypertension: chronic BP monitoring frequency: not checking BP medication side effects: no Past BP meds: hydrochlorothiazide , benazepril  Duration of hyperlipidemia: chronic Cholesterol medication side effects: no Cholesterol supplements: fish oil Past cholesterol medications: atorvastatin  Medication compliance: excellent compliance Aspirin : yes Recent stressors: no Recurrent headaches: no Visual changes: no Palpitations: no Dyspnea: no Chest pain: no Lower extremity edema: no Dizzy/lightheaded: no  Menopausal Symptoms: no  Depression Screen done today and results listed below:     04/04/2023   10:02 AM 12/29/2022   11:51 AM 09/28/2022    9:03 AM 08/10/2022    3:11 PM 06/29/2022    9:46 AM  Depression screen PHQ 2/9  Decreased Interest 0 1 0 0 0  Down, Depressed, Hopeless 0 0 0 0 0  PHQ - 2 Score 0 1 0 0 0  Altered sleeping 0 0 0  0  Tired, decreased energy 0 1 1  1   Change in appetite 0 0 0  0  Feeling bad or failure about yourself  0 0 0  0  Trouble concentrating 0 0 0  0  Moving slowly or fidgety/restless 1 1 1  1   Suicidal thoughts  0 0 0  0  PHQ-9 Score 1 3 2  2   Difficult doing work/chores Not difficult at all Not difficult at all   Not difficult at all    Past Medical History:  Past Medical History:  Diagnosis Date   Glaucoma    History of stroke 05/23/2020   Hyperlipidemia    Hypertension    Hypothyroidism 10/02/2014   Overactive bladder    Pneumonia    PONV (postoperative nausea and vomiting)    extreme headaches also   Squamous cell skin cancer, face    Stage 3a chronic kidney disease (CKD) (HCC)    Stroke (HCC) 02/2020   speech stroke   TIA (transient ischemic attack) 03/10/2020    Surgical History:  Past Surgical History:  Procedure Laterality Date   BREAST BIOPSY Right 08/04/2022   Right Breast Stereo, Ribbon clip- path pending   BREAST BIOPSY Right 08/04/2022   MM RT BREAST BX W LOC DEV 1ST LESION IMAGE BX SPEC STEREO GUIDE 08/04/2022 [facility]   BREAST BIOPSY Right 08/18/2022   MM RT RADIO FREQUENCY TAG LOC MAMMO GUIDE 08/18/2022 [facility]   BREAST LUMPECTOMY WITH RADIOFREQUENCY TAG IDENTIFICATION Right 09/01/2022   Procedure: BREAST LUMPECTOMY WITH RADIOFREQUENCY TAG IDENTIFICATION;  Surgeon: Lane Shope, MD;  Location: [facility];  Service: General;  Laterality: Right;   BREAST SURGERY Bilateral    cysts removal   CATARACT EXTRACTION W/ INTRAOCULAR LENS  IMPLANT, BILATERAL     PARTIAL HYSTERECTOMY  1982   SURGICAL EXCISION OF EXCESSIVE SKIN  Bilateral    x4 1960's and 1970's   THYROIDECTOMY, PARTIAL Right 1984    Medications:  Current Outpatient Medications on File Prior to Visit  Medication Sig   aspirin  EC 81 MG tablet Take 1 tablet (81 mg total) by mouth daily. Swallow whole.   Calcium  Carbonate-Vit D-Min (CALCIUM  600+D PLUS MINERALS) 600-400 MG-UNIT TABS Take 1 tablet by mouth daily at 12 noon.   clotrimazole -betamethasone  (LOTRISONE ) cream Apply 1 application topically 2 (two) times daily. (Patient taking differently: Apply 1 application  topically as needed.)    Desoximetasone (TOPICORT) 0.25 % ointment Apply 1 Application topically as needed.   fluticasone  (FLONASE ) 50 MCG/ACT nasal spray Place into the nose.   latanoprost  (XALATAN ) 0.005 % ophthalmic solution Place 1 drop into both eyes at bedtime.   levothyroxine  (SYNTHROID ) 100 MCG tablet Take 1 tablet (100 mcg total) by mouth daily before breakfast.   Multiple Vitamins-Minerals (CENTRUM SILVER  ULTRA WOMENS PO) Take 1 tablet by mouth daily.   Omega-3 Fatty Acids (FISH OIL) 1000 MG CAPS Take 1,000 mg by mouth daily.   polyethylene glycol powder (GLYCOLAX /MIRALAX ) 17 GM/SCOOP powder Take 17 g by mouth 2 (two) times daily as needed.   timolol  (TIMOPTIC ) 0.5 % ophthalmic solution Place 1 drop into the left eye daily.   vitamin C (ASCORBIC ACID) 500 MG tablet Take 500 mg by mouth daily.   No current facility-administered medications on file prior to visit.    Allergies:  Allergies  Allergen Reactions   Chlorhexidine  Rash   Amoxicillin     Other reaction(s): Other (See Comments) blood pressure very high - almost passed out   Eryc [Erythromycin] Other (See Comments)   Sulfa  Antibiotics Nausea And Vomiting    Nausea with bactrim  in 2017      Social History:  Social History   Socioeconomic History   Marital status: Widowed    Spouse name: Not on file   Number of children: Not on file   Years of education: Not on file   Highest education level: Associate degree: occupational, scientist, product/process development, or vocational program  Occupational History   Not on file  Tobacco Use   Smoking status: Never    Passive exposure: Never   Smokeless tobacco: Never  Vaping Use   Vaping status: Never Used  Substance and Sexual Activity   Alcohol use: No   Drug use: No   Sexual activity: Not Currently    Birth control/protection: Surgical  Other Topics Concern   Not on file  Social History Narrative   Lives alone   Social Drivers of Health   Financial Resource Strain: Low Risk  (04/03/2023)   Overall  Financial Resource Strain (CARDIA)    Difficulty of Paying Living Expenses: Not hard at all  Food Insecurity: No Food Insecurity (04/03/2023)   Hunger Vital Sign    Worried About Running Out of Food in the Last Year: Never true    Ran Out of Food in the Last Year: Never true  Transportation Needs: No Transportation Needs (04/03/2023)   PRAPARE - Administrator, Civil Service (Medical): No    Lack of Transportation (Non-Medical): No  Physical Activity: Unknown (04/03/2023)   Exercise Vital Sign    Days of Exercise per Week: 4 days    Minutes of Exercise per Session: Patient declined  Stress: No Stress Concern Present (04/03/2023)   [employer] of Occupational Health - Occupational Stress Questionnaire    Feeling of Stress : Not at all  Social Connections: Moderately  Integrated (04/03/2023)   Social Connection and Isolation Panel [NHANES]    Frequency of Communication with Friends and Family: More than three times a week    Frequency of Social Gatherings with Friends and Family: Three times a week    Attends Religious Services: More than 4 times per year    Active Member of Clubs or Organizations: No    Attends Banker Meetings: More than 4 times per year    Marital Status: Widowed  Intimate Partner Violence: Not At Risk (05/31/2022)   Humiliation, Afraid, Rape, and Kick questionnaire    Fear of Current or Ex-Partner: No    Emotionally Abused: No    Physically Abused: No    Sexually Abused: No   Social History   Tobacco Use  Smoking Status Never   Passive exposure: Never  Smokeless Tobacco Never   Social History   Substance and Sexual Activity  Alcohol Use No    Family History:  Family History  Problem Relation Age of Onset   Hypertension Mother    Diabetes Father    Colon cancer Father    Breast cancer Grandchild     Past medical history, surgical history, medications, allergies, family history and social history reviewed with patient today and  changes made to appropriate areas of the chart.   Review of Systems  Constitutional: Negative.   HENT: Negative.    Eyes: Negative.   Respiratory: Negative.    Cardiovascular:  Positive for leg swelling. Negative for chest pain, palpitations, orthopnea, claudication and PND.  Gastrointestinal:  Positive for constipation. Negative for abdominal pain, blood in stool, diarrhea, heartburn, melena, nausea and vomiting.  Genitourinary: Negative.   Musculoskeletal: Negative.   Skin:  Positive for rash. Negative for itching.  Neurological: Negative.   Endo/Heme/Allergies:  Positive for polydipsia. Negative for environmental allergies. Bruises/bleeds easily.  Psychiatric/Behavioral: Negative.     All other ROS negative except what is listed above and in the HPI.      Objective:    BP (!) 102/58   Pulse 65   Wt 151 lb 3.2 oz (68.6 kg)   LMP 05/13/1980   SpO2 96%   BMI 26.36 kg/m   Wt Readings from Last 3 Encounters:  04/04/23 151 lb 3.2 oz (68.6 kg)  03/15/23 153 lb (69.4 kg)  03/08/23 150 lb (68 kg)    Physical Exam Vitals and nursing note reviewed.  Constitutional:      General: She is not in acute distress.    Appearance: Normal appearance. She is not ill-appearing, toxic-appearing or diaphoretic.  HENT:     Head: Normocephalic and atraumatic.     Right Ear: Tympanic membrane, ear canal and external ear normal. There is no impacted cerumen.     Left Ear: Tympanic membrane, ear canal and external ear normal. There is no impacted cerumen.     Nose: Nose normal. No congestion or rhinorrhea.     Mouth/Throat:     Mouth: Mucous membranes are moist.     Pharynx: Oropharynx is clear. No oropharyngeal exudate or posterior oropharyngeal erythema.  Eyes:     General: No scleral icterus.       Right eye: No discharge.        Left eye: No discharge.     Extraocular Movements: Extraocular movements intact.     Conjunctiva/sclera: Conjunctivae normal.     Pupils: Pupils are equal,  round, and reactive to light.  Neck:     Vascular: No carotid bruit.  Cardiovascular:     Rate and Rhythm: Normal rate and regular rhythm.     Pulses: Normal pulses.     Heart sounds: No murmur heard.    No friction rub. No gallop.  Pulmonary:     Effort: Pulmonary effort is normal. No respiratory distress.     Breath sounds: Normal breath sounds. No stridor. No wheezing, rhonchi or rales.  Chest:     Chest wall: No tenderness.  Abdominal:     General: Abdomen is flat. Bowel sounds are normal. There is no distension.     Palpations: Abdomen is soft. There is no mass.     Tenderness: There is no abdominal tenderness. There is no right CVA tenderness, left CVA tenderness, guarding or rebound.     Hernia: No hernia is present.  Genitourinary:    Comments: Breast and pelvic exams deferred with shared decision making Musculoskeletal:        General: No swelling, tenderness, deformity or signs of injury.     Cervical back: Normal range of motion and neck supple. No rigidity. No muscular tenderness.     Right lower leg: No edema.     Left lower leg: No edema.  Lymphadenopathy:     Cervical: No cervical adenopathy.  Skin:    General: Skin is warm and dry.     Capillary Refill: Capillary refill takes less than 2 seconds.     Coloration: Skin is not jaundiced or pale.     Findings: No bruising, erythema, lesion or rash.  Neurological:     General: No focal deficit present.     Mental Status: She is alert and oriented to person, place, and time. Mental status is at baseline.     Cranial Nerves: No cranial nerve deficit.     Sensory: No sensory deficit.     Motor: No weakness.     Coordination: Coordination normal.     Gait: Gait normal.     Deep Tendon Reflexes: Reflexes normal.  Psychiatric:        Mood and Affect: Mood normal.        Behavior: Behavior normal.        Thought Content: Thought content normal.        Judgment: Judgment normal.     Results for orders placed or  performed in visit on 11/09/22  TSH   Collection Time: 11/09/22  9:16 AM  Result Value Ref Range   TSH 1.870 0.450 - 4.500 uIU/mL      Assessment & Plan:   Problem List Items Addressed This Visit       Cardiovascular and Mediastinum   Senile purpura (HCC)   Reassured patient. Continue to monitor.       Relevant Medications   atorvastatin  (LIPITOR) 20 MG tablet   benazepril  (LOTENSIN ) 40 MG tablet   hydrochlorothiazide  (HYDRODIURIL ) 25 MG tablet     Endocrine   Hypothyroidism   Rechecking labs today. Await results. Treat as needed.       Relevant Orders   TSH     Genitourinary   Benign hypertensive renal disease   Under good control on current regimen. Continue current regimen. Continue to monitor. Call with any concerns. Refills given. Labs drawn today.        Relevant Medications   benazepril  (LOTENSIN ) 40 MG tablet   hydrochlorothiazide  (HYDRODIURIL ) 25 MG tablet   Other Relevant Orders   Microalbumin, Urine Waived   CBC with Differential/Platelet   CKD (chronic kidney disease), stage  III (HCC)   Rechecking labs today. Await results. Treat as needed.       Relevant Orders   Comprehensive metabolic panel     Other   Hyperlipidemia   Under good control on current regimen. Continue current regimen. Continue to monitor. Call with any concerns. Refills given. Labs drawn today.       Relevant Medications   atorvastatin  (LIPITOR) 20 MG tablet   benazepril  (LOTENSIN ) 40 MG tablet   hydrochlorothiazide  (HYDRODIURIL ) 25 MG tablet   Other Relevant Orders   Comprehensive metabolic panel   Lipid Panel w/o Chol/HDL Ratio   Ductal carcinoma in situ (DCIS) of right breast   Continue to follow with oncology. Call with any concerns.       Other Visit Diagnoses       Routine general medical examination at a health care facility    -  Primary   Vaccines up to date. Screening labs checked today. Mammo and DEXA up to date. Continue diet and exercise. Call with any  concerns.        Follow up plan: Return in about 3 months (around 07/03/2023).   LABORATORY TESTING:  - Pap smear: not applicable  IMMUNIZATIONS:   - Tdap: Tetanus vaccination status reviewed: last tetanus booster within 10 years. - Influenza: Up to date - Pneumovax: Up to date - Prevnar: Up to date - COVID: Refused - HPV: Not applicable - Shingrix vaccine: Refused  SCREENING: -Mammogram: Up to date  - Colonoscopy: Not applicable  - Bone Density: Up to date   PATIENT COUNSELING:   Advised to take 1 mg of folate supplement per day if capable of pregnancy.   Sexuality: Discussed sexually transmitted diseases, partner selection, use of condoms, avoidance of unintended pregnancy  and contraceptive alternatives.   Advised to avoid cigarette smoking.  I discussed with the patient that most people either abstain from alcohol or drink within safe limits (<=14/week and <=4 drinks/occasion for males, <=7/weeks and <= 3 drinks/occasion for females) and that the risk for alcohol disorders and other health effects rises proportionally with the number of drinks per week and how often a drinker exceeds daily limits.  Discussed cessation/primary prevention of drug use and availability of treatment for abuse.   Diet: Encouraged to adjust caloric intake to maintain  or achieve ideal body weight, to reduce intake of dietary saturated fat and total fat, to limit sodium intake by avoiding high sodium foods and not adding table salt, and to maintain adequate dietary potassium and calcium  preferably from fresh fruits, vegetables, and low-fat dairy products.    stressed the importance of regular exercise  Injury prevention: Discussed safety belts, safety helmets, smoke detector, smoking near bedding or upholstery.   Dental health: Discussed importance of regular tooth brushing, flossing, and dental visits.    NEXT PREVENTATIVE PHYSICAL DUE IN 1 YEAR. Return in about 3 months (around  07/03/2023).

## 2023-04-04 NOTE — Telephone Encounter (Signed)
-----   Message from Olevia Perches sent at 04/04/2023 10:19 AM EST ----- Can we get a handicap placquard please?

## 2023-04-05 LAB — CBC WITH DIFFERENTIAL/PLATELET
Basophils Absolute: 0.1 10*3/uL (ref 0.0–0.2)
Basos: 1 %
EOS (ABSOLUTE): 0.1 10*3/uL (ref 0.0–0.4)
Eos: 2 %
Hematocrit: 33.4 % — ABNORMAL LOW (ref 34.0–46.6)
Hemoglobin: 11.3 g/dL (ref 11.1–15.9)
Immature Grans (Abs): 0 10*3/uL (ref 0.0–0.1)
Immature Granulocytes: 0 %
Lymphocytes Absolute: 0.9 10*3/uL (ref 0.7–3.1)
Lymphs: 19 %
MCH: 30.6 pg (ref 26.6–33.0)
MCHC: 33.8 g/dL (ref 31.5–35.7)
MCV: 91 fL (ref 79–97)
Monocytes Absolute: 0.5 10*3/uL (ref 0.1–0.9)
Monocytes: 10 %
Neutrophils Absolute: 3.1 10*3/uL (ref 1.4–7.0)
Neutrophils: 68 %
Platelets: 280 10*3/uL (ref 150–450)
RBC: 3.69 x10E6/uL — ABNORMAL LOW (ref 3.77–5.28)
RDW: 12.4 % (ref 11.7–15.4)
WBC: 4.6 10*3/uL (ref 3.4–10.8)

## 2023-04-05 LAB — LIPID PANEL W/O CHOL/HDL RATIO
Cholesterol, Total: 172 mg/dL (ref 100–199)
HDL: 75 mg/dL (ref >39)
LDL Chol Calc (NIH): 86 mg/dL (ref 0–99)
Triglycerides: 55 mg/dL (ref 0–149)
VLDL Cholesterol Cal: 11 mg/dL (ref 5–40)

## 2023-04-05 LAB — COMPREHENSIVE METABOLIC PANEL
ALT: 14 [IU]/L (ref 0–32)
AST: 15 [IU]/L (ref 0–40)
Albumin: 4 g/dL (ref 3.6–4.6)
Alkaline Phosphatase: 68 [IU]/L (ref 44–121)
BUN/Creatinine Ratio: 23 (ref 12–28)
BUN: 22 mg/dL (ref 10–36)
Bilirubin Total: 0.5 mg/dL (ref 0.0–1.2)
CO2: 25 mmol/L (ref 20–29)
Calcium: 9.3 mg/dL (ref 8.7–10.3)
Chloride: 102 mmol/L (ref 96–106)
Creatinine, Ser: 0.96 mg/dL (ref 0.57–1.00)
Globulin, Total: 2.6 g/dL (ref 1.5–4.5)
Glucose: 106 mg/dL — ABNORMAL HIGH (ref 70–99)
Potassium: 4.1 mmol/L (ref 3.5–5.2)
Sodium: 142 mmol/L (ref 134–144)
Total Protein: 6.6 g/dL (ref 6.0–8.5)
eGFR: 56 mL/min/{1.73_m2} — ABNORMAL LOW (ref >59)

## 2023-04-05 LAB — TSH: TSH: 5.81 u[IU]/mL — ABNORMAL HIGH (ref 0.450–4.500)

## 2023-04-06 ENCOUNTER — Encounter: Payer: Self-pay | Admitting: Family Medicine

## 2023-04-06 ENCOUNTER — Other Ambulatory Visit: Payer: Self-pay | Admitting: Family Medicine

## 2023-04-06 DIAGNOSIS — E039 Hypothyroidism, unspecified: Secondary | ICD-10-CM

## 2023-04-06 MED ORDER — LEVOTHYROXINE SODIUM 112 MCG PO TABS: 112.0000 ug | ORAL_TABLET | Freq: Every day | ORAL | 1 refills | Status: DC

## 2023-04-06 NOTE — Progress Notes (Signed)
 Pt stated that she seen her message on MyChart so she did not need printed copy mailed.  Scheduled her on 05/19/2023 @ 9:00 am.

## 2023-04-07 ENCOUNTER — Other Ambulatory Visit: Payer: Self-pay | Admitting: Oncology

## 2023-04-07 DIAGNOSIS — Z08 Encounter for follow-up examination after completed treatment for malignant neoplasm: Secondary | ICD-10-CM

## 2023-04-29 ENCOUNTER — Other Ambulatory Visit: Payer: Self-pay | Admitting: Family Medicine

## 2023-04-29 DIAGNOSIS — E039 Hypothyroidism, unspecified: Secondary | ICD-10-CM

## 2023-04-29 NOTE — Telephone Encounter (Signed)
Requested medications are due for refill today.  no  Requested medications are on the active medications list.  yes  Last refill. 04/06/2023 #30 1 rf  Future visit scheduled.   yes  Notes to clinic.  Abnormal labs.    Requested Prescriptions  Pending Prescriptions Disp Refills   levothyroxine (SYNTHROID) 112 MCG tablet [Pharmacy Med Name: LEVOTHYROXINE 112 MCG TABLET] 90 tablet 1    Sig: TAKE 1 TABLET BY MOUTH DAILY BEFORE BREAKFAST.     Endocrinology:  Hypothyroid Agents Failed - 04/29/2023  4:34 PM      Failed - TSH in normal range and within 360 days    TSH  Date Value Ref Range Status  04/04/2023 5.810 (H) 0.450 - 4.500 uIU/mL Final         Passed - Valid encounter within last 12 months    Recent Outpatient Visits           3 weeks ago Routine general medical examination at a health care facility   [facility] [place] P, DO   4 months ago Benign hypertensive renal disease   [facility] [place], Megan P, DO   7 months ago Benign hypertensive renal disease   [facility] [place], Megan P, DO   10 months ago Balance problem   [place] [facility] [place], Megan P, DO   1 year ago Routine general medical examination at a health care facility   [facility] Dorcas Carrow, DO       Future Appointments             In 2 months Dorcas Carrow, DO  [facility]

## 2023-05-12 ENCOUNTER — Other Ambulatory Visit: Payer: Self-pay | Admitting: Family Medicine

## 2023-05-12 DIAGNOSIS — E039 Hypothyroidism, unspecified: Secondary | ICD-10-CM

## 2023-05-13 NOTE — Telephone Encounter (Signed)
Rx 04/06/23 #30 1RF- too soon Requested Prescriptions  Pending Prescriptions Disp Refills   levothyroxine (SYNTHROID) 112 MCG tablet [Pharmacy Med Name: LEVOTHYROXINE 112 MCG TABLET] 30 tablet 1    Sig: TAKE 1 TABLET BY MOUTH DAILY BEFORE BREAKFAST.     Endocrinology:  Hypothyroid Agents Failed - 05/13/2023 11:40 AM      Failed - TSH in normal range and within 360 days    TSH  Date Value Ref Range Status  04/04/2023 5.810 (H) 0.450 - 4.500 uIU/mL Final         Passed - Valid encounter within last 12 months    Recent Outpatient Visits           1 month ago Routine general medical examination at a health care facility   [facility] [place] P, DO   4 months ago Benign hypertensive renal disease   [facility] [place], Megan P, DO   7 months ago Benign hypertensive renal disease   [place] [place], Megan P, DO   10 months ago Balance problem   [facility] [place], Megan P, DO   1 year ago Routine general medical examination at a health care facility   [facility] Dorcas Carrow, DO       Future Appointments             In 1 month La[place] Benes, Oralia Rud, DO [facility]

## 2023-05-19 ENCOUNTER — Other Ambulatory Visit: Payer: Self-pay

## 2023-05-20 ENCOUNTER — Other Ambulatory Visit: Payer: PPO

## 2023-05-20 DIAGNOSIS — E039 Hypothyroidism, unspecified: Secondary | ICD-10-CM | POA: Diagnosis not present

## 2023-05-21 LAB — TSH: TSH: 2.4 u[IU]/mL (ref 0.450–4.500)

## 2023-05-22 ENCOUNTER — Encounter: Payer: Self-pay | Admitting: Family Medicine

## 2023-05-22 ENCOUNTER — Other Ambulatory Visit: Payer: Self-pay | Admitting: Family Medicine

## 2023-05-22 DIAGNOSIS — E039 Hypothyroidism, unspecified: Secondary | ICD-10-CM

## 2023-05-22 MED ORDER — LEVOTHYROXINE SODIUM 112 MCG PO TABS: 112.0000 ug | ORAL_TABLET | Freq: Every day | ORAL | 3 refills | Status: AC

## 2023-05-23 NOTE — Progress Notes (Signed)
Results printed and mailed.   

## 2023-05-26 NOTE — Progress Notes (Signed)
 Letter printed and mailed to patient.

## 2023-06-07 ENCOUNTER — Ambulatory Visit: Payer: PPO | Admitting: Podiatry

## 2023-06-07 DIAGNOSIS — M79674 Pain in right toe(s): Secondary | ICD-10-CM

## 2023-06-07 DIAGNOSIS — M79675 Pain in left toe(s): Secondary | ICD-10-CM

## 2023-06-07 DIAGNOSIS — B351 Tinea unguium: Secondary | ICD-10-CM

## 2023-06-07 NOTE — Progress Notes (Signed)
  Subjective:  Patient ID: Cassie Brewer, female    DOB: [birth year],  MRN: [id number]  Chief Complaint  Patient presents with   Nail Problem    Nail trim   89 y.o. female returns for the above complaint.  Patient presents with thickened elongated mycotic toenails x10.  Patient is especially concerned for right hallux onychomycosis.  Patient not able to debride on herself.  She would like for [place] to debride down.  She does not have any secondary complaints. Objective:   There were no vitals filed for this visit. Podiatric Exam: Vascular: dorsalis pedis and posterior tibial pulses are palpable bilateral. Capillary return is immediate. Temperature gradient is WNL. Skin turgor WNL  Sensorium: Normal Semmes Weinstein monofilament test. Normal tactile sensation bilaterally.  However she experiences subjective neuropathic pain to the distal tip of the toes.  Intact sensation Nail Exam: Pt has thick disfigured discolored nails with subungual debris noted bilateral entire nail hallux through fifth toenails.  Ingrown noted to the right hallux lateral border.  Mild pain on palpation. Ulcer Exam: There is no evidence of ulcer or pre-ulcerative changes or infection. Orthopedic Exam: Muscle tone and strength are WNL. No limitations in general ROM. No crepitus or effusions noted. HAV  B/L.  Hammer toes 2-5  B/L. Skin: No Porokeratosis. No infection or ulcers.  Xerosis/psoriasis of the plantar foot with now subjective component of itching..  There is mild cracking noted.  No bleeding noted.  Assessment & Plan:  Patient was evaluated and treated and all questions answered.  Athlete's foot bilateral -Explained to patient the etiology of athlete's foot and various treatment options were discussed.  Given that there is subjective complaint of itching associated with the bottom of the foot I believe she will benefit from Lotrisone cream.  I have asked her to apply twice a day.  She states  understanding.  Neuropathic pain -doing well  Xerosis/psoriasis -I explained to the patient the etiology of dryness with cracking as well as various treatment options associated with it.  Given that she is doing good with clotrimazole cream that was given to her by dermatologist and had diagnosed her with psoriasis, I believe she can continue using the cream.  Patient agrees with this plan.  Onychomycosis with pain  -Nails palliatively debrided as below. -Educated on self-care  Procedure: Nail Debridement Rationale: pain  Type of Debridement: manual, sharp debridement. Instrumentation: Nail nipper, rotary burr. Number of Nails: 10  Procedures and Treatment: Consent by patient was obtained for treatment procedures. The patient understood the discussion of treatment and procedures well. All questions were answered thoroughly reviewed. Debridement of mycotic and hypertrophic toenails, 1 through 5 bilateral and clearing of subungual debris. No ulceration, no infection noted.  Return Visit-Office Procedure: Patient instructed to return to the office for a follow up visit 3 months for continued evaluation and treatment.  Nicholes Rough, DPM    No follow-ups on file.

## 2023-06-09 ENCOUNTER — Ambulatory Visit (INDEPENDENT_AMBULATORY_CARE_PROVIDER_SITE_OTHER): Payer: PPO | Admitting: Emergency Medicine

## 2023-06-09 VITALS — Ht 63.5 in | Wt 148.0 lb

## 2023-06-09 DIAGNOSIS — Z0000 Encounter for general adult medical examination without abnormal findings: Secondary | ICD-10-CM | POA: Diagnosis not present

## 2023-06-09 NOTE — Progress Notes (Signed)
 Subjective:   Cassie Brewer is a 89 y.o. who presents for a Medicare Wellness preventive visit.  Visit Complete: Virtual I connected with  [name] on 06/09/23 by a audio enabled telemedicine application and verified that I am speaking with the correct person using two identifiers.  Patient Location: Home  Provider Location: Home Office  I discussed the limitations of evaluation and management by telemedicine. The patient expressed understanding and agreed to proceed.  Vital Signs: Because this visit was a virtual/telehealth visit, some criteria may be missing or patient reported. Any vitals not documented were not able to be obtained and vitals that have been documented are patient reported.  VideoDeclined- This patient declined Librarian, academic. Therefore the visit was completed with audio only.  Persons Participating in Visit: Patient.  AWV Questionnaire: Yes: Patient Medicare AWV questionnaire was completed by the patient on 06/06/23; I have confirmed that all information answered by patient is correct and no changes since this date.  Cardiac Risk Factors include: advanced age (>59men, >41 women);dyslipidemia;hypertension     Objective:    Today's Vitals   06/09/23 1557  Weight: 148 lb (67.1 kg)  Height: 5' 3.5" (1.613 m)   Body mass index is 25.81 kg/m.     06/09/2023    4:11 PM 03/15/2023    9:48 AM 12/09/2022   10:44 AM 09/14/2022    9:47 AM 09/13/2022   10:13 AM 09/01/2022    7:55 AM 08/27/2022    1:15 PM  Advanced Directives  Does Patient Have a Medical Advance Directive? Yes Yes No Yes Yes Yes Yes  Type of Advance Directive Living will Healthcare Power of [place];Living will  Healthcare Power of [place];Living will Healthcare Power of [place];Living will Healthcare Power of [place];Living will Healthcare Power of [place];Living will  Does patient want to make changes to medical advance directive? No - Patient declined   No -  Patient declined  No - Patient declined   Copy of Healthcare Power of Attorney in Chart?  No - copy requested  No - copy requested  No - copy requested No - copy requested  Would patient like information on creating a medical advance directive?    No - Patient declined       Current Medications (verified) Outpatient Encounter Medications as of 06/09/2023  Medication Sig   aspirin EC 81 MG tablet Take 1 tablet (81 mg total) by mouth daily. Swallow whole.   atorvastatin (LIPITOR) 20 MG tablet Take 1 tablet (20 mg total) by mouth daily.   benazepril (LOTENSIN) 40 MG tablet Take 1 tablet (40 mg total) by mouth at bedtime.   Calcium Carbonate-Vit D-Min (CALCIUM 600+D PLUS MINERALS) 600-400 MG-UNIT TABS Take 1 tablet by mouth daily at 12 noon.   clotrimazole-betamethasone (LOTRISONE) cream Apply 1 application topically 2 (two) times daily. (Patient taking differently: Apply 1 application  topically as needed.)   Desoximetasone (TOPICORT) 0.25 % ointment Apply 1 Application topically as needed.   fluticasone (FLONASE) 50 MCG/ACT nasal spray Place into the nose.   hydrochlorothiazide (HYDRODIURIL) 25 MG tablet Take 1 tablet (25 mg total) by mouth daily.   hydrocortisone (ANUSOL-HC) 2.5 % rectal cream Place 1 Application rectally as needed.   latanoprost (XALATAN) 0.005 % ophthalmic solution Place 1 drop into both eyes at bedtime.   levothyroxine (SYNTHROID) 112 MCG tablet Take 1 tablet (112 mcg total) by mouth daily before breakfast.   Multiple Vitamins-Minerals (CENTRUM SILVER ULTRA WOMENS PO) Take 1 tablet by  mouth daily.   Omega-3 Fatty Acids (FISH OIL) 1000 MG CAPS Take 1,000 mg by mouth daily.   polyethylene glycol powder (GLYCOLAX/MIRALAX) 17 GM/SCOOP powder Take 17 g by mouth 2 (two) times daily as needed.   timolol (TIMOPTIC) 0.5 % ophthalmic solution Place 1 drop into the left eye daily.   vitamin C (ASCORBIC ACID) 500 MG tablet Take 500 mg by mouth daily.   No facility-administered  encounter medications on file as of 06/09/2023.    Allergies (verified) Chlorhexidine, Amoxicillin, Eryc [erythromycin], and Sulfa antibiotics   History: Past Medical History:  Diagnosis Date   Glaucoma    History of stroke 05/23/2020   Hyperlipidemia    Hypertension    Hypothyroidism 10/02/2014   Overactive bladder    Pneumonia    PONV (postoperative nausea and vomiting)    extreme headaches also   Squamous cell skin cancer, face    Stage 3a chronic kidney disease (CKD) (HCC)    Stroke (HCC) 02/2020   speech stroke   TIA (transient ischemic attack) 03/10/2020   Past Surgical History:  Procedure Laterality Date   BREAST BIOPSY Right 08/04/2022   Right Breast Stereo, Ribbon clip- path pending   BREAST BIOPSY Right 08/04/2022   MM RT BREAST BX W LOC DEV 1ST LESION IMAGE BX SPEC STEREO GUIDE 08/04/2022 [facility]   BREAST BIOPSY Right 08/18/2022   MM RT RADIO FREQUENCY TAG LOC MAMMO GUIDE 08/18/2022 [facility]   BREAST LUMPECTOMY WITH RADIOFREQUENCY TAG IDENTIFICATION Right 09/01/2022   Procedure: BREAST LUMPECTOMY WITH RADIOFREQUENCY TAG IDENTIFICATION;  Surgeon: Campbell Lerner, MD;  Location: [facility];  Service: General;  Laterality: Right;   BREAST SURGERY Bilateral    cysts removal   CATARACT EXTRACTION W/ INTRAOCULAR LENS  IMPLANT, BILATERAL     PARTIAL HYSTERECTOMY  1982   SURGICAL EXCISION OF EXCESSIVE SKIN Bilateral    x4 1960's and 1970's   THYROIDECTOMY, PARTIAL Right 1984   Family History  Problem Relation Age of Onset   Hypertension Mother    Diabetes Father    Colon cancer Father    Breast cancer Grandchild    Social History   Socioeconomic History   Marital status: Widowed    Spouse name: Not on file   Number of children: 2   Years of education: Not on file   Highest education level: Associate degree: occupational, Scientist, product/process development, or vocational program  Occupational History   Not on file  Tobacco Use   Smoking status: Never    Passive  exposure: Never   Smokeless tobacco: Never  Vaping Use   Vaping status: Never Used  Substance and Sexual Activity   Alcohol use: No   Drug use: No   Sexual activity: Not Currently    Birth control/protection: Surgical  Other Topics Concern   Not on file  Social History Narrative   Lives alone   Social Drivers of Health   Financial Resource Strain: Low Risk  (06/09/2023)   Overall Financial Resource Strain (CARDIA)    Difficulty of Paying Living Expenses: Not hard at all  Food Insecurity: No Food Insecurity (06/09/2023)   Hunger Vital Sign    Worried About Running Out of Food in the Last Year: Never true    Ran Out of Food in the Last Year: Never true  Transportation Needs: No Transportation Needs (06/09/2023)   PRAPARE - Administrator, Civil Service (Medical): No    Lack of Transportation (Non-Medical): No  Physical Activity: Insufficiently Active (06/09/2023)   Exercise  Vital Sign    Days of Exercise per Week: 7 days    Minutes of Exercise per Session: 10 min  Stress: No Stress Concern Present (06/09/2023)   [employer] of Occupational Health - Occupational Stress Questionnaire    Feeling of Stress : Only a little  Social Connections: Moderately Isolated (06/09/2023)   Social Connection and Isolation Panel [NHANES]    Frequency of Communication with Friends and Family: More than three times a week    Frequency of Social Gatherings with Friends and Family: More than three times a week    Attends Religious Services: More than 4 times per year    Active Member of [employer] or Organizations: No    Attends Banker Meetings: Never    Marital Status: Widowed    Tobacco Counseling Counseling given: Not Answered    Clinical Intake:  Pre-visit preparation completed: Yes  Pain : No/denies pain     BMI - recorded: 25.81 Nutritional Status: BMI 25 -29 Overweight Nutritional Risks: None Diabetes: No  How often do you need to have someone help you  when you read instructions, pamphlets, or other written materials from your doctor or pharmacy?: 1 - Never  Interpreter Needed?: No  Information entered by :: Tora Kindred, CMA   Activities of Daily Living     06/09/2023    3:59 PM 06/06/2023    9:11 PM  In your present state of health, do you have any difficulty performing the following activities:  Hearing? 0 0  Vision? 0 0  Difficulty concentrating or making decisions? 0 0  Walking or climbing stairs? 1 1  Comment uses cane outside   Dressing or bathing? 0 0  Doing errands, shopping? 0 0  Preparing Food and eating ? N N  Using the Toilet? N N  In the past six months, have you accidently leaked urine? Malvin Johns  Comment wears a pad   Do you have problems with loss of bowel control? N N  Managing your Medications? N N  Managing your Finances? N N  Housekeeping or managing your Housekeeping? N N    Patient Care Team: Dorcas Carrow, DO as PCP - General (Family Medicine) Debbrah Alar, MD (Dermatology) Linus Galas, DPM (Podiatry) Gwyneth Revels, DPM as Referring Physician (Podiatry) Carmina Miller, MD as Consulting Physician (Radiation Oncology) Creig Hines, MD as Consulting Physician (Oncology) Campbell Lerner, MD as Consulting Physician (General Surgery)  Indicate any recent Medical Services you may have received from other than [facility] providers in the past year (date may be approximate).     Assessment:   This is a routine wellness examination for Azadeh.  Hearing/Vision screen Hearing Screening - Comments:: Denies hearing loss Vision Screening - Comments:: Gets eye exams, Dr. Julianne Rice Pratt   Goals Addressed             This Visit's Progress    Patient Stated       Get a routine exercise plan       Depression Screen     06/09/2023    4:08 PM 04/04/2023   10:02 AM 12/29/2022   11:51 AM 09/28/2022    9:03 AM 08/10/2022    3:11 PM 06/29/2022    9:46 AM 05/31/2022   10:33 AM  PHQ 2/9 Scores  PHQ - 2  Score 0 0 1 0 0 0 0  PHQ- 9 Score 1 1 3 2  2  0    Fall Risk  06/09/2023    4:12 PM 06/06/2023    9:11 PM 04/04/2023   10:01 AM 12/29/2022   11:51 AM 09/28/2022    9:03 AM  Fall Risk   Falls in the past year? 0 0 0 0 0  Number falls in past yr: 0 0 0 0 0  Injury with Fall? 0 0 0 0 0  Risk for fall due to : No Fall Risks  No Fall Risks  No Fall Risks  Follow up Falls prevention discussed;Falls evaluation completed  Falls evaluation completed Falls evaluation completed Falls evaluation completed    MEDICARE RISK AT HOME:  Medicare Risk at Home Any stairs in or around the home?: Yes If so, are there any without handrails?: No Home free of loose throw rugs in walkways, pet beds, electrical cords, etc?: Yes Adequate lighting in your home to reduce risk of falls?: Yes Life alert?: Yes Use of a cane, walker or w/c?: Yes (uses cane outside only) Grab bars in the bathroom?: Yes Shower chair or bench in shower?: No Elevated toilet seat or a handicapped toilet?: Yes  TIMED UP AND GO:  Was the test performed?  No  Cognitive Function: 6CIT completed        06/09/2023    4:13 PM 05/31/2022   10:42 AM 04/28/2020   11:23 AM 11/14/2017   10:48 AM 11/03/2016   10:50 AM  6CIT Screen  What Year? 0 points 0 points 0 points 0 points 0 points  What month? 0 points 0 points 0 points 0 points 0 points  What time? 0 points 0 points 0 points 0 points 0 points  Count back from 20 0 points 0 points 0 points 0 points 0 points  Months in reverse 0 points 0 points 0 points 0 points 0 points  Repeat phrase 0 points 0 points 0 points 0 points 0 points  Total Score 0 points 0 points 0 points 0 points 0 points    Immunizations Immunization History  Administered Date(s) Administered   Fluad Quad(high Dose 65+) 11/27/2018, 11/29/2019, 01/23/2021, 12/23/2021   Fluad Trivalent(High Dose 65+) 12/29/2022   Influenza, High Dose Seasonal PF 01/30/2016, 05/13/2017, 11/24/2017   Influenza,inj,Quad PF,6+ Mos  01/21/2015   Influenza-Unspecified 11/24/2017   PFIZER(Purple Top)SARS-COV-2 Vaccination 04/20/2019, 05/11/2019, 03/18/2020   Pneumococcal Conjugate-13 10/01/2013   Pneumococcal Polysaccharide-23 06/06/2006, 09/28/2006   Td 09/28/2006, 11/10/2016    Screening Tests Health Maintenance  Topic Date Due   Zoster Vaccines- Shingrix (1 of 2) Never done   COVID-19 Vaccine (4 - 2024-25 season) 11/28/2022   Medicare Annual Wellness (AWV)  06/08/2024   DTaP/Tdap/Td (3 - Tdap) 11/11/2026   Pneumonia Vaccine 18+ Years old  Completed   INFLUENZA VACCINE  Completed   DEXA SCAN  Completed   HPV VACCINES  Aged Out    Health Maintenance  Health Maintenance Due  Topic Date Due   Zoster Vaccines- Shingrix (1 of 2) Never done   COVID-19 Vaccine (4 - 2024-25 season) 11/28/2022   Health Maintenance Items Addressed: See Nurse Notes  Additional Screening:  Vision Screening: Recommended annual ophthalmology exams for early detection of glaucoma and other disorders of the eye.  Dental Screening: Recommended annual dental exams for proper oral hygiene  Community Resource Referral / Chronic Care Management: CRR required this visit?  No   CCM required this visit?  No     Plan:     I have personally reviewed and noted the following in the patient's chart:   Medical and  social history Use of alcohol, tobacco or illicit drugs  Current medications and supplements including opioid prescriptions. Patient is not currently taking opioid prescriptions. Functional ability and status Nutritional status Physical activity Advanced directives List of other physicians Hospitalizations, surgeries, and ER visits in previous 12 months Vitals Screenings to include cognitive, depression, and falls Referrals and appointments  In addition, I have reviewed and discussed with patient certain preventive protocols, quality metrics, and best practice recommendations. A written personalized care plan for  preventive services as well as general preventive health recommendations were provided to patient.     Tora Kindred, CMA   06/09/2023   After Visit Summary: (MyChart) Due to this being a telephonic visit, the after visit summary with patients personalized plan was offered to patient via MyChart   Notes:  Declined shingles and covid vaccines Has MMG scheduled 07/08/23-followed by oncology Colonoscopy and DEXA scan not recommended due to age

## 2023-06-09 NOTE — Patient Instructions (Addendum)
 Cassie Brewer , Thank you for taking time to come for your Medicare Wellness Visit. I appreciate your ongoing commitment to your health goals. Please review the following plan we discussed and let me know if I can assist you in the future.   Referrals/Orders/Follow-Ups/Clinician Recommendations: Keep up the great work!!  This is a list of the screening recommended for you and due dates:  Health Maintenance  Topic Date Due   Zoster (Shingles) Vaccine (1 of 2) Never done   COVID-19 Vaccine (4 - 2024-25 season) 11/28/2022   Medicare Annual Wellness Visit  06/08/2024   DTaP/Tdap/Td vaccine (3 - Tdap) 11/11/2026   Pneumonia Vaccine  Completed   Flu Shot  Completed   DEXA scan (bone density measurement)  Completed   HPV Vaccine  Aged Out    Advanced directives: (Copy Requested) Please bring a copy of your health care power of attorney and living will to the office to be added to your chart at your convenience. You can mail to [facility] [address] [place], [place] [address] or email to [email or web address]  Next Medicare Annual Wellness Visit scheduled for next year: Yes, 06/21/24 @ 3:50pm (phone visit)

## 2023-06-13 ENCOUNTER — Ambulatory Visit
Admission: RE | Admit: 2023-06-13 | Discharge: 2023-06-13 | Disposition: A | Discharge status: Home / Self Care | Payer: PPO | Source: Ambulatory Visit | Attending: Radiation Oncology | Admitting: Radiation Oncology

## 2023-06-13 ENCOUNTER — Encounter: Payer: Self-pay | Admitting: Radiation Oncology

## 2023-06-13 VITALS — BP 138/79 | HR 69 | Temp 97.4°F | Resp 12 | Wt 144.0 lb

## 2023-06-13 DIAGNOSIS — Z923 Personal history of irradiation: Secondary | ICD-10-CM | POA: Diagnosis not present

## 2023-06-13 DIAGNOSIS — Z170 Estrogen receptor positive status [ER+]: Secondary | ICD-10-CM | POA: Diagnosis not present

## 2023-06-13 DIAGNOSIS — D0511 Intraductal carcinoma in situ of right breast: Secondary | ICD-10-CM | POA: Insufficient documentation

## 2023-06-13 NOTE — Progress Notes (Signed)
 Radiation Oncology Follow up Note  Name: Cassie Brewer   Date:   06/13/2023 MRN:  [id number] DOB: 1929-03-01    This 89 y.o. female presents to the clinic today for 76-month follow-up status post whole breast radiation to her right breast for ER positive ductal carcinoma in situ  REFERRING PROVIDER: Dorcas Carrow, DO  HPI: Patient is a 89 year old female now out 7 months having completed whole breast radiation to her right breast for ER positive ductal carcinoma in situ.  Seen today in routine follow-up she is doing well.  She specifically denies breast tenderness cough or bone pain.  She has not yet had.  Repeat imaging studies.  She is not on endocrine therapy.  COMPLICATIONS OF TREATMENT: none  FOLLOW UP COMPLIANCE: keeps appointments   PHYSICAL EXAM:  BP 138/79   Pulse 69   Temp (!) 97.4 F (36.3 C) (Tympanic)   Resp 12   Wt 144 lb (65.3 kg)   LMP 05/13/1980   BMI 25.11 kg/m  Lungs are clear to A&P cardiac examination essentially unremarkable with regular rate and rhythm. No dominant mass or nodularity is noted in either breast in 2 positions examined. Incision is well-healed. No axillary or supraclavicular adenopathy is appreciated. Cosmetic result is excellent.  Well-developed well-nourished patient in NAD. HEENT reveals PERLA, EOMI, discs not visualized.  Oral cavity is clear. No oral mucosal lesions are identified. Neck is clear without evidence of cervical or supraclavicular adenopathy. Lungs are clear to A&P. Cardiac examination is essentially unremarkable with regular rate and rhythm without murmur rub or thrill. Abdomen is benign with no organomegaly or masses noted. Motor sensory and DTR levels are equal and symmetric in the upper and lower extremities. Cranial nerves II through XII are grossly intact. Proprioception is intact. No peripheral adenopathy or edema is identified. No motor or sensory levels are noted. Crude visual fields are within normal range.  RADIOLOGY  RESULTS: No current films for review  PLAN: Time patient is doing well 7 months out from whole breast radiation and pleased with her overall progress.  Based on her age I am going to turn follow-up care over to medical oncology.  I be happy to reevaluate her at any time should that be indicated.  Patient knows to call with any concerns.  I would like to take this opportunity to thank you for allowing me to participate in the care of your patient.Carmina Miller, MD

## 2023-07-05 ENCOUNTER — Encounter: Payer: Self-pay | Admitting: Family Medicine

## 2023-07-05 ENCOUNTER — Ambulatory Visit (INDEPENDENT_AMBULATORY_CARE_PROVIDER_SITE_OTHER): Payer: Self-pay | Admitting: Family Medicine

## 2023-07-05 VITALS — BP 132/82 | HR 57 | Temp 97.6°F | Ht 63.0 in | Wt 149.2 lb

## 2023-07-05 DIAGNOSIS — I129 Hypertensive chronic kidney disease with stage 1 through stage 4 chronic kidney disease, or unspecified chronic kidney disease: Secondary | ICD-10-CM | POA: Diagnosis not present

## 2023-07-05 DIAGNOSIS — E039 Hypothyroidism, unspecified: Secondary | ICD-10-CM | POA: Diagnosis not present

## 2023-07-05 MED ORDER — ATORVASTATIN CALCIUM 20 MG PO TABS: 20.0000 mg | ORAL_TABLET | Freq: Every day | ORAL | 0 refills | Status: DC

## 2023-07-05 MED ORDER — HYDROCHLOROTHIAZIDE 25 MG PO TABS: 25.0000 mg | ORAL_TABLET | Freq: Every day | ORAL | 0 refills | Status: DC

## 2023-07-05 MED ORDER — BENAZEPRIL HCL 40 MG PO TABS: 40.0000 mg | ORAL_TABLET | Freq: Every day | ORAL | 0 refills | Status: DC

## 2023-07-05 NOTE — Assessment & Plan Note (Signed)
 Under good control on current regimen. Continue current regimen. Continue to monitor. Call with any concerns. Refills given.

## 2023-07-05 NOTE — Progress Notes (Signed)
 BP 132/82   Pulse (!) 57   Temp 97.6 F (36.4 C) (Oral)   Ht 5\' 3"  (1.6 m)   Wt 149 lb 3.2 oz (67.7 kg)   LMP 05/13/1980   SpO2 95%   BMI 26.43 kg/m    Subjective:    Patient ID: Cassie Brewer, female    DOB: 1928-07-03, 89 y.o.   MRN: [id number]  HPI: Cassie Brewer is a 89 y.o. female  Chief Complaint  Patient presents with   Hypertension   HYPERTENSION  Hypertension status: controlled  Satisfied with current treatment? yes Duration of hypertension: chronic BP monitoring frequency:  rarely BP medication side effects:  no Medication compliance: excellent compliance Previous BP meds:benazepril, hctz Aspirin: yes Recurrent headaches: no Visual changes: no Palpitations: no Dyspnea: no Chest pain: no Lower extremity edema: no Dizzy/lightheaded: no  HYPOTHYROIDISM Thyroid control status:controlled Satisfied with current treatment? yes Medication side effects: no Medication compliance: excellent compliance Recent dose adjustment:no Fatigue: no Cold intolerance: no Heat intolerance: no Weight gain: no Weight loss: no Constipation: yes Diarrhea/loose stools: no Palpitations: no Lower extremity edema: no Anxiety/depressed mood: no  Relevant past medical, surgical, family and social history reviewed and updated as indicated. Interim medical history since our last visit reviewed. Allergies and medications reviewed and updated.  Review of Systems  Constitutional: Negative.   Respiratory: Negative.    Cardiovascular: Negative.   Gastrointestinal: Negative.   Musculoskeletal: Negative.   Psychiatric/Behavioral: Negative.      Per HPI unless specifically indicated above     Objective:    BP 132/82   Pulse (!) 57   Temp 97.6 F (36.4 C) (Oral)   Ht 5\' 3"  (1.6 m)   Wt 149 lb 3.2 oz (67.7 kg)   LMP 05/13/1980   SpO2 95%   BMI 26.43 kg/m   Wt Readings from Last 3 Encounters:  07/05/23 149 lb 3.2 oz (67.7 kg)  06/13/23 144 lb (65.3 kg)  06/09/23  148 lb (67.1 kg)    Physical Exam Vitals and nursing note reviewed.  Constitutional:      General: She is not in acute distress.    Appearance: Normal appearance. She is not ill-appearing, toxic-appearing or diaphoretic.  HENT:     Head: Normocephalic and atraumatic.     Right Ear: External ear normal.     Left Ear: External ear normal.     Nose: Nose normal.     Mouth/Throat:     Mouth: Mucous membranes are moist.     Pharynx: Oropharynx is clear.  Eyes:     General: No scleral icterus.       Right eye: No discharge.        Left eye: No discharge.     Extraocular Movements: Extraocular movements intact.     Conjunctiva/sclera: Conjunctivae normal.     Pupils: Pupils are equal, round, and reactive to light.  Cardiovascular:     Rate and Rhythm: Normal rate and regular rhythm.     Pulses: Normal pulses.     Heart sounds: Normal heart sounds. No murmur heard.    No friction rub. No gallop.  Pulmonary:     Effort: Pulmonary effort is normal. No respiratory distress.     Breath sounds: Normal breath sounds. No stridor. No wheezing, rhonchi or rales.  Chest:     Chest wall: No tenderness.  Musculoskeletal:        General: Normal range of motion.     Cervical back: Normal range  of motion and neck supple.  Skin:    General: Skin is warm and dry.     Capillary Refill: Capillary refill takes less than 2 seconds.     Coloration: Skin is not jaundiced or pale.     Findings: No bruising, erythema, lesion or rash.  Neurological:     General: No focal deficit present.     Mental Status: She is alert and oriented to person, place, and time. Mental status is at baseline.  Psychiatric:        Mood and Affect: Mood normal.        Behavior: Behavior normal.        Thought Content: Thought content normal.        Judgment: Judgment normal.     Results for orders placed or performed in visit on 05/20/23  TSH   Collection Time: 05/20/23 11:38 AM  Result Value Ref Range   TSH 2.400  0.450 - 4.500 uIU/mL      Assessment & Plan:   Problem List Items Addressed This Visit       Endocrine   Hypothyroidism - Primary   Under good control. Continue to monitor. Call with any concerns.         Genitourinary   Benign hypertensive renal disease   Under good control on current regimen. Continue current regimen. Continue to monitor. Call with any concerns. Refills given.        Relevant Medications   benazepril (LOTENSIN) 40 MG tablet   hydrochlorothiazide (HYDRODIURIL) 25 MG tablet     Follow up plan: Return in about 3 months (around 10/04/2023).

## 2023-07-05 NOTE — Assessment & Plan Note (Signed)
Under good control. Continue to monitor. Call with any concerns.  

## 2023-07-08 ENCOUNTER — Ambulatory Visit
Admission: RE | Admit: 2023-07-08 | Discharge: 2023-07-08 | Disposition: A | Discharge status: Home / Self Care | Payer: PPO | Source: Ambulatory Visit | Attending: Oncology | Admitting: Oncology

## 2023-07-08 DIAGNOSIS — Z08 Encounter for follow-up examination after completed treatment for malignant neoplasm: Secondary | ICD-10-CM

## 2023-07-08 DIAGNOSIS — R92323 Mammographic fibroglandular density, bilateral breasts: Secondary | ICD-10-CM | POA: Diagnosis not present

## 2023-07-08 DIAGNOSIS — Z86000 Personal history of in-situ neoplasm of breast: Secondary | ICD-10-CM | POA: Diagnosis not present

## 2023-07-08 DIAGNOSIS — R921 Mammographic calcification found on diagnostic imaging of breast: Secondary | ICD-10-CM | POA: Diagnosis not present

## 2023-07-08 HISTORY — DX: Personal history of irradiation: Z92.3

## 2023-07-08 HISTORY — DX: Malignant neoplasm of unspecified site of unspecified female breast: C50.919

## 2023-08-30 DIAGNOSIS — H5203 Hypermetropia, bilateral: Secondary | ICD-10-CM | POA: Diagnosis not present

## 2023-08-30 DIAGNOSIS — H26493 Other secondary cataract, bilateral: Secondary | ICD-10-CM | POA: Diagnosis not present

## 2023-08-30 DIAGNOSIS — H401131 Primary open-angle glaucoma, bilateral, mild stage: Secondary | ICD-10-CM | POA: Diagnosis not present

## 2023-08-30 DIAGNOSIS — H353131 Nonexudative age-related macular degeneration, bilateral, early dry stage: Secondary | ICD-10-CM | POA: Diagnosis not present

## 2023-09-05 ENCOUNTER — Ambulatory Visit: Payer: Self-pay | Admitting: *Deleted

## 2023-09-05 NOTE — Telephone Encounter (Signed)
 FYI Only or Action Required?: FYI only for provider  Patient was last seen in primary care on 07/05/2023 by Terre Ferri P, DO. Called Nurse Triage reporting Urinary Frequency. Symptoms began several weeks ago. Interventions attempted: OTC medications: used AZO about 1 week ago. Symptoms are: gradually worsening.  Triage Disposition: See Physician Within 24 Hours  Patient/caregiver understands and will follow disposition?: yes    Reason for Disposition  Urinating more frequently than usual (i.e., frequency)  Answer Assessment - Initial Assessment Questions 1. SYMPTOM: "What's the main symptom you're concerned about?" (e.g., frequency, incontinence)     Incontinence, urgency 2. ONSET: "When did the  urinary symptoms  start?"     3 weeks 3. PAIN: "Is there any pain?" If Yes, ask: "How bad is it?" (Scale: 1-10; mild, moderate, severe)     Burning with urination,9/10 4. CAUSE: "What do you think is causing the symptoms?"     Possible UTI 5. OTHER SYMPTOMS: "Do you have any other symptoms?" (e.g., blood in urine, fever, flank pain, pain with urination)     Urgency/pressure to BM  Protocols used: Urinary Symptoms-A-AH    Copied from CRM [id number]. Topic: Clinical - Red Word Triage >> Sep 05, 2023  2:05 PM Ivette P wrote: Red Word that prompted transfer to Nurse Triage: pt has a UTI. soemthing going on with bladder stem. bowel urgency, going on for 3 weeks. very hard urgency. wet two pairs of pajamas, urgnecy cant contro. and burning with urination

## 2023-09-06 ENCOUNTER — Encounter: Payer: Self-pay | Admitting: Pediatrics

## 2023-09-06 ENCOUNTER — Ambulatory Visit: Admitting: Podiatry

## 2023-09-06 ENCOUNTER — Ambulatory Visit (INDEPENDENT_AMBULATORY_CARE_PROVIDER_SITE_OTHER): Admitting: Pediatrics

## 2023-09-06 VITALS — BP 148/81 | HR 61 | Temp 97.8°F | Wt 147.2 lb

## 2023-09-06 DIAGNOSIS — M79675 Pain in left toe(s): Secondary | ICD-10-CM

## 2023-09-06 DIAGNOSIS — R399 Unspecified symptoms and signs involving the genitourinary system: Secondary | ICD-10-CM

## 2023-09-06 DIAGNOSIS — I129 Hypertensive chronic kidney disease with stage 1 through stage 4 chronic kidney disease, or unspecified chronic kidney disease: Secondary | ICD-10-CM | POA: Diagnosis not present

## 2023-09-06 DIAGNOSIS — N949 Unspecified condition associated with female genital organs and menstrual cycle: Secondary | ICD-10-CM | POA: Diagnosis not present

## 2023-09-06 DIAGNOSIS — B351 Tinea unguium: Secondary | ICD-10-CM

## 2023-09-06 DIAGNOSIS — M79674 Pain in right toe(s): Secondary | ICD-10-CM

## 2023-09-06 LAB — URINALYSIS, ROUTINE W REFLEX MICROSCOPIC
Bilirubin, UA: NEGATIVE
Glucose, UA: NEGATIVE
Ketones, UA: NEGATIVE
Nitrite, UA: NEGATIVE
Protein,UA: NEGATIVE
RBC, UA: NEGATIVE
Specific Gravity, UA: 1.02 (ref 1.005–1.030)
Urobilinogen, Ur: 1 mg/dL (ref 0.2–1.0)
pH, UA: 7 (ref 5.0–7.5)

## 2023-09-06 LAB — WET PREP FOR TRICH, YEAST, CLUE
Clue Cell Exam: NEGATIVE
Trichomonas Exam: NEGATIVE
Yeast Exam: NEGATIVE

## 2023-09-06 LAB — MICROSCOPIC EXAMINATION
Bacteria, UA: NONE SEEN
RBC, Urine: NONE SEEN /HPF (ref 0–2)

## 2023-09-06 MED ORDER — NITROFURANTOIN MONOHYD MACRO 100 MG PO CAPS: 100.0000 mg | ORAL_CAPSULE | Freq: Two times a day (BID) | ORAL | 0 refills | Status: AC

## 2023-09-06 NOTE — Progress Notes (Signed)
  Subjective:  Patient ID: Cassie Brewer, female    DOB: [birth year],  MRN: [id number]  Chief Complaint  Patient presents with   Nail Problem    Nail trim   89 y.o. female returns for the above complaint.  Patient presents with thickened elongated mycotic toenails x10.  Patient is especially concerned for right hallux onychomycosis.  Patient not able to debride on herself.  She would like for [place] to debride down.  She does not have any secondary complaints. Objective:   There were no vitals filed for this visit. Podiatric Exam: Vascular: dorsalis pedis and posterior tibial pulses are palpable bilateral. Capillary return is immediate. Temperature gradient is WNL. Skin turgor WNL  Sensorium: Normal Semmes Weinstein monofilament test. Normal tactile sensation bilaterally.  However she experiences subjective neuropathic pain to the distal tip of the toes.  Intact sensation Nail Exam: Pt has thick disfigured discolored nails with subungual debris noted bilateral entire nail hallux through fifth toenails.  Ingrown noted to the right hallux lateral border.  Mild pain on palpation. Ulcer Exam: There is no evidence of ulcer or pre-ulcerative changes or infection. Orthopedic Exam: Muscle tone and strength are WNL. No limitations in general ROM. No crepitus or effusions noted. HAV  B/L.  Hammer toes 2-5  B/L. Skin: No Porokeratosis. No infection or ulcers.  Xerosis/psoriasis of the plantar foot with now subjective component of itching..  There is mild cracking noted.  No bleeding noted.  Assessment & Plan:  Patient was evaluated and treated and all questions answered.  Athlete's foot bilateral -Explained to patient the etiology of athlete's foot and various treatment options were discussed.  Given that there is subjective complaint of itching associated with the bottom of the foot I believe she will benefit from Lotrisone cream.  I have asked her to apply twice a day.  She states  understanding.  Neuropathic pain -doing well  Xerosis/psoriasis -I explained to the patient the etiology of dryness with cracking as well as various treatment options associated with it.  Given that she is doing good with clotrimazole cream that was given to her by dermatologist and had diagnosed her with psoriasis, I believe she can continue using the cream.  Patient agrees with this plan.  Onychomycosis with pain  -Nails palliatively debrided as below. -Educated on self-care  Procedure: Nail Debridement Rationale: pain  Type of Debridement: manual, sharp debridement. Instrumentation: Nail nipper, rotary burr. Number of Nails: 10  Procedures and Treatment: Consent by patient was obtained for treatment procedures. The patient understood the discussion of treatment and procedures well. All questions were answered thoroughly reviewed. Debridement of mycotic and hypertrophic toenails, 1 through 5 bilateral and clearing of subungual debris. No ulceration, no infection noted.  Return Visit-Office Procedure: Patient instructed to return to the office for a follow up visit 3 months for continued evaluation and treatment.  Nicholes Rough, DPM    No follow-ups on file.

## 2023-09-07 ENCOUNTER — Ambulatory Visit: Payer: Self-pay | Admitting: Pediatrics

## 2023-09-08 ENCOUNTER — Encounter: Payer: Self-pay | Admitting: Pediatrics

## 2023-09-08 NOTE — Assessment & Plan Note (Signed)
 Blood pressure borderline, likely due to white coat syndrome. Home readings reportedly good. - Advised regular home blood pressure monitoring. - Instructed to contact office if home readings consistently high.

## 2023-09-08 NOTE — Progress Notes (Signed)
 Office Visit  BP (!) 148/81   Pulse 61   Temp 97.8 F (36.6 C) (Oral)   Wt 147 lb 3.2 oz (66.8 kg)   LMP 05/13/1980   SpO2 100%   BMI 26.08 kg/m    Subjective:    Patient ID: Cassie Brewer, female    DOB: 21-Feb-1929, 89 y.o.   MRN: [id number]  HPI: Cassie Brewer is a 89 y.o. female  Chief Complaint  Patient presents with   Dysuria    Symptoms have been ongoing for the past few weeks    Arm Pain    Started end of march when she started doing yard work    Discussed the use of AI scribe software for clinical note transcription with the patient, who gave verbal consent to proceed.  History of Present Illness   Cassie Brewer is a 89 year old female who presents with urinary symptoms suggestive of a urinary tract infection (UTI).  She has been experiencing extreme pressure to urinate, particularly at night, causing her to wake up two to three times. This urgency and incontinence have led to multiple changes of pajama bottoms and pads before reaching the toilet.  The symptoms began around May 17th and worsened by May 18th, coinciding with a family visit and a trip to the beach. She took over-the-counter UTI medication provided by her daughter-in-law for three days, which she believes may have affected her urine test results.  She experiences pain and pressure across her lower abdomen. Despite increased water  intake, the symptoms persist. She has not taken any more over-the-counter UTI pills since the initial three-day course. She also takes ibuprofen  for shoulder pain.  On Sunday, she felt particularly unwell, leading her to leave church early, an action she has never taken before.  No fever or other systemic symptoms, but significant urinary urgency and incontinence.      Relevant past medical, surgical, family and social history reviewed and updated as indicated. Interim medical history since our last visit reviewed. Allergies and medications reviewed and updated.  ROS  per HPI unless specifically indicated above     Objective:    BP (!) 148/81   Pulse 61   Temp 97.8 F (36.6 C) (Oral)   Wt 147 lb 3.2 oz (66.8 kg)   LMP 05/13/1980   SpO2 100%   BMI 26.08 kg/m   Wt Readings from Last 3 Encounters:  09/06/23 147 lb 3.2 oz (66.8 kg)  07/05/23 149 lb 3.2 oz (67.7 kg)  06/13/23 144 lb (65.3 kg)     Physical Exam Constitutional:      Appearance: Normal appearance.  Pulmonary:     Effort: Pulmonary effort is normal.  Abdominal:     General: Abdomen is flat.     Palpations: Abdomen is soft.     Tenderness: There is no right CVA tenderness or left CVA tenderness.   Musculoskeletal:        General: Normal range of motion.   Skin:    Comments: Normal skin color   Neurological:     General: No focal deficit present.     Mental Status: She is alert. Mental status is at baseline.   Psychiatric:        Mood and Affect: Mood normal.        Behavior: Behavior normal.        Thought Content: Thought content normal.         07/05/2023    9:53 AM 06/09/2023  4:08 PM 04/04/2023   10:02 AM 12/29/2022   11:51 AM 09/28/2022    9:03 AM  Depression screen PHQ 2/9  Decreased Interest 0 0 0 1 0  Down, Depressed, Hopeless 0 0 0 0 0  PHQ - 2 Score 0 0 0 1 0  Altered sleeping 1 0 0 0 0  Tired, decreased energy 1 1 0 1 1  Change in appetite 0 0 0 0 0  Feeling bad or failure about yourself  0 0 0 0 0  Trouble concentrating 0 0 0 0 0  Moving slowly or fidgety/restless 1 0 1 1 1   Suicidal thoughts 0 0 0 0 0  PHQ-9 Score 3 1 1 3 2   Difficult doing work/chores Not difficult at all Not difficult at all Not difficult at all Not difficult at all        07/05/2023    9:53 AM 04/04/2023   10:02 AM 12/29/2022   11:52 AM 09/28/2022    9:03 AM  GAD 7 : Generalized Anxiety Score  Nervous, Anxious, on Edge 0 0 0 1  Control/stop worrying 0 0 0 0  Worry too much - different things 0 0 0 0  Trouble relaxing 0 0 0 0  Restless 0 0 0 0  Easily annoyed or irritable 0  0 0 0  Afraid - awful might happen 0 0 0 0  Total GAD 7 Score 0 0 0 1  Anxiety Difficulty  Not difficult at all Not difficult at all Not difficult at all       Assessment & Plan:  Assessment & Plan   Urinary tract infection symptoms Symptoms and subtle 1+leukocytes suggest UTI. Did take azo which suspect reason for relatively clear U/A. Sx persist. Urine culture pending. Risk of progression if untreated. - Prescribed Macrobid 100 mg twice daily for 5 days. - Advised to start antibiotic today with food, preferably breakfast and dinner. - Advised adequate hydration. - Instructed to contact office if no improvement by Thursday or Friday. - Advised to complete full course of antibiotics. - Informed office will contact if culture indicates different antibiotic needed. -     Urine Culture -     Urinalysis, Routine w reflex microscopic -     Nitrofurantoin Monohyd Macro; Take 1 capsule (100 mg total) by mouth 2 (two) times daily for 5 days.  Dispense: 10 capsule; Refill: 0  Vaginal discomfort Neg swab. -     WET PREP FOR TRICH, YEAST, CLUE  Benign hypertensive renal disease Assessment & Plan: Blood pressure borderline, likely due to white coat syndrome. Home readings reportedly good. - Advised regular home blood pressure monitoring. - Instructed to contact office if home readings consistently high.   Follow up plan: Return if symptoms worsen or fail to improve.  Hadassah Letters, MD

## 2023-09-10 LAB — URINE CULTURE

## 2023-09-13 ENCOUNTER — Inpatient Hospital Stay: Payer: PPO | Attending: Oncology | Admitting: Oncology

## 2023-09-13 ENCOUNTER — Encounter: Payer: Self-pay | Admitting: Oncology

## 2023-09-13 DIAGNOSIS — Z853 Personal history of malignant neoplasm of breast: Secondary | ICD-10-CM | POA: Diagnosis not present

## 2023-09-13 DIAGNOSIS — Z08 Encounter for follow-up examination after completed treatment for malignant neoplasm: Secondary | ICD-10-CM | POA: Diagnosis not present

## 2023-09-13 DIAGNOSIS — Z86000 Personal history of in-situ neoplasm of breast: Secondary | ICD-10-CM | POA: Diagnosis not present

## 2023-09-13 NOTE — Progress Notes (Signed)
 Hematology/Oncology Consult note [facility]  [phone] [phone]  Patient Care Team: Solomon Dupre, DO as PCP - General (Family Medicine) Isenstein, Arin L, MD (Dermatology) Glenis Langdon, MD as Consulting Physician (Radiation Oncology) Avonne Boettcher, MD as Consulting Physician (Oncology) Flynn Hylan, MD as Consulting Physician (General Surgery) Velma Ghazi, DPM as Consulting Physician (Podiatry) [facility], [facility] (Optometry) Bufford Carne, MD as Referring Physician (Neurology) Antonette Batters, MD as Consulting Physician (Cardiology)   Name of the patient: Cassie Brewer  [id number]  [birth year]   Date of visit: 09/13/23  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  Mammogram showed possible calcifications in the right breast.  This was followed by diagnostic mammogramWhich showed 6 mm group of calcifications in the right upper breast.  This was biopsied and was consistent with DCIS high-grade with comedonecrosis and calcifications.    Patient underwent right lumpectomy on 09/01/2022.  Pathology showed 16 mm grade 3 DCIS with necrosis.  DCIS present at inferior and posterior margins.  ER low +1 to 10%.  She completed adjuvant radiation therapy.  Given her age and low ER positivity adjuvant endocrine therapy was not recommended.    Interval history-patient is doing well for her age.  She denies any complaints or breast concerns at this time.  ECOG PS- 1 Pain scale- 0   Review of systems- Review of Systems  Constitutional:  Negative for chills, fever, malaise/fatigue and weight loss.  HENT:  Negative for  congestion, ear discharge and nosebleeds.   Eyes:  Negative for blurred vision.  Respiratory:  Negative for cough, hemoptysis, sputum production, shortness of breath and wheezing.   Cardiovascular:  Negative for chest pain, palpitations, orthopnea and claudication.  Gastrointestinal:  Negative for abdominal pain, blood in stool, constipation, diarrhea, heartburn, melena, nausea and vomiting.  Genitourinary:  Negative for dysuria, flank pain, frequency, hematuria and urgency.  Musculoskeletal:  Negative for back pain, joint pain and myalgias.  Skin:  Negative for rash.  Neurological:  Negative for dizziness, tingling, focal weakness, seizures, weakness and headaches.  Endo/Heme/Allergies:  Does not bruise/bleed easily.  Psychiatric/Behavioral:  Negative for depression and suicidal ideas. The patient does not have insomnia.       Allergies  Allergen Reactions   Chlorhexidine  Rash   Amoxicillin     Other reaction(s): Other (See Comments) blood pressure very high - almost passed out   Eryc [Erythromycin] Other (See Comments)   Sulfa  Antibiotics Nausea And Vomiting    Nausea with bactrim  in 2017       Past Medical History:  Diagnosis Date   Breast cancer (HCC)    06/2022   Glaucoma    History of stroke 05/23/2020   Hyperlipidemia    Hypertension    Hypothyroidism 10/02/2014   Overactive bladder    Personal history of radiation therapy    Pneumonia    PONV (postoperative nausea and vomiting)    extreme headaches also   Squamous cell skin cancer, face    Stage 3a chronic kidney disease (CKD) (HCC)    Stroke (HCC) 02/2020   speech stroke   TIA (transient ischemic attack) 03/10/2020  Past Surgical History:  Procedure Laterality Date   BREAST BIOPSY Right 08/04/2022   DCIS HG, Ribbon marker, positive surgical margins w/o re-excision   BREAST BIOPSY Right 08/18/2022   MM RT RADIO FREQUENCY TAG LOC MAMMO GUIDE 08/18/2022 [facility]   BREAST LUMPECTOMY WITH  RADIOFREQUENCY TAG IDENTIFICATION Right 09/01/2022   Procedure: BREAST LUMPECTOMY WITH RADIOFREQUENCY TAG IDENTIFICATION;  Surgeon: Flynn Hylan, MD;  Location: [facility] ORS;  Service: General;  Laterality: Right;   BREAST SURGERY Bilateral    cysts removal   CATARACT EXTRACTION W/ INTRAOCULAR LENS  IMPLANT, BILATERAL     PARTIAL HYSTERECTOMY  1982   SURGICAL EXCISION OF EXCESSIVE SKIN Bilateral    x4 1960's and 1970's   THYROIDECTOMY, PARTIAL Right 1984    Social History   Socioeconomic History   Marital status: Widowed    Spouse name: Not on file   Number of children: 2   Years of education: Not on file   Highest education level: Associate degree: occupational, Scientist, product/process development, or vocational program  Occupational History   Not on file  Tobacco Use   Smoking status: Never    Passive exposure: Never   Smokeless tobacco: Never  Vaping Use   Vaping status: Never Used  Substance and Sexual Activity   Alcohol use: No   Drug use: No   Sexual activity: Not Currently    Birth control/protection: Surgical  Other Topics Concern   Not on file  Social History Narrative   Lives alone   Social Drivers of Health   Financial Resource Strain: Low Risk  (06/09/2023)   Overall Financial Resource Strain (CARDIA)    Difficulty of Paying Living Expenses: Not hard at all  Food Insecurity: No Food Insecurity (06/09/2023)   Hunger Vital Sign    Worried About Running Out of Food in the Last Year: Never true    Ran Out of Food in the Last Year: Never true  Transportation Needs: No Transportation Needs (06/09/2023)   PRAPARE - Administrator, Civil Service (Medical): No    Lack of Transportation (Non-Medical): No  Physical Activity: Insufficiently Active (06/09/2023)   Exercise Vital Sign    Days of Exercise per Week: 7 days    Minutes of Exercise per Session: 10 min  Stress: No Stress Concern Present (06/09/2023)   [employer] of Occupational Health - Occupational Stress  Questionnaire    Feeling of Stress : Only a little  Social Connections: Moderately Isolated (06/09/2023)   Social Connection and Isolation Panel    Frequency of Communication with Friends and Family: More than three times a week    Frequency of Social Gatherings with Friends and Family: More than three times a week    Attends Religious Services: More than 4 times per year    Active Member of [employer] or Organizations: No    Attends Banker Meetings: Never    Marital Status: Widowed  Intimate Partner Violence: Not At Risk (06/09/2023)   Humiliation, Afraid, Rape, and Kick questionnaire    Fear of Current or Ex-Partner: No    Emotionally Abused: No    Physically Abused: No    Sexually Abused: No    Family History  Problem Relation Age of Onset   Hypertension Mother    Diabetes Father    Colon cancer Father    Breast cancer Grandchild      Current Outpatient Medications:    aspirin  EC 81 MG tablet, Take 1 tablet (81 mg total) by mouth daily. Swallow  whole., Disp: 100 tablet, Rfl: 12   atorvastatin  (LIPITOR) 20 MG tablet, Take 1 tablet (20 mg total) by mouth daily., Disp: 90 tablet, Rfl: 0   benazepril  (LOTENSIN ) 40 MG tablet, Take 1 tablet (40 mg total) by mouth at bedtime., Disp: 90 tablet, Rfl: 0   Calcium  Carbonate-Vit D-Min (CALCIUM  600+D PLUS MINERALS) 600-400 MG-UNIT TABS, Take 1 tablet by mouth daily at 12 noon., Disp: , Rfl:    clotrimazole -betamethasone  (LOTRISONE ) cream, Apply 1 application topically 2 (two) times daily. (Patient taking differently: Apply 1 application  topically as needed.), Disp: 30 g, Rfl: 0   Desoximetasone (TOPICORT) 0.25 % ointment, Apply 1 Application topically as needed., Disp: , Rfl:    fluticasone  (FLONASE ) 50 MCG/ACT nasal spray, Place into the nose., Disp: , Rfl:    hydrochlorothiazide  (HYDRODIURIL ) 25 MG tablet, Take 1 tablet (25 mg total) by mouth daily., Disp: 90 tablet, Rfl: 0   hydrocortisone  (ANUSOL -HC) 2.5 % rectal cream, Place 1  Application rectally as needed., Disp: 28 g, Rfl: 6   latanoprost  (XALATAN ) 0.005 % ophthalmic solution, Place 1 drop into both eyes at bedtime., Disp: , Rfl: 6   levothyroxine  (SYNTHROID ) 112 MCG tablet, Take 1 tablet (112 mcg total) by mouth daily before breakfast., Disp: 90 tablet, Rfl: 3   Multiple Vitamins-Minerals (CENTRUM SILVER  ULTRA WOMENS PO), Take 1 tablet by mouth daily., Disp: , Rfl:    Omega-3 Fatty Acids (FISH OIL) 1000 MG CAPS, Take 1,000 mg by mouth daily., Disp: , Rfl:    polyethylene glycol powder (GLYCOLAX /MIRALAX ) 17 GM/SCOOP powder, Take 17 g by mouth 2 (two) times daily as needed., Disp: 3350 g, Rfl: 1   timolol  (TIMOPTIC ) 0.5 % ophthalmic solution, Place 1 drop into the left eye daily., Disp: , Rfl:    vitamin C (ASCORBIC ACID) 500 MG tablet, Take 500 mg by mouth daily., Disp: , Rfl:   Physical exam:  Vitals:   09/13/23 1132  BP: (!) 133/54  Pulse: 63  Resp: 19  Temp: 98.9 F (37.2 C)  SpO2: 99%  Weight: 147 lb 11.2 oz (67 kg)   Physical Exam  Cardiovascular:     Rate and Rhythm: Normal rate and regular rhythm.     Heart sounds: Normal heart sounds.  Pulmonary:     Effort: Pulmonary effort is normal.     Breath sounds: Normal breath sounds.  Abdominal:     General: Bowel sounds are normal.   Skin:    General: Skin is warm and dry.   Neurological:     Mental Status: She is alert and oriented to person, place, and time.    Breast exam was performed in seated and lying down position. Patient is status post right lumpectomy with a well-healed surgical scar. No evidence of any palpable masses. No evidence of axillary adenopathy. No evidence of any palpable masses or lumps in the left breast. No evidence of leftt axillary adenopathy   I have personally reviewed labs listed below:    Latest Ref Rng & Units 04/04/2023   10:30 AM  CMP  Glucose 70 - 99 mg/dL 951   BUN 10 - 36 mg/dL 22   Creatinine 8.84 - 1.00 mg/dL 1.66   Sodium 063 - 016 mmol/L 142    Potassium 3.5 - 5.2 mmol/L 4.1   Chloride 96 - 106 mmol/L 102   CO2 20 - 29 mmol/L 25   Calcium  8.7 - 10.3 mg/dL 9.3   Total Protein 6.0 - 8.5 g/dL 6.6   Total  Bilirubin 0.0 - 1.2 mg/dL 0.5   Alkaline Phos 44 - 121 IU/L 68   AST 0 - 40 IU/L 15   ALT 0 - 32 IU/L 14       Latest Ref Rng & Units 04/04/2023   10:30 AM  CBC  WBC 3.4 - 10.8 x10E3/uL 4.6   Hemoglobin 11.1 - 15.9 g/dL 16.1   Hematocrit 09.6 - 46.6 % 33.4   Platelets 150 - 450 x10E3/uL 280      Assessment and plan- Patient is a 89 y.o. female with history of right breast DCIS ER low 1 to 10% positive s/p lumpectomy and adjuvant radiation therapy.  She is here for a routine follow-up visit  Clinically patient is doing well with no concerning signs And symptoms of recurrence based on today's exam.  Her mammogram from April 2025 was unremarkable.  She has low ER +1 to 10% DCIS and therefore not presently on endocrine therapy.  She would like to continue follow-up with me on a yearly basis at this time and therefore I will see her back in 1 year.  We discussed pros and cons of continuing mammograms versus stopping mammograms at her age.  She would like to continue mammograms at this time   Visit Diagnosis 1. Encounter for follow-up surveillance of ductal carcinoma in situ (DCIS) of breast      Dr. Seretha Dance, MD, MPH [facility] [phone] 09/13/2023 4:40 PM

## 2023-09-15 ENCOUNTER — Encounter: Payer: Self-pay | Admitting: *Deleted

## 2023-09-15 DIAGNOSIS — I69320 Aphasia following cerebral infarction: Secondary | ICD-10-CM | POA: Diagnosis not present

## 2023-09-15 DIAGNOSIS — R0609 Other forms of dyspnea: Secondary | ICD-10-CM | POA: Diagnosis not present

## 2023-09-15 DIAGNOSIS — I499 Cardiac arrhythmia, unspecified: Secondary | ICD-10-CM | POA: Diagnosis not present

## 2023-09-15 DIAGNOSIS — I10 Essential (primary) hypertension: Secondary | ICD-10-CM | POA: Diagnosis not present

## 2023-09-15 DIAGNOSIS — N1831 Chronic kidney disease, stage 3a: Secondary | ICD-10-CM | POA: Diagnosis not present

## 2023-09-15 DIAGNOSIS — Z8673 Personal history of transient ischemic attack (TIA), and cerebral infarction without residual deficits: Secondary | ICD-10-CM | POA: Diagnosis not present

## 2023-09-15 DIAGNOSIS — E782 Mixed hyperlipidemia: Secondary | ICD-10-CM | POA: Diagnosis not present

## 2023-09-16 DIAGNOSIS — L570 Actinic keratosis: Secondary | ICD-10-CM | POA: Diagnosis not present

## 2023-09-16 DIAGNOSIS — D2272 Melanocytic nevi of left lower limb, including hip: Secondary | ICD-10-CM | POA: Diagnosis not present

## 2023-09-16 DIAGNOSIS — L309 Dermatitis, unspecified: Secondary | ICD-10-CM | POA: Diagnosis not present

## 2023-09-16 DIAGNOSIS — Z85828 Personal history of other malignant neoplasm of skin: Secondary | ICD-10-CM | POA: Diagnosis not present

## 2023-09-16 DIAGNOSIS — D2261 Melanocytic nevi of right upper limb, including shoulder: Secondary | ICD-10-CM | POA: Diagnosis not present

## 2023-09-16 DIAGNOSIS — D2262 Melanocytic nevi of left upper limb, including shoulder: Secondary | ICD-10-CM | POA: Diagnosis not present

## 2023-09-16 DIAGNOSIS — D225 Melanocytic nevi of trunk: Secondary | ICD-10-CM | POA: Diagnosis not present

## 2023-10-06 ENCOUNTER — Ambulatory Visit: Admitting: Family Medicine

## 2023-10-10 DIAGNOSIS — S8992XA Unspecified injury of left lower leg, initial encounter: Secondary | ICD-10-CM | POA: Diagnosis not present

## 2023-10-10 DIAGNOSIS — W19XXXA Unspecified fall, initial encounter: Secondary | ICD-10-CM | POA: Diagnosis not present

## 2023-10-10 DIAGNOSIS — S6992XA Unspecified injury of left wrist, hand and finger(s), initial encounter: Secondary | ICD-10-CM | POA: Diagnosis not present

## 2023-10-13 DIAGNOSIS — S8992XD Unspecified injury of left lower leg, subsequent encounter: Secondary | ICD-10-CM | POA: Diagnosis not present

## 2023-10-13 DIAGNOSIS — S6992XD Unspecified injury of left wrist, hand and finger(s), subsequent encounter: Secondary | ICD-10-CM | POA: Diagnosis not present

## 2023-10-18 ENCOUNTER — Ambulatory Visit (INDEPENDENT_AMBULATORY_CARE_PROVIDER_SITE_OTHER): Admitting: Family Medicine

## 2023-10-18 VITALS — BP 122/70 | HR 60 | Temp 98.0°F | Ht 63.0 in | Wt 147.6 lb

## 2023-10-18 DIAGNOSIS — S61207A Unspecified open wound of left little finger without damage to nail, initial encounter: Secondary | ICD-10-CM

## 2023-10-18 DIAGNOSIS — I129 Hypertensive chronic kidney disease with stage 1 through stage 4 chronic kidney disease, or unspecified chronic kidney disease: Secondary | ICD-10-CM

## 2023-10-18 DIAGNOSIS — W19XXXA Unspecified fall, initial encounter: Secondary | ICD-10-CM

## 2023-10-18 DIAGNOSIS — N183 Chronic kidney disease, stage 3 unspecified: Secondary | ICD-10-CM | POA: Diagnosis not present

## 2023-10-18 DIAGNOSIS — E785 Hyperlipidemia, unspecified: Secondary | ICD-10-CM | POA: Diagnosis not present

## 2023-10-18 DIAGNOSIS — E039 Hypothyroidism, unspecified: Secondary | ICD-10-CM | POA: Diagnosis not present

## 2023-10-18 MED ORDER — HYDROCHLOROTHIAZIDE 25 MG PO TABS
25.0000 mg | ORAL_TABLET | Freq: Every day | ORAL | 1 refills | Status: AC
Start: 2023-10-18 — End: ?

## 2023-10-18 MED ORDER — BENAZEPRIL HCL 40 MG PO TABS: 40.0000 mg | ORAL_TABLET | Freq: Every day | ORAL | 1 refills | Status: AC

## 2023-10-18 MED ORDER — ATORVASTATIN CALCIUM 20 MG PO TABS: 20.0000 mg | ORAL_TABLET | Freq: Every day | ORAL | 1 refills | Status: DC

## 2023-10-18 NOTE — Assessment & Plan Note (Signed)
 Under good control on current regimen. Continue current regimen. Continue to monitor. Call with any concerns. Refills given. Labs drawn today.

## 2023-10-18 NOTE — Assessment & Plan Note (Signed)
 Rechecking labs today. Await results. Treat as needed.

## 2023-10-18 NOTE — Progress Notes (Signed)
 BP 122/70 (BP Location: Left Arm)   Pulse 60   Temp 98 F (36.7 C) (Oral)   Ht 5' 3 (1.6 m)   Wt 147 lb 9.6 oz (67 kg)   LMP 05/13/1980   SpO2 96%   BMI 26.15 kg/m    Subjective:    Patient ID: Cassie Brewer, female    DOB: 1928-05-10, 89 y.o.   MRN: [id number]  HPI: Cassie Brewer is a 89 y.o. female  Chief Complaint  Patient presents with   Hypertension   Hypothyroidism   Had a fall about a week ago when she stepped off a step ladder and missed the edge. She cut her L pinky finger and her L shin. She is healing well. She has been wrapping it and saw UC. Her bruises are improving. She is concerned that she may have to go to wound care.   HYPOTHYROIDISM Thyroid  control status:controlled Satisfied with current treatment? yes Medication side effects: no Medication compliance: excellent compliance Recent dose adjustment:no Fatigue: no Cold intolerance: no Heat intolerance: no Weight gain: no Weight loss: no Constipation: no Diarrhea/loose stools: no Palpitations: no Lower extremity edema: no Anxiety/depressed mood: no  HYPERTENSION / HYPERLIPIDEMIA Satisfied with current treatment? yes Duration of hypertension: chronic BP monitoring frequency: rarely BP medication side effects: no Past BP meds: hydrochlorothiazide , benazepril  Duration of hyperlipidemia: chronic Cholesterol medication side effects: no Cholesterol supplements: fish oil Past cholesterol medications: atorvastatin  Medication compliance: excellent compliance Aspirin : yes Recent stressors: no Recurrent headaches: no Visual changes: no Palpitations: no Dyspnea: no Chest pain: no Lower extremity edema: no Dizzy/lightheaded: no  Relevant past medical, surgical, family and social history reviewed and updated as indicated. Interim medical history since our last visit reviewed. Allergies and medications reviewed and updated.  Review of Systems  Constitutional: Negative.   Respiratory: Negative.     Cardiovascular: Negative.   Musculoskeletal: Negative.   Neurological: Negative.   Psychiatric/Behavioral: Negative.      Per HPI unless specifically indicated above     Objective:    BP 122/70 (BP Location: Left Arm)   Pulse 60   Temp 98 F (36.7 C) (Oral)   Ht 5' 3 (1.6 m)   Wt 147 lb 9.6 oz (67 kg)   LMP 05/13/1980   SpO2 96%   BMI 26.15 kg/m   Wt Readings from Last 3 Encounters:  10/18/23 147 lb 9.6 oz (67 kg)  09/13/23 147 lb 11.2 oz (67 kg)  09/06/23 147 lb 3.2 oz (66.8 kg)    Physical Exam Vitals and nursing note reviewed.  Constitutional:      General: She is not in acute distress.    Appearance: Normal appearance. She is not ill-appearing, toxic-appearing or diaphoretic.  HENT:     Head: Normocephalic and atraumatic.     Right Ear: External ear normal.     Left Ear: External ear normal.     Nose: Nose normal.     Mouth/Throat:     Mouth: Mucous membranes are moist.     Pharynx: Oropharynx is clear.  Eyes:     General: No scleral icterus.       Right eye: No discharge.        Left eye: No discharge.     Extraocular Movements: Extraocular movements intact.     Conjunctiva/sclera: Conjunctivae normal.     Pupils: Pupils are equal, round, and reactive to light.  Cardiovascular:     Rate and Rhythm: Normal rate and regular rhythm.  Pulses: Normal pulses.     Heart sounds: Normal heart sounds. No murmur heard.    No friction rub. No gallop.  Pulmonary:     Effort: Pulmonary effort is normal. No respiratory distress.     Breath sounds: Normal breath sounds. No stridor. No wheezing, rhonchi or rales.  Chest:     Chest wall: No tenderness.  Musculoskeletal:        General: Normal range of motion.     Cervical back: Normal range of motion and neck supple.  Skin:    General: Skin is warm and dry.     Capillary Refill: Capillary refill takes less than 2 seconds.     Coloration: Skin is not jaundiced or pale.     Findings: No bruising, erythema,  lesion or rash.  Neurological:     General: No focal deficit present.     Mental Status: She is alert and oriented to person, place, and time. Mental status is at baseline.  Psychiatric:        Mood and Affect: Mood normal.        Behavior: Behavior normal.        Thought Content: Thought content normal.        Judgment: Judgment normal.     Results for orders placed or performed in visit on 09/06/23  Urine Culture   Collection Time: 09/06/23  2:24 PM   Specimen: Urine   UR  Result Value Ref Range   Urine Culture, Routine Final report (A)    Organism ID, Bacteria Escherichia coli (A)    Antimicrobial Susceptibility Comment   WET PREP FOR TRICH, YEAST, CLUE   Collection Time: 09/06/23  2:24 PM   Specimen: Sterile Swab   Sterile Swab  Result Value Ref Range   Trichomonas Exam Negative Negative   Yeast Exam Negative Negative   Clue Cell Exam Negative Negative  Microscopic Examination   Collection Time: 09/06/23  2:24 PM  Result Value Ref Range   WBC, UA 0-5 0 - 5 /hpf   RBC, Urine None seen 0 - 2 /hpf   Epithelial Cells (non renal) 0-10 0 - 10 /hpf   Bacteria, UA None seen None seen/Few  Urinalysis, Routine w reflex microscopic   Collection Time: 09/06/23  2:24 PM  Result Value Ref Range   Specific Gravity, UA 1.020 1.005 - 1.030   pH, UA 7.0 5.0 - 7.5   Color, UA Yellow Yellow   Appearance Ur Clear Clear   Leukocytes,UA Trace (A) Negative   Protein,UA Negative Negative/Trace   Glucose, UA Negative Negative   Ketones, UA Negative Negative   RBC, UA Negative Negative   Bilirubin, UA Negative Negative   Urobilinogen, Ur 1.0 0.2 - 1.0 mg/dL   Nitrite, UA Negative Negative   Microscopic Examination See below:       Assessment & Plan:   Problem List Items Addressed This Visit       Endocrine   Hypothyroidism   Rechecking labs today. Await results. Treat as needed.       Relevant Orders   CBC with Differential/Platelet   Comprehensive metabolic panel with GFR    TSH     Genitourinary   Benign hypertensive renal disease - Primary   Under good control on current regimen. Continue current regimen. Continue to monitor. Call with any concerns. Refills given. Labs drawn today.        Relevant Medications   benazepril  (LOTENSIN ) 40 MG tablet   hydrochlorothiazide  (HYDRODIURIL ) 25  MG tablet   Other Relevant Orders   CBC with Differential/Platelet   Comprehensive metabolic panel with GFR   CKD (chronic kidney disease), stage III (HCC)   Rechecking labs today. Await results. Treat as needed.         Other   Hyperlipidemia   Under good control on current regimen. Continue current regimen. Continue to monitor. Call with any concerns. Refills given. Labs drawn today.       Relevant Medications   atorvastatin  (LIPITOR) 20 MG tablet   benazepril  (LOTENSIN ) 40 MG tablet   hydrochlorothiazide  (HYDRODIURIL ) 25 MG tablet   Other Relevant Orders   CBC with Differential/Platelet   Comprehensive metabolic panel with GFR   Lipid Panel w/o Chol/HDL Ratio   Other Visit Diagnoses       Fall, initial encounter       Due to misstep. Healing well. Continue to monitor. Call with any concerns.     Open wound of left little finger       Healing well. Will keep it covered and recheck next week. Will determine if she needs to see wound at that time.        Follow up plan: Return in about 3 months (around 01/18/2024).

## 2023-10-19 ENCOUNTER — Ambulatory Visit: Payer: Self-pay | Admitting: Family Medicine

## 2023-10-19 LAB — TSH: TSH: 0.656 u[IU]/mL (ref 0.450–4.500)

## 2023-10-19 LAB — CBC WITH DIFFERENTIAL/PLATELET
Basophils Absolute: 0.1 x10E3/uL (ref 0.0–0.2)
Basos: 1 %
EOS (ABSOLUTE): 0.2 x10E3/uL (ref 0.0–0.4)
Eos: 3 %
Hematocrit: 34.7 % (ref 34.0–46.6)
Hemoglobin: 11.5 g/dL (ref 11.1–15.9)
Immature Grans (Abs): 0 x10E3/uL (ref 0.0–0.1)
Immature Granulocytes: 0 %
Lymphocytes Absolute: 1 x10E3/uL (ref 0.7–3.1)
Lymphs: 17 %
MCH: 29.9 pg (ref 26.6–33.0)
MCHC: 33.1 g/dL (ref 31.5–35.7)
MCV: 90 fL (ref 79–97)
Monocytes Absolute: 0.6 x10E3/uL (ref 0.1–0.9)
Monocytes: 9 %
Neutrophils Absolute: 4.4 x10E3/uL (ref 1.4–7.0)
Neutrophils: 70 %
Platelets: 297 x10E3/uL (ref 150–450)
RBC: 3.84 x10E6/uL (ref 3.77–5.28)
RDW: 13.5 % (ref 11.7–15.4)
WBC: 6.3 x10E3/uL (ref 3.4–10.8)

## 2023-10-19 LAB — COMPREHENSIVE METABOLIC PANEL WITH GFR
ALT: 12 IU/L (ref 0–32)
AST: 14 IU/L (ref 0–40)
Albumin: 4.3 g/dL (ref 3.6–4.6)
Alkaline Phosphatase: 65 IU/L (ref 44–121)
BUN/Creatinine Ratio: 20 (ref 12–28)
BUN: 21 mg/dL (ref 10–36)
Bilirubin Total: 0.5 mg/dL (ref 0.0–1.2)
CO2: 24 mmol/L (ref 20–29)
Calcium: 10 mg/dL (ref 8.7–10.3)
Chloride: 98 mmol/L (ref 96–106)
Creatinine, Ser: 1.03 mg/dL — ABNORMAL HIGH (ref 0.57–1.00)
Globulin, Total: 2.8 g/dL (ref 1.5–4.5)
Glucose: 112 mg/dL — ABNORMAL HIGH (ref 70–99)
Potassium: 4.2 mmol/L (ref 3.5–5.2)
Sodium: 135 mmol/L (ref 134–144)
Total Protein: 7.1 g/dL (ref 6.0–8.5)
eGFR: 51 mL/min/1.73 — ABNORMAL LOW (ref >59)

## 2023-10-19 LAB — LIPID PANEL W/O CHOL/HDL RATIO
Cholesterol, Total: 158 mg/dL (ref 100–199)
HDL: 67 mg/dL (ref >39)
LDL Chol Calc (NIH): 75 mg/dL (ref 0–99)
Triglycerides: 85 mg/dL (ref 0–149)
VLDL Cholesterol Cal: 16 mg/dL (ref 5–40)

## 2023-10-20 NOTE — Telephone Encounter (Signed)
 Printed and mailed

## 2023-10-28 ENCOUNTER — Encounter: Payer: Self-pay | Admitting: Family Medicine

## 2023-10-28 ENCOUNTER — Ambulatory Visit: Admitting: Family Medicine

## 2023-10-28 VITALS — BP 105/65 | HR 74 | Temp 97.5°F | Ht 63.0 in | Wt 154.4 lb

## 2023-10-28 DIAGNOSIS — S81802D Unspecified open wound, left lower leg, subsequent encounter: Secondary | ICD-10-CM | POA: Diagnosis not present

## 2023-10-28 DIAGNOSIS — S61207A Unspecified open wound of left little finger without damage to nail, initial encounter: Secondary | ICD-10-CM

## 2023-10-28 MED ORDER — MUPIROCIN 2 % EX OINT
1.0000 | TOPICAL_OINTMENT | Freq: Two times a day (BID) | CUTANEOUS | 0 refills | Status: DC
Start: 2023-10-28 — End: 2024-01-24

## 2023-10-28 NOTE — Progress Notes (Signed)
 BP 105/65   Pulse 74   Temp (!) 97.5 F (36.4 C) (Oral)   Ht 5' 3 (1.6 m)   Wt 154 lb 6.4 oz (70 kg)   LMP 05/13/1980   SpO2 95%   BMI 27.35 kg/m    Subjective:    Patient ID: Cassie Brewer, female    DOB: [birth year], 89 y.o.   MRN: [id number]  HPI: Cassie Brewer is a 89 y.o. female  Chief Complaint  Patient presents with   Injury from fall    Left pinky Left shin     Has been doing well. No redness or pain. Has been keeping her wounds dressed. No other concerns.   Relevant past medical, surgical, family and social history reviewed and updated as indicated. Interim medical history since our last visit reviewed. Allergies and medications reviewed and updated.  Review of Systems  Constitutional: Negative.   Respiratory: Negative.    Cardiovascular: Negative.   Musculoskeletal: Negative.   Neurological: Negative.   Psychiatric/Behavioral: Negative.      Per HPI unless specifically indicated above     Objective:    BP 105/65   Pulse 74   Temp (!) 97.5 F (36.4 C) (Oral)   Ht 5' 3 (1.6 m)   Wt 154 lb 6.4 oz (70 kg)   LMP 05/13/1980   SpO2 95%   BMI 27.35 kg/m   Wt Readings from Last 3 Encounters:  10/28/23 154 lb 6.4 oz (70 kg)  10/18/23 147 lb 9.6 oz (67 kg)  09/13/23 147 lb 11.2 oz (67 kg)    Physical Exam Vitals and nursing note reviewed.  Constitutional:      General: She is not in acute distress.    Appearance: Normal appearance. She is not ill-appearing, toxic-appearing or diaphoretic.  HENT:     Head: Normocephalic and atraumatic.     Right Ear: External ear normal.     Left Ear: External ear normal.     Nose: Nose normal.     Mouth/Throat:     Mouth: Mucous membranes are moist.     Pharynx: Oropharynx is clear.  Eyes:     General: No scleral icterus.       Right eye: No discharge.        Left eye: No discharge.     Extraocular Movements: Extraocular movements intact.     Conjunctiva/sclera: Conjunctivae normal.     Pupils: Pupils  are equal, round, and reactive to light.  Cardiovascular:     Rate and Rhythm: Normal rate and regular rhythm.     Pulses: Normal pulses.     Heart sounds: Normal heart sounds. No murmur heard.    No friction rub. No gallop.  Pulmonary:     Effort: Pulmonary effort is normal. No respiratory distress.     Breath sounds: Normal breath sounds. No stridor. No wheezing, rhonchi or rales.  Chest:     Chest wall: No tenderness.  Musculoskeletal:        General: Normal range of motion.     Cervical back: Normal range of motion and neck supple.  Skin:    General: Skin is warm and dry.     Capillary Refill: Capillary refill takes less than 2 seconds.     Coloration: Skin is not jaundiced or pale.     Findings: No bruising, erythema, lesion or rash.     Comments: 1 inch wound on L shin with good granulation tissue, no redness or oozing. Healing  L finger wound, no redness or oozing, 90% healed  Neurological:     General: No focal deficit present.     Mental Status: She is alert and oriented to person, place, and time. Mental status is at baseline.  Psychiatric:        Mood and Affect: Mood normal.        Behavior: Behavior normal.        Thought Content: Thought content normal.        Judgment: Judgment normal.     Results for orders placed or performed in visit on 10/18/23  CBC with Differential/Platelet   Collection Time: 10/18/23  2:20 PM  Result Value Ref Range   WBC 6.3 3.4 - 10.8 x10E3/uL   RBC 3.84 3.77 - 5.28 x10E6/uL   Hemoglobin 11.5 11.1 - 15.9 g/dL   Hematocrit 65.2 65.9 - 46.6 %   MCV 90 79 - 97 fL   MCH 29.9 26.6 - 33.0 pg   MCHC 33.1 31.5 - 35.7 g/dL   RDW 86.4 88.2 - 84.5 %   Platelets 297 150 - 450 x10E3/uL   Neutrophils 70 Not Estab. %   Lymphs 17 Not Estab. %   Monocytes 9 Not Estab. %   Eos 3 Not Estab. %   Basos 1 Not Estab. %   Neutrophils Absolute 4.4 1.4 - 7.0 x10E3/uL   Lymphocytes Absolute 1.0 0.7 - 3.1 x10E3/uL   Monocytes Absolute 0.6 0.1 - 0.9  x10E3/uL   EOS (ABSOLUTE) 0.2 0.0 - 0.4 x10E3/uL   Basophils Absolute 0.1 0.0 - 0.2 x10E3/uL   Immature Granulocytes 0 Not Estab. %   Immature Grans (Abs) 0.0 0.0 - 0.1 x10E3/uL  Comprehensive metabolic panel with GFR   Collection Time: 10/18/23  2:20 PM  Result Value Ref Range   Glucose 112 (H) 70 - 99 mg/dL   BUN 21 10 - 36 mg/dL   Creatinine, Ser 8.96 (H) 0.57 - 1.00 mg/dL   eGFR 51 (L) >40 fO/fpw/8.26   BUN/Creatinine Ratio 20 12 - 28   Sodium 135 134 - 144 mmol/L   Potassium 4.2 3.5 - 5.2 mmol/L   Chloride 98 96 - 106 mmol/L   CO2 24 20 - 29 mmol/L   Calcium  10.0 8.7 - 10.3 mg/dL   Total Protein 7.1 6.0 - 8.5 g/dL   Albumin 4.3 3.6 - 4.6 g/dL   Globulin, Total 2.8 1.5 - 4.5 g/dL   Bilirubin Total 0.5 0.0 - 1.2 mg/dL   Alkaline Phosphatase 65 44 - 121 IU/L   AST 14 0 - 40 IU/L   ALT 12 0 - 32 IU/L  Lipid Panel w/o Chol/HDL Ratio   Collection Time: 10/18/23  2:20 PM  Result Value Ref Range   Cholesterol, Total 158 100 - 199 mg/dL   Triglycerides 85 0 - 149 mg/dL   HDL 67 >60 mg/dL   VLDL Cholesterol Cal 16 5 - 40 mg/dL   LDL Chol Calc (NIH) 75 0 - 99 mg/dL  TSH   Collection Time: 10/18/23  2:20 PM  Result Value Ref Range   TSH 0.656 0.450 - 4.500 uIU/mL      Assessment & Plan:   Problem List Items Addressed This Visit   None Visit Diagnoses       Open wound of left little finger    -  Primary   Healing well. Redressed. OK to leave uncovered tomorrow. Call with any concerns.     Open wound of left lower leg, subsequent  encounter       Healing well. Will send in some bactroban. Call with any concerns.        Follow up plan: Return for As scheduled.   15 minutes spent with patient today

## 2023-12-08 ENCOUNTER — Ambulatory Visit (INDEPENDENT_AMBULATORY_CARE_PROVIDER_SITE_OTHER): Admitting: Podiatry

## 2023-12-08 DIAGNOSIS — B351 Tinea unguium: Secondary | ICD-10-CM

## 2023-12-08 DIAGNOSIS — M79675 Pain in left toe(s): Secondary | ICD-10-CM | POA: Diagnosis not present

## 2023-12-08 DIAGNOSIS — M79674 Pain in right toe(s): Secondary | ICD-10-CM | POA: Diagnosis not present

## 2023-12-08 NOTE — Progress Notes (Signed)
  Subjective:  Patient ID: Cassie Brewer, female    DOB: March 07, 1929,  MRN: [id number]  Chief Complaint  Patient presents with   Nail Problem    RFC   89 y.o. female returns for the above complaint.  Patient presents with thickened elongated mycotic toenails x10.  Patient is especially concerned for right hallux onychomycosis.  Patient not able to debride on herself.  She would like for us  to debride down.  She does not have any secondary complaints. Objective:   There were no vitals filed for this visit. Podiatric Exam: Vascular: dorsalis pedis and posterior tibial pulses are palpable bilateral. Capillary return is immediate. Temperature gradient is WNL. Skin turgor WNL  Sensorium: Normal Semmes Weinstein monofilament test. Normal tactile sensation bilaterally.  However she experiences subjective neuropathic pain to the distal tip of the toes.  Intact sensation Nail Exam: Pt has thick disfigured discolored nails with subungual debris noted bilateral entire nail hallux through fifth toenails.  Ingrown noted to the right hallux lateral border.  Mild pain on palpation. Ulcer Exam: There is no evidence of ulcer or pre-ulcerative changes or infection. Orthopedic Exam: Muscle tone and strength are WNL. No limitations in general ROM. No crepitus or effusions noted. HAV  B/L.  Hammer toes 2-5  B/L. Skin: No Porokeratosis. No infection or ulcers.  Xerosis/psoriasis of the plantar foot with now subjective component of itching..  There is mild cracking noted.  No bleeding noted.  Assessment & Plan:  Patient was evaluated and treated and all questions answered.  Athlete's foot bilateral -Explained to patient the etiology of athlete's foot and various treatment options were discussed.  Given that there is subjective complaint of itching associated with the bottom of the foot I believe she will benefit from Lotrisone  cream.  I have asked her to apply twice a day.  She states understanding.  Neuropathic  pain -doing well  Xerosis/psoriasis -I explained to the patient the etiology of dryness with cracking as well as various treatment options associated with it.  Given that she is doing good with clotrimazole  cream that was given to her by dermatologist and had diagnosed her with psoriasis, I believe she can continue using the cream.  Patient agrees with this plan.  Onychomycosis with pain  -Nails palliatively debrided as below. -Educated on self-care  Procedure: Nail Debridement Rationale: pain  Type of Debridement: manual, sharp debridement. Instrumentation: Nail nipper, rotary burr. Number of Nails: 10  Procedures and Treatment: Consent by patient was obtained for treatment procedures. The patient understood the discussion of treatment and procedures well. All questions were answered thoroughly reviewed. Debridement of mycotic and hypertrophic toenails, 1 through 5 bilateral and clearing of subungual debris. No ulceration, no infection noted.  Return Visit-Office Procedure: Patient instructed to return to the office for a follow up visit 3 months for continued evaluation and treatment.  Franky Blanch, DPM    No follow-ups on file.

## 2023-12-14 DIAGNOSIS — G44209 Tension-type headache, unspecified, not intractable: Secondary | ICD-10-CM | POA: Diagnosis not present

## 2023-12-14 DIAGNOSIS — R4701 Aphasia: Secondary | ICD-10-CM | POA: Diagnosis not present

## 2023-12-14 DIAGNOSIS — R4781 Slurred speech: Secondary | ICD-10-CM | POA: Diagnosis not present

## 2023-12-14 DIAGNOSIS — Z8673 Personal history of transient ischemic attack (TIA), and cerebral infarction without residual deficits: Secondary | ICD-10-CM | POA: Diagnosis not present

## 2023-12-23 ENCOUNTER — Encounter: Payer: Self-pay | Admitting: Family Medicine

## 2024-01-20 ENCOUNTER — Ambulatory Visit: Admitting: Family Medicine

## 2024-01-24 ENCOUNTER — Ambulatory Visit (INDEPENDENT_AMBULATORY_CARE_PROVIDER_SITE_OTHER): Admitting: Family Medicine

## 2024-01-24 ENCOUNTER — Encounter: Payer: Self-pay | Admitting: Family Medicine

## 2024-01-24 VITALS — BP 166/84 | HR 69 | Temp 98.1°F | Ht 63.0 in | Wt 144.8 lb

## 2024-01-24 DIAGNOSIS — I129 Hypertensive chronic kidney disease with stage 1 through stage 4 chronic kidney disease, or unspecified chronic kidney disease: Secondary | ICD-10-CM

## 2024-01-24 DIAGNOSIS — N952 Postmenopausal atrophic vaginitis: Secondary | ICD-10-CM | POA: Diagnosis not present

## 2024-01-24 DIAGNOSIS — R350 Frequency of micturition: Secondary | ICD-10-CM

## 2024-01-24 DIAGNOSIS — N183 Chronic kidney disease, stage 3 unspecified: Secondary | ICD-10-CM

## 2024-01-24 DIAGNOSIS — L731 Pseudofolliculitis barbae: Secondary | ICD-10-CM | POA: Diagnosis not present

## 2024-01-24 DIAGNOSIS — Z23 Encounter for immunization: Secondary | ICD-10-CM

## 2024-01-24 LAB — URINALYSIS, ROUTINE W REFLEX MICROSCOPIC
Bilirubin, UA: NEGATIVE
Glucose, UA: NEGATIVE
Ketones, UA: NEGATIVE
Nitrite, UA: NEGATIVE
Protein,UA: NEGATIVE
Specific Gravity, UA: 1.015 (ref 1.005–1.030)
Urobilinogen, Ur: 1 mg/dL (ref 0.2–1.0)
pH, UA: 7 (ref 5.0–7.5)

## 2024-01-24 LAB — MICROSCOPIC EXAMINATION: Bacteria, UA: NONE SEEN

## 2024-01-24 LAB — BAYER DCA HB A1C WAIVED: HB A1C (BAYER DCA - WAIVED): 5.7 % — ABNORMAL HIGH (ref 4.8–5.6)

## 2024-01-24 MED ORDER — PREMARIN 0.625 MG/GM VA CREA: 1.0000 | TOPICAL_CREAM | Freq: Every day | VAGINAL | 12 refills | Status: AC

## 2024-01-24 MED ORDER — MUPIROCIN 2 % EX OINT: 1.0000 | TOPICAL_OINTMENT | Freq: Two times a day (BID) | CUTANEOUS | 0 refills | Status: AC

## 2024-01-24 NOTE — Assessment & Plan Note (Signed)
 Running high. Will recheck in 6 weeks. Call with any concerns.

## 2024-01-24 NOTE — Progress Notes (Signed)
 BP (!) 166/84 (BP Location: Left Arm, Cuff Size: Normal)   Pulse 69   Temp 98.1 F (36.7 C) (Oral)   Ht 5' 3 (1.6 m)   Wt 144 lb 12.8 oz (65.7 kg)   LMP 05/13/1980   SpO2 95%   BMI 25.65 kg/m    Subjective:    Patient ID: Cassie Brewer, female    DOB: 24-Aug-1928, 89 y.o.   MRN: [id number]  HPI: KEONNA RAETHER is a 89 y.o. female  Chief Complaint  Patient presents with   Urinary Frequency   LUMP Duration: a couple of weeks Location: vulva Onset: gradual Painful: no Discomfort: no Status:  not changing Trauma: no Redness: no Bruising: no Recent infection: no Swollen lymph nodes: no Requesting removal: no History of cancer: no Family history of cancer: no History of the same: no  URINARY SYMPTOMS Duration: chronic Dysuria: no Urinary frequency: yes Urgency: yes Small volume voids: yes Symptom severity: moderate Urinary incontinence: yes Foul odor: no Hematuria: no Abdominal pain: no Back pain: no Suprapubic pain/pressure: no Flank pain: no Fever:  no Vomiting: no Relief with cranberry juice: no Relief with pyridium: no Status: worse Previous urinary tract infection: no Recurrent urinary tract infection: no Sexual activity: No sexually active History of sexually transmitted disease: no Vaginal discharge: no Treatments attempted: increasing fluids    Relevant past medical, surgical, family and social history reviewed and updated as indicated. Interim medical history since our last visit reviewed. Allergies and medications reviewed and updated.  Review of Systems  Constitutional: Negative.   Respiratory: Negative.    Cardiovascular: Negative.   Genitourinary:  Positive for frequency. Negative for decreased urine volume, difficulty urinating, dyspareunia, dysuria, enuresis, flank pain, genital sores, hematuria, menstrual problem, pelvic pain, urgency, vaginal bleeding, vaginal discharge and vaginal pain.  Musculoskeletal: Negative.    Psychiatric/Behavioral: Negative.      Per HPI unless specifically indicated above     Objective:    BP (!) 166/84 (BP Location: Left Arm, Cuff Size: Normal)   Pulse 69   Temp 98.1 F (36.7 C) (Oral)   Ht 5' 3 (1.6 m)   Wt 144 lb 12.8 oz (65.7 kg)   LMP 05/13/1980   SpO2 95%   BMI 25.65 kg/m   Wt Readings from Last 3 Encounters:  01/24/24 144 lb 12.8 oz (65.7 kg)  10/28/23 154 lb 6.4 oz (70 kg)  10/18/23 147 lb 9.6 oz (67 kg)    Physical Exam Vitals and nursing note reviewed.  Constitutional:      General: She is not in acute distress.    Appearance: Normal appearance. She is not ill-appearing, toxic-appearing or diaphoretic.  HENT:     Head: Normocephalic and atraumatic.     Right Ear: External ear normal.     Left Ear: External ear normal.     Nose: Nose normal.     Mouth/Throat:     Mouth: Mucous membranes are moist.     Pharynx: Oropharynx is clear.  Eyes:     General: No scleral icterus.       Right eye: No discharge.        Left eye: No discharge.     Extraocular Movements: Extraocular movements intact.     Conjunctiva/sclera: Conjunctivae normal.     Pupils: Pupils are equal, round, and reactive to light.  Cardiovascular:     Rate and Rhythm: Normal rate and regular rhythm.     Pulses: Normal pulses.     Heart  sounds: Normal heart sounds. No murmur heard.    No friction rub. No gallop.  Pulmonary:     Effort: Pulmonary effort is normal. No respiratory distress.     Breath sounds: Normal breath sounds. No stridor. No wheezing, rhonchi or rales.  Chest:     Chest wall: No tenderness.  Genitourinary:     Comments: Vaginal atrophy Musculoskeletal:        General: Normal range of motion.     Cervical back: Normal range of motion and neck supple.  Skin:    General: Skin is warm and dry.     Capillary Refill: Capillary refill takes less than 2 seconds.     Coloration: Skin is not jaundiced or pale.     Findings: No bruising, erythema, lesion or rash.   Neurological:     General: No focal deficit present.     Mental Status: She is alert and oriented to person, place, and time. Mental status is at baseline.  Psychiatric:        Mood and Affect: Mood normal.        Behavior: Behavior normal.        Thought Content: Thought content normal.        Judgment: Judgment normal.     Results for orders placed or performed in visit on 10/18/23  CBC with Differential/Platelet   Collection Time: 10/18/23  2:20 PM  Result Value Ref Range   WBC 6.3 3.4 - 10.8 x10E3/uL   RBC 3.84 3.77 - 5.28 x10E6/uL   Hemoglobin 11.5 11.1 - 15.9 g/dL   Hematocrit 65.2 65.9 - 46.6 %   MCV 90 79 - 97 fL   MCH 29.9 26.6 - 33.0 pg   MCHC 33.1 31.5 - 35.7 g/dL   RDW 86.4 88.2 - 84.5 %   Platelets 297 150 - 450 x10E3/uL   Neutrophils 70 Not Estab. %   Lymphs 17 Not Estab. %   Monocytes 9 Not Estab. %   Eos 3 Not Estab. %   Basos 1 Not Estab. %   Neutrophils Absolute 4.4 1.4 - 7.0 x10E3/uL   Lymphocytes Absolute 1.0 0.7 - 3.1 x10E3/uL   Monocytes Absolute 0.6 0.1 - 0.9 x10E3/uL   EOS (ABSOLUTE) 0.2 0.0 - 0.4 x10E3/uL   Basophils Absolute 0.1 0.0 - 0.2 x10E3/uL   Immature Granulocytes 0 Not Estab. %   Immature Grans (Abs) 0.0 0.0 - 0.1 x10E3/uL  Comprehensive metabolic panel with GFR   Collection Time: 10/18/23  2:20 PM  Result Value Ref Range   Glucose 112 (H) 70 - 99 mg/dL   BUN 21 10 - 36 mg/dL   Creatinine, Ser 8.96 (H) 0.57 - 1.00 mg/dL   eGFR 51 (L) >40 fO/fpw/8.26   BUN/Creatinine Ratio 20 12 - 28   Sodium 135 134 - 144 mmol/L   Potassium 4.2 3.5 - 5.2 mmol/L   Chloride 98 96 - 106 mmol/L   CO2 24 20 - 29 mmol/L   Calcium  10.0 8.7 - 10.3 mg/dL   Total Protein 7.1 6.0 - 8.5 g/dL   Albumin 4.3 3.6 - 4.6 g/dL   Globulin, Total 2.8 1.5 - 4.5 g/dL   Bilirubin Total 0.5 0.0 - 1.2 mg/dL   Alkaline Phosphatase 65 44 - 121 IU/L   AST 14 0 - 40 IU/L   ALT 12 0 - 32 IU/L  Lipid Panel w/o Chol/HDL Ratio   Collection Time: 10/18/23  2:20 PM  Result  Value Ref Range   Cholesterol,  Total 158 100 - 199 mg/dL   Triglycerides 85 0 - 149 mg/dL   HDL 67 >60 mg/dL   VLDL Cholesterol Cal 16 5 - 40 mg/dL   LDL Chol Calc (NIH) 75 0 - 99 mg/dL  TSH   Collection Time: 10/18/23  2:20 PM  Result Value Ref Range   TSH 0.656 0.450 - 4.500 uIU/mL      Assessment & Plan:   Problem List Items Addressed This Visit       Genitourinary   Benign hypertensive renal disease   Running high. Will recheck in 6 weeks. Call with any concerns.       CKD (chronic kidney disease), stage III (HCC)   Rechecking labs today. Await results. Treat as needed.       Relevant Orders   Basic metabolic panel with GFR   Vaginal atrophy - Primary   Will start her on vaginal estrogen. Call with any concerns.       Other Visit Diagnoses       Ingrown hair       Will treat with mupirocin . Call with any concerns.     Urinary frequency       Likely due to vaginal atrophy. Will treat with estrogen and start pelvic floor PT. Call with any concerns. Follow up 6 weeks.   Relevant Orders   Urinalysis, Routine w reflex microscopic   Bayer DCA Hb A1c Waived   Ambulatory referral to Physical Therapy     Needs flu shot       Flu shot given today.   Relevant Orders   Flu vaccine HIGH DOSE PF(Fluzone Trivalent) (Completed)        Follow up plan: Return in about 6 weeks (around 03/06/2024).

## 2024-01-24 NOTE — Assessment & Plan Note (Signed)
 Will start her on vaginal estrogen. Call with any concerns.

## 2024-01-24 NOTE — Assessment & Plan Note (Signed)
 Rechecking labs today. Await results. Treat as needed.

## 2024-01-25 LAB — BASIC METABOLIC PANEL WITH GFR
BUN/Creatinine Ratio: 29 — ABNORMAL HIGH (ref 12–28)
BUN: 24 mg/dL (ref 10–36)
CO2: 25 mmol/L (ref 20–29)
Calcium: 10.1 mg/dL (ref 8.7–10.3)
Chloride: 99 mmol/L (ref 96–106)
Creatinine, Ser: 0.83 mg/dL (ref 0.57–1.00)
Glucose: 90 mg/dL (ref 70–99)
Potassium: 4.2 mmol/L (ref 3.5–5.2)
Sodium: 139 mmol/L (ref 134–144)
eGFR: 66 mL/min/1.73 (ref >59)

## 2024-01-27 ENCOUNTER — Ambulatory Visit: Payer: Self-pay | Admitting: Family Medicine

## 2024-03-06 ENCOUNTER — Telehealth: Payer: Self-pay

## 2024-03-06 NOTE — Progress Notes (Signed)
   03/06/2024  Patient ID: Cassie Brewer, female   DOB: 1928/05/18, 89 y.o.   MRN: [id number]  This patient is appearing on a report for being at risk of failing the adherence measure for cholesterol (statin) medications this calendar year.   Medication: atorvastatin  20mg  daily  Last fill date: 6/29 for 90 day supply  Patient is now taking 10mg  daily (one-half tablet of 20mg ) due to muscle pain and weakness as instructed by neurology with [facility] 9/17.  Patient sees PCP again tomorrow, so if she is tolerating this dose better, I recommend sending an updated prescription for atorvastatin  10mg  daily to patient's pharmacy, so she does not have to split tablets (and adherence reporting will be correct in the future).  Cassie Brewer, PharmD, DPLA

## 2024-03-07 ENCOUNTER — Encounter: Payer: Self-pay | Admitting: Family Medicine

## 2024-03-07 ENCOUNTER — Ambulatory Visit (INDEPENDENT_AMBULATORY_CARE_PROVIDER_SITE_OTHER): Admitting: Family Medicine

## 2024-03-07 VITALS — BP 120/80 | HR 80 | Wt 140.6 lb

## 2024-03-07 DIAGNOSIS — R350 Frequency of micturition: Secondary | ICD-10-CM | POA: Diagnosis not present

## 2024-03-07 DIAGNOSIS — K5900 Constipation, unspecified: Secondary | ICD-10-CM | POA: Diagnosis not present

## 2024-03-07 DIAGNOSIS — I129 Hypertensive chronic kidney disease with stage 1 through stage 4 chronic kidney disease, or unspecified chronic kidney disease: Secondary | ICD-10-CM | POA: Diagnosis not present

## 2024-03-07 DIAGNOSIS — E785 Hyperlipidemia, unspecified: Secondary | ICD-10-CM | POA: Diagnosis not present

## 2024-03-07 DIAGNOSIS — N952 Postmenopausal atrophic vaginitis: Secondary | ICD-10-CM | POA: Diagnosis not present

## 2024-03-07 LAB — MICROSCOPIC EXAMINATION: Bacteria, UA: NONE SEEN

## 2024-03-07 LAB — URINALYSIS, ROUTINE W REFLEX MICROSCOPIC
Bilirubin, UA: NEGATIVE
Glucose, UA: NEGATIVE
Ketones, UA: NEGATIVE
Leukocytes,UA: NEGATIVE
Nitrite, UA: NEGATIVE
Protein,UA: NEGATIVE
Specific Gravity, UA: 1.015 (ref 1.005–1.030)
Urobilinogen, Ur: 1 mg/dL (ref 0.2–1.0)
pH, UA: 7.5 (ref 5.0–7.5)

## 2024-03-07 MED ORDER — ATORVASTATIN CALCIUM 10 MG PO TABS: 10.0000 mg | ORAL_TABLET | Freq: Every day | ORAL | 1 refills | Status: AC

## 2024-03-07 NOTE — Assessment & Plan Note (Signed)
 Has not started pelvic floor PT yet- will call to see if we can get it scheduled. Call with any concerns. Continue to monitor.

## 2024-03-07 NOTE — Assessment & Plan Note (Signed)
 Better with estrogen, but still not under good control. Will continue and give a bit longer to work. Call with any concerns.

## 2024-03-07 NOTE — Progress Notes (Signed)
 BP 120/80   Pulse 80   Wt 140 lb 9.6 oz (63.8 kg)   LMP 05/13/1980   SpO2 98%   BMI 24.91 kg/m    Subjective:    Patient ID: Cassie Brewer, female    DOB: 07/22/1928, 89 y.o.   MRN: [id number]  HPI: Cassie Brewer is a 89 y.o. female  Chief Complaint  Patient presents with   Hypertension   HYPERTENSION / HYPERLIPIDEMIA Satisfied with current treatment? 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 Having issues with seeing a periodontist due to her insurance.   Has been using the estrogen at home- she notes that it is starting to help. She continues to have some issues with urgency and frequency. She was constipated over Thanksgiving, but she is feeling a little better in the past week. She has not heard from PT yet.   Relevant past medical, surgical, family and social history reviewed and updated as indicated. Interim medical history since our last visit reviewed. Allergies and medications reviewed and updated.  Review of Systems  Constitutional: Negative.   Respiratory: Negative.    Cardiovascular: Negative.   Gastrointestinal:  Positive for constipation. Negative for abdominal distention, abdominal pain, anal bleeding, blood in stool, diarrhea, nausea, rectal pain and vomiting.  Genitourinary:  Positive for frequency and urgency. Negative for decreased urine volume, difficulty urinating, dyspareunia, dysuria, enuresis, flank pain, genital sores,  hematuria, menstrual problem, pelvic pain, vaginal bleeding, vaginal discharge and vaginal pain.  Musculoskeletal: Negative.   Psychiatric/Behavioral: Negative.      Per HPI unless specifically indicated above     Objective:    BP 120/80   Pulse 80   Wt 140 lb 9.6 oz (63.8 kg)   LMP 05/13/1980   SpO2 98%   BMI 24.91 kg/m   Wt Readings from Last 3 Encounters:  03/07/24 140 lb 9.6 oz (63.8 kg)  01/24/24 144 lb 12.8 oz (65.7 kg)  10/28/23 154 lb 6.4 oz (70 kg)    Physical Exam Vitals and nursing note reviewed.  Constitutional:      General: She is not in acute distress.    Appearance: Normal appearance. She is not ill-appearing, toxic-appearing or diaphoretic.  HENT:     Head: Normocephalic and atraumatic.     Right Ear: External ear normal.     Left Ear: External ear normal.     Nose: Nose normal.     Mouth/Throat:     Mouth: Mucous membranes are moist.     Pharynx: Oropharynx is clear.  Eyes:     General: No scleral icterus.       Right eye: No discharge.        Left eye: No discharge.     Extraocular Movements: Extraocular movements intact.     Conjunctiva/sclera: Conjunctivae normal.     Pupils: Pupils are equal, round, and reactive to light.  Cardiovascular:     Rate and Rhythm: Normal rate and regular rhythm.     Pulses: Normal pulses.     Heart sounds: Normal heart sounds. No murmur heard.  No friction rub. No gallop.  Pulmonary:     Effort: Pulmonary effort is normal. No respiratory distress.     Breath sounds: Normal breath sounds. No stridor. No wheezing, rhonchi or rales.  Chest:     Chest wall: No tenderness.  Musculoskeletal:        General: Normal range of motion.     Cervical back: Normal range of motion and neck supple.  Skin:    General: Skin is warm and dry.     Capillary Refill: Capillary refill takes less than 2 seconds.     Coloration: Skin is not jaundiced or pale.     Findings: No bruising, erythema, lesion or rash.  Neurological:      General: No focal deficit present.     Mental Status: She is alert and oriented to person, place, and time. Mental status is at baseline.  Psychiatric:        Mood and Affect: Mood normal.        Behavior: Behavior normal.        Thought Content: Thought content normal.        Judgment: Judgment normal.     Results for orders placed or performed in visit on 01/24/24  Microscopic Examination   Collection Time: 01/24/24 11:47 AM   Urine  Result Value Ref Range   WBC, UA 0-5 0 - 5 /hpf   RBC, Urine 0-2 0 - 2 /hpf   Epithelial Cells (non renal) 0-10 0 - 10 /hpf   Bacteria, UA None seen None seen/Few  Urinalysis, Routine w reflex microscopic   Collection Time: 01/24/24 11:47 AM  Result Value Ref Range   Specific Gravity, UA 1.015 1.005 - 1.030   pH, UA 7.0 5.0 - 7.5   Color, UA Yellow Yellow   Appearance Ur Clear Clear   Leukocytes,UA Trace (A) Negative   Protein,UA Negative Negative/Trace   Glucose, UA Negative Negative   Ketones, UA Negative Negative   RBC, UA Trace (A) Negative   Bilirubin, UA Negative Negative   Urobilinogen, Ur 1.0 0.2 - 1.0 mg/dL   Nitrite, UA Negative Negative   Microscopic Examination See below:   Bayer DCA Hb A1c Waived   Collection Time: 01/24/24 11:47 AM  Result Value Ref Range   HB A1C (BAYER DCA - WAIVED) 5.7 (H) 4.8 - 5.6 %  Basic metabolic panel with GFR   Collection Time: 01/24/24 11:47 AM  Result Value Ref Range   Glucose 90 70 - 99 mg/dL   BUN 24 10 - 36 mg/dL   Creatinine, Ser 9.16 0.57 - 1.00 mg/dL   eGFR 66 >40 fO/fpw/8.26   BUN/Creatinine Ratio 29 (H) 12 - 28   Sodium 139 134 - 144 mmol/L   Potassium 4.2 3.5 - 5.2 mmol/L   Chloride 99 96 - 106 mmol/L   CO2 25 20 - 29 mmol/L   Calcium  10.1 8.7 - 10.3 mg/dL      Assessment & Plan:   Problem List Items Addressed This Visit       Genitourinary   Benign hypertensive renal disease - Primary   Better on recheck. Continue current regimen. Continue current regimen. Continue to  monitor. Call with any concerns.       Vaginal atrophy   Better with estrogen, but still not under good control. Will continue and give a bit longer to work. Call with any concerns.         Other   Hyperlipidemia   Feeling much better  on 10mg  atorvastatin - Rx changed. Continue current regimen. Call with any concerns.       Relevant Medications   atorvastatin  (LIPITOR) 10 MG tablet   Constipation   Has not started pelvic floor PT yet- will call to see if we can get it scheduled. Call with any concerns. Continue to monitor.       Other Visit Diagnoses       Urinary frequency       Rechecking urine today. Has not started pelvic floor PT yet- will call to see if we can get it scheduled. Call with any concerns. Continue to monitor.   Relevant Orders   Urinalysis, Routine w reflex microscopic        Follow up plan: Return in about 6 weeks (around 04/18/2024) for physical.

## 2024-03-07 NOTE — Assessment & Plan Note (Signed)
 Better on recheck. Continue current regimen. Continue current regimen. Continue to monitor. Call with any concerns.

## 2024-03-07 NOTE — Assessment & Plan Note (Signed)
 Feeling much better on 10mg  atorvastatin - Rx changed. Continue current regimen. Call with any concerns.

## 2024-03-07 NOTE — Patient Instructions (Signed)
 [facility] [address] [place] [address] [phone]

## 2024-03-08 ENCOUNTER — Ambulatory Visit: Payer: Self-pay | Admitting: Family Medicine

## 2024-03-08 ENCOUNTER — Ambulatory Visit: Admitting: Podiatry

## 2024-03-08 DIAGNOSIS — B351 Tinea unguium: Secondary | ICD-10-CM | POA: Diagnosis not present

## 2024-03-08 DIAGNOSIS — M79675 Pain in left toe(s): Secondary | ICD-10-CM | POA: Diagnosis not present

## 2024-03-08 DIAGNOSIS — M79674 Pain in right toe(s): Secondary | ICD-10-CM

## 2024-03-08 NOTE — Progress Notes (Signed)
°  Subjective:  Patient ID: Cassie Brewer, female    DOB: Sep 16, 1928,  MRN: [id number]  Chief Complaint  Patient presents with   Nail Problem   89 y.o. female returns for the above complaint.  Patient presents with thickened elongated mycotic toenails x10.  Patient is especially concerned for right hallux onychomycosis.  Patient not able to debride on herself.  She would like for us  to debride down.  She does not have any secondary complaints. Objective:   There were no vitals filed for this visit. Podiatric Exam: Vascular: dorsalis pedis and posterior tibial pulses are palpable bilateral. Capillary return is immediate. Temperature gradient is WNL. Skin turgor WNL  Sensorium: Normal Semmes Weinstein monofilament test. Normal tactile sensation bilaterally.  However she experiences subjective neuropathic pain to the distal tip of the toes.  Intact sensation Nail Exam: Pt has thick disfigured discolored nails with subungual debris noted bilateral entire nail hallux through fifth toenails.  Ingrown noted to the right hallux lateral border.  Mild pain on palpation. Ulcer Exam: There is no evidence of ulcer or pre-ulcerative changes or infection. Orthopedic Exam: Muscle tone and strength are WNL. No limitations in general ROM. No crepitus or effusions noted. HAV  B/L.  Hammer toes 2-5  B/L. Skin: No Porokeratosis. No infection or ulcers.  Xerosis/psoriasis of the plantar foot with now subjective component of itching..  There is mild cracking noted.  No bleeding noted.  Assessment & Plan:  Patient was evaluated and treated and all questions answered.  Athlete's foot bilateral -Explained to patient the etiology of athlete's foot and various treatment options were discussed.  Given that there is subjective complaint of itching associated with the bottom of the foot I believe she will benefit from Lotrisone  cream.  I have asked her to apply twice a day.  She states understanding.  Neuropathic  pain -doing well  Xerosis/psoriasis -I explained to the patient the etiology of dryness with cracking as well as various treatment options associated with it.  Given that she is doing good with clotrimazole  cream that was given to her by dermatologist and had diagnosed her with psoriasis, I believe she can continue using the cream.  Patient agrees with this plan.  Onychomycosis with pain  -Nails palliatively debrided as below. -Educated on self-care  Procedure: Nail Debridement Rationale: pain  Type of Debridement: manual, sharp debridement. Instrumentation: Nail nipper, rotary burr. Number of Nails: 10  Procedures and Treatment: Consent by patient was obtained for treatment procedures. The patient understood the discussion of treatment and procedures well. All questions were answered thoroughly reviewed. Debridement of mycotic and hypertrophic toenails, 1 through 5 bilateral and clearing of subungual debris. No ulceration, no infection noted.  Return Visit-Office Procedure: Patient instructed to return to the office for a follow up visit 3 months for continued evaluation and treatment.  Franky Blanch, DPM    No follow-ups on file.

## 2024-04-26 ENCOUNTER — Ambulatory Visit: Admitting: Family Medicine

## 2024-04-27 ENCOUNTER — Encounter: Admitting: Family Medicine

## 2024-04-30 ENCOUNTER — Other Ambulatory Visit: Payer: Self-pay | Admitting: Oncology

## 2024-04-30 DIAGNOSIS — Z08 Encounter for follow-up examination after completed treatment for malignant neoplasm: Secondary | ICD-10-CM

## 2024-06-07 ENCOUNTER — Ambulatory Visit: Admitting: Podiatry

## 2024-06-12 ENCOUNTER — Encounter: Admitting: Family Medicine

## 2024-06-21 ENCOUNTER — Ambulatory Visit

## 2024-07-10 ENCOUNTER — Encounter

## 2024-09-12 ENCOUNTER — Ambulatory Visit: Admitting: Oncology
